# Patient Record
Sex: Female | Born: 1940 | Race: Black or African American | Hispanic: No | Smoking: Former smoker
Health system: Southern US, Community
[De-identification: ages and names within clinical notes are randomized; demographics above are authoritative.]

## PROBLEM LIST (undated history)

## (undated) DIAGNOSIS — F0280 Dementia in other diseases classified elsewhere without behavioral disturbance: Secondary | ICD-10-CM

## (undated) DIAGNOSIS — I10 Essential (primary) hypertension: Secondary | ICD-10-CM

## (undated) DIAGNOSIS — Z5189 Encounter for other specified aftercare: Secondary | ICD-10-CM

## (undated) DIAGNOSIS — F419 Anxiety disorder, unspecified: Secondary | ICD-10-CM

## (undated) DIAGNOSIS — C50919 Malignant neoplasm of unspecified site of unspecified female breast: Secondary | ICD-10-CM

## (undated) DIAGNOSIS — I639 Cerebral infarction, unspecified: Secondary | ICD-10-CM

## (undated) DIAGNOSIS — E039 Hypothyroidism, unspecified: Secondary | ICD-10-CM

## (undated) DIAGNOSIS — E669 Obesity, unspecified: Secondary | ICD-10-CM

## (undated) DIAGNOSIS — I499 Cardiac arrhythmia, unspecified: Secondary | ICD-10-CM

## (undated) DIAGNOSIS — R011 Cardiac murmur, unspecified: Secondary | ICD-10-CM

## (undated) DIAGNOSIS — R51 Headache: Secondary | ICD-10-CM

## (undated) DIAGNOSIS — G309 Alzheimer's disease, unspecified: Secondary | ICD-10-CM

## (undated) DIAGNOSIS — M1990 Unspecified osteoarthritis, unspecified site: Secondary | ICD-10-CM

## (undated) DIAGNOSIS — G301 Alzheimer's disease with late onset: Secondary | ICD-10-CM

## (undated) DIAGNOSIS — J189 Pneumonia, unspecified organism: Secondary | ICD-10-CM

## (undated) DIAGNOSIS — R0602 Shortness of breath: Secondary | ICD-10-CM

## (undated) DIAGNOSIS — K219 Gastro-esophageal reflux disease without esophagitis: Secondary | ICD-10-CM

## (undated) DIAGNOSIS — D649 Anemia, unspecified: Secondary | ICD-10-CM

## (undated) DIAGNOSIS — F0390 Unspecified dementia without behavioral disturbance: Secondary | ICD-10-CM

## (undated) DIAGNOSIS — M5136 Other intervertebral disc degeneration, lumbar region: Secondary | ICD-10-CM

## (undated) DIAGNOSIS — N189 Chronic kidney disease, unspecified: Secondary | ICD-10-CM

## (undated) DIAGNOSIS — F0281 Dementia in other diseases classified elsewhere with behavioral disturbance: Secondary | ICD-10-CM

## (undated) DIAGNOSIS — J069 Acute upper respiratory infection, unspecified: Secondary | ICD-10-CM

## (undated) DIAGNOSIS — M51369 Other intervertebral disc degeneration, lumbar region without mention of lumbar back pain or lower extremity pain: Secondary | ICD-10-CM

## (undated) DIAGNOSIS — IMO0001 Reserved for inherently not codable concepts without codable children: Secondary | ICD-10-CM

## (undated) DIAGNOSIS — I209 Angina pectoris, unspecified: Secondary | ICD-10-CM

## (undated) HISTORY — DX: Malignant neoplasm of unspecified site of unspecified female breast: C50.919

## (undated) HISTORY — DX: Gastro-esophageal reflux disease without esophagitis: K21.9

## (undated) HISTORY — PX: UTERINE FIBROID SURGERY: SHX826

---

## 2002-02-14 ENCOUNTER — Emergency Department (HOSPITAL_COMMUNITY): Admission: EM | Admit: 2002-02-14 | Discharge: 2002-02-14 | Payer: Self-pay | Admitting: Emergency Medicine

## 2002-02-14 ENCOUNTER — Encounter: Payer: Self-pay | Admitting: Emergency Medicine

## 2007-02-14 ENCOUNTER — Emergency Department (HOSPITAL_COMMUNITY): Admission: EM | Admit: 2007-02-14 | Discharge: 2007-02-14 | Payer: Self-pay | Admitting: Emergency Medicine

## 2007-03-13 ENCOUNTER — Emergency Department (HOSPITAL_COMMUNITY): Admission: EM | Admit: 2007-03-13 | Discharge: 2007-03-13 | Payer: Self-pay | Admitting: Emergency Medicine

## 2007-07-02 ENCOUNTER — Encounter (INDEPENDENT_AMBULATORY_CARE_PROVIDER_SITE_OTHER): Payer: Self-pay | Admitting: Obstetrics and Gynecology

## 2007-07-02 ENCOUNTER — Ambulatory Visit: Payer: Self-pay | Admitting: Family Medicine

## 2007-07-02 ENCOUNTER — Ambulatory Visit: Payer: Self-pay | Admitting: Vascular Surgery

## 2007-07-02 ENCOUNTER — Inpatient Hospital Stay (HOSPITAL_COMMUNITY): Admission: EM | Admit: 2007-07-02 | Discharge: 2007-07-11 | Payer: Self-pay | Admitting: Emergency Medicine

## 2007-07-03 ENCOUNTER — Encounter: Payer: Self-pay | Admitting: Family Medicine

## 2007-07-23 ENCOUNTER — Ambulatory Visit: Payer: Self-pay | Admitting: Psychiatry

## 2008-03-04 ENCOUNTER — Emergency Department (HOSPITAL_COMMUNITY): Admission: EM | Admit: 2008-03-04 | Discharge: 2008-03-04 | Payer: Self-pay | Admitting: Emergency Medicine

## 2008-04-09 ENCOUNTER — Emergency Department (HOSPITAL_COMMUNITY): Admission: EM | Admit: 2008-04-09 | Discharge: 2008-04-09 | Payer: Self-pay | Admitting: Emergency Medicine

## 2008-04-21 ENCOUNTER — Inpatient Hospital Stay (HOSPITAL_COMMUNITY): Admission: EM | Admit: 2008-04-21 | Discharge: 2008-04-28 | Payer: Self-pay | Admitting: Emergency Medicine

## 2008-04-29 ENCOUNTER — Encounter (HOSPITAL_COMMUNITY): Admission: RE | Admit: 2008-04-29 | Discharge: 2008-07-28 | Payer: Self-pay | Admitting: Internal Medicine

## 2008-10-30 ENCOUNTER — Emergency Department (HOSPITAL_COMMUNITY): Admission: EM | Admit: 2008-10-30 | Discharge: 2008-10-30 | Payer: Self-pay | Admitting: Emergency Medicine

## 2009-06-28 ENCOUNTER — Emergency Department (HOSPITAL_COMMUNITY): Admission: EM | Admit: 2009-06-28 | Discharge: 2009-06-28 | Payer: Self-pay | Admitting: Emergency Medicine

## 2009-07-02 ENCOUNTER — Inpatient Hospital Stay (HOSPITAL_COMMUNITY): Admission: EM | Admit: 2009-07-02 | Discharge: 2009-07-06 | Payer: Self-pay | Admitting: Emergency Medicine

## 2009-07-03 ENCOUNTER — Encounter (INDEPENDENT_AMBULATORY_CARE_PROVIDER_SITE_OTHER): Payer: Self-pay | Admitting: Internal Medicine

## 2009-07-03 ENCOUNTER — Ambulatory Visit: Payer: Self-pay | Admitting: Surgery

## 2010-07-20 ENCOUNTER — Encounter (HOSPITAL_COMMUNITY): Admission: RE | Admit: 2010-07-20 | Discharge: 2010-09-12 | Payer: Self-pay | Admitting: Cardiology

## 2010-09-16 ENCOUNTER — Emergency Department (HOSPITAL_COMMUNITY)
Admission: EM | Admit: 2010-09-16 | Discharge: 2010-09-16 | Payer: Self-pay | Source: Home / Self Care | Admitting: Emergency Medicine

## 2011-01-14 ENCOUNTER — Encounter: Payer: Self-pay | Admitting: Sports Medicine

## 2011-03-09 LAB — URINE MICROSCOPIC-ADD ON

## 2011-03-09 LAB — URINALYSIS, ROUTINE W REFLEX MICROSCOPIC
Bilirubin Urine: NEGATIVE
Glucose, UA: NEGATIVE mg/dL
Hgb urine dipstick: NEGATIVE
Ketones, ur: NEGATIVE mg/dL
Protein, ur: NEGATIVE mg/dL
pH: 7.5 (ref 5.0–8.0)

## 2011-03-09 LAB — URINE CULTURE
Colony Count: >=100000
Culture  Setup Time: 201109230910

## 2011-03-09 LAB — DIFFERENTIAL
Eosinophils Absolute: 0.5 10*3/uL (ref 0.0–0.7)
Eosinophils Relative: 6 % — ABNORMAL HIGH (ref 0–5)
Lymphocytes Relative: 21 % (ref 12–46)
Lymphs Abs: 1.8 10*3/uL (ref 0.7–4.0)
Monocytes Absolute: 0.5 10*3/uL (ref 0.1–1.0)

## 2011-03-09 LAB — CBC
HCT: 34.6 % — ABNORMAL LOW (ref 36.0–46.0)
MCH: 28.1 pg (ref 26.0–34.0)
MCV: 88.3 fL (ref 78.0–100.0)
RBC: 3.92 MIL/uL (ref 3.87–5.11)
WBC: 8.3 10*3/uL (ref 4.0–10.5)

## 2011-03-09 LAB — POCT I-STAT, CHEM 8
Calcium, Ion: 1.17 mmol/L (ref 1.12–1.32)
Creatinine, Ser: 1.5 mg/dL — ABNORMAL HIGH (ref 0.4–1.2)
Glucose, Bld: 135 mg/dL — ABNORMAL HIGH (ref 70–99)
Hemoglobin: 11.9 g/dL — ABNORMAL LOW (ref 12.0–15.0)
Potassium: 3.6 mEq/L (ref 3.5–5.1)
TCO2: 24 mmol/L (ref 0–100)

## 2011-04-02 LAB — URINALYSIS, ROUTINE W REFLEX MICROSCOPIC
Bilirubin Urine: NEGATIVE
Glucose, UA: NEGATIVE mg/dL
Hgb urine dipstick: NEGATIVE
Hgb urine dipstick: NEGATIVE
Ketones, ur: NEGATIVE mg/dL
Protein, ur: NEGATIVE mg/dL
Specific Gravity, Urine: 1.012 (ref 1.005–1.030)
Specific Gravity, Urine: 1.015 (ref 1.005–1.030)
Urobilinogen, UA: 0.2 mg/dL (ref 0.0–1.0)
Urobilinogen, UA: 0.2 mg/dL (ref 0.0–1.0)

## 2011-04-02 LAB — DIFFERENTIAL
Basophils Absolute: 0 10*3/uL (ref 0.0–0.1)
Eosinophils Absolute: 0.4 10*3/uL (ref 0.0–0.7)
Eosinophils Absolute: 0.6 10*3/uL (ref 0.0–0.7)
Eosinophils Relative: 5 % (ref 0–5)
Eosinophils Relative: 8 % — ABNORMAL HIGH (ref 0–5)
Lymphocytes Relative: 11 % — ABNORMAL LOW (ref 12–46)
Lymphs Abs: 1.2 10*3/uL (ref 0.7–4.0)

## 2011-04-02 LAB — COMPREHENSIVE METABOLIC PANEL
ALT: 13 U/L (ref 0–35)
AST: 18 U/L (ref 0–37)
CO2: 27 mEq/L (ref 19–32)
Calcium: 9.5 mg/dL (ref 8.4–10.5)
Chloride: 102 mEq/L (ref 96–112)
GFR calc Af Amer: 31 mL/min — ABNORMAL LOW (ref >60)
GFR calc non Af Amer: 25 mL/min — ABNORMAL LOW (ref >60)
Sodium: 140 mEq/L (ref 135–145)
Total Bilirubin: 0.3 mg/dL (ref 0.3–1.2)

## 2011-04-02 LAB — CULTURE, BLOOD (ROUTINE X 2): Culture: NO GROWTH

## 2011-04-02 LAB — TSH: TSH: 3.277 u[IU]/mL (ref 0.350–4.500)

## 2011-04-02 LAB — CBC
HCT: 25.2 % — ABNORMAL LOW (ref 36.0–46.0)
HCT: 25.8 % — ABNORMAL LOW (ref 36.0–46.0)
HCT: 27.7 % — ABNORMAL LOW (ref 36.0–46.0)
Hemoglobin: 8.6 g/dL — ABNORMAL LOW (ref 12.0–15.0)
Hemoglobin: 8.9 g/dL — ABNORMAL LOW (ref 12.0–15.0)
MCHC: 33.3 g/dL (ref 30.0–36.0)
MCHC: 33.4 g/dL (ref 30.0–36.0)
MCHC: 33.5 g/dL (ref 30.0–36.0)
MCV: 87.3 fL (ref 78.0–100.0)
MCV: 87.7 fL (ref 78.0–100.0)
MCV: 87.8 fL (ref 78.0–100.0)
Platelets: 250 10*3/uL (ref 150–400)
Platelets: 296 10*3/uL (ref 150–400)
RBC: 2.96 MIL/uL — ABNORMAL LOW (ref 3.87–5.11)
RBC: 3.07 MIL/uL — ABNORMAL LOW (ref 3.87–5.11)
RBC: 3.15 MIL/uL — ABNORMAL LOW (ref 3.87–5.11)
RDW: 14.2 % (ref 11.5–15.5)
RDW: 14.6 % (ref 11.5–15.5)
WBC: 7.6 10*3/uL (ref 4.0–10.5)
WBC: 8.8 10*3/uL (ref 4.0–10.5)

## 2011-04-02 LAB — BASIC METABOLIC PANEL
BUN: 31 mg/dL — ABNORMAL HIGH (ref 6–23)
CO2: 26 mEq/L (ref 19–32)
CO2: 26 mEq/L (ref 19–32)
CO2: 29 mEq/L (ref 19–32)
Calcium: 9.4 mg/dL (ref 8.4–10.5)
Calcium: 9.6 mg/dL (ref 8.4–10.5)
Chloride: 108 mEq/L (ref 96–112)
Chloride: 110 mEq/L (ref 96–112)
Creatinine, Ser: 1.27 mg/dL — ABNORMAL HIGH (ref 0.4–1.2)
Creatinine, Ser: 1.94 mg/dL — ABNORMAL HIGH (ref 0.4–1.2)
GFR calc Af Amer: 48 mL/min — ABNORMAL LOW (ref >60)
GFR calc Af Amer: 51 mL/min — ABNORMAL LOW (ref >60)
Glucose, Bld: 91 mg/dL (ref 70–99)
Potassium: 3.6 mEq/L (ref 3.5–5.1)
Potassium: 4.5 mEq/L (ref 3.5–5.1)
Sodium: 140 mEq/L (ref 135–145)
Sodium: 141 mEq/L (ref 135–145)

## 2011-04-02 LAB — URINE MICROSCOPIC-ADD ON

## 2011-04-02 LAB — POCT I-STAT, CHEM 8
HCT: 28 % — ABNORMAL LOW (ref 36.0–46.0)
Hemoglobin: 9.5 g/dL — ABNORMAL LOW (ref 12.0–15.0)
Sodium: 140 mEq/L (ref 135–145)
TCO2: 26 mmol/L (ref 0–100)

## 2011-04-02 LAB — URINE CULTURE: Culture: NO GROWTH

## 2011-04-02 LAB — PROTIME-INR: Prothrombin Time: 14.2 seconds (ref 11.6–15.2)

## 2011-05-09 NOTE — Discharge Summary (Signed)
NAMEMASIAH, LEWING                   ACCOUNT NO.:  [id number]   MEDICAL RECORD NO.:  [id number]          PATIENT TYPE:  INP   LOCATION:  5150                         FACILITY:  [facility]   PHYSICIAN:  Theodosia Paling, MD    DATE OF BIRTH:  06-Nov-1941   DATE OF ADMISSION:  07/02/2009  DATE OF DISCHARGE:  07/06/2009                               DISCHARGE SUMMARY   PRIMARY CARE PHYSICIAN:  Della Goo, M.D.   ADMITTING HISTORY:  Please refer to her excellent admission note  dictated under history of present illness.   DISCHARGE DIAGNOSES:  1. Acute renal insufficiency, currently resolving.  2. Possible gout.  3. Hypertension.   SECONDARY DIAGNOSES:  1. History of arthritis.  2. History of hypertension.  3. History of gastroesophageal reflux disease.  4. History of dementia.   PROCEDURE PERFORMED:  None.   CONSULTATION PERFORMED:  None.   IMAGING PERFORMED:  1. X-ray of the bilateral feet showing no acute bony pathology or      chronic changes.  2. Bilateral venous duplex showing negative for DVT.   HOSPITAL COURSE:  Following issues were addressed during the  hospitalization.  1. Hypertension.  The patient was admitted for hypertension.      Panculture was performed which was actually negative.  Assessment      was most likely it is a combination of dehydration and Lasix and      other antihypertensive medication.  The patient's blood pressure is      now stable at 129.  At the time of discharge it is 129/80 and the      only antihypertensive on board is metoprolol currently.  The      patient did not have any complication from hypertension on day #1.  2. Gout.  The patient developed bilateral swelling in right foot, in      particular was very warm and swollen and hot.  Therefore,      presumptive diagnosis of gout was made.  X-ray was performed on the      patient's request, which was negative for any bony pathology.  The      patient will be finishing the tapering of  prednisone.  She will      follow up with Dr. Della Goo for further clinical followup.  3. Acute renal insufficiency.  The patient on admission had a      creatinine of 1.9 which is down to 1.3 today.  She did not have any      electrolyte or volume disturbances, most likely it was a      combination of NSAID, Lasix and ACE inhibitor.  Lasix and ACE      inhibitor are on hold for now.  So, the patient will get a followup      BNP in 4 days' time.  She is instructed to call Dr. Lovell Sheehan.   DISPOSITION:  The patient will follow with Dr. Della Goo in 4  days' time with a followup of basic metabolic panel.   Total time spent in discharge of this patient around  1 hour.      Theodosia Paling, MD  Electronically Signed     NP/MEDQ  D:  07/06/2009  T:  07/07/2009  Job:  [id number]   cc:   Della Goo, M.D.

## 2011-05-09 NOTE — Discharge Summary (Signed)
Katherine Cooper, Katherine Cooper                   ACCOUNT NO.:  [id number]   MEDICAL RECORD NO.:  [id number]          PATIENT TYPE:  WOC   LOCATION:  WOC                          FACILITY:  [facility]   PHYSICIAN:  Ardeen Garland, MD       DATE OF BIRTH:  1941-11-03   DATE OF ADMISSION:  07/02/2007  DATE OF DISCHARGE:  07/10/2007                               DISCHARGE SUMMARY   Date of Admission: July 02, 2007  Date of Discharge: July 17th, 2008   PRIMARY CARE PHYSICIAN:  Dr. Pecola Leisure at the [facility].   CONSULTANTS:  Dr. Electa Sniff of Psychiatry.   REASON FOR ADMISSION:  Altered mental status and acute renal failure.   DISCHARGE DIAGNOSES:  1. Dementia.  2. Acute renal failure.  3. Anemia.  4. Hypercalcemia.   DISCHARGE MEDICATIONS:  1. Aspirin 325 mg daily.  2. Protonix 40 mg daily.  3. Colace 100 mg daily.  4. Lorazepam 0.5 mg at bedtime for 7 days and eventually stopped.  5. Multivitamin daily.   HOSPITAL COURSE:  The patient was admitted on July 02, 2007, for acute  altered mental status.  She is a 70 year old, African-American woman  with a past medical history of hypertension, tobacco abuse, and alcohol  abuse who was brought to the Emergency Room by her family with 3 days of  altered mental status and slurred speech.  She was also believed to have  a left-sided facial droop, and her family states that she had been very  forgetful and been saying things that made no sense.  In the ED, she  claimed to be seeing bugs crawling on the wall.  Her family says prior  to this episode that she was very sharp and really had no history of  dementia.  She has not been recently ill and had not recently started  any medications.  She had been taking NSAIDs for arthritis.  She did  have a heavy drinking history, a 12-pack of beer over 1-2 days; however,  she had quit 3-4 weeks prior to admission to the hospital for an unknown  reason.  She had no previous history of alcohol withdrawal  or DTs.  On  admission, her hemoglobin was 10.7.  White blood cells were 8.  Her PT  was 14.6, INR 1.1.  Her potassium was 3.2, and her creatinine was found  to be 3.67.  A UDS was positive for benzos.  She had apparently been  taking diazepam for an unknown reason.  During her hospital stay, she  received fluids and a Foley to help correct her acute renal failure.  She was also given potassium to correct her low potassium.  She  continued to remain delirious for several days, waxing and waning  generally becoming combative and confused in the late afternoon.  She  was requiring p.r.n. Haldol in the afternoon and did have to be  restrained twice in the early days of her hospitalization.  Though she  had a past history of hypertension, the family did not bring any pill  bottles  with hypertensive medicines in them, and so, we did not start  any while she was at the hospital.  Her Foley was discontinued on July 04, 2007.  We had finished taking the 24-hour urine creatinine, and her  creatinine had been resolving.  On the 9th, it was down to 1.67.  On the  10th, down to 1.23.  It was 1.09 on the 14th and 1.06 on the day of  discharge.  She was also found to have low albumin and a calcium of  10.6.  10.6 in itself is a mildly high calcium, however, if you correct  for her low albumin, her calcium is even higher in the mid 11s.  A PTH  was drawn which was normal.  An ionized calcium was elevated.  A  calcitriol which is 125-dihydroxyvitamin D3 level is pending at this  time.  Her delirium did seem to resolve by about the morning of July 05, 2007.  The regimen was changed to only receiving Haldol as needed in the  evening for sundowning, and Ativan was given only at night.  A psych  consult was ordered, and psychiatry came to see her on July 05, 2007.  It was Dr. Electa Sniff.  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  He did not recommend any new meds for her at this time other  than what we already had her on.  He also thought her acute delirium  could be related to acute alcohol or benzodiazepine withdrawal and  stated that if that were the case it would be resolving about that time  which it was.  She remained to be in fairly stable mental status with  some new onset baseline dementia.  Her speech became much clearer though  she did have a left facial droop in the ER that went away, and a CT was  done which did not show any sign of stroke.  She is oriented to person,  place, and time for the last 3 days.  However, she does occasionally  still tell stories that do not make sense or seem to be inappropriate  for the time.  This leads [place] to believe she may have some new onset  baseline dementia.  Her condition at the time of discharge is stable.  She did have a fever 1 day prior to discharge to 101.9.  A UA was drawn  but nothing suspicious was seen, and the patient was asymptomatic.  She  has been afebrile now overnight and is in good spirits feeling well.  No  antibiotic will be prescribed at this time.  The patient will take  Tylenol if she should get a mild temperature after discharge.  Pending  tests at the time of discharge, we will order a set of a.m. labs this  morning as she had not had them drawn yet just to make sure white blood  cells are not trending up from the fever yesterday, and a BMP to check a  final creatinine before she leaves.   DISPOSITION:  She was discharged home with her family.  She was unable  to be placed in an ALF as all that came to evaluate her denied her  secondary to her drinking history.  She will be receiving home health  PT/OT and home health social work, who will be assisting the family in  continuing to find an assisted living facility  for her.   DISCHARGE FOLLOWUP:  She does have an appointment with her primary care  physician, Dr. Pecola Leisure, at the  [facility] for  Wednesday, July 30, at 11 a.m.   FOLLOWUP ISSUES:  Due to her elevated calcium in the hospital, we were  worried if it is primary hypoparathyroid versus a malignancy, we would  suggest that a calcium, phosphorus, magnesium, and a PTH level be  redrawn at the time of her followup to further clarify the reason for  her hypocalcemia.  A CBC and BMP would also be suggested to check the  status of her hemoglobin and renal function.  Hemoglobin stayed stable  in the mid 8 throughout her hospital stay, and renal function had  improved to baseline at the time of discharge.      Ardeen Garland, MD  Electronically Signed     LM/MEDQ  D:  07/10/2007  T:  07/10/2007  Job:  [id number]   cc:   Jocelyn Lamer D. Pecola Leisure, M.D.  Anselm Jungling, MD

## 2011-05-09 NOTE — Discharge Summary (Signed)
NAMEJASHAYLA, Katherine Cooper                   ACCOUNT NO.:  [id number]   MEDICAL RECORD NO.:  [id number]          PATIENT TYPE:  INP   LOCATION:  5508                         FACILITY:  [facility]   PHYSICIAN:  Betti D. Pecola Leisure, M.D.   DATE OF BIRTH:  May 02, 1941   DATE OF ADMISSION:  04/21/2008  DATE OF DISCHARGE:  04/28/2008                               DISCHARGE SUMMARY   DISCHARGE DIAGNOSES:  1. Dementia.  2. Hypertension.  3. Congestive heart failure.  4. Low back pain.  5. Anemia.   ADMISSION DIAGNOSES:  1. Confusion.  2. Dementia.  3. Hypertension.  4. Anemia.   REASON FOR ADMISSION:  A 70 year old African American female with  history of hypertension, CHF, lower back pain and dementia for the past  year to year and a half.  Was brought to the [facility] for  increasing confusion.  The patient's family thought that she was at  increased risk for hurting herself at home.  Her dementia symptoms had  worsened and they became concerned and had no other place to take her.  The patient has had several episodes of falling at home without any  known injury to the cranium or any other area of her body.  She had had  no colds, no other acute illnesses.  The patient was admitted to [facility] for nursing home placement.   REVIEW OF SYSTEMS:  Noncontributory.   Her medical history consisted of those things mentioned above.   PAST SURGICAL HISTORY:  Noncontributory.   MEDICATIONS:  1. Ibuprofen 800 mg.  2. Zantac over-the-counter.  3. Diovan/HCT 160/12.5 mg once a day.  4. Toprol XL 100 mg once a day.  5. Lasix 20 mg once a day.  6. Clorazepate 7.5 mg three times daily.  7. Aricept 10 mg one daily.  8. Seroquel 25 mg at bedtime.  9. Again, she was on fexofenadine 60 mg daily.   The patient's hospital course was unremarkable.  The patient did show  some confusion at the onset of her hospitalization in the first 24-36  hours.  That condition did improve.  Her mental cognition did  improve  after 2 units of transfused packed red blood cells, IV fluids and proper  nourishment.  Her admission hemoglobin was 7.8% and declined to 6.4.  At  that time the patient was transfused 2 units of packed red blood cells.  She had no breathing difficulty, no chest pain or other issues at that  time.  The patient did have known renal insufficiency with an elevated  creatinine.  Upon admission her creatinine was 3.38, followed by a  lowering of that creatinine with IV rehydration to a value more normal  of 1.73, followed by a value of 1.84, followed by a value of 1.25.  The  patient did have a noted temperature, which her T-max during this  hospitalization was 101.5, which was treated with Tylenol.  Blood  cultures were obtained x2, which shows no evidence of any growth for the  past 48 hours.  The patient's urine was not significant  for any urinary  tract infection as well.  Family did decide on a place for long-term  care, which the patient will start off at [facility].  On her  discharge from the hospital the patient is stable.  Mental cognition  appears to be stable as well.  Her vitals are stable with a systolic  blood pressure of 130/83, a heart rate of 87, respirations 16,  temperature of 98.2.  FAO2 is 99% on room air.  The patient has a  hemoglobin of 8.2 and a hematocrit of 24.5.  Sodium today is 139,  potassium 3.5, chloride is 113, carbon dioxide is 21, BUN is 6,  creatinine is 1.06, glucose 82.  Calcium is 9.1.  The patient will be  discharged to [facility] on today.  She is to be followed up  with physical therapy while at [facility].  If there are any  primary care physicians who are contracted with that facility, she is to  be serviced by them as well.           ______________________________  Isidoro Donning. Pecola Leisure, M.D.     BDR/MEDQ  D:  04/28/2008  T:  04/28/2008  Job:  [id number]

## 2011-05-09 NOTE — H&P (Signed)
Katherine Cooper, Katherine Cooper                   ACCOUNT NO.:  [id number]   MEDICAL RECORD NO.:  [id number]          PATIENT TYPE:  OBV   LOCATION:  1843                         FACILITY:  [facility]   PHYSICIAN:  Della Goo, M.D. DATE OF BIRTH:  [birth year]   DATE OF ADMISSION:  07/02/2009  DATE OF DISCHARGE:                              HISTORY & PHYSICAL   CHIEF COMPLAINT:  Swelling, right lower leg.   HISTORY OF PRESENT ILLNESS:  This is a 70 year old female who was seen  in my office for complaints of increased swelling in her right lower leg  for the past 7 days.  The patient states she also had complaints of  urinary frequency and increased pain.  She went to the emergency  department on June 28, 2009, was seen and evaluated then and diagnosed  with a urinary tract infection.  She was placed on Macrobid therapy  twice daily for 7 days and then was given pain medication to help  control her pain.  The patient was seen in my office today and had  prominent edema 2+ in the right lower leg and also was found to be  hypotensive with blood pressures of 96/60.  The patient was referred to  the emergency department for further workup and evaluation.  The patient  denies having any trauma or recent falls.  She denies having any fevers,  chills.  She denies having any chest pain.   PAST MEDICAL HISTORY:  Significant for hypertension, arthritis,  gastroesophageal reflux disease, dementia.   MEDICATIONS AT THIS TIME:  1. Aricept 10 mg 1 p.o. daily.  2. Namenda 10 mg 1 p.o. b.i.d.  3. Diovan 160/12.5 one p.o. daily.  4. Fexofenadine 60 mg 1 p.o. daily.  5. Furosemide 20 mg 1 p.o. daily.  6. Potassium chloride 20 mEq 1 p.o. daily.  7. Metoprolol tartrate 100 mg 1 p.o. daily.  8. Oxybutynin 5 mg 1 p.o. b.i.d.   ALLERGIES:  SULFA.   SOCIAL HISTORY:  The patient lives alone.  She is a nonsmoker,  nondrinker and no history of illicit drug usage.   FAMILY HISTORY:  Noncontributory.   PHYSICAL  EXAMINATION FINDINGS:  GENERAL:  This is a 70 year old elderly  confused female in discomfort but no acute distress.  VITAL SIGNS:  Temperature 99.3, blood pressure 78/58, heart rate 75,  respirations 12, and O2 saturations 96%.  HEENT:  Normocephalic, atraumatic.  Pupils equally round, reactive to  light.  Extraocular movements are intact.  Funduscopic benign.  Oropharynx is clear.  NECK:  Supple, full range of motion.  No thyromegaly, adenopathy, or  jugular venous distention.  CARDIOVASCULAR:  Regular rate and rhythm.  No murmurs, gallops or rubs.  LUNGS:  Clear to auscultation bilaterally.  ABDOMEN:  Positive bowel sounds, soft, nontender, nondistended.  EXTREMITIES:  Without cyanosis, clubbing except there is 2+ edema on the  right lower extremity.  The edema is pitting.  NEUROLOGIC:  The patient  is mildly confused, but she is alert and oriented x3.  Cranial nerves  are intact.  Motor and sensory function also intact.  ASSESSMENT:  A 70 year old female being admitted with the following:  1. Hypotension.  2. Unilateral edema of the right lower extremity; rule out deep venous      thrombosis.  3. Partially treated urinary tract infection.  4. Anemia.   PLAN:  The patient will be admitted for 24-hour observation.  The  patient will be hydrated with IV fluids, and a workup to rule out a deep  venous thrombosis has been initiated.  The patient will be sent for a  venous duplex ultrasound of her right lower leg to evaluate for possible  deep venous thrombosis.  Her blood pressure medications will be held  secondary to her hypotension, and fluid resuscitation has been ordered.  The patient will be placed on DVT prophylaxis for now.  However, this  will be changed if the results return positive for deep venous  thrombosis.  GI prophylaxis has also been ordered.  The patient's  medications will be further reviewed and continued otherwise except for  her blood pressure medications for  now.      Della Goo, M.D.  Electronically Signed     HJ/MEDQ  D:  07/02/2009  T:  07/02/2009  Job:  [id number]

## 2011-05-09 NOTE — H&P (Signed)
NAMEAMELIYA, Katherine Cooper                   ACCOUNT NO.:  [id number]   MEDICAL RECORD NO.:  [id number]          PATIENT TYPE:  INP   LOCATION:  2034                         FACILITY:  [facility]   PHYSICIAN:  Santiago Bumpers. Hensel, M.D.DATE OF BIRTH:  18-Mar-1941   DATE OF ADMISSION:  07/02/2007  DATE OF DISCHARGE:                              HISTORY & PHYSICAL   DATE OF ADMISSION:  July 02, 2007   PRIMARY CARE PHYSICIAN:  Betti D. Pecola Leisure, M.D., and she will be admitted  to the [facility] Medicine Teaching Service as an unassigned patient.   CHIEF COMPLAINT:  Slurred speech and altered mental status.   HISTORY OF PRESENT ILLNESS:  The patient is 70 year old African American  female with past medical history of hypertension, tobacco abuse and  alcohol abuse who presented to the emergency department today with 3  days of altered mental status and slurred speech.  She was also noted to  have a left-sided facial droop today.  Over the past 3 days, she been  very forgetful, such as not remembering where she put her dentures and  has been saying things that according to her grandson and daughter that  did not make any sense.  In the emergency department tonight, she says  she was seeing bugs on the walls.  Her family says that she is normally  very sharp and has no history of dementia.  No recent illnesses her new  medications.  She does take Vicodin and ibuprofen for arthritis as well  as diazepam for reasons unknown to her daughter.  She also has a history  of heavy drinking, drinking a 12-pack of beer every 1-2 days, although  she states she quit 3 or 4 weeks ago.  She denies any other drug use.  She has no history of alcohol withdrawal or DTs.  She denies any  headaches, blurry vision, difficulty swallowing, or focal weakness.  She  normally ambulates with a cane, but her grandson and daughter do note  that she has been having more falls in the past couple of months.   PAST MEDICAL HISTORY:  1. GERD.  2. Hypertension.  3. Arthritis in the low back and knees.   HOME MEDICATIONS:  1. Clorazepate 7.5 mg t.i.d.  2. Ibuprofen 100 mg p.o. p.r.n.  3. Pepcid 20 mg p.o. b.i.d.  4. Vicodin 5/500 p.r.n.  5. Diazepam 5 mg p.r.n.  6. Medications for hypertension.  The daughters does not have those      with her currently.  She is uncertain what they are.   ALLERGIES:  SULFA.   FAMILY HISTORY:  Two brothers with lung cancer, one brother with stomach  cancer, one brother with prostate cancer.  Mom with diabetes.   SOCIAL HISTORY:  She lives with her grandson.  Her daughter, [name],  also lives in the area.  She is widowed for the past 41 years, as her  husband died in a MVA.  She has five children and many grandchildren.  Alcohol use as above.  She does have an extensive smoking history but  quit 7  months ago.  She denies any other drugs.  She is a retired  Copy.   REVIEW OF SYSTEMS:  Positive chest pain which she mentioned to her son  once, although she currently is denying this.  She denies any shortness  of breath, nausea, vomiting, diarrhea, lightheadedness, or fevers.  The  remainder as per HPI.   PHYSICAL EXAMINATION:  VITAL SIGNS:  Temperature 99.1, pulse 89, blood  pressure 119/82, respiratory rate 20, 97% on room air.  GENERAL:  She is alert and oriented x3.  She has difficulty remaining  focused, and her speech is somewhat slurred.  HEENT:  Pupils equally round and reactive to light and accommodation.  Extraocular motions intact.  Mucous membranes are moist.  No facial  droop noted.  NECK:  No carotid bruits.  CARDIOVASCULAR:  Regular rate and rhythm.  No murmurs, rubs or gallops.  LUNGS:  Clear to auscultation bilaterally.  ABDOMEN:  Positive bowel sounds, soft, nontender, nondistended.  No  hepatosplenomegaly.  EXTREMITIES:  No cyanosis, clubbing, or edema.  2+ dorsalis pedis  pulses.  NEUROLOGIC:  Alert and oriented x3.  Cranial nerves II-XII grossly  intact.  5/5  strength throughout.  Finger-to-nose intact.  Sensation  intact to light touch.  2+ DTRs.  Negative Romberg, negative asterixis.  Positive dysarthria.  Normal bedside swallow evaluation.   LABORATORY DATA:  White blood cell count 8.0, hemoglobin 10.2,  hematocrit 30.3, MCV 84.  PT 14.6, INR 1.1, PTT 36.  Sodium 140,  potassium 3.2, chloride 108, bicarb 19, BUN 55, creatinine 3.67, glucose  84, AST 31, ALT 19, total protein 88.0, albumin 3.6.  CK 272, CK-MB 2.9,  troponin 0.02.  Urinalysis negative for nitrite, leukocyte esterase,  protein, and blood.  Urine drug screen positive for benzodiazepine.   IMAGING:  CT head showed mild atrophy but no acute intracranial  abnormalities.  Chest x-ray showed borderline heart size, moderate  hiatal hernia, no acute findings.  EKG showed normal sinus rhythm  without any ST-segment changes.   ASSESSMENT/PLAN:  70 year old female with confusion and dysarthria.  1. Altered mental status.  Concern for cerebrovascular accident with      dysarthria and questionable history of facial droop.  She does have      a significant alcohol history as well as taking medications such as      Vicodin and diazepam that can cause confusion.  Family is unaware      of how much she has been taking.  Will check 2-D echocardiogram,      carotid Dopplers, and consider MRI to complete cerebrovascular      accident workup.  Will risk stratify her by checking fasting lipid      panel.  We will start aspirin.  We will also check TSH and      pneumonia to rule out other causes of altered mental status.  2. Acute renal failure.  The patient gives no history of renal      failure.  Question what her underlying baseline is.  She has been      taking a lot of ibuprofen, although her daughter is uncertain how      much.  Urinalysis is negative for protein and blood.  Will check a      FENa and follow strict ins and outs.  Foley catheter is in place.      If her renal function does  not improve quickly, then she will      likely need a  renal ultrasound as well as chronic renal disease      workup.  3. Anemia.  Stool Hemoccult is currently pending.  Will check iron      panel, folate, B12, and ferritin.  Possibly anemia of chronic      disease, particularly if renal failure is chronic.  4. Chest pain.  The patient denies any chest pain currently.  She had      normal EKG and one set of normal enzymes.  Will check a second set      of cardiac enzymes to rule out for myocardial infarction.  5. Fluids and electrolytes.  Normal bedside swallow.  Will start renal      diet and follow potassium.  6. Gastroesophageal reflux disease/hiatal hernia.  We will Protonix      while here in the hospital.  7. Frequent falls.  Will consult physical therapy and occupational      therapy.  8. Alcohol abuse.  Start thiamine and multivitamin.  Will check      alcohol level.  The patient states she has quit and does      rehabilitation at this point.  9. Prophylaxis.  Use Lovenox for deep venous thrombosis prophylaxis.  10.Hypertension.  Will obtain the patient's home blood pressure      medication regimen in the morning.      Benn Moulder, M.D.  Electronically Signed      Santiago Bumpers. Leveda Anna, M.D.  Electronically Signed    MR/MEDQ  D:  07/02/2007  T:  07/02/2007  Job:  [id number]   cc:   Jocelyn Lamer D. Pecola Leisure, M.D.

## 2011-09-18 LAB — URINE MICROSCOPIC-ADD ON

## 2011-09-18 LAB — URINALYSIS, ROUTINE W REFLEX MICROSCOPIC
Bilirubin Urine: NEGATIVE
Glucose, UA: NEGATIVE
Hgb urine dipstick: NEGATIVE
Protein, ur: NEGATIVE
Urobilinogen, UA: 0.2

## 2011-09-18 LAB — URINE CULTURE: Colony Count: 35000

## 2011-09-19 LAB — IRON AND TIBC
Iron: <10 — ABNORMAL LOW
UIBC: 270

## 2011-09-19 LAB — BASIC METABOLIC PANEL
BUN: 26 — ABNORMAL HIGH
CO2: 21
CO2: 26
Calcium: 10.2
Chloride: 105
Chloride: 111
Creatinine, Ser: 1.73 — ABNORMAL HIGH
GFR calc Af Amer: 17 — ABNORMAL LOW
GFR calc Af Amer: 36 — ABNORMAL LOW
GFR calc non Af Amer: 29 — ABNORMAL LOW
Glucose, Bld: 105 — ABNORMAL HIGH
Glucose, Bld: 118 — ABNORMAL HIGH
Potassium: 4
Sodium: 139
Sodium: 143

## 2011-09-19 LAB — DIFFERENTIAL
Basophils Absolute: 0.1
Basophils Relative: 1
Eosinophils Absolute: 0.1
Eosinophils Absolute: 0.6
Eosinophils Relative: 2
Eosinophils Relative: 7 — ABNORMAL HIGH
Lymphocytes Relative: 12
Monocytes Absolute: 0.4
Monocytes Relative: 5

## 2011-09-19 LAB — COMPREHENSIVE METABOLIC PANEL
ALT: <8
AST: 12
CO2: 21
Chloride: 101
Creatinine, Ser: 3.38 — ABNORMAL HIGH
GFR calc Af Amer: 16 — ABNORMAL LOW
GFR calc non Af Amer: 14 — ABNORMAL LOW
Total Bilirubin: 0.7

## 2011-09-19 LAB — CBC
Hemoglobin: 7.8 — CL
Hemoglobin: 8.8 — ABNORMAL LOW
MCHC: 33.1
MCV: 84.2
MCV: 84.5
RBC: 2.69 — ABNORMAL LOW
RBC: 3.16 — ABNORMAL LOW
RDW: 14.2
WBC: 8.1

## 2011-09-19 LAB — URINALYSIS, ROUTINE W REFLEX MICROSCOPIC
Bilirubin Urine: NEGATIVE
Bilirubin Urine: NEGATIVE
Glucose, UA: NEGATIVE
Hgb urine dipstick: NEGATIVE
Hgb urine dipstick: NEGATIVE
Ketones, ur: NEGATIVE
Protein, ur: NEGATIVE
Protein, ur: NEGATIVE
Specific Gravity, Urine: 1.01
Urobilinogen, UA: 0.2

## 2011-09-19 LAB — URINE MICROSCOPIC-ADD ON

## 2011-09-19 LAB — TSH: TSH: 1.009

## 2011-09-26 LAB — DIFFERENTIAL
Basophils Relative: 1
Eosinophils Absolute: 0.4
Lymphs Abs: 2
Neutro Abs: 4.8
Neutrophils Relative %: 62

## 2011-09-26 LAB — BASIC METABOLIC PANEL
BUN: 24 — ABNORMAL HIGH
Chloride: 103
GFR calc Af Amer: 32 — ABNORMAL LOW
GFR calc non Af Amer: 26 — ABNORMAL LOW
Potassium: 3.9

## 2011-09-26 LAB — CBC
HCT: 31.4 — ABNORMAL LOW
Platelets: 281
RBC: 3.58 — ABNORMAL LOW
WBC: 7.9

## 2011-09-26 LAB — OCCULT BLOOD X 1 CARD TO LAB, STOOL: Fecal Occult Bld: NEGATIVE

## 2011-09-26 LAB — URINALYSIS, ROUTINE W REFLEX MICROSCOPIC
Bilirubin Urine: NEGATIVE
Ketones, ur: NEGATIVE
Nitrite: NEGATIVE
Urobilinogen, UA: 0.2

## 2011-09-26 LAB — URINE CULTURE: Colony Count: 8000

## 2011-09-26 LAB — TROPONIN I: Troponin I: <0.01

## 2011-09-26 LAB — CK TOTAL AND CKMB (NOT AT ARMC)
Relative Index: 0.6
Total CK: 109

## 2011-10-09 LAB — CBC
HCT: 25.1 — ABNORMAL LOW
HCT: 25.7 — ABNORMAL LOW
Hemoglobin: 8.6 — ABNORMAL LOW
Hemoglobin: 8.6 — ABNORMAL LOW
MCHC: 33.5
MCV: 85.1
Platelets: 374
RBC: 3.02 — ABNORMAL LOW
RBC: 3.04 — ABNORMAL LOW
RDW: 14.5 — ABNORMAL HIGH

## 2011-10-09 LAB — COMPREHENSIVE METABOLIC PANEL
AST: 16
Albumin: 2.6 — ABNORMAL LOW
Alkaline Phosphatase: 68
BUN: 8
GFR calc Af Amer: >60
Potassium: 4
Sodium: 139
Total Protein: 6.5

## 2011-10-09 LAB — URINALYSIS, ROUTINE W REFLEX MICROSCOPIC
Glucose, UA: NEGATIVE
Ketones, ur: NEGATIVE
pH: 8

## 2011-10-09 LAB — URINE CULTURE

## 2011-10-09 LAB — URINE MICROSCOPIC-ADD ON

## 2011-10-09 LAB — VITAMIN D 1,25 DIHYDROXY: Vit D, 1,25-Dihydroxy: 26 pg/mL (ref 6–62)

## 2011-10-10 LAB — COMPREHENSIVE METABOLIC PANEL
ALT: 16
ALT: 16
ALT: 19
AST: 16
Albumin: 2.5 — ABNORMAL LOW
Alkaline Phosphatase: 53
BUN: 23
BUN: 7
CO2: 19
CO2: 21
CO2: 24
Calcium: 10
Calcium: 10.6 — ABNORMAL HIGH
Calcium: 10.8 — ABNORMAL HIGH
Calcium: 9.8
Creatinine, Ser: 1.67 — ABNORMAL HIGH
Creatinine, Ser: 3.67 — ABNORMAL HIGH
GFR calc Af Amer: 37 — ABNORMAL LOW
GFR calc Af Amer: 51 — ABNORMAL LOW
GFR calc non Af Amer: 12 — ABNORMAL LOW
GFR calc non Af Amer: 31 — ABNORMAL LOW
GFR calc non Af Amer: 39 — ABNORMAL LOW
Glucose, Bld: 100 — ABNORMAL HIGH
Glucose, Bld: 76
Glucose, Bld: 84
Glucose, Bld: 94
Potassium: 3.7
Sodium: 138
Total Bilirubin: 0.5
Total Protein: 6.1
Total Protein: 6.2

## 2011-10-10 LAB — BASIC METABOLIC PANEL
BUN: 10
BUN: 7
CO2: 18 — ABNORMAL LOW
CO2: 23
CO2: 24
CO2: 25
Calcium: 10.5
Calcium: 11 — ABNORMAL HIGH
Calcium: 9.2
Chloride: 108
Creatinine, Ser: 1.11
Creatinine, Ser: 1.23 — ABNORMAL HIGH
Creatinine, Ser: 2.8 — ABNORMAL HIGH
GFR calc Af Amer: 20 — ABNORMAL LOW
GFR calc Af Amer: 60 — ABNORMAL LOW
GFR calc Af Amer: >60
GFR calc non Af Amer: 50 — ABNORMAL LOW
Glucose, Bld: 80
Potassium: 3.7
Sodium: 140

## 2011-10-10 LAB — CBC
HCT: 23.8 — ABNORMAL LOW
HCT: 24.3 — ABNORMAL LOW
HCT: 24.9 — ABNORMAL LOW
Hemoglobin: 8 — ABNORMAL LOW
Hemoglobin: 8.1 — ABNORMAL LOW
Hemoglobin: 8.3 — ABNORMAL LOW
Hemoglobin: 8.6 — ABNORMAL LOW
MCHC: 33.3
MCHC: 33.3
MCHC: 33.5
MCHC: 33.5
MCHC: 33.5
MCHC: 33.6
MCV: 84.1
MCV: 85.1
MCV: 85.1
MCV: 85.3
Platelets: 273
Platelets: 288
Platelets: 297
Platelets: 404 — ABNORMAL HIGH
RBC: 2.8 — ABNORMAL LOW
RBC: 2.82 — ABNORMAL LOW
RBC: 2.84 — ABNORMAL LOW
RBC: 3.02 — ABNORMAL LOW
RDW: 14
RDW: 14.4 — ABNORMAL HIGH
WBC: 7.3

## 2011-10-10 LAB — DIFFERENTIAL
Basophils Absolute: 0.1
Basophils Relative: 0
Eosinophils Absolute: 0.3
Eosinophils Absolute: 0.3
Lymphs Abs: 1.1
Lymphs Abs: 1.6
Monocytes Relative: 8
Neutro Abs: 5.1
Neutrophils Relative %: 67
Neutrophils Relative %: 72

## 2011-10-10 LAB — URINALYSIS, ROUTINE W REFLEX MICROSCOPIC
Glucose, UA: NEGATIVE
Hgb urine dipstick: NEGATIVE
Specific Gravity, Urine: 1.017

## 2011-10-10 LAB — LIPID PANEL
Cholesterol: 153
LDL Cholesterol: 102 — ABNORMAL HIGH
Triglycerides: 118

## 2011-10-10 LAB — RAPID URINE DRUG SCREEN, HOSP PERFORMED
Barbiturates: NOT DETECTED
Opiates: NOT DETECTED

## 2011-10-10 LAB — RENAL FUNCTION PANEL
CO2: 18 — ABNORMAL LOW
Calcium: 9.1
Chloride: 115 — ABNORMAL HIGH
GFR calc Af Amer: 18 — ABNORMAL LOW
GFR calc non Af Amer: 15 — ABNORMAL LOW
Potassium: 3.2 — ABNORMAL LOW
Sodium: 146 — ABNORMAL HIGH

## 2011-10-10 LAB — POCT CARDIAC MARKERS
Myoglobin, poc: 320
Operator id: 282201
Troponin i, poc: <0.05

## 2011-10-10 LAB — ETHANOL: Alcohol, Ethyl (B): <5

## 2011-10-10 LAB — CALCIUM, IONIZED: Calcium, Ion: 1.63 — ABNORMAL HIGH

## 2011-10-10 LAB — CK TOTAL AND CKMB (NOT AT ARMC)
CK, MB: 2.2
Relative Index: 1
Total CK: 221 — ABNORMAL HIGH
Total CK: 272 — ABNORMAL HIGH

## 2011-10-10 LAB — TROPONIN I: Troponin I: 0.01

## 2011-10-10 LAB — VITAMIN B12: Vitamin B-12: 550 (ref 211–911)

## 2011-10-10 LAB — OCCULT BLOOD X 1 CARD TO LAB, STOOL: Fecal Occult Bld: NEGATIVE

## 2011-10-10 LAB — PROTIME-INR: Prothrombin Time: 14.6

## 2011-10-10 LAB — CREATININE, URINE, 24 HOUR: Collection Interval-UCRE24: 24

## 2011-10-10 LAB — MAGNESIUM: Magnesium: 2.1

## 2011-10-16 ENCOUNTER — Emergency Department (HOSPITAL_COMMUNITY): Payer: Medicare Other

## 2011-10-16 ENCOUNTER — Emergency Department (HOSPITAL_COMMUNITY)
Admission: EM | Admit: 2011-10-16 | Discharge: 2011-10-17 | Disposition: A | Discharge status: Home / Self Care | Payer: Medicare Other | Attending: Emergency Medicine | Admitting: Emergency Medicine

## 2011-10-16 DIAGNOSIS — R059 Cough, unspecified: Secondary | ICD-10-CM | POA: Insufficient documentation

## 2011-10-16 DIAGNOSIS — F0280 Dementia in other diseases classified elsewhere without behavioral disturbance: Secondary | ICD-10-CM | POA: Insufficient documentation

## 2011-10-16 DIAGNOSIS — R0602 Shortness of breath: Secondary | ICD-10-CM | POA: Insufficient documentation

## 2011-10-16 DIAGNOSIS — I10 Essential (primary) hypertension: Secondary | ICD-10-CM | POA: Insufficient documentation

## 2011-10-16 DIAGNOSIS — R609 Edema, unspecified: Secondary | ICD-10-CM | POA: Insufficient documentation

## 2011-10-16 DIAGNOSIS — G8929 Other chronic pain: Secondary | ICD-10-CM | POA: Insufficient documentation

## 2011-10-16 DIAGNOSIS — R071 Chest pain on breathing: Secondary | ICD-10-CM | POA: Insufficient documentation

## 2011-10-16 DIAGNOSIS — Z79899 Other long term (current) drug therapy: Secondary | ICD-10-CM | POA: Insufficient documentation

## 2011-10-16 DIAGNOSIS — K219 Gastro-esophageal reflux disease without esophagitis: Secondary | ICD-10-CM | POA: Insufficient documentation

## 2011-10-16 DIAGNOSIS — R0609 Other forms of dyspnea: Secondary | ICD-10-CM | POA: Insufficient documentation

## 2011-10-16 DIAGNOSIS — R05 Cough: Secondary | ICD-10-CM | POA: Insufficient documentation

## 2011-10-16 DIAGNOSIS — R0989 Other specified symptoms and signs involving the circulatory and respiratory systems: Secondary | ICD-10-CM | POA: Insufficient documentation

## 2011-10-16 DIAGNOSIS — M129 Arthropathy, unspecified: Secondary | ICD-10-CM | POA: Insufficient documentation

## 2011-10-16 DIAGNOSIS — G309 Alzheimer's disease, unspecified: Secondary | ICD-10-CM | POA: Insufficient documentation

## 2011-10-16 LAB — BASIC METABOLIC PANEL
BUN: 30 mg/dL — ABNORMAL HIGH (ref 6–23)
CO2: 30 mEq/L (ref 19–32)
Calcium: 9.9 mg/dL (ref 8.4–10.5)
Chloride: 99 mEq/L (ref 96–112)
Creatinine, Ser: 2.13 mg/dL — ABNORMAL HIGH (ref 0.50–1.10)
GFR calc Af Amer: 26 mL/min — ABNORMAL LOW (ref >90)
GFR calc non Af Amer: 22 mL/min — ABNORMAL LOW (ref >90)
Glucose, Bld: 88 mg/dL (ref 70–99)
Potassium: 4.4 mEq/L (ref 3.5–5.1)
Sodium: 141 mEq/L (ref 135–145)

## 2011-10-16 LAB — CBC
HCT: 34.2 % — ABNORMAL LOW (ref 36.0–46.0)
Hemoglobin: 10.8 g/dL — ABNORMAL LOW (ref 12.0–15.0)
MCV: 91.9 fL (ref 78.0–100.0)
Platelets: 264 10*3/uL (ref 150–400)
RBC: 3.72 MIL/uL — ABNORMAL LOW (ref 3.87–5.11)
WBC: 9.3 10*3/uL (ref 4.0–10.5)

## 2011-10-16 LAB — DIFFERENTIAL
Eosinophils Absolute: 0.9 10*3/uL — ABNORMAL HIGH (ref 0.0–0.7)
Lymphocytes Relative: 26 % (ref 12–46)
Lymphs Abs: 2.5 10*3/uL (ref 0.7–4.0)
Monocytes Relative: 6 % (ref 3–12)
Neutro Abs: 5.4 10*3/uL (ref 1.7–7.7)
Neutrophils Relative %: 58 % (ref 43–77)

## 2011-10-16 LAB — POCT I-STAT TROPONIN I: Troponin i, poc: 0 ng/mL (ref 0.00–0.08)

## 2011-10-17 LAB — POCT I-STAT TROPONIN I

## 2011-11-12 ENCOUNTER — Encounter: Payer: Self-pay | Admitting: *Deleted

## 2011-11-12 ENCOUNTER — Emergency Department (HOSPITAL_COMMUNITY): Payer: Medicare Other

## 2011-11-12 ENCOUNTER — Other Ambulatory Visit: Payer: Self-pay

## 2011-11-12 ENCOUNTER — Inpatient Hospital Stay (HOSPITAL_COMMUNITY)
Admission: EM | Admit: 2011-11-12 | Discharge: 2011-11-16 | DRG: 313 | Disposition: A | Discharge status: Home / Self Care | Payer: Medicare Other | Source: Ambulatory Visit | Attending: Cardiology | Admitting: Cardiology

## 2011-11-12 DIAGNOSIS — E739 Lactose intolerance, unspecified: Secondary | ICD-10-CM | POA: Diagnosis present

## 2011-11-12 DIAGNOSIS — D638 Anemia in other chronic diseases classified elsewhere: Secondary | ICD-10-CM | POA: Diagnosis present

## 2011-11-12 DIAGNOSIS — R609 Edema, unspecified: Secondary | ICD-10-CM | POA: Diagnosis present

## 2011-11-12 DIAGNOSIS — I129 Hypertensive chronic kidney disease with stage 1 through stage 4 chronic kidney disease, or unspecified chronic kidney disease: Secondary | ICD-10-CM | POA: Diagnosis present

## 2011-11-12 DIAGNOSIS — M109 Gout, unspecified: Secondary | ICD-10-CM | POA: Diagnosis present

## 2011-11-12 DIAGNOSIS — M1990 Unspecified osteoarthritis, unspecified site: Secondary | ICD-10-CM | POA: Diagnosis present

## 2011-11-12 DIAGNOSIS — F0390 Unspecified dementia without behavioral disturbance: Secondary | ICD-10-CM | POA: Diagnosis present

## 2011-11-12 DIAGNOSIS — Z8673 Personal history of transient ischemic attack (TIA), and cerebral infarction without residual deficits: Secondary | ICD-10-CM

## 2011-11-12 DIAGNOSIS — N189 Chronic kidney disease, unspecified: Secondary | ICD-10-CM | POA: Diagnosis present

## 2011-11-12 DIAGNOSIS — R0789 Other chest pain: Principal | ICD-10-CM | POA: Diagnosis present

## 2011-11-12 DIAGNOSIS — E669 Obesity, unspecified: Secondary | ICD-10-CM | POA: Diagnosis present

## 2011-11-12 DIAGNOSIS — R600 Localized edema: Secondary | ICD-10-CM | POA: Diagnosis present

## 2011-11-12 DIAGNOSIS — R079 Chest pain, unspecified: Secondary | ICD-10-CM | POA: Diagnosis present

## 2011-11-12 DIAGNOSIS — E8809 Other disorders of plasma-protein metabolism, not elsewhere classified: Secondary | ICD-10-CM | POA: Diagnosis present

## 2011-11-12 DIAGNOSIS — N390 Urinary tract infection, site not specified: Secondary | ICD-10-CM | POA: Diagnosis present

## 2011-11-12 HISTORY — DX: Encounter for other specified aftercare: Z51.89

## 2011-11-12 HISTORY — DX: Chronic kidney disease, unspecified: N18.9

## 2011-11-12 HISTORY — DX: Acute upper respiratory infection, unspecified: J06.9

## 2011-11-12 HISTORY — DX: Essential (primary) hypertension: I10

## 2011-11-12 HISTORY — DX: Reserved for inherently not codable concepts without codable children: IMO0001

## 2011-11-12 HISTORY — DX: Unspecified osteoarthritis, unspecified site: M19.90

## 2011-11-12 HISTORY — DX: Headache: R51

## 2011-11-12 HISTORY — DX: Cerebral infarction, unspecified: I63.9

## 2011-11-12 HISTORY — DX: Anxiety disorder, unspecified: F41.9

## 2011-11-12 HISTORY — DX: Angina pectoris, unspecified: I20.9

## 2011-11-12 HISTORY — DX: Shortness of breath: R06.02

## 2011-11-12 HISTORY — DX: Cardiac murmur, unspecified: R01.1

## 2011-11-12 HISTORY — DX: Hypothyroidism, unspecified: E03.9

## 2011-11-12 HISTORY — DX: Obesity, unspecified: E66.9

## 2011-11-12 HISTORY — DX: Anemia, unspecified: D64.9

## 2011-11-12 HISTORY — DX: Cardiac arrhythmia, unspecified: I49.9

## 2011-11-12 LAB — DIFFERENTIAL
Basophils Absolute: 0.1 10*3/uL (ref 0.0–0.1)
Lymphocytes Relative: 19 % (ref 12–46)
Monocytes Absolute: 0.3 10*3/uL (ref 0.1–1.0)
Monocytes Relative: 4 % (ref 3–12)
Neutro Abs: 5.6 10*3/uL (ref 1.7–7.7)
Neutrophils Relative %: 68 % (ref 43–77)

## 2011-11-12 LAB — URINE MICROSCOPIC-ADD ON

## 2011-11-12 LAB — BASIC METABOLIC PANEL
BUN: 51 mg/dL — ABNORMAL HIGH (ref 6–23)
CO2: 26 mEq/L (ref 19–32)
Chloride: 106 mEq/L (ref 96–112)
Creatinine, Ser: 2.52 mg/dL — ABNORMAL HIGH (ref 0.50–1.10)
Potassium: 4.6 mEq/L (ref 3.5–5.1)

## 2011-11-12 LAB — CBC
HCT: 34.4 % — ABNORMAL LOW (ref 36.0–46.0)
Hemoglobin: 10.6 g/dL — ABNORMAL LOW (ref 12.0–15.0)
WBC: 8.2 10*3/uL (ref 4.0–10.5)

## 2011-11-12 LAB — POCT I-STAT TROPONIN I: Troponin i, poc: 0 ng/mL (ref 0.00–0.08)

## 2011-11-12 LAB — URINALYSIS, ROUTINE W REFLEX MICROSCOPIC
Glucose, UA: NEGATIVE mg/dL
Ketones, ur: NEGATIVE mg/dL
Nitrite: NEGATIVE
Specific Gravity, Urine: 1.02 (ref 1.005–1.030)
pH: 5.5 (ref 5.0–8.0)

## 2011-11-12 LAB — CARDIAC PANEL(CRET KIN+CKTOT+MB+TROPI): Total CK: 119 U/L (ref 7–177)

## 2011-11-12 MED ORDER — DONEPEZIL HCL 10 MG PO TABS
10.0000 mg | ORAL_TABLET | Freq: Every day | ORAL | Status: DC
  Administered 2011-11-12 – 2011-11-15 (×4): 10 mg via ORAL
  Filled 2011-11-12 (×5): qty 1

## 2011-11-12 MED ORDER — NITROGLYCERIN 0.4 MG SL SUBL: 0.4000 mg | SUBLINGUAL_TABLET | SUBLINGUAL | Status: DC | PRN

## 2011-11-12 MED ORDER — LORATADINE 10 MG PO TABS
10.0000 mg | ORAL_TABLET | Freq: Every day | ORAL | Status: DC
  Administered 2011-11-13 – 2011-11-16 (×4): 10 mg via ORAL
  Filled 2011-11-12 (×4): qty 1

## 2011-11-12 MED ORDER — FUROSEMIDE 40 MG PO TABS
40.0000 mg | ORAL_TABLET | Freq: Two times a day (BID) | ORAL | Status: DC
  Administered 2011-11-12 – 2011-11-16 (×8): 40 mg via ORAL
  Filled 2011-11-12 (×9): qty 1

## 2011-11-12 MED ORDER — CLOPIDOGREL BISULFATE 75 MG PO TABS
75.0000 mg | ORAL_TABLET | Freq: Every day | ORAL | Status: DC
  Administered 2011-11-13 – 2011-11-16 (×4): 75 mg via ORAL
  Filled 2011-11-12 (×4): qty 1

## 2011-11-12 MED ORDER — SUCRALFATE 1 G PO TABS
1.0000 g | ORAL_TABLET | Freq: Two times a day (BID) | ORAL | Status: DC
Start: 2011-11-12 — End: 2011-11-16
  Administered 2011-11-12 – 2011-11-16 (×8): 1 g via ORAL
  Filled 2011-11-12 (×9): qty 1

## 2011-11-12 MED ORDER — SODIUM CHLORIDE 0.9 % IV SOLN
250.0000 mL | INTRAVENOUS | Status: DC
  Administered 2011-11-12: 250 mL via INTRAVENOUS

## 2011-11-12 MED ORDER — PANTOPRAZOLE SODIUM 40 MG PO TBEC
40.0000 mg | DELAYED_RELEASE_TABLET | Freq: Every day | ORAL | Status: DC
  Administered 2011-11-12 – 2011-11-16 (×5): 40 mg via ORAL
  Filled 2011-11-12 (×6): qty 1

## 2011-11-12 MED ORDER — ALLOPURINOL 300 MG PO TABS
300.0000 mg | ORAL_TABLET | Freq: Every day | ORAL | Status: DC
  Administered 2011-11-13 – 2011-11-16 (×4): 300 mg via ORAL
  Filled 2011-11-12 (×4): qty 1

## 2011-11-12 MED ORDER — POTASSIUM CHLORIDE CRYS ER 10 MEQ PO TBCR
10.0000 meq | EXTENDED_RELEASE_TABLET | Freq: Every day | ORAL | Status: DC
  Administered 2011-11-12 – 2011-11-16 (×5): 10 meq via ORAL
  Filled 2011-11-12 (×5): qty 1

## 2011-11-12 MED ORDER — ONDANSETRON HCL 4 MG/2ML IJ SOLN: 4.0000 mg | Freq: Four times a day (QID) | INTRAMUSCULAR | Status: DC | PRN

## 2011-11-12 MED ORDER — SIMVASTATIN 40 MG PO TABS
40.0000 mg | ORAL_TABLET | Freq: Every day | ORAL | Status: DC
Start: 2011-11-12 — End: 2011-11-16
  Administered 2011-11-12 – 2011-11-15 (×4): 40 mg via ORAL
  Filled 2011-11-12 (×5): qty 1

## 2011-11-12 MED ORDER — ALBUTEROL SULFATE HFA 108 (90 BASE) MCG/ACT IN AERS: 2.0000 | INHALATION_SPRAY | Freq: Four times a day (QID) | RESPIRATORY_TRACT | Status: DC | PRN

## 2011-11-12 MED ORDER — ACETAMINOPHEN 325 MG PO TABS
650.0000 mg | ORAL_TABLET | ORAL | Status: DC | PRN
  Administered 2011-11-15: 650 mg via ORAL
  Filled 2011-11-12 (×2): qty 2

## 2011-11-12 MED ORDER — OXYBUTYNIN CHLORIDE 5 MG PO TABS
5.0000 mg | ORAL_TABLET | Freq: Three times a day (TID) | ORAL | Status: DC
  Administered 2011-11-12 – 2011-11-16 (×11): 5 mg via ORAL
  Filled 2011-11-12 (×13): qty 1

## 2011-11-12 MED ORDER — ZOLPIDEM TARTRATE 5 MG PO TABS: 5.0000 mg | ORAL_TABLET | Freq: Every evening | ORAL | Status: DC | PRN

## 2011-11-12 MED ORDER — HEPARIN (PORCINE) IN NACL 100-0.45 UNIT/ML-% IJ SOLN
1000.0000 [IU]/h | INTRAMUSCULAR | Status: DC
  Administered 2011-11-12: 1000 [IU]/h via INTRAVENOUS
  Filled 2011-11-12 (×2): qty 250

## 2011-11-12 MED ORDER — HYDROCODONE-ACETAMINOPHEN 5-325 MG PO TABS
1.0000 | ORAL_TABLET | Freq: Two times a day (BID) | ORAL | Status: DC
  Administered 2011-11-12 – 2011-11-16 (×8): 1 via ORAL
  Filled 2011-11-12 (×8): qty 1

## 2011-11-12 MED ORDER — SODIUM CHLORIDE 0.9 % IJ SOLN: 3.0000 mL | INTRAMUSCULAR | Status: DC | PRN

## 2011-11-12 MED ORDER — DEXTROSE 5 % IV SOLN
1.0000 g | Freq: Once | INTRAVENOUS | Status: AC
  Administered 2011-11-13: 1 g via INTRAVENOUS
  Filled 2011-11-12 (×2): qty 10

## 2011-11-12 MED ORDER — MEMANTINE HCL 10 MG PO TABS
10.0000 mg | ORAL_TABLET | Freq: Two times a day (BID) | ORAL | Status: DC
  Administered 2011-11-12 – 2011-11-16 (×8): 10 mg via ORAL
  Filled 2011-11-12 (×9): qty 1

## 2011-11-12 MED ORDER — CARVEDILOL 3.125 MG PO TABS
3.1250 mg | ORAL_TABLET | Freq: Two times a day (BID) | ORAL | Status: DC
  Administered 2011-11-13 – 2011-11-16 (×5): 3.125 mg via ORAL
  Filled 2011-11-12 (×9): qty 1

## 2011-11-12 MED ORDER — SODIUM CHLORIDE 0.9 % IJ SOLN
3.0000 mL | Freq: Two times a day (BID) | INTRAMUSCULAR | Status: DC
  Administered 2011-11-13 – 2011-11-16 (×4): 3 mL via INTRAVENOUS

## 2011-11-12 MED ORDER — CLORAZEPATE DIPOTASSIUM 7.5 MG PO TABS
7.5000 mg | ORAL_TABLET | Freq: Three times a day (TID) | ORAL | Status: DC
Start: 2011-11-12 — End: 2011-11-16
  Administered 2011-11-12 – 2011-11-16 (×11): 7.5 mg via ORAL
  Filled 2011-11-12 (×11): qty 2

## 2011-11-12 MED ORDER — HEPARIN BOLUS VIA INFUSION
3000.0000 [IU] | INTRAVENOUS | Status: AC
  Administered 2011-11-12: 3000 [IU] via INTRAVENOUS
  Filled 2011-11-12: qty 3000

## 2011-11-12 MED ORDER — ASPIRIN EC 81 MG PO TBEC
81.0000 mg | DELAYED_RELEASE_TABLET | Freq: Every day | ORAL | Status: DC
  Administered 2011-11-13 – 2011-11-16 (×4): 81 mg via ORAL
  Filled 2011-11-12 (×4): qty 1

## 2011-11-12 NOTE — ED Notes (Signed)
Bilateral ankle edema R>L  1-2+

## 2011-11-12 NOTE — Progress Notes (Signed)
ANTICOAGULATION CONSULT NOTE - Initial Consult  Pharmacy Consult for Heparin  Indication: chest pain/ACS  Allergies  Allergen Reactions  . Sulfa Antibiotics Itching    Patient Measurements:   Heparin dosing Weight: ~78Kg from last admit.  Vital Signs: Temp: 99.2 F (37.3 C) (11/18 1632) Temp src: Oral (11/18 1632) BP: 100/69 mmHg (11/18 2030) Pulse Rate: 89  (11/18 2030)  Labs:  [facility] 11/12/11 1844  HGB 10.6*  HCT 34.4*  PLT 245  APTT --  LABPROT --  INR --  HEPARINUNFRC --  CREATININE 2.52*  CKTOTAL --  CKMB --  TROPONINI --   CrCl is unknown because there is no height on file for the current visit.  Medical History: Past Medical History  Diagnosis Date  . Arthritis   . Hypertension   . Gout   . Obesity     Medications:  Prescriptions prior to admission  Medication Sig Dispense Refill  . albuterol (PROVENTIL HFA;VENTOLIN HFA) 108 (90 BASE) MCG/ACT inhaler Inhale 2 puffs into the lungs every 6 (six) hours as needed. For shortness of breath       . allopurinol (ZYLOPRIM) 300 MG tablet Take 300 mg by mouth daily.        . carvedilol (COREG) 3.125 MG tablet Take 3.125 mg by mouth 2 (two) times daily with a meal.        . clorazepate (TRANXENE) 7.5 MG tablet Take 7.5 mg by mouth 3 (three) times daily.        Marland Kitchen donepezil (ARICEPT) 10 MG tablet Take 10 mg by mouth at bedtime.        . furosemide (LASIX) 40 MG tablet Take 40 mg by mouth 2 (two) times daily.        Marland Kitchen HYDROcodone-acetaminophen (VICODIN) 5-500 MG per tablet Take 1 tablet by mouth 2 (two) times daily as needed. For pain       . loratadine (CLARITIN) 10 MG tablet Take 10 mg by mouth daily.        . memantine (NAMENDA) 10 MG tablet Take 10 mg by mouth 2 (two) times daily.        Marland Kitchen omeprazole (PRILOSEC) 20 MG capsule Take 20 mg by mouth daily.        Marland Kitchen oxybutynin (DITROPAN) 5 MG tablet Take 5 mg by mouth 3 (three) times daily.        . potassium chloride SA (K-DUR,KLOR-CON) 20 MEQ tablet Take 10 mEq  by mouth daily.        . simvastatin (ZOCOR) 40 MG tablet Take 40 mg by mouth at bedtime.        . sucralfate (CARAFATE) 1 G tablet Take 1 g by mouth 2 (two) times daily.         Scheduled:    . allopurinol  300 mg Oral Daily  . aspirin EC  81 mg Oral Daily  . carvedilol  3.125 mg Oral BID WC  . cefTRIAXone (ROCEPHIN) IV  1 g Intravenous Once  . clopidogrel  75 mg Oral Q breakfast  . clorazepate  7.5 mg Oral TID  . donepezil  10 mg Oral QHS  . furosemide  40 mg Oral BID  . HYDROcodone-acetaminophen  1 tablet Oral BID  . loratadine  10 mg Oral Daily  . memantine  10 mg Oral BID  . oxybutynin  5 mg Oral TID  . pantoprazole  40 mg Oral Q1200  . potassium chloride SA  10 mEq Oral Daily  . simvastatin  40  mg Oral QHS  . sodium chloride  3 mL Intravenous Q12H  . sucralfate  1 g Oral BID    Assessment: Patient is admitted with chest pain with radiation down left arm. Noted CE pending, Ddimer elevated on admit. Not on anticoagulant PTA.  Goal of Therapy:  Heparin level 0.3-0.7 units/ml   Plan:  Will initiate Heparin per ACS protocol. 1. Heparin 3000 units IV bolus x 1 now, then start drip at 1000 units/hr. 2. Heparin level 8h post drip starts (~0500), CBC at that time, then heparin level and CBC daily. 3. Will f/up along with you.     Anne-Marie Genson K. Allena Katz, PharmD, BCPS.  Clinical Pharmacist Pager [phone]. 11/12/2011 10:01 PM

## 2011-11-12 NOTE — ED Provider Notes (Signed)
History     CSN: [id number] Arrival date & time: 11/12/2011  4:28 PM   First MD Initiated Contact with Patient 11/12/11 1809      Chief Complaint  Patient presents with  . Chest Pain    this began thursday.  Pain is in mid chest and increases when pt takes a deep breath.  Pain radiates in to neck and shoulder.  Pt has some sob with this.  Pt also reports some nausea with this.      (Consider location/radiation/quality/duration/timing/severity/associated sxs/prior treatment) Patient is a 70 y.o. female presenting with chest pain. The history is provided by the patient. No language interpreter was used.  Chest Pain The chest pain began 2 days ago. Chest pain occurs constantly. The chest pain is unchanged. The pain is associated with breathing and exertion. At its most intense, the pain is at 6/10. The pain is currently at 4/10. The severity of the pain is moderate. The quality of the pain is described as aching, dull and pressure-like. The pain radiates to the left neck. Chest pain is worsened by certain positions and deep breathing. Primary symptoms include fatigue and shortness of breath. Pertinent negatives for primary symptoms include no fever, no syncope, no cough, no wheezing, no palpitations, no abdominal pain, no nausea, no vomiting and no dizziness.  Associated symptoms include lower extremity edema.  Pertinent negatives for associated symptoms include no diaphoresis, no near-syncope, no numbness and no weakness. She tried nothing for the symptoms. Risk factors include being elderly, lack of exercise, obesity, post-menopausal and sedentary lifestyle.  Her past medical history is significant for hypertension.  Pertinent negatives for past medical history include no CAD, no MI, no PE and no PVD.   PERC 5 with c/o SOB and L chest pain that is worse with deep breath and movement x 2 days 6/10.  Taking vicodin for arthritis pain twice a day per Dr. Particia Jasper.  2+ Lower extremity edema with +  calf pain.  Lives alone and she is certain that "something is not right.  Past Medical History  Diagnosis Date  . Arthritis   . Hypertension   . Gout   . Obesity     Past Surgical History  Procedure Date  . Thyroid surgery     No family history on file.  History  Substance Use Topics  . Smoking status: Not on file  . Smokeless tobacco: Not on file  . Alcohol Use: No    OB History    Grav Para Term Preterm Abortions TAB SAB Ect Mult Living                  Review of Systems  Constitutional: Positive for fatigue. Negative for fever and diaphoresis.  Respiratory: Positive for shortness of breath. Negative for cough and wheezing.   Cardiovascular: Positive for chest pain. Negative for palpitations, syncope and near-syncope.  Gastrointestinal: Negative for nausea, vomiting and abdominal pain.  Neurological: Negative for dizziness, weakness and numbness.  All other systems reviewed and are negative.    Allergies  Sulfa antibiotics  Home Medications   Current Outpatient Rx  Name Route Sig Dispense Refill  . ALBUTEROL SULFATE HFA 108 (90 BASE) MCG/ACT IN AERS Inhalation Inhale 2 puffs into the lungs every 6 (six) hours as needed. For shortness of breath     . ALLOPURINOL 300 MG PO TABS Oral Take 300 mg by mouth daily.      Marland Kitchen CARVEDILOL 3.125 MG PO TABS Oral Take 3.125 mg  by mouth 2 (two) times daily with a meal.      . CLORAZEPATE DIPOTASSIUM 7.5 MG PO TABS Oral Take 7.5 mg by mouth 3 (three) times daily.      . DONEPEZIL HCL 10 MG PO TABS Oral Take 10 mg by mouth at bedtime.      . FUROSEMIDE 40 MG PO TABS Oral Take 40 mg by mouth 2 (two) times daily.      Marland Kitchen HYDROCODONE-ACETAMINOPHEN 5-500 MG PO TABS Oral Take 1 tablet by mouth 2 (two) times daily as needed. For pain     . LORATADINE 10 MG PO TABS Oral Take 10 mg by mouth daily.      Marland Kitchen MEMANTINE HCL 10 MG PO TABS Oral Take 10 mg by mouth 2 (two) times daily.      Marland Kitchen OMEPRAZOLE 20 MG PO CPDR Oral Take 20 mg by mouth  daily.      . OXYBUTYNIN CHLORIDE 5 MG PO TABS Oral Take 5 mg by mouth 3 (three) times daily.      Marland Kitchen POTASSIUM CHLORIDE CRYS CR 20 MEQ PO TBCR Oral Take 10 mEq by mouth daily.      Marland Kitchen SIMVASTATIN 40 MG PO TABS Oral Take 40 mg by mouth at bedtime.      . SUCRALFATE 1 G PO TABS Oral Take 1 g by mouth 2 (two) times daily.      Marland Kitchen VALSARTAN 320 MG PO TABS Oral Take 320 mg by mouth every morning.        BP 103/74  Pulse 100  Temp(Src) 99.2 F (37.3 C) (Oral)  Resp 18  SpO2 95%  Physical Exam  Nursing note and vitals reviewed. Constitutional: She is oriented to person, place, and time. She appears well-developed and well-nourished.  HENT:  Head: Normocephalic.  Eyes: Pupils are equal, round, and reactive to light.  Neck: Normal range of motion. Neck supple. No JVD present.  Cardiovascular: Normal rate, regular rhythm, S1 normal, S2 normal, normal heart sounds, intact distal pulses and normal pulses.  Exam reveals no gallop and no friction rub.   No murmur heard. Pulmonary/Chest: Effort normal. No respiratory distress. She has no wheezes.  Abdominal: Soft. Bowel sounds are normal.  Musculoskeletal: She exhibits edema. She exhibits no tenderness.  Neurological: She is alert and oriented to person, place, and time.  Skin: Skin is warm and dry. She is not diaphoretic.  Psychiatric: She has a normal mood and affect.    ED Course  Procedures (including critical care time)  Labs Reviewed  CBC - Abnormal; Notable for the following:    RBC 3.76 (*)    Hemoglobin 10.6 (*)    HCT 34.4 (*)    All other components within normal limits  DIFFERENTIAL - Abnormal; Notable for the following:    Eosinophils Relative 9 (*)    All other components within normal limits  URINALYSIS, ROUTINE W REFLEX MICROSCOPIC - Abnormal; Notable for the following:    Appearance CLOUDY (*)    Leukocytes, UA MODERATE (*)    All other components within normal limits  D-DIMER, QUANTITATIVE - Abnormal; Notable for the  following:    D-Dimer, Quant 0.75 (*)    All other components within normal limits  URINE MICROSCOPIC-ADD ON - Abnormal; Notable for the following:    Squamous Epithelial / LPF MANY (*)    Bacteria, UA MANY (*)    Casts HYALINE CASTS (*)    All other components within normal limits  POCT I-STAT  TROPONIN I  BASIC METABOLIC PANEL  I-STAT TROPONIN I   Dg Chest Port 1 View  11/12/2011  *RADIOLOGY REPORT*  Clinical Data: Nausea.  Chest pain.  Shortness of breath.  Cough. Congestion.  PORTABLE CHEST - 1 VIEW  Comparison: 10/16/2011  Findings: Considerable tortuosity of the thoracic aorta is noted. Retrocardiac density is compatible with the patient's known hiatal hernia.  Mild cardiomegaly noted.  There is linear subsegmental atelectasis or scarring at the left lung base.  Otherwise the lungs appear clear.  IMPRESSION:  1.  Hiatal hernia. 2.  Tortuous aorta. 3.  Linear subsegmental atelectasis or scarring at the left lung base.  Original Report Authenticated By: Dellia Cloud, M.D.     No diagnosis found.    MDM     9:55pm MIdsternal CP with radiation to L neck and shoulder with SOB and nausea.  Pain is worse with deep breath and movement.  Considering her age and PMH of MI per daughter.  Would like to admit for chest pain r/o and UTI.    Dr Algie Coffer (on call for [facility]) will admit.  Trop - x 2.  EKG with no changes.  + UTI with 10-20 WBC. Temp 99.2.  B met with BUN 51 Cr 2.52.  2+pitting edema to LE. Chest x-ray with no pneumonia or CHF.   Lives alone.          Jethro Bastos, NP 11/13/11 1210

## 2011-11-12 NOTE — ED Notes (Signed)
Pt states that some of her neck and back apin feels like arthritis and pt talks about her difficulties being alone at home and her wish to go to a nursing home

## 2011-11-12 NOTE — H&P (Signed)
Katherine Cooper is an 70 y.o. female.   Chief Complaint: Chest pain, cough and chronic leg edema.  HPI: 70 years old black female with 1 week history of cough and chest pain along with shortness of breath and arm pain. No sweating spell, fever or chills.  Past Medical History  Diagnosis Date  . Arthritis   . Hypertension   . Gout   . Obesity     Past Surgical History  Procedure Date  . Thyroid surgery     No family history on file. Social History:  does not have a smoking history on file. She does not have any smokeless tobacco history on file. She reports that she does not drink alcohol or use illicit drugs.  Allergies:  Allergies  Allergen Reactions  . Sulfa Antibiotics Itching    No current facility-administered medications on file as of 11/12/2011.   No current outpatient prescriptions on file as of 11/12/2011.    Results for orders placed during the hospital encounter of 11/12/11 (from the past 48 hour(s))  URINALYSIS, ROUTINE W REFLEX MICROSCOPIC     Status: Abnormal   Collection Time   11/12/11  6:37 PM      Component Value Range Comment   Color, Urine YELLOW  YELLOW     Appearance CLOUDY (*) CLEAR     Specific Gravity, Urine 1.020  1.005 - 1.030     pH 5.5  5.0 - 8.0     Glucose, UA NEGATIVE  NEGATIVE (mg/dL)    Hgb urine dipstick NEGATIVE  NEGATIVE     Bilirubin Urine NEGATIVE  NEGATIVE     Ketones, ur NEGATIVE  NEGATIVE (mg/dL)    Protein, ur NEGATIVE  NEGATIVE (mg/dL)    Urobilinogen, UA 0.2  0.0 - 1.0 (mg/dL)    Nitrite NEGATIVE  NEGATIVE     Leukocytes, UA MODERATE (*) NEGATIVE    URINE MICROSCOPIC-ADD ON     Status: Abnormal   Collection Time   11/12/11  6:37 PM      Component Value Range Comment   Squamous Epithelial / LPF MANY (*) RARE     WBC, UA 11-20  <3 (WBC/hpf)    RBC / HPF 0-2  <3 (RBC/hpf)    Bacteria, UA MANY (*) RARE     Casts HYALINE CASTS (*) NEGATIVE    CBC     Status: Abnormal   Collection Time   11/12/11  6:44 PM      Component  Value Range Comment   WBC 8.2  4.0 - 10.5 (K/uL)    RBC 3.76 (*) 3.87 - 5.11 (MIL/uL)    Hemoglobin 10.6 (*) 12.0 - 15.0 (g/dL)    HCT 16.1 (*) 09.6 - 46.0 (%)    MCV 91.5  78.0 - 100.0 (fL)    MCH 28.2  26.0 - 34.0 (pg)    MCHC 30.8  30.0 - 36.0 (g/dL)    RDW 04.5  40.9 - 81.1 (%)    Platelets 245  150 - 400 (K/uL)   DIFFERENTIAL     Status: Abnormal   Collection Time   11/12/11  6:44 PM      Component Value Range Comment   Neutrophils Relative 68  43 - 77 (%)    Neutro Abs 5.6  1.7 - 7.7 (K/uL)    Lymphocytes Relative 19  12 - 46 (%)    Lymphs Abs 1.6  0.7 - 4.0 (K/uL)    Monocytes Relative 4  3 - 12 (%)  Monocytes Absolute 0.3  0.1 - 1.0 (K/uL)    Eosinophils Relative 9 (*) 0 - 5 (%)    Eosinophils Absolute 0.7  0.0 - 0.7 (K/uL)    Basophils Relative 1  0 - 1 (%)    Basophils Absolute 0.1  0.0 - 0.1 (K/uL)   BASIC METABOLIC PANEL     Status: Abnormal   Collection Time   11/12/11  6:44 PM      Component Value Range Comment   Sodium 144  135 - 145 (mEq/L)    Potassium 4.6  3.5 - 5.1 (mEq/L)    Chloride 106  96 - 112 (mEq/L)    CO2 26  19 - 32 (mEq/L)    Glucose, Bld 109 (*) 70 - 99 (mg/dL)    BUN 51 (*) 6 - 23 (mg/dL)    Creatinine, Ser 1.61 (*) 0.50 - 1.10 (mg/dL)    Calcium 9.4  8.4 - 10.5 (mg/dL)    GFR calc non Af Amer 18 (*) >90 (mL/min)    GFR calc Af Amer 21 (*) >90 (mL/min)   D-DIMER, QUANTITATIVE     Status: Abnormal   Collection Time   11/12/11  6:44 PM      Component Value Range Comment   D-Dimer, Quant 0.75 (*) 0.00 - 0.48 (ug/mL-FEU)   POCT I-STAT TROPONIN I     Status: Normal   Collection Time   11/12/11  7:02 PM      Component Value Range Comment   Troponin i, poc 0.00  0.00 - 0.08 (ng/mL)    Comment 3             Dg Chest Port 1 View  11/12/2011  *RADIOLOGY REPORT*  Clinical Data: Nausea.  Chest pain.  Shortness of breath.  Cough. Congestion.  PORTABLE CHEST - 1 VIEW  Comparison: 10/16/2011  Findings: Considerable tortuosity of the thoracic aorta  is noted. Retrocardiac density is compatible with the patient's known hiatal hernia.  Mild cardiomegaly noted.  There is linear subsegmental atelectasis or scarring at the left lung base.  Otherwise the lungs appear clear.  IMPRESSION:  1.  Hiatal hernia. 2.  Tortuous aorta. 3.  Linear subsegmental atelectasis or scarring at the left lung base.  Original Report Authenticated By: Dellia Cloud, M.D.    Review of Systems  Constitutional: Negative for fever, chills and weight loss.  HENT: Negative for hearing loss, ear pain, nosebleeds, sore throat, neck pain and tinnitus.   Eyes: Negative for blurred vision, discharge and redness.  Respiratory: Positive for cough and shortness of breath. Negative for hemoptysis, sputum production and wheezing.   Cardiovascular: Positive for chest pain and leg swelling. Negative for palpitations, orthopnea and claudication.  Gastrointestinal: Positive for heartburn, nausea and constipation. Negative for vomiting, abdominal pain, diarrhea and melena.  Genitourinary: Negative for dysuria, frequency and hematuria.  Musculoskeletal: Positive for myalgias, back pain and joint pain.  Skin: Negative for itching and rash.  Neurological: Positive for weakness. Negative for dizziness, tingling, tremors, sensory change, speech change, seizures, loss of consciousness and headaches.  Endo/Heme/Allergies: Negative for environmental allergies and polydipsia. Does not bruise/bleed easily.  Psychiatric/Behavioral: Positive for depression and memory loss. Negative for suicidal ideas, hallucinations and substance abuse. The patient has insomnia. The patient is not nervous/anxious.     Blood pressure 100/69, pulse 89, temperature 99.2 F (37.3 C), temperature source Oral, resp. rate 16, SpO2 98.00%. Physical Exam  Constitutional: She is oriented to person, place, and time. She appears well-nourished.  HENT:  Head: Normocephalic and atraumatic.  Eyes: Conjunctivae and EOM are  normal. Pupils are equal, round, and reactive to light. No scleral icterus.       Bil. Lens haziness  Neck: Neck supple. No JVD present. No tracheal deviation present. No thyromegaly present.  Cardiovascular: Normal rate and regular rhythm.   Murmur heard. Respiratory: Effort normal and breath sounds normal. She has no wheezes. She exhibits tenderness.  GI: Soft. Bowel sounds are normal. There is no tenderness.  Musculoskeletal: She exhibits edema.  Lymphadenopathy:    She has no cervical adenopathy.  Neurological: She is alert and oriented to person, place, and time.  Skin: Skin is warm and dry.  Psychiatric: She has a normal mood and affect.     Assessment/Plan Chest pain R/O CAD Bilateral leg edema Obesity R/O Hypothyroidism   Khayman Kirsch S 11/12/2011, 9:05 PM

## 2011-11-13 ENCOUNTER — Inpatient Hospital Stay (HOSPITAL_COMMUNITY): Payer: Medicare Other

## 2011-11-13 ENCOUNTER — Other Ambulatory Visit: Payer: Self-pay

## 2011-11-13 LAB — COMPREHENSIVE METABOLIC PANEL
Albumin: 3.1 g/dL — ABNORMAL LOW (ref 3.5–5.2)
BUN: 45 mg/dL — ABNORMAL HIGH (ref 6–23)
Chloride: 107 mEq/L (ref 96–112)
Creatinine, Ser: 2.15 mg/dL — ABNORMAL HIGH (ref 0.50–1.10)
GFR calc Af Amer: 26 mL/min — ABNORMAL LOW (ref >90)
GFR calc non Af Amer: 22 mL/min — ABNORMAL LOW (ref >90)
Total Bilirubin: 0.1 mg/dL — ABNORMAL LOW (ref 0.3–1.2)

## 2011-11-13 LAB — CBC
HCT: 31.9 % — ABNORMAL LOW (ref 36.0–46.0)
Hemoglobin: 9.7 g/dL — ABNORMAL LOW (ref 12.0–15.0)
MCH: 27.9 pg (ref 26.0–34.0)
MCHC: 30.4 g/dL (ref 30.0–36.0)

## 2011-11-13 LAB — DIFFERENTIAL
Basophils Relative: 1 % (ref 0–1)
Lymphs Abs: 1.8 10*3/uL (ref 0.7–4.0)
Monocytes Absolute: 0.5 10*3/uL (ref 0.1–1.0)
Monocytes Relative: 7 % (ref 3–12)
Monocytes Relative: 5 % (ref 3–12)
Neutro Abs: 4 10*3/uL (ref 1.7–7.7)
Neutro Abs: 4.5 10*3/uL (ref 1.7–7.7)
Neutrophils Relative %: 61 % (ref 43–77)

## 2011-11-13 LAB — HEPARIN LEVEL (UNFRACTIONATED)
Heparin Unfractionated: 0.3 IU/mL (ref 0.30–0.70)
Heparin Unfractionated: 0.21 IU/mL — ABNORMAL LOW (ref 0.30–0.70)

## 2011-11-13 LAB — TSH: TSH: 2.379 u[IU]/mL (ref 0.350–4.500)

## 2011-11-13 LAB — CARDIAC PANEL(CRET KIN+CKTOT+MB+TROPI)
CK, MB: 2 ng/mL (ref 0.3–4.0)
Relative Index: 1.8 (ref 0.0–2.5)
Relative Index: INVALID (ref 0.0–2.5)
Total CK: 99 U/L (ref 7–177)
Troponin I: <0.3 ng/mL (ref <0.30)

## 2011-11-13 LAB — BASIC METABOLIC PANEL
CO2: 28 mEq/L (ref 19–32)
Chloride: 107 mEq/L (ref 96–112)
Potassium: 4.6 mEq/L (ref 3.5–5.1)
Sodium: 145 mEq/L (ref 135–145)

## 2011-11-13 LAB — MAGNESIUM: Magnesium: 2.1 mg/dL (ref 1.5–2.5)

## 2011-11-13 LAB — LIPID PANEL: HDL: 43 mg/dL (ref >39)

## 2011-11-13 MED ORDER — HEPARIN (PORCINE) IN NACL 100-0.45 UNIT/ML-% IJ SOLN
1200.0000 [IU]/h | INTRAMUSCULAR | Status: DC
  Administered 2011-11-13 – 2011-11-14 (×2): 1200 [IU]/h via INTRAVENOUS
  Filled 2011-11-13 (×3): qty 250

## 2011-11-13 MED ORDER — XENON XE 133 GAS
10.0000 | GAS_FOR_INHALATION | Freq: Once | RESPIRATORY_TRACT | Status: AC | PRN
  Administered 2011-11-13: 10 via RESPIRATORY_TRACT

## 2011-11-13 MED ORDER — HEPARIN BOLUS VIA INFUSION
1000.0000 [IU] | Freq: Once | INTRAVENOUS | Status: AC
  Administered 2011-11-13: 1000 [IU] via INTRAVENOUS
  Filled 2011-11-13: qty 1000

## 2011-11-13 MED ORDER — TECHNETIUM TO 99M ALBUMIN AGGREGATED
6.0000 | Freq: Once | INTRAVENOUS | Status: AC | PRN
  Administered 2011-11-13: 6 via INTRAVENOUS

## 2011-11-13 NOTE — Clinical Documentation Improvement (Signed)
Please update your documentation within the medical record to reflect your response to this query.                                                                                   11/13/11  Dear Dr. Algie Coffer  / Associates  In a better effort to capture your patient's severity of illness, reflect appropriate length of stay and utilization of resources, a review of the medical record has revealed the following indicators.    Based on your clinical judgment, please clarify and document in a progress note and/or discharge summary the clinical condition associated with the following supporting information:  Abnormal findings (laboratory, x-ray, pathologic, and other diagnostic results) are not coded and reported unless the physician indicates their clinical significance.   The medical record reflects the following clinical findings, please clarify the diagnostic and/or clinical significance:       Possible Clinical Conditions?                           Supporting Information:  - ARF                                                                                                                - Other Condition                                                                                                                                                                                                                                                 - Cannot  Clinically Determine   - Patient Values:  : BUN: 45, CR: 2.15, GFR: 22/26 - Treatment: NS IV 90ml/hr, monitoring renal function on L    Reviewed:  no additional documentation provided    Thank [name] RN Clinical Documentation Specialist  Pager:  [phone]

## 2011-11-13 NOTE — Clinical Documentation Improvement (Signed)
Please update your documentation within the medical record to reflect your response to this query.                                                                                   11/13/11  Dear Dr. Algie Coffer  / Associates  In a better effort to capture your patient's severity of illness, reflect appropriate length of stay and utilization of resources, a review of the medical record has revealed the following indicators.    Based on your clinical judgment, please clarify and document in a progress note and/or discharge summary the clinical condition associated with the following supporting information:  In responding to this query please exercise your independent judgment.  The fact that a query is asked, does not imply that any particular answer is desired or expected.  Abnormal findings (laboratory, x-ray, pathologic, and other diagnostic results) are not coded and reported unless the physician indicates their clinical significance.   The medical record reflects the following clinical findings, please clarify the diagnostic and/or clinical significance:       Possible Clinical Conditions?                             - UTI - Other Condition__________________                   Katherine Cooper Clinically Determine  Supporting Information:  - Patient results: Cloudy, Moderate Leuks, Many bacteria, Hyaline casts, Many epithelials - Treatment:  Rocephin IV, UA/UCx    Reviewed:  no additional documentation provided    Thank [name] RN Clinical Documentation Specialist  Pager:  [phone]

## 2011-11-13 NOTE — Progress Notes (Signed)
Pt c/o R shoulder pain going to chest,  7/10, heat applied and vicodin given, pt refused nitro;, BP 90/62, HR 85;  Dr.Kadakia paged and made aware; told to hold coreg for SBP <90 and HR<60; will continue to monitor

## 2011-11-13 NOTE — Progress Notes (Signed)
ANTICOAGULATION CONSULT NOTE - Follow Up Consult  Pharmacy Consult for Heparin Indication: chest pain/ACS  Allergies  Allergen Reactions  . Sulfa Antibiotics Itching    Patient Measurements: Height: 5\' 6"  (167.6 cm) Weight: 248 lb 3.8 oz (112.6 kg) IBW/kg (Calculated) : 59.3  Heparin dosing weight: 86 kg  Vital Signs: Temp: 97.9 F (36.6 C) (11/19 1300) Temp src: Oral (11/19 0500) BP: 97/67 mmHg (11/19 1300) Pulse Rate: 87  (11/19 1300)  Labs:  Basename 11/13/11 1410 11/13/11 0555 11/13/11 0553 11/13/11 11/12/11 2210 11/12/11 1844  HGB -- -- 9.7* -- -- 10.6*  HCT -- -- 31.9* -- -- 34.4*  PLT -- -- 236 -- -- 245  APTT -- -- -- -- 37 --  LABPROT -- -- -- -- 13.6 --  INR -- -- -- -- 1.02 --  HEPARINUNFRC 0.21* -- 0.30 -- -- --  CREATININE 2.01* -- 2.15* -- -- 2.52*  CKTOTAL -- 99 -- 130 119 --  CKMB -- 2.0 -- 2.3 2.2 --  TROPONINI -- <0.30 -- <0.30 <0.30 --   Estimated Creatinine Clearance: 33.1 ml/min (by C-G formula based on Cr of 2.01).   Assessment: 70 yo female with CP and for r/o ACS/PE on heparin and below goal (heparin level=0.21). Heparin running at 1000 units/hr  Goal of Therapy:  Heparin level 0.3-0.7 units/ml   Plan:  1. Heparin bolus 1000 units IV x1 then increase heparin to 1200 units/hr 2. F/u 8 hr level.  Benny Lennert 11/13/2011,3:24 PM

## 2011-11-13 NOTE — Progress Notes (Signed)
ANTICOAGULATION CONSULT NOTE - Follow Up Consult  Pharmacy Consult for Heparin Indication: chest pain/ACS  Allergies  Allergen Reactions  . Sulfa Antibiotics Itching    Patient Measurements: Height: 5\' 6"  (167.6 cm) Weight: 248 lb 3.8 oz (112.6 kg) IBW/kg (Calculated) : 59.3  Heparin dosing weight: 86 kg  Vital Signs: Temp: 97.8 F (36.6 C) (11/19 0500) Temp src: Oral (11/19 0500) BP: 98/65 mmHg (11/19 0500) Pulse Rate: 87  (11/19 0500)  Labs:  Basename 11/13/11 0555 11/13/11 0553 11/13/11 11/12/11 2210 11/12/11 1844  HGB -- 9.7* -- -- 10.6*  HCT -- 31.9* -- -- 34.4*  PLT -- 236 -- -- 245  APTT -- -- -- 37 --  LABPROT -- -- -- 13.6 --  INR -- -- -- 1.02 --  HEPARINUNFRC -- 0.30 -- -- --  CREATININE -- 2.15* -- -- 2.52*  CKTOTAL 99 -- 130 119 --  CKMB 2.0 -- 2.3 2.2 --  TROPONINI <0.30 -- <0.30 <0.30 --   Estimated Creatinine Clearance: 31 ml/min (by C-G formula based on Cr of 2.15).   Assessment: Pt on therapeutic heparin. No issues noted  Goal of Therapy:  Heparin level 0.3-0.7 units/ml   Plan:  1. Continue heparin at 1000 units/hr 2. F/u 8 hr level to confirm  Yoel Kaufhold, Hilario Quarry 11/13/2011,7:37 AM

## 2011-11-13 NOTE — Progress Notes (Signed)
Subjective:  Complains of right-sided chest pain without any associated symptoms the pain increases with deep breathing denies any fever or chills also complains of leg swelling patient had a mildly elevated d-dimer patient denies any shortness of breath Denies any fever or chills states her urinary symptoms have improved  Objective:  Vital Signs in the last 24 hours: Temp:  [97.6 F (36.4 C)-99.2 F (37.3 C)] 97.8 F (36.6 C) (11/19 0500) Pulse Rate:  [87-100] 87  (11/19 0500) Resp:  [16-19] 18  (11/19 0500) BP: (49-119)/(34-80) 102/68 mmHg (11/19 1044) SpO2:  [95 %-99 %] 99 % (11/19 0500) Weight:  [112.6 kg (248 lb 3.8 oz)] 248 lb 3.8 oz (112.6 kg) (11/18 2213)  Intake/Output from previous day:   Intake/Output from this shift: Total I/O In: 243 [P.O.:240; I.V.:3] Out: -   Physical Exam: General appearance: alert and cooperative Neck: no adenopathy, no carotid bruit, no JVD, supple, symmetrical, trachea midline and thyroid not enlarged, symmetric, no tenderness/mass/nodules Lungs: diminished breath sounds RLL Heart: regular rate and rhythm, S1, S2 normal, no murmur, click, rub or gallop Abdomen: soft, non-tender; bowel sounds normal; no masses,  no organomegaly Extremities: edema 2+  Lab Results:  Basename 11/13/11 0553 11/12/11 1844  WBC 7.2 8.2  HGB 9.7* 10.6*  PLT 236 245    Basename 11/13/11 0553 11/12/11 1844  NA 145 144  K 4.2 4.6  CL 107 106  CO2 27 26  GLUCOSE 95 109*  BUN 45* 51*  CREATININE 2.15* 2.52*    Basename 11/13/11 0555 11/13/11  TROPONINI <0.30 <0.30   Hepatic Function Panel  Basename 11/13/11 0553  PROT 6.5  ALBUMIN 3.1*  AST 13  ALT 9  ALKPHOS 96  BILITOT 0.1*  BILIDIR --  IBILI --    Basename 11/13/11 0553  CHOL 110   No results found for this basename: PROTIME in the last 72 hours  Imaging:   Cardiac Studies:  Assessment/Plan:  Atypical chest pain with mildly elevated d-dimer rule out PE Hypertension History of  CVA Chronic kidney disease Anemia of chronic disease Dementia Gouty arthritis Resolving UTI  Plan Check VQ scan Check labs in a.m. Anemia panel  LOS: 1 day    Franziska Podgurski N 11/13/2011, 1:05 PM

## 2011-11-14 DIAGNOSIS — R609 Edema, unspecified: Secondary | ICD-10-CM

## 2011-11-14 LAB — COMPREHENSIVE METABOLIC PANEL
ALT: 9 U/L (ref 0–35)
Alkaline Phosphatase: 92 U/L (ref 39–117)
BUN: 36 mg/dL — ABNORMAL HIGH (ref 6–23)
CO2: 28 mEq/L (ref 19–32)
Calcium: 9.5 mg/dL (ref 8.4–10.5)
GFR calc Af Amer: 30 mL/min — ABNORMAL LOW (ref >90)
GFR calc non Af Amer: 25 mL/min — ABNORMAL LOW (ref >90)
Glucose, Bld: 121 mg/dL — ABNORMAL HIGH (ref 70–99)
Sodium: 140 mEq/L (ref 135–145)

## 2011-11-14 LAB — DIFFERENTIAL
Basophils Absolute: 0.1 10*3/uL (ref 0.0–0.1)
Eosinophils Absolute: 0.6 10*3/uL (ref 0.0–0.7)
Eosinophils Relative: 8 % — ABNORMAL HIGH (ref 0–5)
Lymphocytes Relative: 20 % (ref 12–46)
Neutrophils Relative %: 67 % (ref 43–77)

## 2011-11-14 LAB — CBC
HCT: 31.1 % — ABNORMAL LOW (ref 36.0–46.0)
Hemoglobin: 9.6 g/dL — ABNORMAL LOW (ref 12.0–15.0)
MCH: 27.9 pg (ref 26.0–34.0)
MCHC: 30.9 g/dL (ref 30.0–36.0)
MCV: 90.4 fL (ref 78.0–100.0)
RDW: 14.5 % (ref 11.5–15.5)

## 2011-11-14 LAB — URINE CULTURE
Colony Count: 75000
Culture  Setup Time: 201211190846

## 2011-11-14 LAB — HEPARIN LEVEL (UNFRACTIONATED)
Heparin Unfractionated: 0.31 IU/mL (ref 0.30–0.70)
Heparin Unfractionated: 0.35 IU/mL (ref 0.30–0.70)

## 2011-11-14 MED ORDER — DEXTROSE 5 % IV SOLN
1.0000 g | INTRAVENOUS | Status: DC
  Administered 2011-11-14 – 2011-11-16 (×3): 1 g via INTRAVENOUS
  Filled 2011-11-14 (×3): qty 10

## 2011-11-14 NOTE — Progress Notes (Signed)
Subjective:  Patient denies any further episodes of right-sided chest pain Denies any shortness of breath Complains of mild leg pain and swelling patient just came back from a duplex ultrasound of the lower extremities the results of which are not available  Objective:  Vital Signs in the last 24 hours: Temp:  [97.7 F (36.5 C)-98.8 F (37.1 C)] 97.7 F (36.5 C) (11/20 0700) Pulse Rate:  [75-87] 75  (11/20 0750) Resp:  [18] 18  (11/20 0700) BP: (86-103)/(48-69) 89/50 mmHg (11/20 0750) SpO2:  [94 %-100 %] 100 % (11/20 0700)  Intake/Output from previous day: 11/19 0701 - 11/20 0700 In: 987 [P.O.:720; I.V.:267] Out: 900 [Urine:900] Intake/Output from this shift:    Physical Exam: General appearance: alert and cooperative Neck: no adenopathy, no carotid bruit, no JVD, supple, symmetrical, trachea midline and thyroid not enlarged, symmetric, no tenderness/mass/nodules Lungs: clear to auscultation bilaterally Heart: regular rate and rhythm, S1, S2 normal, no murmur, click, rub or gallop Abdomen: soft, non-tender; bowel sounds normal; no masses,  no organomegaly Extremities: edema 2+  Lab Results:  Basename 11/14/11 0525 11/13/11 0553  WBC 7.7 7.2  HGB 9.6* 9.7*  PLT 217 236    Basename 11/13/11 1410 11/13/11 0553  NA 145 145  K 4.6 4.2  CL 107 107  CO2 28 27  GLUCOSE 129* 95  BUN 41* 45*  CREATININE 2.01* 2.15*    Basename 11/13/11 0555 11/13/11  TROPONINI <0.30 <0.30   Hepatic Function Panel  Basename 11/13/11 0553  PROT 6.5  ALBUMIN 3.1*  AST 13  ALT 9  ALKPHOS 96  BILITOT 0.1*  BILIDIR --  IBILI --    Basename 11/13/11 0553  CHOL 110   No results found for this basename: PROTIME in the last 72 hours  Imaging:  Cardiac Studies:  Assessment/Plan:  Status post atypical chest pain MI ruled out Leg swelling rule out DVT Resolving UTI CAD stable History of CVA Chronic kidney disease Anemia of chronic disease Dementia Morbid  obesity Degenerative joint disease Hypoalbuminemia  Glucose intolerance Plan Continue present management Check a duplex ultrasound results Check urine culture results  LOS: 2 days    Sowmya Partridge N 11/14/2011, 10:21 AM

## 2011-11-14 NOTE — ED Provider Notes (Signed)
Medical screening examination/treatment/procedure(s) were conducted as a shared visit with non-physician practitioner(s) and myself.  I personally evaluated the patient during the encounter  Donnetta Hutching, MD 11/14/11 [phone]

## 2011-11-14 NOTE — Progress Notes (Signed)
*  PRELIMINARY RESULTS*  Bilateral Lower Venous Dopplers  has been performed.  Preliminary - No obvious evidence of deep vein thrombosis bilaterally. No evidence of Baker's Cyst bilaterally.  Katherine Cooper 11/14/2011, 9:48 AM

## 2011-11-14 NOTE — Progress Notes (Signed)
ANTICOAGULATION CONSULT NOTE - Follow Up Consult  Pharmacy Consult for Heparin Indication: chest pain/ACS/r/o PE  Allergies  Allergen Reactions  . Sulfa Antibiotics Itching    Patient Measurements: Height: 5\' 6"  (167.6 cm) Weight: 248 lb 3.8 oz (112.6 kg) IBW/kg (Calculated) : 59.3  Heparin dosing weight: 86 kg  Vital Signs: Temp: 98.8 F (37.1 C) (11/19 2300) Temp src: Oral (11/19 2300) BP: 86/48 mmHg (11/19 2300) Pulse Rate: 76  (11/19 1651)  Labs:  Basename 11/14/11 0219 11/13/11 1410 11/13/11 0555 11/13/11 0553 11/13/11 11/12/11 2210 11/12/11 1844  HGB -- -- -- 9.7* -- -- 10.6*  HCT -- -- -- 31.9* -- -- 34.4*  PLT -- -- -- 236 -- -- 245  APTT -- -- -- -- -- 37 --  LABPROT -- -- -- -- -- 13.6 --  INR -- -- -- -- -- 1.02 --  HEPARINUNFRC 0.31 0.21* -- 0.30 -- -- --  CREATININE -- 2.01* -- 2.15* -- -- 2.52*  CKTOTAL -- -- 99 -- 130 119 --  CKMB -- -- 2.0 -- 2.3 2.2 --  TROPONINI -- -- <0.30 -- <0.30 <0.30 --   Estimated Creatinine Clearance: 33.1 ml/min (by C-G formula based on Cr of 2.01).   Assessment: 70 yo female with CP and for r/o ACS/PE on heparin.  Heparin level=0.31). Heparin running at 1200 units/hr  Goal of Therapy:  Heparin level 0.3-0.7 units/ml   Plan:  Continue heparin at 1200 units/hr.   Zakariah Dejarnette Poteet 11/14/2011,4:46 AM

## 2011-11-15 LAB — CBC
MCHC: 30.7 g/dL (ref 30.0–36.0)
RDW: 14.6 % (ref 11.5–15.5)
WBC: 7.3 10*3/uL (ref 4.0–10.5)

## 2011-11-15 LAB — HEPARIN LEVEL (UNFRACTIONATED): Heparin Unfractionated: 0.31 IU/mL (ref 0.30–0.70)

## 2011-11-15 NOTE — Progress Notes (Signed)
HEPARIN STOPPED PER MD ORDER

## 2011-11-15 NOTE — Progress Notes (Signed)
ANTICOAGULATION CONSULT NOTE - Follow Up Consult  Pharmacy Consult for Heparin Indication: r/o DVT  Patient Measurements: Height: 5\' 6"  (167.6 cm) Weight: 251 lb 8.7 oz (114.1 kg) IBW/kg (Calculated) : 59.3  Heparin dosing weight: 86 kg  Vital Signs: Temp: 98.3 F (36.8 C) (11/21 0625) Temp src: Oral (11/21 0625) BP: 93/61 mmHg (11/21 0625) Pulse Rate: 90  (11/21 0625)  Labs:  Alvira Philips 11/15/11 1610 11/14/11 1025 11/14/11 0525 11/14/11 0219 11/13/11 1410 11/13/11 0555 11/13/11 0553 11/13/11 11/12/11 2210  HGB 9.8* -- 9.6* -- -- -- -- -- --  HCT 31.9* -- 31.1* -- -- -- 31.9* -- --  PLT 231 -- 217 -- -- -- 236 -- --  APTT -- -- -- -- -- -- -- -- 37  LABPROT -- -- -- -- -- -- -- -- 13.6  INR -- -- -- -- -- -- -- -- 1.02  HEPARINUNFRC 0.31 -- 0.35 0.31 -- -- -- -- --  CREATININE -- 1.91* -- -- 2.01* -- 2.15* -- --  CKTOTAL -- -- -- -- -- 99 -- 130 119  CKMB -- -- -- -- -- 2.0 -- 2.3 2.2  TROPONINI -- -- -- -- -- <0.30 -- <0.30 <0.30   Estimated Creatinine Clearance: 35.1 ml/min (by C-G formula based on Cr of 1.91).   Assessment: 70 yo female with leg swelling on heparin for r/o DVT.  Heparin level=0.31).  Preliminary venous dopplers show no DVT, Heparin running at 1200 units/hr.  Goal of Therapy:  Heparin level 0.3-0.7 units/ml   Plan:  Continue heparin at 1200 units/hr and follow progress and further results.   Benny Lennert 11/15/2011,8:08 AM

## 2011-11-15 NOTE — Progress Notes (Signed)
Subjective:  Patient denies any chest pain or shortness of breath Leg swelling slightly improved Duplex ultrasound of lower extremities negative for DVT  Objective:  Vital Signs in the last 24 hours: Temp:  [97.9 F (36.6 C)-98.3 F (36.8 C)] 98.3 F (36.8 C) (11/21 0625) Pulse Rate:  [88-90] 90  (11/21 0625) Resp:  [18] 18  (11/21 0625) BP: (90-99)/(59-69) 93/61 mmHg (11/21 0625) SpO2:  [96 %-97 %] 96 % (11/21 0625) Weight:  [114.1 kg (251 lb 8.7 oz)] 251 lb 8.7 oz (114.1 kg) (11/21 0625)  Intake/Output from previous day: 11/20 0701 - 11/21 0700 In: 360 [P.O.:360] Out: -  Intake/Output from this shift: Total I/O In: 360 [P.O.:360] Out: 400 [Urine:400]  Physical Exam: General appearance: alert and cooperative Neck: no carotid bruit and no JVD Lungs: clear to auscultation bilaterally Heart: regular rate and rhythm, S1, S2 normal, no murmur, click, rub or gallop Abdomen: soft, non-tender; bowel sounds normal; no masses,  no organomegaly Extremities: edema 2+  Lab Results:  [facility] 11/15/11 0613 11/14/11 0525  WBC 7.3 7.7  HGB 9.8* 9.6*  PLT 231 217    Basename 11/14/11 1025 11/13/11 1410  NA 140 145  K 3.9 4.6  CL 102 107  CO2 28 28  GLUCOSE 121* 129*  BUN 36* 41*  CREATININE 1.91* 2.01*    Basename 11/13/11 0555 11/13/11  TROPONINI <0.30 <0.30   Hepatic Function Panel  Basename 11/14/11 1025  PROT 7.0  ALBUMIN 3.1*  AST 15  ALT 9  ALKPHOS 92  BILITOT 0.1*  BILIDIR --  IBILI --    Basename 11/13/11 0553  CHOL 110   No results found for this basename: PROTIME in the last 72 hours  Imaging:  Cardiac Studies:  Assessment/Plan:  Status post atypical chest pain Resolving UTI Chronic kidney disease Anemia of chronic disease Coronary artery disease History of CVA Glucose intolerance Her chronic leg swelling/hypoalbuminemia Plan Continue present management High protein diet Her TED stockings DC IV heparin Elevate legs OT PT consult  LOS: 3 days    Katherine Cooper N 11/15/2011, 11:35 AM

## 2011-11-16 LAB — CBC
Platelets: 238 10*3/uL (ref 150–400)
RBC: 3.37 MIL/uL — ABNORMAL LOW (ref 3.87–5.11)
WBC: 6.9 10*3/uL (ref 4.0–10.5)

## 2011-11-16 MED ORDER — CIPROFLOXACIN HCL 250 MG PO TABS: 250.0000 mg | ORAL_TABLET | Freq: Two times a day (BID) | ORAL | Status: AC

## 2011-11-16 MED ORDER — ALLOPURINOL 100 MG PO TABS: 100.0000 mg | ORAL_TABLET | Freq: Every day | ORAL | Status: DC

## 2011-11-16 MED ORDER — CLORAZEPATE DIPOTASSIUM 7.5 MG PO TABS: 7.5000 mg | ORAL_TABLET | Freq: Every day | ORAL | Status: DC

## 2011-11-16 MED ORDER — ASPIRIN 81 MG PO TBEC: 81.0000 mg | DELAYED_RELEASE_TABLET | Freq: Every day | ORAL | Status: DC

## 2011-11-16 NOTE — Discharge Summary (Signed)
  Discharge summary on 11/16/2011 dictation number is [id number]

## 2011-11-16 NOTE — Discharge Summary (Signed)
[name], [name]                   ACCOUNT NO.:  [id number]  MEDICAL RECORD NO.:  [id number]  LOCATION:  3705                         FACILITY:  [facility]  PHYSICIAN:  Eduardo Osier. Sharyn Lull, M.D. DATE OF BIRTH:  1941/07/23  DATE OF ADMISSION:  11/12/2011 DATE OF DISCHARGE:  11/16/2011                              DISCHARGE SUMMARY   ADMITTING DIAGNOSES: 1. Chest pain, rule out myocardial infarction. 2. Chronic leg swelling.  FINAL DIAGNOSES: 1. Atypical chest pain with minimally elevated D-dimer, myocardial     infarction ruled out, negative V/Q scan. 2. Resolving urinary tract infection. 3. Hypertension. 4. Gouty arthritis. 5. Morbid obesity. 6. Chronic renal insufficiency. 7. Anemia of chronic disease. 8. History of gastritis.  DISCHARGE HOME MEDICATIONS: 1. Enteric-coated aspirin 81 mg 1 tab daily. 2. Ciprofloxacin 250 mg 1 tablet twice daily for 7 days. 3. Allopurinol dose has been changed to 100 mg 1 tablet daily. 4. Clorazepate 7.5 mg at bedtime. 5. The patient has been advised to continue albuterol 2 puffs every 6     hours as needed. 6. Carvedilol 3.125 mg 1 tablet twice daily. 7. Aricept 10 mg 1 tablet daily at bedtime. 8. Lasix 40 mg 1 tablet twice daily. 9. Vicodin 1 tablet twice daily as needed for pain. 10.Claritin 10 mg daily as needed. 11.Namenda 10 mg 1 tablet twice daily. 12.Omeprazole 20 mg 1 capsule daily. 13.Oxybutynin 5 mg 1 tablet 3 times daily as before. 14.Potassium chloride 20 mEq 1 half tablet by mouth daily. 15.Simvastatin 40 mg 1 tablet daily at bedtime. 16.Carafate 1 g twice daily.  The patient has been advised to stop valsartan in view worsening renal function, which has improved after stopping valsartan.  DIET:  Low-salt, low-cholesterol, heart-healthy diet.  FOLLOWUP:  Follow up with me in 1 week.  Increase activity slowly as tolerated.  CONDITION AT DISCHARGE:  Stable.  BRIEF HISTORY AND HOSPITAL COURSE:  Katherine Cooper is a 70 year old  black female with past medical history significant for hypertension, gouty arthritis, anemia of chronic disease, chronic renal insufficiency, was admitted by Dr. Algie Coffer on November 18 because of one-week history of cough with pleuritic right-sided chest pain associated with mild shortness of breath and arm pain.  There was no sweating, swell, fever, or chills.  The patient was noted to have minimally elevated D-dimers.  PAST MEDICAL HISTORY:  As above.  PAST SURGICAL HISTORY:  She had thyroid surgery in the past.  FAMILY HISTORY:  No family history on the file.  SOCIAL HISTORY:  The patient does not have a smoking history.  She does not have any smoke less tobacco history on file.  She reports that she does not drink alcohol or use any illicit drugs.  ALLERGIES:  She is allergic to SULFA, ANTIBIOTICS she has itching.  PHYSICAL EXAMINATION:  VITAL SIGNS:  Blood pressure 100/69, pulse was 89, temperature was 99.2, O2 sats 98%. GENERAL:  The patient is alert, awake, and oriented x3.  Well nourished. HEENT:  Conjunctiva was pink.  Extraocular movements were intact. Pupils were equal, round, reactive to light.  No scleral icterus. Bilateral lens haziness. NECK:  Supple.  No JVD.  No thyromegaly.  CARDIOVASCULAR EXAM:  Normal rate and regular rhythm.  Soft systolic murmur. LUNGS:  Normal breath sounds.  She has no wheezes.  She had mild tenderness on the right side of the chest. ABDOMEN:  Soft.  Bowel sounds were present.  Nontender.  EXTREMITIES: There is no clubbing or cyanosis.  There was 2+ edema. NEURO:  Grossly intact.  ADMISSION LABS:  Sodium 144, potassium 4.6, BUN was 51, creatinine 2.52, glucose was 109, repeat fasting was 95.  Hemoglobin was 10.6, hematocrit 34.4, white count of 8.2.  Three sets of cardiac enzymes were negative. Repeat electrolytes on November 22, sodium 140, potassium 3.9, BUN 36, creatinine has trended down from 2.5 to 1.91 and glucose was  121, hemoglobin was 9.6, hematocrit 30.6, white count of 6.96.  Urinalysis showed many bacteria, leukocytes, wbc.  Urine culture grew multiple bacterial morphotypes predominant.  BNP was 17.9.  TSH was 1.37 which was in normal range.  D-dimer was elevated 2.75.  The patient had a V/Q scan, which showed no evidence of pulmonary embolism.  The patient also underwent duplex ultrasound of the lower extremities which suggested no evidence of DVD.  Chest x-ray showed hiatal hernia and subsegmental atelectasis.  There was no infiltrate.  BRIEF HOSPITAL COURSE:  The patient was admitted to the telemetry unit. MI was ruled out by serial enzymes and EKG.  The patient did not have any episodes of anginal chest pain during the hospital stay, although the patient did not had right-sided pleuritic pain in view of mildly elevated D-dimers.  The patient subsequently underwent V/Q scan, which showed no evidence of PE and also underwent duplex ultrasound of lower extremities which did not suggest any evidence of DVT.  The patient was started on IV Rocephin which was switched to p.o. Cipro.  The patient remained afebrile during the hospital stay.  The patient will be discharged home on above medications and will be followed up in my office in 1 week.     Eduardo Osier. Sharyn Lull, M.D.     [facility]  D:  11/16/2011  T:  11/16/2011  Job:  [id number]

## 2012-04-01 ENCOUNTER — Emergency Department (HOSPITAL_COMMUNITY)
Admission: EM | Admit: 2012-04-01 | Discharge: 2012-04-01 | Disposition: A | Discharge status: Home / Self Care | Payer: Medicare HMO | Attending: Emergency Medicine | Admitting: Emergency Medicine

## 2012-04-01 ENCOUNTER — Encounter (HOSPITAL_COMMUNITY): Payer: Self-pay | Admitting: *Deleted

## 2012-04-01 DIAGNOSIS — N390 Urinary tract infection, site not specified: Secondary | ICD-10-CM

## 2012-04-01 DIAGNOSIS — M545 Low back pain, unspecified: Secondary | ICD-10-CM | POA: Insufficient documentation

## 2012-04-01 DIAGNOSIS — B354 Tinea corporis: Secondary | ICD-10-CM | POA: Insufficient documentation

## 2012-04-01 LAB — URINALYSIS, ROUTINE W REFLEX MICROSCOPIC
Nitrite: NEGATIVE
Specific Gravity, Urine: 1.02 (ref 1.005–1.030)
pH: 6 (ref 5.0–8.0)

## 2012-04-01 LAB — URINE MICROSCOPIC-ADD ON

## 2012-04-01 MED ORDER — CEPHALEXIN 500 MG PO CAPS: 500.0000 mg | ORAL_CAPSULE | Freq: Four times a day (QID) | ORAL | Status: AC

## 2012-04-01 MED ORDER — TOLNAFTATE 1 % EX CREA: TOPICAL_CREAM | Freq: Two times a day (BID) | CUTANEOUS | Status: DC

## 2012-04-01 MED ORDER — HYDROCODONE-ACETAMINOPHEN 5-325 MG PO TABS
2.0000 | ORAL_TABLET | Freq: Once | ORAL | Status: AC
  Administered 2012-04-01: 2 via ORAL
  Filled 2012-04-01: qty 2

## 2012-04-01 MED ORDER — HYDROCODONE-ACETAMINOPHEN 5-325 MG PO TABS: 2.0000 | ORAL_TABLET | ORAL | Status: DC | PRN

## 2012-04-01 NOTE — Discharge Instructions (Signed)
Take antibiotics as prescribed for the urinary tract infection.   Use the cream on the area under your abdomen where the rash is.  Keep the area clean and dry. You can increased your dose of vicodin to four times a day for your lower back pain and foot pain. Follow up with your primary care physician Dr. Lovell Sheehan.

## 2012-04-01 NOTE — ED Provider Notes (Signed)
Medical screening examination/treatment/procedure(s) were conducted as a shared visit with non-physician practitioner(s) and myself.  I personally evaluated the patient during the encounter  Evaluation is consistent with low back pain due to Arthritis. She has a sub-panniculus, and intergluteal rash, consistent with tinea corporis. She can be treated as an outpatient.    Flint Melter, MD 04/01/12 [phone]

## 2012-04-01 NOTE — ED Notes (Signed)
Pt has gout and arthritis.  Pt is here with lower back pain that is chronic.  Pt sees dr. Lovell Sheehan for this chronic problems. Pt reports pain and burning with urination

## 2012-04-01 NOTE — ED Provider Notes (Signed)
History     CSN: [id number]  Arrival date & time 04/01/12  0410   First MD Initiated Contact with Patient 04/01/12 [phone]      Chief Complaint  Patient presents with  . Back Pain    (Consider location/radiation/quality/duration/timing/severity/associated sxs/prior treatment) HPI Comments: Patient comes in today with a chief complaint of lower back pain and bilateral feet pain.  She reports that she has been diagnosed with arthritis of her lower back and also arthritis of both feet and has had pain for a "long time."  She reports that the pain of her lower back and feet became worse 3 days ago.  No acute trauma or injury.  No fever.  Denies numbness/tingling.  Denies loss of bowel/bladder function.  She currently takes hydrocodone twice a day for pain and takes Allopurinol for Gout.  She also reports that she has had intermittent dysuria for the past month.  Denies hematuria or increased urinary frequency.  The history is provided by the patient.    Past Medical History  Diagnosis Date  . Arthritis   . Hypertension   . Gout   . Obesity   . Angina   . Heart murmur   . Shortness of breath   . Chronic kidney disease   . Seizures   . Stroke   . Recurrent upper respiratory infection (URI)   . Hypothyroidism   . Blood transfusion   . Headache   . Anxiety   . Dysrhythmia   . Anemia   . Hiatal hernia     Past Surgical History  Procedure Date  . Uterine fibroid surgery     No family history on file.  History  Substance Use Topics  . Smoking status: Never Smoker   . Smokeless tobacco: Not on file  . Alcohol Use: No    OB History    Grav Para Term Preterm Abortions TAB SAB Ect Mult Living                  Review of Systems  Constitutional: Negative for fever and chills.  HENT: Negative for neck pain and neck stiffness.   Respiratory: Negative for shortness of breath.   Cardiovascular: Negative for chest pain.  Gastrointestinal: Negative for nausea and vomiting.    Genitourinary: Positive for dysuria. Negative for hematuria and flank pain.  Musculoskeletal: Negative for joint swelling.  Skin: Negative for color change.  Neurological: Negative for weakness and numbness.    Allergies  Sulfa antibiotics  Home Medications   Current Outpatient Rx  Name Route Sig Dispense Refill  . ALBUTEROL SULFATE HFA 108 (90 BASE) MCG/ACT IN AERS Inhalation Inhale 2 puffs into the lungs every 6 (six) hours as needed. For shortness of breath     . ASPIRIN 81 MG PO TBEC Oral Take 1 tablet (81 mg total) by mouth daily. 30 tablet 3  . CARVEDILOL 3.125 MG PO TABS Oral Take 3.125 mg by mouth 2 (two) times daily with a meal.      . CLORAZEPATE DIPOTASSIUM 7.5 MG PO TABS Oral Take 7.5 mg by mouth 3 (three) times daily.    . DONEPEZIL HCL 10 MG PO TABS Oral Take 10 mg by mouth at bedtime.      . FUROSEMIDE 40 MG PO TABS Oral Take 40 mg by mouth 2 (two) times daily.      Marland Kitchen HYDROCODONE-ACETAMINOPHEN 5-500 MG PO TABS Oral Take 1 tablet by mouth 2 (two) times daily as needed. For pain     .  LORATADINE 10 MG PO TABS Oral Take 10 mg by mouth daily.      Marland Kitchen MEMANTINE HCL 10 MG PO TABS Oral Take 10 mg by mouth 2 (two) times daily.      . OXYBUTYNIN CHLORIDE 5 MG PO TABS Oral Take 5 mg by mouth 3 (three) times daily.      Marland Kitchen POTASSIUM CHLORIDE CRYS ER 20 MEQ PO TBCR Oral Take 10 mEq by mouth daily.      Marland Kitchen SIMVASTATIN 40 MG PO TABS Oral Take 40 mg by mouth at bedtime.      . SUCRALFATE 1 G PO TABS Oral Take 1 g by mouth 2 (two) times daily.      . ALLOPURINOL 100 MG PO TABS Oral Take 1 tablet (100 mg total) by mouth daily. 30 tablet 3    BP 136/72  Pulse 84  Temp(Src) 98.2 F (36.8 C) (Oral)  Resp 20  SpO2 98%  Physical Exam  Nursing note and vitals reviewed. Constitutional: She appears well-developed and well-nourished. No distress.  HENT:  Head: Normocephalic and atraumatic.  Mouth/Throat: Oropharynx is clear and moist.  Eyes: EOM are normal. Pupils are equal, round, and  reactive to light.  Neck: Normal range of motion.  Cardiovascular: Normal rate, regular rhythm and normal heart sounds.        1+ bilateral pitting edema of LE  Pulmonary/Chest: Effort normal and breath sounds normal. No respiratory distress. She has no wheezes.  Abdominal: Soft. Bowel sounds are normal. She exhibits no distension and no mass. There is no tenderness. There is no rebound and no guarding.  Musculoskeletal:       Lumbar back: She exhibits decreased range of motion and bony tenderness. She exhibits no swelling, no edema and no deformity.       Stiffness and decreased movement of lower back.  Neurological: She is alert. She has normal strength and normal reflexes. No sensory deficit.  Skin: Skin is warm and dry. She is not diaphoretic. No erythema.       No erythema or warmth of feet bilaterally. Erythema and moisture underneath abdominal fold and underneath both breasts.    Psychiatric: She has a normal mood and affect.    ED Course  Procedures (including critical care time)   Labs Reviewed  URINALYSIS, ROUTINE W REFLEX MICROSCOPIC   No results found.   1. UTI (lower urinary tract infection)   2. Lower back pain   3. Tinea corporis       MDM  Patient presents with chronic lower back pain and chronic bilateral feet pain. No new injury or trauma.  No erythema or warmth on exam.   Patient currently takes hydrocodone twice a day.  Patient told that she can increase dose to 4 times a day and follow up with PCP.  UA consistent with UTI.  Patient given Rx for Keflex.  Patient discussed with Dr. Effie Shy who also evaluated the patient.        Pascal Lux Yarnell, PA-C 04/02/12 1719

## 2012-04-02 LAB — URINE CULTURE

## 2012-04-06 ENCOUNTER — Encounter (HOSPITAL_COMMUNITY): Payer: Self-pay | Admitting: *Deleted

## 2012-04-06 ENCOUNTER — Emergency Department (HOSPITAL_COMMUNITY)
Admission: EM | Admit: 2012-04-06 | Discharge: 2012-04-07 | Disposition: A | Discharge status: Home / Self Care | Payer: Medicare HMO | Attending: Emergency Medicine | Admitting: Emergency Medicine

## 2012-04-06 DIAGNOSIS — R5381 Other malaise: Secondary | ICD-10-CM | POA: Insufficient documentation

## 2012-04-06 DIAGNOSIS — R5383 Other fatigue: Secondary | ICD-10-CM | POA: Insufficient documentation

## 2012-04-06 DIAGNOSIS — M2550 Pain in unspecified joint: Secondary | ICD-10-CM | POA: Insufficient documentation

## 2012-04-06 DIAGNOSIS — M545 Low back pain, unspecified: Secondary | ICD-10-CM | POA: Insufficient documentation

## 2012-04-06 DIAGNOSIS — M1990 Unspecified osteoarthritis, unspecified site: Secondary | ICD-10-CM | POA: Insufficient documentation

## 2012-04-06 DIAGNOSIS — R609 Edema, unspecified: Secondary | ICD-10-CM | POA: Insufficient documentation

## 2012-04-06 DIAGNOSIS — R600 Localized edema: Secondary | ICD-10-CM

## 2012-04-06 DIAGNOSIS — Z79899 Other long term (current) drug therapy: Secondary | ICD-10-CM | POA: Insufficient documentation

## 2012-04-06 DIAGNOSIS — D649 Anemia, unspecified: Secondary | ICD-10-CM

## 2012-04-06 DIAGNOSIS — N289 Disorder of kidney and ureter, unspecified: Secondary | ICD-10-CM | POA: Insufficient documentation

## 2012-04-06 MED ORDER — OXYCODONE-ACETAMINOPHEN 5-325 MG PO TABS
1.0000 | ORAL_TABLET | Freq: Once | ORAL | Status: AC
  Administered 2012-04-07: 1 via ORAL
  Filled 2012-04-06: qty 1

## 2012-04-06 NOTE — ED Notes (Signed)
The pt has been ill for one week .  She is hurting all over she thinks her blood is low .  She was seen here this past Wednesday for the same.  She is constipated also lower back pain

## 2012-04-06 NOTE — ED Notes (Signed)
Pt has multiple complaints. Pt states that she has been having neck, shoulder and back pain that is new and the reason why she was coming to the E.D. Due to inability to get around. Pt daughter then states that she hs worried about her yeast infection in her groin area and her lower leg swelling that is within the last month. Pt states she has gout and artirits.

## 2012-04-06 NOTE — ED Provider Notes (Signed)
Medical screening examination/treatment/procedure(s) were performed by non-physician practitioner and as supervising physician I was immediately available for consultation/collaboration.  Flint Melter, MD 04/06/12 [phone]

## 2012-04-07 ENCOUNTER — Emergency Department (HOSPITAL_COMMUNITY): Payer: Medicare HMO

## 2012-04-07 LAB — CBC
HCT: 31.8 % — ABNORMAL LOW (ref 36.0–46.0)
Hemoglobin: 9.6 g/dL — ABNORMAL LOW (ref 12.0–15.0)
MCH: 27.5 pg (ref 26.0–34.0)
MCHC: 30.2 g/dL (ref 30.0–36.0)
MCV: 91.1 fL (ref 78.0–100.0)
Platelets: 261 K/uL (ref 150–400)
RBC: 3.49 MIL/uL — ABNORMAL LOW (ref 3.87–5.11)
RDW: 15.2 % (ref 11.5–15.5)
WBC: 7.5 K/uL (ref 4.0–10.5)

## 2012-04-07 LAB — BASIC METABOLIC PANEL WITH GFR
BUN: 25 mg/dL — ABNORMAL HIGH (ref 6–23)
CO2: 29 meq/L (ref 19–32)
Calcium: 9.5 mg/dL (ref 8.4–10.5)
Chloride: 106 meq/L (ref 96–112)
Creatinine, Ser: 1.97 mg/dL — ABNORMAL HIGH (ref 0.50–1.10)
GFR calc Af Amer: 28 mL/min — ABNORMAL LOW
GFR calc non Af Amer: 24 mL/min — ABNORMAL LOW
Glucose, Bld: 83 mg/dL (ref 70–99)
Potassium: 4.2 meq/L (ref 3.5–5.1)
Sodium: 143 meq/L (ref 135–145)

## 2012-04-07 MED ORDER — OXYCODONE-ACETAMINOPHEN 5-325 MG PO TABS: 2.0000 | ORAL_TABLET | ORAL | Status: AC | PRN

## 2012-04-07 MED ORDER — HYDROMORPHONE HCL PF 1 MG/ML IJ SOLN
0.5000 mg | Freq: Once | INTRAMUSCULAR | Status: AC
  Administered 2012-04-07: 0.5 mg via INTRAMUSCULAR
  Filled 2012-04-07: qty 1

## 2012-04-07 NOTE — Discharge Instructions (Signed)
Take medications as prescribed. Please see your Dr. for recheck in 2-3 days. Wearing compression stockings may help with your lower extremity edema, or swelling in your legs. Drink plenty of fluids. Return the emergency room for worsening condition or new concerning symptoms  Anemia, Nonspecific Your exam and blood tests show you are anemic. This means your blood (hemoglobin) level is low. Normal hemoglobin values are 12 to 15 g/dL for females and 14 to 17 g/dL for males. Make a note of your hemoglobin level today. The hematocrit percent is also used to measure anemia. A normal hematocrit is 38% to 46% in females and 42% to 49% in males. Make a note of your hematocrit level today. CAUSES  Anemia can be due to many different causes.  Excessive bleeding from periods (in women).   Intestinal bleeding.   Poor nutrition.   Kidney, thyroid, liver, and bone marrow diseases.  SYMPTOMS  Anemia can come on suddenly (acute). It can also come on slowly. Symptoms can include:  Minor weakness.   Dizziness.   Palpitations.   Shortness of breath.  Symptoms may be absent until half your hemoglobin is missing if it comes on slowly. Anemia due to acute blood loss from an injury or internal bleeding may require blood transfusion if the loss is severe. Hospital care is needed if you are anemic and there is significant continual blood loss. TREATMENT   Stool tests for blood (Hemoccult) and additional lab tests are often needed. This determines the best treatment.   Further checking on your condition and your response to treatment is very important. It often takes many weeks to correct anemia.  Depending on the cause, treatment can include:  Supplements of iron.   Vitamins B12 and folic acid.   Hormone medicines.If your anemia is due to bleeding, finding the cause of the blood loss is very important. This will help avoid further problems.  SEEK IMMEDIATE MEDICAL CARE IF:   You develop fainting,  extreme weakness, shortness of breath, or chest pain.   You develop heavy vaginal bleeding.   You develop bloody or black, tarry stools or vomit up blood.   You develop a high fever, rash, repeated vomiting, or dehydration.  Document Released: 01/18/2005 Document Revised: 11/30/2011 Document Reviewed: 10/26/2009 [facility] Patient Information 2012 [place], [place]. Arthralgia Arthralgia is joint pain. A joint is a place where two bones meet. Joint pain can happen for many reasons. The joint can be bruised, stiff, infected, or weak from aging. Pain usually goes away after resting and taking medicine for soreness.  HOME CARE  Rest the joint as told by your doctor.   Keep the sore joint raised (elevated) for the first 24 hours.   Put ice on the joint area.   Put ice in a plastic bag.   Place a towel between your skin and the bag.   Leave the ice on for 15 to 20 minutes, 3 to 4 times a day.   Wear your splint, casting, elastic bandage, or sling as told by your doctor.   Only take medicine as told by your doctor. Do not take aspirin.   Use crutches as told by your doctor. Do not put weight on the joint until told to by your doctor.  GET HELP RIGHT AWAY IF:   You have bruising, puffiness (swelling), or more pain.   Your fingers or toes turn blue or start to lose feeling (numb).   Your medicine does not lessen the pain.   Your pain  becomes severe.   You have a temperature by mouth above 102 F (38.9 C), not controlled by medicine.   You cannot move or use the joint.  MAKE SURE YOU:   Understand these instructions.   Will watch your condition.   Will get help right away if you are not doing well or get worse.  Document Released: 11/29/2009 Document Revised: 11/30/2011 Document Reviewed: 11/29/2009 [facility] Patient Information 2012 [place], [place].Arthritis, Nonspecific Arthritis is pain, redness, warmth, or puffiness (swelling) of a joint. The joint may be stiff or hurt when  you move it. One or more joints may be affected. There are many types of arthritis. Your doctor may not know what type you have right away. The most common cause of arthritis is wear and tear on the joint (osteoarthritis). HOME CARE   Only take medicine as told by your doctor.   Rest the joint as much as possible.   Raise (elevate) your joint if it is puffy.   Use crutches if the painful joint is in your leg.   Drink enough water and fluids to keep your pee (urine) clear or pale yellow.   Follow your doctor's instructions for diet.   Use cold packs for very bad joint pain for 10 to 15 minutes every hour. Ask your doctor if it is okay for you to use hot packs.   Exercise as told by your doctor.   Take a warm shower if you have stiffness in the morning.   Move your sore joints throughout the day.  GET HELP RIGHT AWAY IF:   You do not feel better in 24 hours or are getting worse.   You are having side effects from your medicine.   You are not getting better with treatment.   You have a fever.   You have very bad joint pain, puffiness, or redness.   Many joints become painful and puffy.   You have very bad back pain or leg weakness.   You cannot control when you poop (bowel movement) or pee (urinate).  MAKE SURE YOU:   Understand these instructions.   Will watch your condition.   Will get help right away if you are not doing well or get worse.  Document Released: 03/07/2010 Document Revised: 11/30/2011 Document Reviewed: 03/07/2010 [facility] Patient Information 2012 [place], [place]. Edema Edema is an abnormal build-up of fluids in tissues. Because this is partly dependent on gravity (water flows to the lowest place), it is more common in the leg sand thighs (lower extremities). It is also common in the looser tissues, like around the eyes. Painless swelling of the feet and ankles is common and increases as a person ages. It may affect both legs and may include the calves  or even thighs. When squeezed, the fluid may move out of the affected area and may leave a dent for a few moments. CAUSES   Prolonged standing or sitting in one place for extended periods of time. Movement helps pump tissue fluid into the veins, and absence of movement prevents this, resulting in edema.   Varicose veins. The valves in the veins do not work as well as they should. This causes fluid to leak into the tissues.   Fluid and salt overload.   Injury, burn, or surgery to the leg, ankle, or foot, may damage veins and allow fluid to leak out.   Sunburn damages vessels. Leaky vessels allow fluid to go out into the sunburned tissues.   Allergies (from insect bites  or stings, medications or chemicals) cause swelling by allowing vessels to become leaky.   Protein in the blood helps keep fluid in your vessels. Low protein, as in malnutrition, allows fluid to leak out.   Hormonal changes, including pregnancy and menstruation, cause fluid retention. This fluid may leak out of vessels and cause edema.   Medications that cause fluid retention. Examples are sex hormones, blood pressure medications, steroid treatment, or anti-depressants.   Some illnesses cause edema, especially heart failure, kidney disease, or liver disease.   Surgery that cuts veins or lymph nodes, such as surgery done for the heart or for breast cancer, may result in edema.  DIAGNOSIS  Your caregiver is usually easily able to determine what is causing your swelling (edema) by simply asking what is wrong (getting a history) and examining you (doing a physical). Sometimes x-rays, EKG (electrocardiogram or heart tracing), and blood work may be done to evaluate for underlying medical illness. TREATMENT  General treatment includes:  Leg elevation (or elevation of the affected body part).   Restriction of fluid intake.   Prevention of fluid overload.   Compression of the affected body part. Compression with elastic  bandages or support stockings squeezes the tissues, preventing fluid from entering and forcing it back into the blood vessels.   Diuretics (also called water pills or fluid pills) pull fluid out of your body in the form of increased urination. These are effective in reducing the swelling, but can have side effects and must be used only under your caregiver's supervision. Diuretics are appropriate only for some types of edema.  The specific treatment can be directed at any underlying causes discovered. Heart, liver, or kidney disease should be treated appropriately. HOME CARE INSTRUCTIONS   Elevate the legs (or affected body part) above the level of the heart, while lying down.   Avoid sitting or standing still for prolonged periods of time.   Avoid putting anything directly under the knees when lying down, and do not wear constricting clothing or garters on the upper legs.   Exercising the legs causes the fluid to work back into the veins and lymphatic channels. This may help the swelling go down.   The pressure applied by elastic bandages or support stockings can help reduce ankle swelling.   A low-salt diet may help reduce fluid retention and decrease the ankle swelling.   Take any medications exactly as prescribed.  SEEK MEDICAL CARE IF:  Your edema is not responding to recommended treatments. SEEK IMMEDIATE MEDICAL CARE IF:   You develop shortness of breath or chest pain.   You cannot breathe when you lay down; or if, while lying down, you have to get up and go to the window to get your breath.   You are having increasing swelling without relief from treatment.   You develop a fever over 102 F (38.9 C).   You develop pain or redness in the areas that are swollen.   Tell your caregiver right away if you have gained 3 lb/1.4 kg in 1 day or 5 lb/2.3 kg in a week.  MAKE SURE YOU:   Understand these instructions.   Will watch your condition.   Will get help right away if you  are not doing well or get worse.  Document Released: 12/11/2005 Document Revised: 11/30/2011 Document Reviewed: 07/29/2008 [facility] Patient Information 2012 [place], [place].

## 2012-04-07 NOTE — ED Provider Notes (Signed)
History     CSN: [id number]  Arrival date & time 04/06/12  2007   First MD Initiated Contact with Patient 04/06/12 2308      Chief Complaint  Patient presents with  . multiple complaints     (Consider location/radiation/quality/duration/timing/severity/associated sxs/prior treatment) HPI 71 year old female who presents to the emergency department with multiple complaints. Patient complaining of pain in all joints, low back pain, constipation, worsening pain in her left hip, and left arm. Daughter is concerned about swelling in the patient's lower extremities, and concern that she may have diabetes. Patient was seen for similar complaints on the ninth of this month, was started on Keflex for possible UTI. Patient and daughter report arthralgias and leg swelling has been ongoing for the past year. Patient is followed by Dr. Lovell Sheehan, and sees her every 3 months. At that ED visit on April 9, patient was instructed to increase her hydrocodone to 4 times a day. Patient reports she is doing this, but is getting no pain relief. Patient and family are also concerned about dry skin and itching on lower extremities, and rash under her breasts and below her pannus. Past Medical History  Diagnosis Date  . Arthritis   . Hypertension   . Gout   . Obesity   . Angina   . Heart murmur   . Shortness of breath   . Chronic kidney disease   . Seizures   . Stroke   . Recurrent upper respiratory infection (URI)   . Hypothyroidism   . Blood transfusion   . Headache   . Anxiety   . Dysrhythmia   . Anemia   . Hiatal hernia     Past Surgical History  Procedure Date  . Uterine fibroid surgery     No family history on file.  History  Substance Use Topics  . Smoking status: Never Smoker   . Smokeless tobacco: Not on file  . Alcohol Use: No    OB History    Grav Para Term Preterm Abortions TAB SAB Ect Mult Living                  Review of Systems  Constitutional: Positive for activity  change and fatigue.  Respiratory: Negative for chest tightness and shortness of breath.   Cardiovascular: Positive for leg swelling. Negative for palpitations.  Gastrointestinal: Positive for constipation and abdominal distention.  Musculoskeletal: Positive for myalgias, back pain, joint swelling, arthralgias and gait problem.  All other systems reviewed and are negative.   other than listed in history of present illness  Allergies  Sulfa antibiotics  Home Medications   Current Outpatient Rx  Name Route Sig Dispense Refill  . ALBUTEROL SULFATE HFA 108 (90 BASE) MCG/ACT IN AERS Inhalation Inhale 2 puffs into the lungs every 6 (six) hours as needed. For shortness of breath     . ALLOPURINOL 100 MG PO TABS Oral Take 1 tablet (100 mg total) by mouth daily. 30 tablet 3  . ASPIRIN 81 MG PO TBEC Oral Take 1 tablet (81 mg total) by mouth daily. 30 tablet 3  . CARVEDILOL 3.125 MG PO TABS Oral Take 3.125 mg by mouth 2 (two) times daily with a meal.      . CEPHALEXIN 500 MG PO CAPS Oral Take 1 capsule (500 mg total) by mouth 4 (four) times daily. 28 capsule 0  . CLORAZEPATE DIPOTASSIUM 7.5 MG PO TABS Oral Take 7.5 mg by mouth 3 (three) times daily.    . DONEPEZIL  HCL 10 MG PO TABS Oral Take 10 mg by mouth at bedtime.      . FUROSEMIDE 40 MG PO TABS Oral Take 40 mg by mouth 2 (two) times daily.      Marland Kitchen HYDROCODONE-ACETAMINOPHEN 5-325 MG PO TABS Oral Take 2 tablets by mouth every 4 (four) hours as needed for pain. 10 tablet 0  . HYDROCODONE-ACETAMINOPHEN 5-500 MG PO TABS Oral Take 1 tablet by mouth 2 (two) times daily as needed. For pain     . LORATADINE 10 MG PO TABS Oral Take 10 mg by mouth daily.      Marland Kitchen MEMANTINE HCL 10 MG PO TABS Oral Take 10 mg by mouth 2 (two) times daily.      . OXYBUTYNIN CHLORIDE 5 MG PO TABS Oral Take 5 mg by mouth 3 (three) times daily.      Marland Kitchen POTASSIUM CHLORIDE CRYS ER 20 MEQ PO TBCR Oral Take 10 mEq by mouth daily.      Marland Kitchen SIMVASTATIN 40 MG PO TABS Oral Take 40 mg by  mouth at bedtime.      . SUCRALFATE 1 G PO TABS Oral Take 1 g by mouth 2 (two) times daily.        BP 100/70  Pulse 77  Temp(Src) 97 F (36.1 C) (Oral)  Resp 18  SpO2 98%  Physical Exam  Nursing note and vitals reviewed. Constitutional: She is oriented to person, place, and time. She appears well-developed and well-nourished. No distress.  HENT:  Head: Normocephalic and atraumatic.  Nose: Nose normal.  Mouth/Throat: Oropharynx is clear and moist.  Eyes: Conjunctivae and EOM are normal. Pupils are equal, round, and reactive to light.  Neck: Normal range of motion. Neck supple. No JVD present. No tracheal deviation present. No thyromegaly present.  Cardiovascular: Normal rate, regular rhythm, normal heart sounds and intact distal pulses.  Exam reveals no gallop and no friction rub.   No murmur heard. Pulmonary/Chest: Effort normal and breath sounds normal. No stridor. No respiratory distress. She has no wheezes. She has no rales. She exhibits no tenderness.  Abdominal: Soft. Bowel sounds are normal. She exhibits no distension and no mass. There is no tenderness. There is no rebound and no guarding.  Musculoskeletal: Normal range of motion. She exhibits no tenderness. Edema: 2+ pitting edema to lower extremities to knees.  Lymphadenopathy:    She has no cervical adenopathy.  Neurological: She is oriented to person, place, and time. She exhibits normal muscle tone. Coordination normal.  Skin: Skin is dry. No rash noted. She is not diaphoretic. No erythema. No pallor.       Patient with dry skin with secondary excoriations at posterior lower leg without signs of cellulitis or induration  Psychiatric: She has a normal mood and affect. Her behavior is normal. Judgment and thought content normal.    ED Course  Procedures (including critical care time)  Labs Reviewed  CBC - Abnormal; Notable for the following:    RBC 3.49 (*)    Hemoglobin 9.6 (*)    HCT 31.8 (*)    All other  components within normal limits  BASIC METABOLIC PANEL - Abnormal; Notable for the following:    BUN 25 (*)    Creatinine, Ser 1.97 (*)    GFR calc non Af Amer 24 (*)    GFR calc Af Amer 28 (*)    All other components within normal limits   No results found.   No diagnosis found.  MDM  71 year old female with multiple complaints, most of which need to be followed up with her primary care Dr. Maryclare Labrador check baseline labs, x-rays, and dry giving oxycodone versus hydrocodone for her pain. Patient and daughter reassured that with long-standing ongoing issues no emergent condition is currently thought to exist. They were recommended to get compression stockings for patient's lower extremity edema, and followup with Dr. Lovell Sheehan if x-rays and lab work are reassuring        Olivia Mackie, MD 04/07/12 [phone]

## 2012-04-24 ENCOUNTER — Other Ambulatory Visit: Payer: Self-pay | Admitting: Nephrology

## 2012-04-24 DIAGNOSIS — N183 Chronic kidney disease, stage 3 unspecified: Secondary | ICD-10-CM

## 2012-04-29 ENCOUNTER — Inpatient Hospital Stay: Admission: RE | Admit: 2012-04-29 | Payer: Medicare Other | Source: Ambulatory Visit

## 2012-07-08 ENCOUNTER — Inpatient Hospital Stay (HOSPITAL_COMMUNITY)
Admission: EM | Admit: 2012-07-08 | Discharge: 2012-07-11 | DRG: 194 | Disposition: A | Discharge status: Home / Self Care | Payer: PRIVATE HEALTH INSURANCE | Attending: Internal Medicine | Admitting: Internal Medicine

## 2012-07-08 ENCOUNTER — Encounter (HOSPITAL_COMMUNITY): Payer: Self-pay | Admitting: Emergency Medicine

## 2012-07-08 ENCOUNTER — Emergency Department (HOSPITAL_COMMUNITY): Payer: PRIVATE HEALTH INSURANCE

## 2012-07-08 DIAGNOSIS — E876 Hypokalemia: Secondary | ICD-10-CM | POA: Diagnosis present

## 2012-07-08 DIAGNOSIS — M25529 Pain in unspecified elbow: Secondary | ICD-10-CM

## 2012-07-08 DIAGNOSIS — N184 Chronic kidney disease, stage 4 (severe): Secondary | ICD-10-CM | POA: Diagnosis present

## 2012-07-08 DIAGNOSIS — N189 Chronic kidney disease, unspecified: Secondary | ICD-10-CM | POA: Diagnosis present

## 2012-07-08 DIAGNOSIS — R600 Localized edema: Secondary | ICD-10-CM

## 2012-07-08 DIAGNOSIS — R079 Chest pain, unspecified: Secondary | ICD-10-CM

## 2012-07-08 DIAGNOSIS — J189 Pneumonia, unspecified organism: Principal | ICD-10-CM | POA: Diagnosis present

## 2012-07-08 DIAGNOSIS — N183 Chronic kidney disease, stage 3 unspecified: Secondary | ICD-10-CM | POA: Diagnosis present

## 2012-07-08 DIAGNOSIS — N179 Acute kidney failure, unspecified: Secondary | ICD-10-CM

## 2012-07-08 DIAGNOSIS — M109 Gout, unspecified: Secondary | ICD-10-CM | POA: Diagnosis present

## 2012-07-08 DIAGNOSIS — M1990 Unspecified osteoarthritis, unspecified site: Secondary | ICD-10-CM | POA: Diagnosis present

## 2012-07-08 DIAGNOSIS — E039 Hypothyroidism, unspecified: Secondary | ICD-10-CM | POA: Diagnosis present

## 2012-07-08 DIAGNOSIS — E875 Hyperkalemia: Secondary | ICD-10-CM | POA: Diagnosis present

## 2012-07-08 DIAGNOSIS — E669 Obesity, unspecified: Secondary | ICD-10-CM | POA: Diagnosis present

## 2012-07-08 DIAGNOSIS — I10 Essential (primary) hypertension: Secondary | ICD-10-CM | POA: Diagnosis present

## 2012-07-08 DIAGNOSIS — N289 Disorder of kidney and ureter, unspecified: Secondary | ICD-10-CM

## 2012-07-08 DIAGNOSIS — I129 Hypertensive chronic kidney disease with stage 1 through stage 4 chronic kidney disease, or unspecified chronic kidney disease: Secondary | ICD-10-CM | POA: Diagnosis present

## 2012-07-08 DIAGNOSIS — K219 Gastro-esophageal reflux disease without esophagitis: Secondary | ICD-10-CM | POA: Diagnosis present

## 2012-07-08 DIAGNOSIS — R531 Weakness: Secondary | ICD-10-CM

## 2012-07-08 HISTORY — DX: Pneumonia, unspecified organism: J18.9

## 2012-07-08 LAB — BASIC METABOLIC PANEL
BUN: 57 mg/dL — ABNORMAL HIGH (ref 6–23)
Creatinine, Ser: 2.53 mg/dL — ABNORMAL HIGH (ref 0.50–1.10)
GFR calc Af Amer: 21 mL/min — ABNORMAL LOW (ref >90)
GFR calc non Af Amer: 18 mL/min — ABNORMAL LOW (ref >90)

## 2012-07-08 LAB — HEPATIC FUNCTION PANEL
ALT: 23 U/L (ref 0–35)
Bilirubin, Direct: <0.1 mg/dL (ref 0.0–0.3)
Total Bilirubin: 0.1 mg/dL — ABNORMAL LOW (ref 0.3–1.2)

## 2012-07-08 LAB — CBC
HCT: 36.5 % (ref 36.0–46.0)
MCH: 27.6 pg (ref 26.0–34.0)
MCHC: 31.2 g/dL (ref 30.0–36.0)
MCV: 88 fL (ref 78.0–100.0)
MCV: 88.5 fL (ref 78.0–100.0)
Platelets: 235 10*3/uL (ref 150–400)
RDW: 15.1 % (ref 11.5–15.5)
RDW: 15.2 % (ref 11.5–15.5)

## 2012-07-08 LAB — URINALYSIS, ROUTINE W REFLEX MICROSCOPIC
Ketones, ur: NEGATIVE mg/dL
Protein, ur: NEGATIVE mg/dL
Urobilinogen, UA: 0.2 mg/dL (ref 0.0–1.0)

## 2012-07-08 LAB — POCT I-STAT TROPONIN I: Troponin i, poc: 0.01 ng/mL (ref 0.00–0.08)

## 2012-07-08 LAB — URINE MICROSCOPIC-ADD ON

## 2012-07-08 MED ORDER — DEXTROSE 5 % IV SOLN
1.0000 g | INTRAVENOUS | Status: DC
  Administered 2012-07-09 – 2012-07-10 (×2): 1 g via INTRAVENOUS
  Filled 2012-07-08 (×3): qty 10

## 2012-07-08 MED ORDER — SODIUM CHLORIDE 0.9 % IV SOLN
Freq: Once | INTRAVENOUS | Status: AC
  Administered 2012-07-08: 16:00:00 via INTRAVENOUS

## 2012-07-08 MED ORDER — SODIUM CHLORIDE 0.9 % IV BOLUS (SEPSIS)
500.0000 mL | Freq: Once | INTRAVENOUS | Status: AC
  Administered 2012-07-08: 500 mL via INTRAVENOUS

## 2012-07-08 MED ORDER — PANTOPRAZOLE SODIUM 40 MG PO TBEC
40.0000 mg | DELAYED_RELEASE_TABLET | Freq: Every day | ORAL | Status: DC
  Administered 2012-07-08: 40 mg via ORAL
  Filled 2012-07-08: qty 1

## 2012-07-08 MED ORDER — ALBUTEROL SULFATE (5 MG/ML) 0.5% IN NEBU
2.5000 mg | INHALATION_SOLUTION | Freq: Four times a day (QID) | RESPIRATORY_TRACT | Status: DC
  Administered 2012-07-08 – 2012-07-09 (×3): 2.5 mg via RESPIRATORY_TRACT
  Filled 2012-07-08 (×3): qty 0.5

## 2012-07-08 MED ORDER — DEXTROSE 5 % IV SOLN
500.0000 mg | Freq: Once | INTRAVENOUS | Status: AC
  Administered 2012-07-08: 500 mg via INTRAVENOUS
  Filled 2012-07-08: qty 500

## 2012-07-08 MED ORDER — ONDANSETRON HCL 4 MG/2ML IJ SOLN: 4.0000 mg | Freq: Four times a day (QID) | INTRAMUSCULAR | Status: DC | PRN

## 2012-07-08 MED ORDER — SODIUM POLYSTYRENE SULFONATE 15 GM/60ML PO SUSP
30.0000 g | Freq: Once | ORAL | Status: AC
  Administered 2012-07-08: 30 g via ORAL
  Filled 2012-07-08: qty 120

## 2012-07-08 MED ORDER — MEMANTINE HCL 10 MG PO TABS
10.0000 mg | ORAL_TABLET | Freq: Two times a day (BID) | ORAL | Status: DC
  Administered 2012-07-08 – 2012-07-11 (×6): 10 mg via ORAL
  Filled 2012-07-08 (×8): qty 1

## 2012-07-08 MED ORDER — SODIUM CHLORIDE 0.9 % IV SOLN: Freq: Once | INTRAVENOUS | Status: DC

## 2012-07-08 MED ORDER — ACETAMINOPHEN 325 MG PO TABS
650.0000 mg | ORAL_TABLET | Freq: Once | ORAL | Status: AC
  Administered 2012-07-08: 650 mg via ORAL
  Filled 2012-07-08: qty 2

## 2012-07-08 MED ORDER — DONEPEZIL HCL 10 MG PO TABS
10.0000 mg | ORAL_TABLET | Freq: Every day | ORAL | Status: DC
  Administered 2012-07-08 – 2012-07-10 (×3): 10 mg via ORAL
  Filled 2012-07-08 (×4): qty 1

## 2012-07-08 MED ORDER — ONDANSETRON HCL 4 MG/2ML IJ SOLN: 4.0000 mg | Freq: Three times a day (TID) | INTRAMUSCULAR | Status: AC | PRN

## 2012-07-08 MED ORDER — ALLOPURINOL 300 MG PO TABS
300.0000 mg | ORAL_TABLET | Freq: Every day | ORAL | Status: DC
  Administered 2012-07-08 – 2012-07-11 (×3): 300 mg via ORAL
  Filled 2012-07-08 (×4): qty 1

## 2012-07-08 MED ORDER — AZITHROMYCIN 500 MG PO TABS
500.0000 mg | ORAL_TABLET | Freq: Every day | ORAL | Status: DC
Start: 2012-07-08 — End: 2012-07-08
  Filled 2012-07-08: qty 1

## 2012-07-08 MED ORDER — DEXTROSE 5 % IV SOLN
1.0000 g | Freq: Once | INTRAVENOUS | Status: AC
  Administered 2012-07-08: 1 g via INTRAVENOUS
  Filled 2012-07-08: qty 10

## 2012-07-08 MED ORDER — ACETAMINOPHEN 325 MG PO TABS
650.0000 mg | ORAL_TABLET | Freq: Four times a day (QID) | ORAL | Status: DC | PRN
  Administered 2012-07-09: 650 mg via ORAL
  Filled 2012-07-08: qty 2

## 2012-07-08 MED ORDER — HYDROCODONE-ACETAMINOPHEN 5-325 MG PO TABS
1.0000 | ORAL_TABLET | ORAL | Status: DC | PRN
  Administered 2012-07-08 – 2012-07-11 (×8): 1 via ORAL
  Filled 2012-07-08 (×9): qty 1

## 2012-07-08 MED ORDER — MORPHINE SULFATE 2 MG/ML IJ SOLN
2.0000 mg | INTRAMUSCULAR | Status: DC | PRN
  Administered 2012-07-08 – 2012-07-09 (×2): 2 mg via INTRAVENOUS
  Filled 2012-07-08 (×2): qty 1

## 2012-07-08 MED ORDER — ONDANSETRON HCL 4 MG PO TABS: 4.0000 mg | ORAL_TABLET | Freq: Four times a day (QID) | ORAL | Status: DC | PRN

## 2012-07-08 MED ORDER — SODIUM CHLORIDE 0.9 % IV SOLN
INTRAVENOUS | Status: DC
  Administered 2012-07-08: 19:00:00 via INTRAVENOUS
  Administered 2012-07-09: 250 mL via INTRAVENOUS
  Administered 2012-07-09: 05:00:00 via INTRAVENOUS

## 2012-07-08 MED ORDER — SODIUM CHLORIDE 0.9 % IV SOLN: INTRAVENOUS | Status: AC

## 2012-07-08 MED ORDER — HEPARIN SODIUM (PORCINE) 5000 UNIT/ML IJ SOLN
5000.0000 [IU] | Freq: Three times a day (TID) | INTRAMUSCULAR | Status: DC
  Administered 2012-07-08 – 2012-07-11 (×8): 5000 [IU] via SUBCUTANEOUS
  Filled 2012-07-08 (×11): qty 1

## 2012-07-08 MED ORDER — AZITHROMYCIN 500 MG PO TABS
500.0000 mg | ORAL_TABLET | Freq: Every day | ORAL | Status: DC
  Administered 2012-07-09 – 2012-07-11 (×3): 500 mg via ORAL
  Filled 2012-07-08 (×3): qty 1

## 2012-07-08 MED ORDER — CARVEDILOL 3.125 MG PO TABS
3.1250 mg | ORAL_TABLET | Freq: Two times a day (BID) | ORAL | Status: DC
  Administered 2012-07-08 – 2012-07-11 (×6): 3.125 mg via ORAL
  Filled 2012-07-08 (×8): qty 1

## 2012-07-08 MED ORDER — ACETAMINOPHEN 650 MG RE SUPP: 650.0000 mg | Freq: Four times a day (QID) | RECTAL | Status: DC | PRN

## 2012-07-08 MED ORDER — SUCRALFATE 1 G PO TABS
1.0000 g | ORAL_TABLET | Freq: Two times a day (BID) | ORAL | Status: DC
  Administered 2012-07-08 – 2012-07-11 (×6): 1 g via ORAL
  Filled 2012-07-08 (×7): qty 1

## 2012-07-08 MED ORDER — POLYETHYLENE GLYCOL 3350 17 G PO PACK
17.0000 g | PACK | Freq: Every day | ORAL | Status: DC | PRN
  Filled 2012-07-08: qty 1

## 2012-07-08 MED ORDER — ALBUTEROL SULFATE (5 MG/ML) 0.5% IN NEBU: 2.5000 mg | INHALATION_SOLUTION | RESPIRATORY_TRACT | Status: DC | PRN

## 2012-07-08 MED ORDER — CLORAZEPATE DIPOTASSIUM 7.5 MG PO TABS
7.5000 mg | ORAL_TABLET | Freq: Three times a day (TID) | ORAL | Status: DC
  Administered 2012-07-08 – 2012-07-11 (×8): 7.5 mg via ORAL
  Filled 2012-07-08 (×8): qty 2

## 2012-07-08 MED ORDER — OXYBUTYNIN CHLORIDE 5 MG PO TABS
5.0000 mg | ORAL_TABLET | Freq: Three times a day (TID) | ORAL | Status: DC
  Administered 2012-07-08 – 2012-07-11 (×8): 5 mg via ORAL
  Filled 2012-07-08 (×10): qty 1

## 2012-07-08 NOTE — H&P (Signed)
[facility] History and Physical  DEAUNNA OLARTE RUE:[id number] DOB: [birth year] DOA: 07/08/2012   PCP: Ron Parker, MD   Chief Complaint: Generalized malaise and shortness of breath  HPI:  This is a 71 year old female with past medical history of hypertension and CKD that comes in for generalized pain and shortness of breath 1 day prior to admission. She relates this started gradually progressively getting worse to the point where she can even walk without being short of breath. She relates she has not had any cough. No sick contacts. She has no travel history. She has no diarrhea chest pain nausea or vomiting. She relates no fevers at home.  Review of Systems:  Constitutional:  No weight loss, night sweats, Fevers, chills.  HEENT:  No headaches, Difficulty swallowing,Tooth/dental problems,Sore throat,  No sneezing, itching, ear ache, nasal congestion, post nasal drip,  Cardio-vascular:  No chest pain, Orthopnea, PND, swelling in lower extremities, anasarca, dizziness, palpitations  GI:  No heartburn, indigestion, abdominal pain, nausea, vomiting, diarrhea, change in bowel habits, loss of appetite  Resp:  . No excess mucus, no productive cough, No non-productive cough, No coughing up of blood.No change in color of mucus.No wheezing.No chest wall deformity  Skin:  no rash or lesions.  GU:  no dysuria, change in color of urine, no urgency or frequency. No flank pain.  Musculoskeletal:  No joint pain or swelling. No decreased range of motion. No back pain.  Psych:  No change in mood or affect. No depression or anxiety. No memory loss.    Past Medical History  Diagnosis Date  . Arthritis   . Hypertension   . Gout   . Obesity   . Angina   . Heart murmur   . Chronic kidney disease   . Stroke   . Recurrent upper respiratory infection (URI)   . Hypothyroidism   . Blood transfusion   . Headache   . Anxiety   . Dysrhythmia   . Anemia   . CAP (community acquired  pneumonia)    Past Surgical History  Procedure Date  . Uterine fibroid surgery    Social History:  reports that she has been smoking Cigarettes.  She has been smoking about .25 packs per day. She does not have any smokeless tobacco history on file. She reports that she drinks about 1.2 ounces of alcohol per week. She reports that she does not use illicit drugs.  Allergies  Allergen Reactions  . Sulfa Antibiotics Itching    Family History  Problem Relation Age of Onset  . Asthma Mother   . Heart attack Father     Prior to Admission medications   Medication Sig Start Date End Date Taking? Authorizing Provider  albuterol (PROVENTIL HFA;VENTOLIN HFA) 108 (90 BASE) MCG/ACT inhaler Inhale 2 puffs into the lungs every 6 (six) hours as needed. For shortness of breath    Yes Historical Provider, MD  allopurinol (ZYLOPRIM) 300 MG tablet Take 300 mg by mouth daily.   Yes Historical Provider, MD  carvedilol (COREG) 3.125 MG tablet Take 3.125 mg by mouth 2 (two) times daily with a meal.     Yes Historical Provider, MD  clorazepate (TRANXENE) 7.5 MG tablet Take 7.5 mg by mouth 3 (three) times daily.   Yes Historical Provider, MD  donepezil (ARICEPT) 10 MG tablet Take 10 mg by mouth at bedtime.     Yes Historical Provider, MD  furosemide (LASIX) 40 MG tablet Take 40 mg by mouth 2 (two) times daily.  Yes Historical Provider, MD  HYDROcodone-acetaminophen (VICODIN) 5-500 MG per tablet Take 1 tablet by mouth 2 (two) times daily as needed. For pain   Yes Historical Provider, MD  loratadine (CLARITIN) 10 MG tablet Take 10 mg by mouth daily.     Yes Historical Provider, MD  memantine (NAMENDA) 10 MG tablet Take 10 mg by mouth 2 (two) times daily.     Yes Historical Provider, MD  Multiple Vitamin (MULTIVITAMIN WITH MINERALS) TABS Take 1 tablet by mouth daily.   Yes Historical Provider, MD  omeprazole (PRILOSEC) 20 MG capsule Take 20 mg by mouth daily with breakfast.   Yes Historical Provider, MD    oxybutynin (DITROPAN) 5 MG tablet Take 5 mg by mouth 3 (three) times daily.     Yes Historical Provider, MD  potassium chloride SA (K-DUR,KLOR-CON) 20 MEQ tablet Take 10 mEq by mouth daily.     Yes Historical Provider, MD  simvastatin (ZOCOR) 40 MG tablet Take 40 mg by mouth at bedtime.     Yes Historical Provider, MD  sucralfate (CARAFATE) 1 G tablet Take 1 g by mouth 2 (two) times daily.     Yes Historical Provider, MD  valsartan (DIOVAN) 320 MG tablet Take 320 mg by mouth daily.   Yes Historical Provider, MD   Physical Exam: Filed Vitals:   07/08/12 1545 07/08/12 1600 07/08/12 1615 07/08/12 1630  BP: 94/69 106/70 112/60 104/70  Pulse: 92 94 94   Temp:      TempSrc:      Resp: 24 20 19 22   SpO2: 98% 99% 99%    BP 104/70  Pulse 94  Temp 99.8 F (37.7 C) (Rectal)  Resp 22  SpO2 99%  General Appearance:    Alert, cooperative, no distress, appears stated age  Head:    Normocephalic, without obvious abnormality, atraumatic, wearing glasses   Eyes:    PERRL, conjunctiva/corneas clear, EOM's intact, fundi    benign, both eyes  Ears:    Normal TM's and external ear canals, both ears  Nose:   Nares normal, septum midline, mucosa normal, no drainage    or sinus tenderness  Throat:   dry mucous membranes lips and tongue is   Neck:   Supple, symmetrical, trachea midline, no adenopathy;    thyroid:  no enlargement/tenderness/nodules; no carotid   bruit or JVD  Back:     Symmetric, no curvature, ROM normal, no CVA tenderness  Lungs:     Clear to auscultation bilaterally, respirations unlabored  Chest Wall:    No tenderness or deformity   Heart:    Regular rate and rhythm, S1 and S2 normal, no murmur, rub   or gallop     Abdomen:     Soft, non-tender, bowel sounds active all four quadrants,    no masses, no organomegaly        Extremities:   Extremities normal, atraumatic, no cyanosis or edema  Pulses:   2+ and symmetric all extremities  Skin:   Skin color, texture, turgor normal, no  rashes or lesions  Lymph nodes:   Cervical, supraclavicular, and axillary nodes normal  Neurologic:   CNII-XII intact, normal strength, sensation and reflexes    throughout    Labs on Admission:  Basic Metabolic Panel:  Lab 07/08/12 1191  NA 139  K 5.2*  CL 102  CO2 24  GLUCOSE 100*  BUN 57*  CREATININE 2.53*  CALCIUM 9.6  MG --  PHOS --   Liver Function Tests:  Lab 07/08/12 1322  AST 14  ALT 23  ALKPHOS 108  BILITOT 0.1*  PROT 7.3  ALBUMIN 3.5    Lab 07/08/12 1322  LIPASE 18  AMYLASE --   No results found for this basename: AMMONIA:5 in the last 168 hours CBC:  Lab 07/08/12 1322  WBC 10.8*  NEUTROABS --  HGB 11.4*  HCT 36.5  MCV 88.0  PLT 247   Cardiac Enzymes: No results found for this basename: CKTOTAL:5,CKMB:5,CKMBINDEX:5,TROPONINI:5 in the last 168 hours BNP: No components found with this basename: POCBNP:5 CBG:  Lab 07/08/12 1654  GLUCAP 83    Radiological Exams on Admission: Dg Chest 2 View  07/08/2012  *RADIOLOGY REPORT*  Clinical Data: Generalized weakness, chest pain, shortness of breath and body aches.  CHEST - 2 VIEW  Comparison: 11/12/2011.  Findings: Trachea is midline.  Heart is mildly enlarged.  Mild pleural parenchymal scarring and volume loss at the base of the left hemithorax. Possible air space opacification in the right suprahilar region.  Difficult to exclude a tiny right pleural effusion.  IMPRESSION:  1.  Possible air space opacification in the right suprahilar region versus rotation and prominent vascularity.  Follow-up PA and lateral views of the chest in 4-6 weeks recommended. 2.  Possible tiny right pleural effusion.  Original Report Authenticated By: Reyes Ivan, M.D.    EKG: Independently reviewed. Pending at the time of this dictation  Assessment/Plan: Active Problems:  PNA (pneumonia): We'll go ahead and admit her to med surg unit started on Rocephin and azithromycin get blood cultures and sputum culture also get  a swallowing evaluation as is on the right side and she relates this has happened before on the right side. We'll start on IV fluids. And will continue monitor vitals closely. Also start her on oxygen. She is not wheezing at this time we'll use albuterol as needed. She doesn't have a history of COPD but she used to smoke.  HTN (hypertension) -Blood pressure has improved since admission. And resume her Coreg for tomorrow morning. We'll hold her ACE and start her on IV fluids.  Hyperkalemia -This likely secondary to acute kidney injury and potassium replacement and also being on an ACE inhibitor. We'll go ahead and stop his get a stat EKG and start on IV fluids aggressively and check a basic metabolic panel in the morning.  Acute on chronic kidney failure -This most likely secondary to Lasix and ACE inhibitor. We'll go ahead and stop these. One is her on IV fluids gently and will check a basic metabolic panel in the morning.  Time spend: Greater than 35 minutes Code Status: Full code Family Communication: Daughter [phone] Disposition Plan: To be determined  Marinda Elk, MD  [facility] Pager [phone]  If 7PM-7AM, please contact night-coverage [email or web address] Password [email or web address] 07/08/2012, 5:50 PM

## 2012-07-08 NOTE — ED Provider Notes (Signed)
Medical screening examination/treatment/procedure(s) were conducted as a shared visit with non-physician practitioner(s) and myself.  I personally evaluated the patient during the encounter   Dione Booze, MD 07/08/12 [phone]

## 2012-07-08 NOTE — ED Notes (Signed)
Pt c/o generalized body aches with pain in back x 3 days; pt warm to touch and BP noted to be 88/59; pt sts cold with head congestion but denies cough; pt sts generalized weakness

## 2012-07-08 NOTE — ED Notes (Signed)
Daughter Clydie Braun would like to be called with any changes to pt.  [phone]

## 2012-07-08 NOTE — Progress Notes (Signed)
Patient oriented to room and made comfortable. Patient alert and oriented x4. No distress Patient skin intact and is a full code. POC explain to patient and she verbalized understanding. Bed alarm set

## 2012-07-08 NOTE — ED Provider Notes (Signed)
71 year old female comes in with 3 days of generalized body aches. She denies cough but states there is some chest discomfort when she takes a breath. She's had chills but is not certain if she's had fever or not. She is also had some itching in her legs. On exam, blood pressures noted to be borderline low and she is mildly tachycardic. Lungs are clear and heart has regular rate and rhythm. Legs have excoriations from scratching but I cannot actually see an underlying rash. There is no edema. Laboratory testing shows significant rise in BUN and creatinine compared with baseline, although, she has been at similar levels in the past. WBC is slightly elevated and chest x-ray shows questionable area of infiltrate. This may be a viral syndrome, but I feel she needs to be treated with antibiotics given the possible infiltrate. She also will need careful fluid management to properly hydrate her with out hitting her fluid overloaded. She will need hospitalization to accomplish this.   Date: 07/08/2012  Rate: 98  Rhythm: normal sinus rhythm  QRS Axis: left  Intervals: normal  ST/T Wave abnormalities: normal  Conduction Disutrbances:none  Narrative Interpretation: Borderline left axis deviation. When compared with ECG of 11/13/2011, no significant changes are seen.  Old EKG Reviewed: unchanged    Dione Booze, MD 07/08/12 [phone]

## 2012-07-08 NOTE — ED Provider Notes (Signed)
History     CSN: [id number]  Arrival date & time 07/08/12  1227   First MD Initiated Contact with Patient 07/08/12 1319      Chief Complaint  Patient presents with  . Arm Pain  . Generalized Body Aches  . Hypotension    (Consider location/radiation/quality/duration/timing/severity/associated sxs/prior treatment) HPI Comments: Pt is a 71 yo female who presented to ED with complaint of generalized malaise, body aches, back pain. States she having chills, nasal congestion, sinus pressure. Denies sore throat or cough. Denies chest pain, abdominal pain, nausea, vomiting. No headache. States some burning with urination. Denies taking any medications for this complaint.   Past Medical History  Diagnosis Date  . Arthritis   . Hypertension   . Gout   . Obesity   . Angina   . Heart murmur   . Shortness of breath   . Chronic kidney disease   . Seizures   . Stroke   . Recurrent upper respiratory infection (URI)   . Hypothyroidism   . Blood transfusion   . Headache   . Anxiety   . Dysrhythmia   . Anemia   . Hiatal hernia     Past Surgical History  Procedure Date  . Uterine fibroid surgery     History reviewed. No pertinent family history.  History  Substance Use Topics  . Smoking status: Never Smoker   . Smokeless tobacco: Not on file  . Alcohol Use: No    OB History    Grav Para Term Preterm Abortions TAB SAB Ect Mult Living                  Review of Systems  Constitutional: Positive for chills. Negative for fever, diaphoresis and appetite change.  HENT: Positive for congestion and sinus pressure. Negative for sore throat, neck pain, neck stiffness and dental problem.   Respiratory: Negative.   Cardiovascular: Negative.   Gastrointestinal: Negative.   Genitourinary: Negative for dysuria and flank pain.  Musculoskeletal: Positive for myalgias, back pain and arthralgias.  Skin: Negative.   Neurological: Positive for weakness. Negative for dizziness, numbness  and headaches.  Hematological: Negative for adenopathy.    Allergies  Sulfa antibiotics  Home Medications   Current Outpatient Rx  Name Route Sig Dispense Refill  . ALBUTEROL SULFATE HFA 108 (90 BASE) MCG/ACT IN AERS Inhalation Inhale 2 puffs into the lungs every 6 (six) hours as needed. For shortness of breath     . ALLOPURINOL 100 MG PO TABS Oral Take 1 tablet (100 mg total) by mouth daily. 30 tablet 3  . COLCHICINE 0.6 MG PO TABS Oral Take 0.6 mg by mouth 3 (three) times daily.    Marland Kitchen HYDROCODONE-ACETAMINOPHEN 5-500 MG PO TABS Oral Take 1 tablet by mouth 2 (two) times daily as needed. For pain    . ADULT MULTIVITAMIN W/MINERALS CH Oral Take 1 tablet by mouth daily.    Marland Kitchen CARVEDILOL 3.125 MG PO TABS Oral Take 3.125 mg by mouth 2 (two) times daily with a meal.      . CLORAZEPATE DIPOTASSIUM 7.5 MG PO TABS Oral Take 7.5 mg by mouth 3 (three) times daily.    . DONEPEZIL HCL 10 MG PO TABS Oral Take 10 mg by mouth at bedtime.      . FUROSEMIDE 40 MG PO TABS Oral Take 40 mg by mouth 2 (two) times daily.      Marland Kitchen LORATADINE 10 MG PO TABS Oral Take 10 mg by mouth daily.      Marland Kitchen  MEMANTINE HCL 10 MG PO TABS Oral Take 10 mg by mouth 2 (two) times daily.      . OXYBUTYNIN CHLORIDE 5 MG PO TABS Oral Take 5 mg by mouth 3 (three) times daily.      Marland Kitchen POTASSIUM CHLORIDE CRYS ER 20 MEQ PO TBCR Oral Take 10 mEq by mouth daily.      Marland Kitchen SIMVASTATIN 40 MG PO TABS Oral Take 40 mg by mouth at bedtime.      . SUCRALFATE 1 G PO TABS Oral Take 1 g by mouth 2 (two) times daily.        BP 101/68  Pulse 99  Temp 99.8 F (37.7 C) (Rectal)  Resp 16  SpO2 99%  Physical Exam  Nursing note and vitals reviewed. Constitutional: She is oriented to person, place, and time. She appears well-developed and well-nourished. No distress.  HENT:  Head: Normocephalic and atraumatic.  Right Ear: External ear normal.  Left Ear: External ear normal.  Nose: Nose normal.  Mouth/Throat: Oropharynx is clear and moist.       Tender  with palpation over frontal and maxillary sinuses  Eyes: Conjunctivae are normal.  Neck: Normal range of motion. Neck supple.  Cardiovascular: Normal rate, regular rhythm and normal heart sounds.   Pulmonary/Chest: Effort normal and breath sounds normal. No respiratory distress. She has no wheezes. She has no rales.  Abdominal: Soft. Bowel sounds are normal. She exhibits no distension. There is no tenderness.  Musculoskeletal: Normal range of motion. She exhibits no edema.  Lymphadenopathy:    She has no cervical adenopathy.  Neurological: She is alert and oriented to person, place, and time.  Skin: Skin is warm and dry.  Psychiatric: She has a normal mood and affect.    ED Course  Procedures (including critical care time)  Pt with generalized body aches, sinus pressure. BP marginally low 88/59, temp 99.4, will get fluids started, labs pending.   Results for orders placed during the hospital encounter of 07/08/12  CBC      Component Value Range   WBC 10.8 (*) 4.0 - 10.5 K/uL   RBC 4.15  3.87 - 5.11 MIL/uL   Hemoglobin 11.4 (*) 12.0 - 15.0 g/dL   HCT 16.1  09.6 - 04.5 %   MCV 88.0  78.0 - 100.0 fL   MCH 27.5  26.0 - 34.0 pg   MCHC 31.2  30.0 - 36.0 g/dL   RDW 40.9  81.1 - 91.4 %   Platelets 247  150 - 400 K/uL  BASIC METABOLIC PANEL      Component Value Range   Sodium 139  135 - 145 mEq/L   Potassium 5.2 (*) 3.5 - 5.1 mEq/L   Chloride 102  96 - 112 mEq/L   CO2 24  19 - 32 mEq/L   Glucose, Bld 100 (*) 70 - 99 mg/dL   BUN 57 (*) 6 - 23 mg/dL   Creatinine, Ser 7.82 (*) 0.50 - 1.10 mg/dL   Calcium 9.6  8.4 - 95.6 mg/dL   GFR calc non Af Amer 18 (*) >90 mL/min   GFR calc Af Amer 21 (*) >90 mL/min  HEPATIC FUNCTION PANEL      Component Value Range   Total Protein 7.3  6.0 - 8.3 g/dL   Albumin 3.5  3.5 - 5.2 g/dL   AST 14  0 - 37 U/L   ALT 23  0 - 35 U/L   Alkaline Phosphatase 108  39 - 117 U/L  Total Bilirubin 0.1 (*) 0.3 - 1.2 mg/dL   Bilirubin, Direct <1.6  0.0 - 0.3  mg/dL   Indirect Bilirubin NOT CALCULATED  0.3 - 0.9 mg/dL  LIPASE, BLOOD      Component Value Range   Lipase 18  11 - 59 U/L  URINALYSIS, ROUTINE W REFLEX MICROSCOPIC      Component Value Range   Color, Urine YELLOW  YELLOW   APPearance CLEAR  CLEAR   Specific Gravity, Urine 1.014  1.005 - 1.030   pH 7.5  5.0 - 8.0   Glucose, UA NEGATIVE  NEGATIVE mg/dL   Hgb urine dipstick SMALL (*) NEGATIVE   Bilirubin Urine NEGATIVE  NEGATIVE   Ketones, ur NEGATIVE  NEGATIVE mg/dL   Protein, ur NEGATIVE  NEGATIVE mg/dL   Urobilinogen, UA 0.2  0.0 - 1.0 mg/dL   Nitrite NEGATIVE  NEGATIVE   Leukocytes, UA TRACE (*) NEGATIVE  POCT I-STAT TROPONIN I      Component Value Range   Troponin i, poc 0.01  0.00 - 0.08 ng/mL   Comment 3           URINE MICROSCOPIC-ADD ON      Component Value Range   Squamous Epithelial / LPF RARE  RARE   WBC, UA 0-2  <3 WBC/hpf   RBC / HPF 0-2  <3 RBC/hpf   Dg Chest 2 View  07/08/2012  *RADIOLOGY REPORT*  Clinical Data: Generalized weakness, chest pain, shortness of breath and body aches.  CHEST - 2 VIEW  Comparison: 11/12/2011.  Findings: Trachea is midline.  Heart is mildly enlarged.  Mild pleural parenchymal scarring and volume loss at the base of the left hemithorax. Possible air space opacification in the right suprahilar region.  Difficult to exclude a tiny right pleural effusion.  IMPRESSION:  1.  Possible air space opacification in the right suprahilar region versus rotation and prominent vascularity.  Follow-up PA and lateral views of the chest in 4-6 weeks recommended. 2.  Possible tiny right pleural effusion.  Original Report Authenticated By: Reyes Ivan, M.D.    3:21 PM Pt with possible pneumonia on CXR, rocephin and zithromax ordered. BP and HR improving with fluids. Tylenol ordered for pain and low grade fever. Will get pt admitted for renal insufficiently, generalized weakness, possible CAP.  3:49 PM Spoke with [facility], will admit.    1. CAP  (community acquired pneumonia)   2. Weakness   3. Renal insufficiency   4. Hyperkalemia       MDM          Lottie Mussel, PA 07/08/12 1549

## 2012-07-09 DIAGNOSIS — R5383 Other fatigue: Secondary | ICD-10-CM

## 2012-07-09 DIAGNOSIS — M25529 Pain in unspecified elbow: Secondary | ICD-10-CM

## 2012-07-09 LAB — COMPREHENSIVE METABOLIC PANEL
BUN: 38 mg/dL — ABNORMAL HIGH (ref 6–23)
CO2: 24 mEq/L (ref 19–32)
Calcium: 9.1 mg/dL (ref 8.4–10.5)
Creatinine, Ser: 1.89 mg/dL — ABNORMAL HIGH (ref 0.50–1.10)
GFR calc Af Amer: 30 mL/min — ABNORMAL LOW (ref >90)
GFR calc non Af Amer: 26 mL/min — ABNORMAL LOW (ref >90)
Glucose, Bld: 110 mg/dL — ABNORMAL HIGH (ref 70–99)

## 2012-07-09 LAB — CBC
Hemoglobin: 9.7 g/dL — ABNORMAL LOW (ref 12.0–15.0)
MCHC: 30.4 g/dL (ref 30.0–36.0)
RBC: 3.63 MIL/uL — ABNORMAL LOW (ref 3.87–5.11)
WBC: 8.6 10*3/uL (ref 4.0–10.5)

## 2012-07-09 LAB — LEGIONELLA ANTIGEN, URINE: Legionella Antigen, Urine: NEGATIVE

## 2012-07-09 MED ORDER — PREDNISONE 20 MG PO TABS
40.0000 mg | ORAL_TABLET | Freq: Every day | ORAL | Status: DC
  Administered 2012-07-10 – 2012-07-11 (×2): 40 mg via ORAL
  Filled 2012-07-09 (×3): qty 2

## 2012-07-09 MED ORDER — METHYLPREDNISOLONE SODIUM SUCC 125 MG IJ SOLR
60.0000 mg | Freq: Two times a day (BID) | INTRAMUSCULAR | Status: AC
  Administered 2012-07-09 (×2): 60 mg via INTRAVENOUS
  Filled 2012-07-09 (×2): qty 2

## 2012-07-09 MED ORDER — MORPHINE SULFATE 2 MG/ML IJ SOLN
2.0000 mg | INTRAMUSCULAR | Status: DC | PRN
  Administered 2012-07-10 (×2): 2 mg via INTRAVENOUS
  Filled 2012-07-09 (×2): qty 1

## 2012-07-09 MED ORDER — ALBUTEROL SULFATE (5 MG/ML) 0.5% IN NEBU: 2.5000 mg | INHALATION_SOLUTION | RESPIRATORY_TRACT | Status: DC | PRN

## 2012-07-09 MED ORDER — PANTOPRAZOLE SODIUM 40 MG PO TBEC
40.0000 mg | DELAYED_RELEASE_TABLET | Freq: Every day | ORAL | Status: DC
  Administered 2012-07-09 – 2012-07-11 (×3): 40 mg via ORAL
  Filled 2012-07-09 (×3): qty 1

## 2012-07-09 NOTE — Evaluation (Signed)
Physical Therapy Evaluation Patient Details Name: Katherine Cooper MRN: [id number] DOB: Nov 30, 1941 Today's Date: 07/09/2012 Time: 4098-1191 PT Time Calculation (min): 33 min  PT Assessment / Plan / Recommendation Clinical Impression  Pt admitted with SOB and PNA. Pt reports she wants more socialization and is upset that she isn't close to her kids like she was to her mom. Pt even mentioned she would like to be in a SNF just so someone is around all the time. Pt demonstrates the ability to ambulate and transfer without assist with increased time and encouraged to increase mobility so she would feel like she could be involved in more activities. Pt will benefit from acute therapy to maximize mobility before return home.     PT Assessment  Patient needs continued PT services    Follow Up Recommendations  No PT follow up    Barriers to Discharge None      Equipment Recommendations  None recommended by PT    Recommendations for Other Services     Frequency Min 2X/week    Precautions / Restrictions Precautions Precautions: Fall   Pertinent Vitals/Pain Left shoulder pain 4/10      Mobility  Bed Mobility Bed Mobility: Supine to Sit;Sit to Supine Supine to Sit: 6: Modified independent (Device/Increase time);HOB flat Sit to Supine: 6: Modified independent (Device/Increase time);HOB flat Details for Bed Mobility Assistance: increased time to complete transfers Transfers Transfers: Sit to Stand;Stand to Sit Sit to Stand: 5: Supervision;From bed;From chair/3-in-1 Stand to Sit: 5: Supervision;To bed;To chair/3-in-1 Details for Transfer Assistance: increased time for standing from both surfaces worse from chair than bed of higher surface with momentum and armrests to stand from chair. Pt states increased difficulty with standing due to shoulder pain Ambulation/Gait Ambulation/Gait Assistance: 5: Supervision Ambulation Distance (Feet): 180 Feet Assistive device: Rolling  walker Ambulation/Gait Assistance Details: cueing to step into RW and extend trunk Gait Pattern: Step-through pattern;Trunk flexed;Decreased stride length Gait velocity: decreased    Exercises     PT Diagnosis: Difficulty walking  PT Problem List: Decreased activity tolerance;Decreased knowledge of use of DME PT Treatment Interventions: Gait training;DME instruction;Functional mobility training;Therapeutic activities;Patient/family education   PT Goals Acute Rehab PT Goals PT Goal Formulation: With patient/family Time For Goal Achievement: 07/16/12 Potential to Achieve Goals: Fair Pt will go Sit to Stand: with modified independence PT Goal: Sit to Stand - Progress: Goal set today Pt will go Stand to Sit: with modified independence PT Goal: Stand to Sit - Progress: Goal set today Pt will Ambulate: >150 feet;with modified independence;with least restrictive assistive device PT Goal: Ambulate - Progress: Goal set today  Visit Information  Last PT Received On: 07/09/12 Assistance Needed: +1    Subjective Data  Subjective: I just want to be around people. Patient Stated Goal: Maybe go to a nursing home so I'm not alone all the time.   Prior Functioning  Home Living Lives With: Alone Available Help at Discharge: Available PRN/intermittently Type of Home: Apartment Home Access: Level entry Home Layout: One level Bathroom Shower/Tub: Engineer, manufacturing systems: Standard Home Adaptive Equipment: Walker - rolling;Straight cane;Bedside commode/3-in-1 Prior Function Level of Independence: Needs assistance Needs Assistance: Light Housekeeping Light Housekeeping: Moderate Driving: No Comments: aide 1-3, 7days a wk who only does heavy housework and grocery shopping Communication Communication: No difficulties    Cognition  Overall Cognitive Status: Appears within functional limits for tasks assessed/performed Arousal/Alertness: Awake/alert Orientation Level: Appears intact  for tasks assessed Behavior During Session: [facility] for tasks performed  Extremity/Trunk Assessment Right Lower Extremity Assessment RLE ROM/Strength/Tone: [facility] for tasks assessed Left Lower Extremity Assessment LLE ROM/Strength/Tone: [facility] for tasks assessed   Balance Static Sitting Balance Static Sitting - Balance Support: Feet supported;No upper extremity supported Static Sitting - Level of Assistance: 7: Independent Static Sitting - Comment/# of Minutes: 3 Static Standing Balance Static Standing - Balance Support: Bilateral upper extremity supported Static Standing - Level of Assistance: 6: Modified independent (Device/Increase time) Static Standing - Comment/# of Minutes: 2  End of Session PT - End of Session Activity Tolerance: Patient tolerated treatment well Patient left: in chair;with call bell/phone within reach;with family/visitor present Nurse Communication: Mobility status  GP     Delorse Lek 07/09/2012, 3:27 PM  Delaney Meigs, PT [phone]

## 2012-07-09 NOTE — Progress Notes (Signed)
[facility] PROGRESS NOTE  Katherine HACKLEMAN ZOX:[id number] DOB: August 12, 1941 DOA: 07/08/2012   Assessment/Plan: Patient Active Hospital Problem List: PNA (pneumonia) (07/08/2012) -mild temperature overnight, she is on Rocephin and azithromycin, her leukocytosis has resolved. She's tolerating her diet well and her shortness of breath is improved. We'll continue current treatment.  -Get a PT OT consult .  Elbow pain secondary to acute gout flare  -Her elbow swollen and erythematous, is exquisitely tender to touch, and she is not able to move her elbow and shoulder. We'll go ahead and start her on empiric treatment with prednisone. And will reevaluate in the morning. -Also probably contributing to his pain his her osteoarthritis.  Hyperkalemia (07/08/2012) -most likely secondary to potassium supplement and acute kidney injury. Her hyperkalemia resolved with IV fluids.  HTN (hypertension) (07/08/2012) -continues to be well controlled continue current management.   Acute on chronic kidney failure (07/08/2012) -is most likely secondary to her diuretic and decreased by mouth intake. We'll continue to hold her diuretics. -Resolved, creatinine close to baseline.   Code Status: Full code Family Communication: Daughter [phone] Disposition Plan: To be determined     LOS: 1 day   Procedures:  None  Antibiotics: Rocephin and azithromycin started on 07/08/2012  Subjective: She relates her shortness of breath is much better. She relates also her generalized malaise is much improved than yesterday. She's been complaining of left elbow pain. She relates is exquisitely tender to palpation. Even minor movements cause her pain to get worse.  Objective: Filed Vitals:   07/08/12 2138 07/09/12 0454 07/09/12 0602 07/09/12 0858  BP: 103/65 111/73    Pulse:  107    Temp:  101.7 F (38.7 C) 101.2 F (38.4 C)   TempSrc:  Oral    Resp:  22    Height:      Weight:      SpO2:  94%  95%     Intake/Output Summary (Last 24 hours) at 07/09/12 1032 Last data filed at 07/09/12 0630  Gross per 24 hour  Intake   1455 ml  Output   1300 ml  Net    155 ml   Weight change:   Exam:  General: Alert, awake, oriented x3, in no acute distress.  HEENT: No bruits, no goiter.  Heart: Regular rate and rhythm, without murmurs, rubs, gallops.  Lungs: Good air movement, bilateral air movement.  Abdomen: Soft, nontender, nondistended, positive bowel sounds.  Extremity: Her left elbow is exquisitely tender to palpation, is warm to touch and erythematous. She's not able to mobilize destroyed without any pain.   Data Reviewed: Basic Metabolic Panel:  Lab 07/09/12 5621 07/08/12 1942 07/08/12 1322  NA 141 -- 139  K 4.7 -- 5.2*  CL 107 -- 102  CO2 24 -- 24  GLUCOSE 110* -- 100*  BUN 38* -- 57*  CREATININE 1.89* 2.27* 2.53*  CALCIUM 9.1 -- 9.6  MG -- -- --  PHOS -- -- --   Liver Function Tests:  Lab 07/09/12 0631 07/08/12 1322  AST 11 14  ALT 16 23  ALKPHOS 79 108  BILITOT 0.1* 0.1*  PROT 5.9* 7.3  ALBUMIN 2.8* 3.5    Lab 07/08/12 1322  LIPASE 18  AMYLASE --   No results found for this basename: AMMONIA:5 in the last 168 hours CBC:  Lab 07/09/12 0631 07/08/12 1942 07/08/12 1322  WBC 8.6 9.0 10.8*  NEUTROABS -- -- --  HGB 9.7* 11.0* 11.4*  HCT 31.9* 35.3* 36.5  MCV 87.9  88.5 88.0  PLT 222 235 247   Cardiac Enzymes: No results found for this basename: CKTOTAL:5,CKMB:5,CKMBINDEX:5,TROPONINI:5 in the last 168 hours BNP: No components found with this basename: POCBNP:5 CBG:  Lab 07/08/12 1654  GLUCAP 83    No results found for this or any previous visit (from the past 240 hour(s)).   Studies: Dg Chest 2 View  07/08/2012  *RADIOLOGY REPORT*  Clinical Data: Generalized weakness, chest pain, shortness of breath and body aches.  CHEST - 2 VIEW  Comparison: 11/12/2011.  Findings: Trachea is midline.  Heart is mildly enlarged.  Mild pleural parenchymal scarring and  volume loss at the base of the left hemithorax. Possible air space opacification in the right suprahilar region.  Difficult to exclude a tiny right pleural effusion.  IMPRESSION:  1.  Possible air space opacification in the right suprahilar region versus rotation and prominent vascularity.  Follow-up PA and lateral views of the chest in 4-6 weeks recommended. 2.  Possible tiny right pleural effusion.  Original Report Authenticated By: Reyes Ivan, M.D.    Scheduled Meds:   . sodium chloride   Intravenous Once  . sodium chloride   Intravenous STAT  . acetaminophen  650 mg Oral Once  . allopurinol  300 mg Oral Daily  . azithromycin (ZITHROMAX) 500 MG IVPB  500 mg Intravenous Once  . azithromycin  500 mg Oral Daily  . carvedilol  3.125 mg Oral BID WC  . cefTRIAXone (ROCEPHIN)  IV  1 g Intravenous Once  . cefTRIAXone (ROCEPHIN)  IV  1 g Intravenous Q24H  . clorazepate  7.5 mg Oral TID  . donepezil  10 mg Oral QHS  . heparin  5,000 Units Subcutaneous Q8H  . memantine  10 mg Oral BID  . methylPREDNISolone (SOLU-MEDROL) injection  60 mg Intravenous Q12H  . oxybutynin  5 mg Oral TID  . pantoprazole  40 mg Oral Q1200  . predniSONE  40 mg Oral Q breakfast  . sodium chloride  500 mL Intravenous Once  . sodium polystyrene  30 g Oral Once  . sucralfate  1 g Oral BID  . DISCONTD: sodium chloride   Intravenous Once  . DISCONTD: albuterol  2.5 mg Nebulization Q6H  . DISCONTD: azithromycin  500 mg Oral Daily  . DISCONTD: pantoprazole  40 mg Oral Q1200   Continuous Infusions:   . sodium chloride 75 mL/hr at 07/09/12 0515    Lambert Keto, MD  [facility] Pager [phone]  If 7PM-7AM, please contact night-coverage [email or web address] Password [email or web address] 07/09/2012, 10:32 AM

## 2012-07-09 NOTE — Progress Notes (Signed)
Notified Craige Cotta, NP by text page that patient's temp is 101.2 after giving tynenol. No new orders given. Will continue to monitor patient. Nelda Marseille, RN

## 2012-07-09 NOTE — Evaluation (Signed)
Clinical/Bedside Swallow Evaluation Patient Details  Name: Katherine Cooper MRN: [id number] Date of Birth: 14-Jul-1941  Today's Date: 07/09/2012 Time: 1025-1040 SLP Time Calculation (min): 15 min  Past Medical History:  Past Medical History  Diagnosis Date  . Arthritis   . Hypertension   . Gout   . Obesity   . Angina   . Heart murmur   . Chronic kidney disease   . Stroke   . Recurrent upper respiratory infection (URI)   . Hypothyroidism   . Blood transfusion   . Headache   . Anxiety   . Dysrhythmia   . Anemia   . CAP (community acquired pneumonia)    Past Surgical History:  Past Surgical History  Procedure Date  . Uterine fibroid surgery    HPI:  This is a 71 year old female with past medical history of hypertension and CKD that comes in for generalized pain and shortness of breath 1 day prior to admission. She relates this started gradually progressively getting worse to the point where she can even walk without being short of breath. Diagnosed with acute right sided PNA. Swallow evaluation ordered due to recurrent right sided PNA.    Assessment / Plan / Recommendation Clinical Impression  Patient presents with what appears to be normal oropharyngeal swallowing function. No overt indication of aspiration observed. Timing of swallow initiation and hyo-laryngeal elevation and excursion appear WFL. Suspect esophageal dysfunction as primary nature of dysfunction given c/o globus, slow esophageal transit (mid sternal chest pain when eating), and occassional regurgitation, which may be contributing to recurrent PNAs. SLP will f/u briefly at bedside as patient is largely independent with use of strategies however defer further esophageal w/u to MD with consideration of barium swallow to assess esophageal motility.     Aspiration Risk  Mild    Diet Recommendation Regular;Thin liquid   Liquid Administration via: Cup;Straw Medication Administration: Whole meds with liquid Supervision:  Patient able to self feed Compensations: Slow rate;Small sips/bites;Follow solids with liquid Postural Changes and/or Swallow Maneuvers: Seated upright 90 degrees;Upright 30-60 min after meal    Other  Recommendations Recommended Consults: Consider esophageal assessment Oral Care Recommendations: Oral care BID   Follow Up Recommendations  None    Frequency and Duration min 2x/week  1 week   Pertinent Vitals/Pain n/a    SLP Swallow Goals Patient will consume recommended diet without observed clinical signs of aspiration with: Independent assistance Swallow Study Goal #1 - Progress: Not Met Patient will utilize recommended strategies during swallow to increase swallowing safety with: Independent assistance Swallow Study Goal #2 - Progress: Not met   Swallow Study    General HPI: This is a 71 year old female with past medical history of hypertension and CKD that comes in for generalized pain and shortness of breath 1 day prior to admission. She relates this started gradually progressively getting worse to the point where she can even walk without being short of breath. Diagnosed with acute right sided PNA. Swallow evaluation ordered due to recurrent right sided PNA.  Type of Study: Bedside swallow evaluation Diet Prior to this Study: NPO Temperature Spikes Noted: No Respiratory Status: Room air History of Recent Intubation: No Behavior/Cognition: Alert;Cooperative;Pleasant mood Oral Cavity - Dentition: Dentures, top (missing bottom dentition) Self-Feeding Abilities: Able to feed self Patient Positioning: Upright in bed Baseline Vocal Quality: Clear Volitional Cough: Strong Volitional Swallow: Able to elicit    Oral/Motor/Sensory Function Overall Oral Motor/Sensory Function: Appears within functional limits for tasks assessed   Ice Chips Ice chips:  Not tested   Thin Liquid Thin Liquid: Within functional limits Presentation: Cup;Straw;Self Fed    Nectar Thick Nectar Thick  Liquid: Not tested   Honey Thick Honey Thick Liquid: Not tested   Puree Puree: Within functional limits Presentation: Spoon   Solid Solid: Within functional limits Presentation: Self Fed   Katherine Amy MA, CCC-SLP [phone]  [name] Katherine Cooper 07/09/2012,11:14 AM

## 2012-07-09 NOTE — Care Management Note (Signed)
    Page 1 of 1   07/11/2012     3:20:01 PM   CARE MANAGEMENT NOTE 07/11/2012  Patient:  Katherine Cooper, Katherine Cooper   Account Number:  [id number]  Date Initiated:  07/09/2012  Documentation initiated by:  Letha Cape  Subjective/Objective Assessment:   dx pna  admit- lives alone, has home health aide with [facility].     Action/Plan:   pt eval- no pt follow.   Anticipated DC Date:  07/11/2012   Anticipated DC Plan:  HOME W HOME HEALTH SERVICES      DC Planning Services  CM consult      Choice offered to / List presented to:             Status of service:  Completed, signed off Medicare Important Message given?   (If response is "NO", the following Medicare IM given date fields will be blank) Date Medicare IM given:   Date Additional Medicare IM given:    Discharge Disposition:  HOME/SELF CARE  Per UR Regulation:  Reviewed for med. necessity/level of care/duration of stay  If discussed at Long Length of Stay Meetings, dates discussed:    Comments:  07/11/12 15:17 Letha Cape RN, BSN [phone] patient dc to home, patient has an aide with [facility], called [facility] to inform them patient is being dc today, they will have patient's aide at her home tomorrow. [facility] states patient's aide is at her home until 3 pm.  07/09/12 16:48 Letha Cape RN, BSN [phone] patient lives alone, patient has an aide with [facility] who helps her to bath.  Per physical therapy patient has no pt follow recs.  Patient has medication coverage and transportation.  NCM will continue to follow for dc needs.

## 2012-07-09 NOTE — Progress Notes (Signed)
Notified Craige Cotta, NP that patient has temp of 101.7. Stated to give tynenol as ordered for fever. No new orders. Will continue to monitor patient. Jerrye Bushy

## 2012-07-10 DIAGNOSIS — M109 Gout, unspecified: Secondary | ICD-10-CM

## 2012-07-10 MED ORDER — FUROSEMIDE 40 MG PO TABS
40.0000 mg | ORAL_TABLET | Freq: Two times a day (BID) | ORAL | Status: DC
  Administered 2012-07-10 – 2012-07-11 (×3): 40 mg via ORAL
  Filled 2012-07-10 (×4): qty 1

## 2012-07-10 NOTE — Progress Notes (Addendum)
PATIENT DETAILS Name: Katherine Cooper Age: 71 y.o. Sex: female Date of Birth: 1941-11-27 Admit Date: 07/08/2012 [id number] C, MD  Subjective: Feels much better today, less left elbow pain  Objective: Vital signs in last 24 hours: Filed Vitals:   07/09/12 0858 07/09/12 2017 07/10/12 0410 07/10/12 0733  BP:  104/68 133/88 100/66  Pulse:  86 83 81  Temp:  98.8 F (37.1 C) 98.4 F (36.9 C)   TempSrc:  Oral Oral   Resp:  18 20   Height:  5\' 6"  (1.676 m)    Weight:  104.6 kg (230 lb 9.6 oz)    SpO2: 95% 97% 98%     Assessment/Plan: Active Problems:  PNA (pneumonia) -Now afebrile -Continue with Rocephin and Zithromax -seen by Speech-no overt signs of aspiration -O2 sat in the High 90's on Room air  Acute Gouty Flare -involving left elbow -better today with Prednisone   Hyperkalemia -Resolved -2/2 to Acute on CKD and Diovan therapy as outpatient   HTN (hypertension) -continue with Coreg -resume Lasix -will not resume Diovan at this time-given hyperkalemia   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  Gen Exam: Awake and alert with clear speech.   Neck: Supple, No JVD.   Chest: B/L Clear.   CVS: S1 S2 Regular, no murmurs.  Abdomen: soft, BS +, non tender, non distended.  Extremities: no edema, lower extremities warm to touch. Neurologic: Non Focal.   Skin: No Rash.   Wounds: N/A.    Intake/Output from previous day:  Intake/Output Summary (Last 24 hours) at 07/10/12 1252 Last data filed at 07/10/12 0500  Gross per 24 hour  Intake    490 ml  Output     200 ml  Net    290 ml    CONSULTS:  None  LAB RESULTS: CBC  Lab 07/09/12 0631 07/08/12 1942 07/08/12 1322  WBC 8.6 9.0 10.8*  HGB 9.7* 11.0* 11.4*  HCT 31.9* 35.3* 36.5  PLT 222 235 247  MCV 87.9 88.5 88.0  MCH 26.7 27.6 27.5  MCHC 30.4 31.2 31.2  RDW 15.1 15.2 15.1  LYMPHSABS -- -- --  MONOABS -- -- --  EOSABS -- -- --  BASOSABS -- -- --  BANDABS -- -- --    Chemistries   Lab 07/09/12 0631 07/08/12 1942 07/08/12 1322  NA 141 -- 139  K 4.7 -- 5.2*  CL 107 -- 102  CO2 24 -- 24  GLUCOSE 110* -- 100*  BUN 38* -- 57*  CREATININE 1.89* 2.27* 2.53*  CALCIUM 9.1 -- 9.6  MG -- -- --    CBG:  Lab 07/08/12 1654  GLUCAP 83    GFR Estimated Creatinine Clearance: 33.4 ml/min (by C-G formula based on Cr of 1.89).  Coagulation profile No results found for this basename: INR:5,PROTIME:5 in the last 168 hours  Cardiac Enzymes No results found for this basename: CK:3,CKMB:3,TROPONINI:3,MYOGLOBIN:3 in the last 168 hours  No components found with this basename: POCBNP:3 No results found for this basename: DDIMER:2 in the last 72 hours No results found for this basename: HGBA1C:2 in the last 72 hours No results found  for this basename: CHOL:2,HDL:2,LDLCALC:2,TRIG:2,CHOLHDL:2,LDLDIRECT:2 in the last 72 hours No results found for this basename: TSH,T4TOTAL,FREET3,T3FREE,THYROIDAB in the last 72 hours No results found for this basename: VITAMINB12:2,FOLATE:2,FERRITIN:2,TIBC:2,IRON:2,RETICCTPCT:2 in the last 72 hours  Basename 07/08/12 1322  LIPASE 18  AMYLASE --    Urine Studies No results found for this basename: UACOL:2,UAPR:2,USPG:2,UPH:2,UTP:2,UGL:2,UKET:2,UBIL:2,UHGB:2,UNIT:2,UROB:2,ULEU:2,UEPI:2,UWBC:2,URBC:2,UBAC:2,CAST:2,CRYS:2,UCOM:2,BILUA:2 in the last 72 hours  MICROBIOLOGY: Recent Results (from the past 240 hour(s))  CULTURE, BLOOD (ROUTINE X 2)     Status: Normal (Preliminary result)   Collection Time   07/08/12  7:20 PM      Component Value Range  Status Comment   Specimen Description BLOOD ARM LEFT   Final    Special Requests BOTTLES DRAWN AEROBIC AND ANAEROBIC 10CC   Final    Culture  Setup Time 07/09/2012 02:48   Final    Culture     Final    Value:        BLOOD CULTURE RECEIVED NO GROWTH TO DATE CULTURE WILL BE HELD FOR 5 DAYS BEFORE ISSUING A FINAL NEGATIVE REPORT   Report Status PENDING   Incomplete   CULTURE, BLOOD (ROUTINE X 2)     Status: Normal (Preliminary result)   Collection Time   07/08/12  7:35 PM      Component Value Range Status Comment   Specimen Description BLOOD ARM LEFT   Final    Special Requests BOTTLES DRAWN AEROBIC AND ANAEROBIC 10CC   Final    Culture  Setup Time 07/09/2012 02:48   Final    Culture     Final    Value:        BLOOD CULTURE RECEIVED NO GROWTH TO DATE CULTURE WILL BE HELD FOR 5 DAYS BEFORE ISSUING A FINAL NEGATIVE REPORT   Report Status PENDING   Incomplete     RADIOLOGY STUDIES/RESULTS: Dg Chest 2 View  07/08/2012  *RADIOLOGY REPORT*  Clinical Data: Generalized weakness, chest pain, shortness of breath and body aches.  CHEST - 2 VIEW  Comparison: 11/12/2011.  Findings: Trachea is midline.  Heart is mildly enlarged.  Mild pleural parenchymal scarring and volume loss at the base of the left hemithorax. Possible air space opacification in the right suprahilar region.  Difficult to exclude a tiny right pleural effusion.  IMPRESSION:  1.  Possible air space opacification in the right suprahilar region versus rotation and prominent vascularity.  Follow-up PA and lateral views of the chest in 4-6 weeks recommended. 2.  Possible tiny right pleural effusion.  Original Report Authenticated By: Reyes Ivan, M.D.    MEDICATIONS: Scheduled Meds:   . sodium chloride   Intravenous STAT  . allopurinol  300 mg Oral Daily  . azithromycin  500 mg Oral Daily  . carvedilol  3.125 mg Oral BID WC  . cefTRIAXone (ROCEPHIN)  IV  1 g Intravenous Q24H  . clorazepate  7.5 mg Oral TID  . donepezil  10 mg Oral  QHS  . heparin  5,000 Units Subcutaneous Q8H  . memantine  10 mg Oral BID  . methylPREDNISolone (SOLU-MEDROL) injection  60 mg Intravenous Q12H  . oxybutynin  5 mg Oral TID  . pantoprazole  40 mg Oral Q1200  . predniSONE  40 mg Oral Q breakfast  . sucralfate  1 g Oral BID   Continuous Infusions:   . sodium chloride 20 mL/hr at 07/10/12 0500   PRN Meds:.acetaminophen, acetaminophen, albuterol, HYDROcodone-acetaminophen, morphine injection, ondansetron (ZOFRAN) IV, ondansetron, polyethylene glycol  Antibiotics: Anti-infectives     Start  Dose/Rate Route Frequency Ordered Stop   07/09/12 1500   cefTRIAXone (ROCEPHIN) 1 g in dextrose 5 % 50 mL IVPB        1 g 100 mL/hr over 30 Minutes Intravenous Every 24 hours 07/08/12 1754 07/16/12 1459   07/09/12 1000   azithromycin (ZITHROMAX) tablet 500 mg        500 mg Oral Daily 07/08/12 2006 07/16/12 0959   07/08/12 1800   azithromycin (ZITHROMAX) tablet 500 mg  Status:  Discontinued        500 mg Oral Daily 07/08/12 1754 07/08/12 2006   07/08/12 1515   cefTRIAXone (ROCEPHIN) 1 g in dextrose 5 % 50 mL IVPB        1 g 100 mL/hr over 30 Minutes Intravenous  Once 07/08/12 1507 07/08/12 1623   07/08/12 1515   azithromycin (ZITHROMAX) 500 mg in dextrose 5 % 250 mL IVPB        500 mg 250 mL/hr over 60 Minutes Intravenous  Once 07/08/12 1507 07/08/12 1811           Jeoffrey Massed, MD  [facility] Pager [phone]  If 7PM-7AM, please contact night-coverage [email or web address] Password [email or web address] 07/10/2012, 12:52 PM   LOS: 2 days

## 2012-07-10 NOTE — Progress Notes (Signed)
Speech Language Pathology Dysphagia Treatment Patient Details Name: Katherine Cooper MRN: [id number] DOB: November 11, 1941 Today's Date: 07/10/2012 Time: 4098-1191 SLP Time Calculation (min): 15 min  Assessment / Plan / Recommendation Clinical Impression  Patient continues to be without overt s/s of aspiration or indication of an oropharyngeal dysphagia based on bedside skilled SLP observation. Patient with independent use of general reflux precautions, requiring only re-education (for daughter as well) regarding benefits of sitting at 90 degrees to aid in esophageal clearacen given c/o globus and slow esophageal transit suggestive of esophageal delays. No further skilled SLP f/u indicated at this time however MD may wish to consider esophageal w/u vs medication management of GERD like symptoms as regurgitation may be contributing to recurrent PNAs. Signing off. Please reconsult as needed.     Diet Recommendation  Continue with Current Diet: Regular;Thin liquid    SLP Plan All goals met   Pertinent Vitals/Pain n/a   Swallowing Goals  SLP Swallowing Goals Patient will consume recommended diet without observed clinical signs of aspiration with: Independent assistance Swallow Study Goal #1 - Progress: Met Patient will utilize recommended strategies during swallow to increase swallowing safety with: Independent assistance Swallow Study Goal #2 - Progress: Met  General Temperature Spikes Noted: No Respiratory Status: Room air Behavior/Cognition: Alert;Cooperative;Pleasant mood Oral Cavity - Dentition: Dentures, top Patient Positioning: Upright in bed     Dysphagia Treatment Treatment focused on: Skilled observation of diet tolerance;Patient/family/caregiver education;Utilization of compensatory strategies Family/Caregiver Educated: daughter Treatment Methods/Modalities: Skilled observation Patient observed directly with PO's: Yes Type of PO's observed: Regular;Thin liquids Feeding: Able to feed  self Liquids provided via: Cup   [employer] MA, CCC-SLP [phone]  Ferdinand Lango [name] 07/10/2012, 8:55 AM

## 2012-07-10 NOTE — Progress Notes (Signed)
Physical Therapy Treatment Patient Details Name: Katherine Cooper MRN: [id number] DOB: 11/15/1941 Today's Date: 07/10/2012 Time: 4696-2952 PT Time Calculation (min): 16 min  PT Assessment / Plan / Recommendation Comments on Treatment Session  Pt progressing well, still moving slowly on account of pain in chest but continues to progress with distance and indpendence.  Expect her to meet PT goals shortly.    Follow Up Recommendations  No PT follow up    Barriers to Discharge        Equipment Recommendations  None recommended by PT    Recommendations for Other Services    Frequency Min 2X/week   Plan Discharge plan remains appropriate;Frequency remains appropriate    Precautions / Restrictions Precautions Precautions: Fall Restrictions Weight Bearing Restrictions: No   Pertinent Vitals/Pain Discomfort in chest remains, meds requested    Mobility  Bed Mobility Bed Mobility: Not assessed Transfers Transfers: Sit to Stand;Stand to Sit Sit to Stand: From chair/3-in-1;6: Modified independent (Device/Increase time) Stand to Sit: 6: Modified independent (Device/Increase time);To chair/3-in-1 Details for Transfer Assistance: pt performing toilet transfers from 3-in-1 without physical assist.  Pt remembering proper hand placement with RW. Ambulation/Gait Ambulation/Gait Assistance: 6: Modified independent (Device/Increase time) Ambulation Distance (Feet): 250 Feet Assistive device: Rolling walker Ambulation/Gait Assistance Details: cues for upright posture. Pt has difficulty with changing directions in a timely manner.  She is able to ambulate fwd/ bkwd/ and sideways with RW safely but with decreased speed. Gait Pattern: Step-through pattern;Trunk flexed Gait velocity: decreased Stairs: No Wheelchair Mobility Wheelchair Mobility: No    Exercises     PT Diagnosis:    PT Problem List:   PT Treatment Interventions:     PT Goals Acute Rehab PT Goals PT Goal Formulation: With  patient/family Time For Goal Achievement: 07/16/12 Potential to Achieve Goals: Good Pt will go Sit to Stand: with modified independence PT Goal: Sit to Stand - Progress: Met Pt will go Stand to Sit: with modified independence PT Goal: Stand to Sit - Progress: Met Pt will Ambulate: >150 feet;with modified independence;with least restrictive assistive device;with gait velocity >(comment) ft/second (>1.9 ft/sec) PT Goal: Ambulate - Progress: Updated due to goal met  Visit Information  Last PT Received On: 07/10/12 Assistance Needed: +1    Subjective Data  Subjective: I am still sore in my chest Patient Stated Goal: feel better in my chest before I leave   Cognition  Overall Cognitive Status: Appears within functional limits for tasks assessed/performed Arousal/Alertness: Awake/alert Orientation Level: Appears intact for tasks assessed Behavior During Session: [facility] for tasks performed    Balance  Balance Balance Assessed: Yes Dynamic Standing Balance Dynamic Standing - Balance Support: Right upper extremity supported;During functional activity Dynamic Standing - Level of Assistance: 6: Modified independent (Device/Increase time)  End of Session PT - End of Session Equipment Utilized During Treatment: Gait belt Activity Tolerance: Patient tolerated treatment well Patient left: in chair;with call bell/phone within reach Nurse Communication: Mobility status   GP   Lyanne Co, PT  Acute Rehab Services  [phone]   Lyanne Co 07/10/2012, 3:26 PM

## 2012-07-11 MED ORDER — PREDNISONE 10 MG PO TABS: ORAL_TABLET | ORAL | Status: DC

## 2012-07-11 MED ORDER — MOXIFLOXACIN HCL 400 MG PO TABS: 400.0000 mg | ORAL_TABLET | Freq: Every day | ORAL | Status: DC

## 2012-07-11 MED ORDER — PNEUMOCOCCAL VAC POLYVALENT 25 MCG/0.5ML IJ INJ
0.5000 mL | INJECTION | Freq: Once | INTRAMUSCULAR | Status: AC
  Administered 2012-07-11: 0.5 mL via INTRAMUSCULAR
  Filled 2012-07-11 (×2): qty 0.5

## 2012-07-11 NOTE — Progress Notes (Signed)
Physical Therapy Treatment Patient Details Name: Katherine Cooper MRN: [id number] DOB: 28-Apr-1941 Today's Date: 07/11/2012 Time: 8295-6213 PT Time Calculation (min): 17 min  PT Assessment / Plan / Recommendation Comments on Treatment Session  Pt with PNA who is moving well and has nearly met goals other than limited gait speed. Anticipate by next session goals will be met.    Follow Up Recommendations       Barriers to Discharge        Equipment Recommendations       Recommendations for Other Services    Frequency     Plan Discharge plan remains appropriate    Precautions / Restrictions Precautions Precautions: Fall Restrictions Weight Bearing Restrictions: No   Pertinent Vitals/Pain "Soreness in neck and tailbone"    Mobility  Bed Mobility Bed Mobility: Not assessed Transfers Transfers: Sit to Stand;Stand to Sit Sit to Stand: 6: Modified independent (Device/Increase time);From chair/3-in-1;From toilet Stand to Sit: 6: Modified independent (Device/Increase time);To toilet;To chair/3-in-1 Ambulation/Gait Ambulation/Gait Assistance: 6: Modified independent (Device/Increase time) Ambulation Distance (Feet): 300 Feet Assistive device: Rolling walker Ambulation/Gait Assistance Details: cues x 1 to step into RW Gait velocity: 78ft/20 sec=1.59ft/sec Stairs: Yes Stairs Assistance: 6: Modified independent (Device/Increase time) Stair Management Technique: Two rails Number of Stairs: 2     Exercises     PT Diagnosis:    PT Problem List:   PT Treatment Interventions:     PT Goals Acute Rehab PT Goals PT Goal: Ambulate - Progress: Progressing toward goal  Visit Information  Last PT Received On: 07/11/12 Assistance Needed: +1    Subjective Data  Subjective: I guess i'm ok to go home   Cognition  Overall Cognitive Status: Appears within functional limits for tasks assessed/performed Arousal/Alertness: Awake/alert Orientation Level: Appears intact for tasks  assessed Behavior During Session: [facility] for tasks performed    Balance     End of Session PT - End of Session Activity Tolerance: Patient tolerated treatment well Patient left: in chair;with call bell/phone within reach   GP     Delorse Lek 07/11/2012, 10:36 AM Delaney Meigs, PT [phone]

## 2012-07-11 NOTE — Discharge Summary (Signed)
PATIENT DETAILS Name: Katherine Cooper Age: 71 y.o. Sex: female Date of Birth: 1941/09/23 MRN: [id number]. Admit Date: 07/08/2012 Admitting Physician: Rhetta Mura, MD WUJ:[id number],WGNFAOZH C, MD  Recommendations for Outpatient Follow-up:  1. Follow up with PCP in 1 week. 2. Needs follow up CXR in 6-8 weeks to document resolution of the infiltrate 3. Stopped Losartan for hyperkalemia  PRIMARY DISCHARGE DIAGNOSIS:  Active Problems:  PNA (pneumonia)  Hyperkalemia  HTN (hypertension)  Acute on chronic kidney failure  Elbow pain Severe Osteo Arthritis     PAST MEDICAL HISTORY: Past Medical History  Diagnosis Date  . Arthritis   . Hypertension   . Gout   . Obesity   . Angina   . Heart murmur   . Chronic kidney disease   . Stroke   . Recurrent upper respiratory infection (URI)   . Hypothyroidism   . Blood transfusion   . Headache   . Anxiety   . Dysrhythmia   . Anemia   . CAP (community acquired pneumonia)     DISCHARGE MEDICATIONS: Medication List  As of 07/11/2012 12:27 PM   STOP taking these medications         valsartan 320 MG tablet         TAKE these medications         albuterol 108 (90 BASE) MCG/ACT inhaler   Commonly known as: PROVENTIL HFA;VENTOLIN HFA   Inhale 2 puffs into the lungs every 6 (six) hours as needed. For shortness of breath      allopurinol 300 MG tablet   Commonly known as: ZYLOPRIM   Take 300 mg by mouth daily.      carvedilol 3.125 MG tablet   Commonly known as: COREG   Take 3.125 mg by mouth 2 (two) times daily with a meal.      clorazepate 7.5 MG tablet   Commonly known as: TRANXENE   Take 7.5 mg by mouth 3 (three) times daily.      donepezil 10 MG tablet   Commonly known as: ARICEPT   Take 10 mg by mouth at bedtime.      furosemide 40 MG tablet   Commonly known as: LASIX   Take 40 mg by mouth 2 (two) times daily.      HYDROcodone-acetaminophen 5-500 MG per tablet   Commonly known as: VICODIN   Take 1 tablet by mouth  2 (two) times daily as needed. For pain      loratadine 10 MG tablet   Commonly known as: CLARITIN   Take 10 mg by mouth daily.      memantine 10 MG tablet   Commonly known as: NAMENDA   Take 10 mg by mouth 2 (two) times daily.      moxifloxacin 400 MG tablet   Commonly known as: AVELOX   Take 1 tablet (400 mg total) by mouth daily.      multivitamin with minerals Tabs   Take 1 tablet by mouth daily.      omeprazole 20 MG capsule   Commonly known as: PRILOSEC   Take 20 mg by mouth daily with breakfast.      oxybutynin 5 MG tablet   Commonly known as: DITROPAN   Take 5 mg by mouth 3 (three) times daily.      potassium chloride SA 20 MEQ tablet   Commonly known as: K-DUR,KLOR-CON   Take 10 mEq by mouth daily.      predniSONE 10 MG tablet   Commonly known as:  DELTASONE   Take 4 tablets daily for 2 days, then  Take 3 tablets daily for 2 days, then  Take 2 tablets daily for 2 days, then  Take 1 tablet daily for 1 day and then stop      simvastatin 40 MG tablet   Commonly known as: ZOCOR   Take 40 mg by mouth at bedtime.      sucralfate 1 G tablet   Commonly known as: CARAFATE   Take 1 g by mouth 2 (two) times daily.             BRIEF HPI:  See H&P, Labs, Consult and Test reports for all details in brief, patient was admitted for generalized pain and mild Shortness of breath. She was found to have a PNA on CXR and acute gouty arthritis of her Left Elbow area.   CONSULTATIONS:   None  PERTINENT RADIOLOGIC STUDIES: Dg Chest 2 View  07/08/2012  *RADIOLOGY REPORT*  Clinical Data: Generalized weakness, chest pain, shortness of breath and body aches.  CHEST - 2 VIEW  Comparison: 11/12/2011.  Findings: Trachea is midline.  Heart is mildly enlarged.  Mild pleural parenchymal scarring and volume loss at the base of the left hemithorax. Possible air space opacification in the right suprahilar region.  Difficult to exclude a tiny right pleural effusion.  IMPRESSION:  1.   Possible air space opacification in the right suprahilar region versus rotation and prominent vascularity.  Follow-up PA and lateral views of the chest in 4-6 weeks recommended. 2.  Possible tiny right pleural effusion.  Original Report Authenticated By: Reyes Ivan, M.D.     PERTINENT LAB RESULTS: CBC:  Basename 07/09/12 0631 07/08/12 1942  WBC 8.6 9.0  HGB 9.7* 11.0*  HCT 31.9* 35.3*  PLT 222 235   CMET CMP     Component Value Date/Time   NA 141 07/09/2012 0631   K 4.7 07/09/2012 0631   CL 107 07/09/2012 0631   CO2 24 07/09/2012 0631   GLUCOSE 110* 07/09/2012 0631   BUN 38* 07/09/2012 0631   CREATININE 1.89* 07/09/2012 0631   CALCIUM 9.1 07/09/2012 0631   CALCIUM 10.5 07/06/2007 1020   PROT 5.9* 07/09/2012 0631   ALBUMIN 2.8* 07/09/2012 0631   AST 11 07/09/2012 0631   ALT 16 07/09/2012 0631   ALKPHOS 79 07/09/2012 0631   BILITOT 0.1* 07/09/2012 0631   GFRNONAA 26* 07/09/2012 0631   GFRAA 30* 07/09/2012 0631    GFR Estimated Creatinine Clearance: 33.4 ml/min (by C-G formula based on Cr of 1.89).  Basename 07/08/12 1322  LIPASE 18  AMYLASE --   No results found for this basename: CKTOTAL:3,CKMB:3,CKMBINDEX:3,TROPONINI:3 in the last 72 hours No components found with this basename: POCBNP:3 No results found for this basename: DDIMER:2 in the last 72 hours No results found for this basename: HGBA1C:2 in the last 72 hours No results found for this basename: CHOL:2,HDL:2,LDLCALC:2,TRIG:2,CHOLHDL:2,LDLDIRECT:2 in the last 72 hours No results found for this basename: TSH,T4TOTAL,FREET3,T3FREE,THYROIDAB in the last 72 hours No results found for this basename: VITAMINB12:2,FOLATE:2,FERRITIN:2,TIBC:2,IRON:2,RETICCTPCT:2 in the last 72 hours Coags: No results found for this basename: PT:2,INR:2 in the last 72 hours Microbiology: Recent Results (from the past 240 hour(s))  CULTURE, BLOOD (ROUTINE X 2)     Status: Normal (Preliminary result)   Collection Time   07/08/12  7:20 PM       Component Value Range Status Comment   Specimen Description BLOOD ARM LEFT   Final    Special Requests  BOTTLES DRAWN AEROBIC AND ANAEROBIC 10CC   Final    Culture  Setup Time 07/09/2012 02:48   Final    Culture     Final    Value:        BLOOD CULTURE RECEIVED NO GROWTH TO DATE CULTURE WILL BE HELD FOR 5 DAYS BEFORE ISSUING A FINAL NEGATIVE REPORT   Report Status PENDING   Incomplete   CULTURE, BLOOD (ROUTINE X 2)     Status: Normal (Preliminary result)   Collection Time   07/08/12  7:35 PM      Component Value Range Status Comment   Specimen Description BLOOD ARM LEFT   Final    Special Requests BOTTLES DRAWN AEROBIC AND ANAEROBIC 10CC   Final    Culture  Setup Time 07/09/2012 02:48   Final    Culture     Final    Value:        BLOOD CULTURE RECEIVED NO GROWTH TO DATE CULTURE WILL BE HELD FOR 5 DAYS BEFORE ISSUING A FINAL NEGATIVE REPORT   Report Status PENDING   Incomplete      BRIEF HOSPITAL COURSE:   Active Problems:  PNA (pneumonia) -patient was admitted, on admission-she did complain of mild shortness of breath, she was then placed on Rocephin and Zithromax. Se was not febrile, nor did she have any leukocytosis. With these measures she felt much better, this am, she requested she be discharged home. She will now be transitioned to Avelox on discharge for 4 more days, to complete a 7 day course   Hyperkalemia -this was 2/2 t acute on chronic kidney disease and losartan. -this has resolved, losartan has been discontinued   HTN (hypertension) -stable, continue with her previous meds, with the exception of Losartan   Acute on chronic kidney failure -back to baseline, acute component likely pre-renal, resolved after IVF   Elbow pain-left -likley gouty flare-much better with steroid burst -taper prednisone on discharge  Chronic pain -predominantly back pain -on chronic vicodin at home -currently on discharge having mild neck and sacral pain   TODAY-DAY OF  DISCHARGE:  Subjective:   Katherine Cooper today has no headache,no chest abdominal pain,no new weakness tingling or numbness, feels much better wants to go home today.   Objective:   Blood pressure 115/78, pulse 73, temperature 98.7 F (37.1 C), temperature source Oral, resp. rate 20, height 5\' 6"  (1.676 m), weight 104.6 kg (230 lb 9.6 oz), SpO2 94.00%.  Intake/Output Summary (Last 24 hours) at 07/11/12 1227 Last data filed at 07/11/12 0900  Gross per 24 hour  Intake 474.67 ml  Output   1600 ml  Net -1125.33 ml    Exam Awake Alert, Oriented *3, No new F.N deficits, Normal affect [place].AT,PERRAL Supple Neck,No JVD, No cervical lymphadenopathy appriciated.  Symmetrical Chest wall movement, Good air movement bilaterally, CTAB RRR,No Gallops,Rubs or new Murmurs, No Parasternal Heave +ve B.Sounds, Abd Soft, Non tender, No organomegaly appriciated, No rebound -guarding or rigidity. No Cyanosis, Clubbing or edema, No new Rash or bruise  DISCHARGE CONDITION: Stable  DISPOSITION: HOME  DISCHARGE INSTRUCTIONS:    Activity:  As tolerated with Full fall precautions use walker/cane & assistance as needed  Diet recommendation: Heart Healthy diet  Follow-up Information    Follow up with Ron Parker, MD. Schedule an appointment as soon as possible for a visit in 1 week.   Contact information:   [address] [place] [address] [phone]  Total Time spent on discharge equals 45 minutes.  SignedJeoffrey Massed 07/11/2012 12:27 PM

## 2012-07-11 NOTE — Progress Notes (Signed)
Patient discharge teaching given, including activity, diet, follow-up appoints, and medications. Patient verbalized understanding of all discharge instructions. IV access was d/c'd. Vitals are stable. Skin is intact except as charted in most recent assessments. Pt to be escorted out by NT, to be driven home by family. Pt given Pna vaccination before discharge.

## 2012-07-15 LAB — CULTURE, BLOOD (ROUTINE X 2)

## 2012-07-21 ENCOUNTER — Inpatient Hospital Stay (HOSPITAL_COMMUNITY)
Admission: EM | Admit: 2012-07-21 | Discharge: 2012-07-26 | DRG: 315 | Disposition: A | Discharge status: Home / Self Care | Payer: PRIVATE HEALTH INSURANCE | Attending: Internal Medicine | Admitting: Internal Medicine

## 2012-07-21 ENCOUNTER — Observation Stay (HOSPITAL_COMMUNITY): Payer: PRIVATE HEALTH INSURANCE

## 2012-07-21 ENCOUNTER — Emergency Department (HOSPITAL_COMMUNITY): Payer: PRIVATE HEALTH INSURANCE

## 2012-07-21 ENCOUNTER — Encounter (HOSPITAL_COMMUNITY): Payer: Self-pay

## 2012-07-21 DIAGNOSIS — E039 Hypothyroidism, unspecified: Secondary | ICD-10-CM | POA: Diagnosis present

## 2012-07-21 DIAGNOSIS — I959 Hypotension, unspecified: Principal | ICD-10-CM | POA: Diagnosis present

## 2012-07-21 DIAGNOSIS — E875 Hyperkalemia: Secondary | ICD-10-CM

## 2012-07-21 DIAGNOSIS — N179 Acute kidney failure, unspecified: Secondary | ICD-10-CM

## 2012-07-21 DIAGNOSIS — J189 Pneumonia, unspecified organism: Secondary | ICD-10-CM

## 2012-07-21 DIAGNOSIS — F419 Anxiety disorder, unspecified: Secondary | ICD-10-CM | POA: Diagnosis present

## 2012-07-21 DIAGNOSIS — M129 Arthropathy, unspecified: Secondary | ICD-10-CM | POA: Diagnosis present

## 2012-07-21 DIAGNOSIS — N184 Chronic kidney disease, stage 4 (severe): Secondary | ICD-10-CM | POA: Diagnosis present

## 2012-07-21 DIAGNOSIS — G8929 Other chronic pain: Secondary | ICD-10-CM

## 2012-07-21 DIAGNOSIS — D631 Anemia in chronic kidney disease: Secondary | ICD-10-CM | POA: Diagnosis present

## 2012-07-21 DIAGNOSIS — E860 Dehydration: Secondary | ICD-10-CM | POA: Diagnosis present

## 2012-07-21 DIAGNOSIS — F0390 Unspecified dementia without behavioral disturbance: Secondary | ICD-10-CM | POA: Diagnosis present

## 2012-07-21 DIAGNOSIS — M25529 Pain in unspecified elbow: Secondary | ICD-10-CM

## 2012-07-21 DIAGNOSIS — M25559 Pain in unspecified hip: Secondary | ICD-10-CM | POA: Diagnosis present

## 2012-07-21 DIAGNOSIS — Z8673 Personal history of transient ischemic attack (TIA), and cerebral infarction without residual deficits: Secondary | ICD-10-CM

## 2012-07-21 DIAGNOSIS — Z79899 Other long term (current) drug therapy: Secondary | ICD-10-CM

## 2012-07-21 DIAGNOSIS — M109 Gout, unspecified: Secondary | ICD-10-CM | POA: Diagnosis present

## 2012-07-21 DIAGNOSIS — D649 Anemia, unspecified: Secondary | ICD-10-CM

## 2012-07-21 DIAGNOSIS — R079 Chest pain, unspecified: Secondary | ICD-10-CM

## 2012-07-21 DIAGNOSIS — E669 Obesity, unspecified: Secondary | ICD-10-CM | POA: Diagnosis present

## 2012-07-21 DIAGNOSIS — R600 Localized edema: Secondary | ICD-10-CM

## 2012-07-21 DIAGNOSIS — R5381 Other malaise: Secondary | ICD-10-CM | POA: Diagnosis present

## 2012-07-21 DIAGNOSIS — Z87891 Personal history of nicotine dependence: Secondary | ICD-10-CM

## 2012-07-21 DIAGNOSIS — I129 Hypertensive chronic kidney disease with stage 1 through stage 4 chronic kidney disease, or unspecified chronic kidney disease: Secondary | ICD-10-CM | POA: Diagnosis present

## 2012-07-21 DIAGNOSIS — F411 Generalized anxiety disorder: Secondary | ICD-10-CM

## 2012-07-21 DIAGNOSIS — I10 Essential (primary) hypertension: Secondary | ICD-10-CM

## 2012-07-21 LAB — BASIC METABOLIC PANEL
BUN: 53 mg/dL — ABNORMAL HIGH (ref 6–23)
CO2: 24 mEq/L (ref 19–32)
Chloride: 99 mEq/L (ref 96–112)
Creatinine, Ser: 2.44 mg/dL — ABNORMAL HIGH (ref 0.50–1.10)
GFR calc Af Amer: 22 mL/min — ABNORMAL LOW (ref >90)
Glucose, Bld: 73 mg/dL (ref 70–99)

## 2012-07-21 LAB — URINE MICROSCOPIC-ADD ON

## 2012-07-21 LAB — CBC
HCT: 38.3 % (ref 36.0–46.0)
Hemoglobin: 11.8 g/dL — ABNORMAL LOW (ref 12.0–15.0)
MCV: 88.5 fL (ref 78.0–100.0)
RDW: 15.6 % — ABNORMAL HIGH (ref 11.5–15.5)
WBC: 7.7 10*3/uL (ref 4.0–10.5)

## 2012-07-21 LAB — URINALYSIS, ROUTINE W REFLEX MICROSCOPIC
Bilirubin Urine: NEGATIVE
Glucose, UA: NEGATIVE mg/dL
Hgb urine dipstick: NEGATIVE
Ketones, ur: NEGATIVE mg/dL
pH: 5.5 (ref 5.0–8.0)

## 2012-07-21 MED ORDER — DONEPEZIL HCL 10 MG PO TABS
10.0000 mg | ORAL_TABLET | Freq: Every day | ORAL | Status: DC
  Administered 2012-07-22 – 2012-07-25 (×5): 10 mg via ORAL
  Filled 2012-07-21 (×6): qty 1

## 2012-07-21 MED ORDER — HYDROCODONE-ACETAMINOPHEN 5-325 MG PO TABS
1.0000 | ORAL_TABLET | Freq: Four times a day (QID) | ORAL | Status: DC | PRN
  Administered 2012-07-22 – 2012-07-26 (×7): 1 via ORAL
  Filled 2012-07-21 (×8): qty 1

## 2012-07-21 MED ORDER — OXYBUTYNIN CHLORIDE 5 MG PO TABS
5.0000 mg | ORAL_TABLET | Freq: Three times a day (TID) | ORAL | Status: DC
  Administered 2012-07-22 – 2012-07-26 (×14): 5 mg via ORAL
  Filled 2012-07-21 (×16): qty 1

## 2012-07-21 MED ORDER — ADULT MULTIVITAMIN W/MINERALS CH
1.0000 | ORAL_TABLET | Freq: Every day | ORAL | Status: DC
  Administered 2012-07-22 – 2012-07-26 (×5): 1 via ORAL
  Filled 2012-07-21 (×5): qty 1

## 2012-07-21 MED ORDER — ALLOPURINOL 300 MG PO TABS
300.0000 mg | ORAL_TABLET | Freq: Every day | ORAL | Status: DC
  Administered 2012-07-22 – 2012-07-26 (×5): 300 mg via ORAL
  Filled 2012-07-21 (×5): qty 1

## 2012-07-21 MED ORDER — CLORAZEPATE DIPOTASSIUM 7.5 MG PO TABS
7.5000 mg | ORAL_TABLET | Freq: Three times a day (TID) | ORAL | Status: DC
  Administered 2012-07-22 – 2012-07-26 (×13): 7.5 mg via ORAL
  Filled 2012-07-21: qty 1
  Filled 2012-07-21 (×3): qty 2
  Filled 2012-07-21: qty 1
  Filled 2012-07-21 (×9): qty 2

## 2012-07-21 MED ORDER — MEMANTINE HCL 10 MG PO TABS
10.0000 mg | ORAL_TABLET | Freq: Two times a day (BID) | ORAL | Status: DC
  Administered 2012-07-22 – 2012-07-26 (×9): 10 mg via ORAL
  Filled 2012-07-21 (×11): qty 1

## 2012-07-21 MED ORDER — SODIUM CHLORIDE 0.9 % IV BOLUS (SEPSIS)
500.0000 mL | Freq: Once | INTRAVENOUS | Status: AC
  Administered 2012-07-21: 500 mL via INTRAVENOUS

## 2012-07-21 MED ORDER — SIMVASTATIN 40 MG PO TABS
40.0000 mg | ORAL_TABLET | Freq: Every day | ORAL | Status: DC
  Administered 2012-07-22 – 2012-07-25 (×5): 40 mg via ORAL
  Filled 2012-07-21 (×7): qty 1

## 2012-07-21 MED ORDER — SODIUM CHLORIDE 0.9 % IV BOLUS (SEPSIS)
1000.0000 mL | Freq: Once | INTRAVENOUS | Status: AC
  Administered 2012-07-21: 1000 mL via INTRAVENOUS

## 2012-07-21 MED ORDER — OXYCODONE-ACETAMINOPHEN 5-325 MG PO TABS
1.0000 | ORAL_TABLET | Freq: Once | ORAL | Status: AC
  Administered 2012-07-21: 1 via ORAL
  Filled 2012-07-21: qty 1

## 2012-07-21 MED ORDER — SODIUM CHLORIDE 0.9 % IV SOLN
INTRAVENOUS | Status: AC
  Administered 2012-07-22: 14:00:00 via INTRAVENOUS

## 2012-07-21 MED ORDER — LORATADINE 10 MG PO TABS
10.0000 mg | ORAL_TABLET | Freq: Every day | ORAL | Status: DC
  Administered 2012-07-22 – 2012-07-26 (×5): 10 mg via ORAL
  Filled 2012-07-21 (×5): qty 1

## 2012-07-21 MED ORDER — PANTOPRAZOLE SODIUM 40 MG PO TBEC
40.0000 mg | DELAYED_RELEASE_TABLET | Freq: Every day | ORAL | Status: DC
  Administered 2012-07-22 – 2012-07-26 (×5): 40 mg via ORAL
  Filled 2012-07-21 (×5): qty 1

## 2012-07-21 MED ORDER — SODIUM CHLORIDE 0.9 % IV SOLN
INTRAVENOUS | Status: AC
  Administered 2012-07-22: 1000 mL via INTRAVENOUS

## 2012-07-21 MED ORDER — SUCRALFATE 1 G PO TABS
1.0000 g | ORAL_TABLET | Freq: Two times a day (BID) | ORAL | Status: DC
  Administered 2012-07-22 – 2012-07-26 (×9): 1 g via ORAL
  Filled 2012-07-21 (×12): qty 1

## 2012-07-21 NOTE — H&P (Signed)
PCP:   Ron Parker, MD   Chief Complaint:  Hip pain  HPI: 71 yo female h/o gout, recent d/c from hosp tx for pna , htn, ckd comes to ED for chronic left hip pain.  She just had a gout flare and has been taking colchicine also.  Incidentally was found to have multiple low bp in the 80s.  Pt is asymptomatic has ambulated in the ED without difficulty.  Denies cp, sob, fevers.  Says she has recovered well from her recent pna.  No cough.  No le edema.  She says she has been urinating a whole lot over the last severaal days which is not normal for her despite being on diuretics for a long time.  Feels good except for the hip pain which is chronic and due to arthritis likely, no recent falls or trauma.  No syncopal episodes.  No dysuria.    Review of Systems:  O/w neg  Past Medical History: Past Medical History  Diagnosis Date  . Arthritis   . Hypertension   . Gout   . Obesity   . Angina   . Heart murmur   . Chronic kidney disease   . Stroke   . Recurrent upper respiratory infection (URI)   . Hypothyroidism   . Blood transfusion   . Headache   . Anxiety   . Dysrhythmia   . Anemia   . CAP (community acquired pneumonia)    Past Surgical History  Procedure Date  . Uterine fibroid surgery     Medications: Prior to Admission medications   Medication Sig Start Date End Date Taking? Authorizing Provider  albuterol (PROVENTIL HFA;VENTOLIN HFA) 108 (90 BASE) MCG/ACT inhaler Inhale 2 puffs into the lungs every 6 (six) hours as needed. For shortness of breath    Yes Historical Provider, MD  allopurinol (ZYLOPRIM) 300 MG tablet Take 300 mg by mouth daily.   Yes Historical Provider, MD  carvedilol (COREG) 3.125 MG tablet Take 3.125 mg by mouth 2 (two) times daily with a meal.     Yes Historical Provider, MD  clorazepate (TRANXENE) 7.5 MG tablet Take 7.5 mg by mouth 3 (three) times daily.   Yes Historical Provider, MD  donepezil (ARICEPT) 10 MG tablet Take 10 mg by mouth at bedtime.      Yes Historical Provider, MD  furosemide (LASIX) 40 MG tablet Take 40 mg by mouth 2 (two) times daily.     Yes Historical Provider, MD  HYDROcodone-acetaminophen (VICODIN) 5-500 MG per tablet Take 1 tablet by mouth 2 (two) times daily as needed. For pain   Yes Historical Provider, MD  loratadine (CLARITIN) 10 MG tablet Take 10 mg by mouth daily.     Yes Historical Provider, MD  memantine (NAMENDA) 10 MG tablet Take 10 mg by mouth 2 (two) times daily.     Yes Historical Provider, MD  Multiple Vitamin (MULTIVITAMIN WITH MINERALS) TABS Take 1 tablet by mouth daily.   Yes Historical Provider, MD  omeprazole (PRILOSEC) 20 MG capsule Take 20 mg by mouth daily with breakfast.   Yes Historical Provider, MD  oxybutynin (DITROPAN) 5 MG tablet Take 5 mg by mouth 3 (three) times daily.     Yes Historical Provider, MD  potassium chloride SA (K-DUR,KLOR-CON) 20 MEQ tablet Take 10 mEq by mouth daily.     Yes Historical Provider, MD  simvastatin (ZOCOR) 40 MG tablet Take 40 mg by mouth at bedtime.     Yes Historical Provider, MD  sucralfate (CARAFATE)  1 G tablet Take 1 g by mouth 2 (two) times daily.     Yes Historical Provider, MD    Allergies:   Allergies  Allergen Reactions  . Sulfa Antibiotics Itching    Social History:  reports that she quit smoking about 4 months ago. Her smoking use included Cigarettes. She smoked .25 packs per day. She does not have any smokeless tobacco history on file. She reports that she does not drink alcohol or use illicit drugs.  Family History: Family History  Problem Relation Age of Onset  . Asthma Mother   . Heart attack Father     Physical Exam: Filed Vitals:   07/21/12 1736 07/21/12 1737 07/21/12 1739 07/21/12 1857  BP: 83/57 91/51 82/57  86/61  Pulse: 68 80 83 69  Temp:      TempSrc:      Resp:    14  SpO2:    100%   General appearance: alert, cooperative and no distress Lungs: clear to auscultation bilaterally Heart: regular rate and rhythm, S1, S2  normal, no murmur, click, rub or gallop Abdomen: soft, non-tender; bowel sounds normal; no masses,  no organomegaly Extremities: extremities normal, atraumatic, no cyanosis or edema  Hips b from, 5/5 strength ble Pulses: 2+ and symmetric Skin: Skin color, texture, turgor normal. No rashes or lesions Neurologic: Grossly normal    Labs on Admission:   [facility] 07/21/12 1457  NA 138  K 4.3  CL 99  CO2 24  GLUCOSE 73  BUN 53*  CREATININE 2.44*  CALCIUM 9.4  MG --  PHOS --    Basename 07/21/12 1457  WBC 7.7  NEUTROABS --  HGB 11.8*  HCT 38.3  MCV 88.5  PLT 289   Radiological Exams on Admission: Dg Chest 2 View  07/21/2012  *RADIOLOGY REPORT*  Clinical Data: Hypotension.  CHEST - 2 VIEW  Comparison: 07/08/2012.  Findings: The heart is enlarged.  There is tortuosity and ectasia of the thoracic aorta.  The lungs are clear.  No pleural effusion. The bony thorax is intact.  IMPRESSION: Stable cardiac enlargement.  No acute pulmonary findings.  Original Report Authenticated By: P. Loralie Champagne, M.D.   Dg Chest 2 View  07/08/2012  *RADIOLOGY REPORT*  Clinical Data: Generalized weakness, chest pain, shortness of breath and body aches.  CHEST - 2 VIEW  Comparison: 11/12/2011.  Findings: Trachea is midline.  Heart is mildly enlarged.  Mild pleural parenchymal scarring and volume loss at the base of the left hemithorax. Possible air space opacification in the right suprahilar region.  Difficult to exclude a tiny right pleural effusion.  IMPRESSION:  1.  Possible air space opacification in the right suprahilar region versus rotation and prominent vascularity.  Follow-up PA and lateral views of the chest in 4-6 weeks recommended. 2.  Possible tiny right pleural effusion.  Original Report Authenticated By: Reyes Ivan, M.D.   Dg Hip Complete Left  07/21/2012  *RADIOLOGY REPORT*  Clinical Data: 71 year old female with recurrent left hip pain.  LEFT HIP - COMPLETE 2+ VIEW  Comparison:  04/21/2008  Findings: There is no evidence of fracture, subluxation or dislocation. The joint spaces are within normal limits. Degenerative changes of the lower lumbar spine are identified. No suspicious focal bony lesions are present.  IMPRESSION: No evidence of acute abnormality.  Degenerative changes of the lower lumbar spine.  Original Report Authenticated By: Rosendo Gros, M.D.    Assessment/Plan Present on Admission:  71 yo female with asymptomatic hypotension likely due  to mult bp meds/diuretics .Hypotension .Acute on chronic kidney failure .Hip pain .Obesity (BMI 30-39.9) .Anxiety  Will hold her bp meds.  Ivf.  obs in stepdown tonight.  No source of infection.  Monitor for any fever.  Wbc  Nml.  cxr neg.  Cr more elevated than normal, likely due to also recent use of colchicine.  F/u with ortho outpt for chronic hip issues.  Wilferd Ritson A [phone] 07/21/2012, 8:27 PM

## 2012-07-21 NOTE — ED Provider Notes (Signed)
Pt is moved to the CDU to await completion of testing after initial evaluation by EDP Powers. Her presenting complaint today was atraumatic hip pain, but she was found to be hypotensive throughout much of her ED stay. Additional workup was ordered to include labs, CXR, EKG. IV fluid was ordered. No orthostasis. Labs reviewed and demonstrate acute on chronic renal failure, no leukocytosis or electrolyte disturbance, no UTI. CXR reviewed and shows to infiltrate. [facility] hospitalist was paged to admit for hypotension and acute on chronic renal failure. I spoke initially with Dr Sunnie Nielsen, who refused admission until a lactic acid was drawn. This was performed and found to be normal; [facility] was repaged and I spoke with Dr Onalee Hua who accepted the patient and temporary admit orders/bed request were placed.  Shaaron Adler, [place] 07/21/12 2134

## 2012-07-21 NOTE — ED Provider Notes (Signed)
5:00 PM  Date: 07/21/2012  Rate: 63  Rhythm: normal sinus rhythm  QRS Axis: normal  Intervals: normal  ST/T Wave abnormalities: normal  Conduction Disutrbances:none  Narrative Interpretation: Normal EKG  Old EKG Reviewed: unchanged    Carleene Cooper III, MD 07/21/12 1701

## 2012-07-21 NOTE — ED Notes (Signed)
Patient transported to X-ray 

## 2012-07-21 NOTE — ED Notes (Addendum)
Per pt, she has chronic back/tailbone pain.  Pt feels that it is getting worse.  Pt has pain in the tailbone with increased pain on the left side.  Pt initially stated tailbone pain and now states she has lower back, hip, and tailbone pain on the left.

## 2012-07-21 NOTE — ED Provider Notes (Signed)
History     CSN: [id number]  Arrival date & time 07/21/12  1478   First MD Initiated Contact with Patient 07/21/12 [phone]      Chief Complaint  Patient presents with  . Tailbone Pain    (Consider location/radiation/quality/duration/timing/severity/associated sxs/prior treatment) HPI Comments: Katherine Cooper c/o atraumatic, recurrent left hip pain.  She states after being discharged from the hospital last week she returned home and was extremely active cleaning and organizing her home.  She reports a nagging aching in her left hip that is exacerbated by bearing weight and transferring weight while walking.  She has had similar pain in the past and feels that it may be secondary to arthritis but reports that the medication she is taking at home is not working.  She denies falls, fever, NVD, cough, SOB, dysuria, back pain, and reports that it feels like the pneumonia that necessitated hospital admission seems to have resolved.  The history is provided by the patient. No language interpreter was used.    Past Medical History  Diagnosis Date  . Arthritis   . Hypertension   . Gout   . Obesity   . Angina   . Heart murmur   . Chronic kidney disease   . Stroke   . Recurrent upper respiratory infection (URI)   . Hypothyroidism   . Blood transfusion   . Headache   . Anxiety   . Dysrhythmia   . Anemia   . CAP (community acquired pneumonia)     Past Surgical History  Procedure Date  . Uterine fibroid surgery     Family History  Problem Relation Age of Onset  . Asthma Mother   . Heart attack Father     History  Substance Use Topics  . Smoking status: Former Smoker -- 0.2 packs/day    Types: Cigarettes    Quit date: 03/13/2012  . Smokeless tobacco: Not on file  . Alcohol Use: No    OB History    Grav Para Term Preterm Abortions TAB SAB Ect Mult Living                  Review of Systems  Constitutional: Positive for activity change. Negative for fever, chills, diaphoresis,  appetite change, fatigue and unexpected weight change.  HENT: Negative.   Eyes: Negative.   Respiratory: Negative.   Cardiovascular: Negative.   Gastrointestinal: Negative.   Genitourinary: Negative.   Musculoskeletal: Positive for arthralgias and gait problem. Negative for joint swelling.  Skin: Negative.   Neurological: Negative.   Hematological: Negative.   Psychiatric/Behavioral: Positive for dysphoric mood.    Allergies  Sulfa antibiotics  Home Medications   Current Outpatient Rx  Name Route Sig Dispense Refill  . ALBUTEROL SULFATE HFA 108 (90 BASE) MCG/ACT IN AERS Inhalation Inhale 2 puffs into the lungs every 6 (six) hours as needed. For shortness of breath     . ALLOPURINOL 300 MG PO TABS Oral Take 300 mg by mouth daily.    Marland Kitchen CARVEDILOL 3.125 MG PO TABS Oral Take 3.125 mg by mouth 2 (two) times daily with a meal.      . CLORAZEPATE DIPOTASSIUM 7.5 MG PO TABS Oral Take 7.5 mg by mouth 3 (three) times daily.    . DONEPEZIL HCL 10 MG PO TABS Oral Take 10 mg by mouth at bedtime.      . FUROSEMIDE 40 MG PO TABS Oral Take 40 mg by mouth 2 (two) times daily.      Marland Kitchen HYDROCODONE-ACETAMINOPHEN  5-500 MG PO TABS Oral Take 1 tablet by mouth 2 (two) times daily as needed. For pain    . LORATADINE 10 MG PO TABS Oral Take 10 mg by mouth daily.      Marland Kitchen MEMANTINE HCL 10 MG PO TABS Oral Take 10 mg by mouth 2 (two) times daily.      . ADULT MULTIVITAMIN W/MINERALS CH Oral Take 1 tablet by mouth daily.    Marland Kitchen OMEPRAZOLE 20 MG PO CPDR Oral Take 20 mg by mouth daily with breakfast.    . OXYBUTYNIN CHLORIDE 5 MG PO TABS Oral Take 5 mg by mouth 3 (three) times daily.      Marland Kitchen POTASSIUM CHLORIDE CRYS ER 20 MEQ PO TBCR Oral Take 10 mEq by mouth daily.      Marland Kitchen SIMVASTATIN 40 MG PO TABS Oral Take 40 mg by mouth at bedtime.      . SUCRALFATE 1 G PO TABS Oral Take 1 g by mouth 2 (two) times daily.        BP 81/53  Pulse 78  Temp 98.5 F (36.9 C) (Oral)  Resp 16  SpO2 100%  Physical Exam    Constitutional: She is oriented to person, place, and time. She appears well-developed and well-nourished. No distress.  HENT:  Head: Normocephalic and atraumatic.  Right Ear: External ear normal.  Left Ear: External ear normal.  Nose: Nose normal.  Mouth/Throat: Oropharynx is clear and moist. No oropharyngeal exudate.  Eyes: Conjunctivae and EOM are normal. Pupils are equal, round, and reactive to light. Right eye exhibits no discharge. Left eye exhibits no discharge. No scleral icterus.  Neck: Normal range of motion. Neck supple. No JVD present. No tracheal deviation present. No thyromegaly present.  Cardiovascular: Normal rate, regular rhythm, normal heart sounds and intact distal pulses.  Exam reveals no gallop and no friction rub.   No murmur heard. Pulmonary/Chest: Effort normal and breath sounds normal. No stridor. No respiratory distress. She has no wheezes. She has no rales. She exhibits no tenderness.  Abdominal: Soft. Bowel sounds are normal. She exhibits no distension and no mass. There is no tenderness. There is no rebound and no guarding.       Obese, protuberant  Musculoskeletal: Normal range of motion. She exhibits edema. She exhibits no tenderness.       Pt has discomfort on flexion and external rotation of left hip, no obvious deformity.  She also has mild left SI joint tenderness.  Lymphadenopathy:    She has no cervical adenopathy.  Neurological: She is alert and oriented to person, place, and time. She has normal reflexes. She displays normal reflexes. No cranial nerve deficit. She exhibits normal muscle tone. Coordination normal.  Skin: She is not diaphoretic.  Psychiatric: Her behavior is normal. Judgment and thought content normal. Her mood appears not anxious. Her affect is not blunt and not labile. Her speech is slurred. Her speech is not rapid and/or pressured, not delayed and not tangential. Cognition and memory are normal. She exhibits a depressed mood. She is  communicative.    ED Course  Procedures (including critical care time)  Labs Reviewed - No data to display No results found.   No diagnosis found.    MDM  Pt stable, NAD.  C/o hip pain that is exacerbated by repetitive mvmnt and exertion.  This is not and acute issue.  Plan pain mgmnt, imaging, if no other acute findings, plan d/c home with f/u with ortho.  Pt stable, no  evidence of hip fx or severe asymmetric degenerative disease noted.  Plan prn pain medication and close outpt f/u related to hip pain.    Did note that hypotension was documented in while pt was out in triage and she has continued with low normal and low BP throughout her time here in the ER.  There has been no associated tachy or bradycardia and no mental status changes.  Pt demonstrated no clinical evidence of pneumonia or systemic toxicity while under my care.  Reviewed vital sign from recent ER eval which at the time were also low but associated with tachycardia and possible infiltrate on CXR.  Pt now reports that she has had some recent dysuria (discomfort while urinating + increased urinary frequency), will at this time broaden her evaluation to include basic screening labs and u/a.  Pt's evaluation was to be completed by PA as labs are pending at time of signover.  Anticipated admission for further evaluation and management because hypotension persisted when signover given.       Tobin Chad, MD 07/22/12 [phone]

## 2012-07-21 NOTE — ED Notes (Signed)
Pt tolerated ambulation well.

## 2012-07-21 NOTE — Progress Notes (Signed)
ED MSW NOTE:  MSW received request to meet with pt/family re: SNF/HHC/ALF.  MSW met with pt to address referral. Pt presents A/O x4 and easily engaged in conversation. Pt reports she resides alone and while she has CNA assistance QD via PCS contracted with local HHC pt feels she may need to live in a home. MSW further engaged pt in conversation on the difference between SNF/ALF/RH/GH. Pt was able to articulate what an ALF is and feels she ready to make her decision re: placement. Per MD, pt will be discharged home to self care with f/u via her PCP. MSW provided pt and her family a list of [facility] ALF/RH/GH and encouraged the family to call and tour the facilities explaining the process for admission. Pt and family voiced appreciation. Pt reports to feels safe returning home at discharge. Family will provide transportation. Family stated they feel pt's PCS services are not adequate and want pt to have an increase in services. MSW requested the family to call pt's DSS Case Worker on Monday 07/22/12 explaining this writer is not able to authorize or request additional services. Again, pt and family expressed appreciation for the information and resources.  MSW updated RN on above. No further MSW interventions identified. MSW signing off. Please refer if needs arise prior to discharge.  Dionne Milo MSW [facility] Emergency Dept. Weekend/Social Worker [phone]

## 2012-07-21 NOTE — ED Notes (Addendum)
Katherine Cooper, Son-------------------[phone]

## 2012-07-21 NOTE — ED Notes (Addendum)
Report called to Donnamae Jude, Charity fundraiser.  Pt will be transported to CDU 6.

## 2012-07-22 ENCOUNTER — Encounter (HOSPITAL_COMMUNITY): Payer: Self-pay | Admitting: *Deleted

## 2012-07-22 DIAGNOSIS — G8929 Other chronic pain: Secondary | ICD-10-CM

## 2012-07-22 DIAGNOSIS — E860 Dehydration: Secondary | ICD-10-CM | POA: Diagnosis present

## 2012-07-22 DIAGNOSIS — D649 Anemia, unspecified: Secondary | ICD-10-CM

## 2012-07-22 LAB — CBC
HCT: 31.2 % — ABNORMAL LOW (ref 36.0–46.0)
MCH: 28 pg (ref 26.0–34.0)
MCV: 88.1 fL (ref 78.0–100.0)
RBC: 3.54 MIL/uL — ABNORMAL LOW (ref 3.87–5.11)
WBC: 5.9 10*3/uL (ref 4.0–10.5)

## 2012-07-22 LAB — BASIC METABOLIC PANEL
CO2: 25 mEq/L (ref 19–32)
Calcium: 9 mg/dL (ref 8.4–10.5)
Chloride: 108 mEq/L (ref 96–112)
Creatinine, Ser: 1.79 mg/dL — ABNORMAL HIGH (ref 0.50–1.10)
Glucose, Bld: 82 mg/dL (ref 70–99)
Sodium: 142 mEq/L (ref 135–145)

## 2012-07-22 MED ORDER — BISACODYL 10 MG RE SUPP: 10.0000 mg | Freq: Every day | RECTAL | Status: DC | PRN

## 2012-07-22 MED ORDER — SODIUM CHLORIDE 0.9 % IV SOLN
INTRAVENOUS | Status: DC
  Administered 2012-07-22: 22:00:00 via INTRAVENOUS
  Administered 2012-07-23: 20 mL/h via INTRAVENOUS
  Administered 2012-07-23: 125 mL/h via INTRAVENOUS

## 2012-07-22 MED ORDER — POLYETHYLENE GLYCOL 3350 17 G PO PACK
17.0000 g | PACK | Freq: Every day | ORAL | Status: DC
  Administered 2012-07-22 – 2012-07-25 (×4): 17 g via ORAL
  Filled 2012-07-22 (×6): qty 1

## 2012-07-22 MED ORDER — DOCUSATE SODIUM 100 MG PO CAPS
100.0000 mg | ORAL_CAPSULE | Freq: Two times a day (BID) | ORAL | Status: DC
  Administered 2012-07-22 – 2012-07-26 (×9): 100 mg via ORAL
  Filled 2012-07-22 (×9): qty 1

## 2012-07-22 NOTE — Progress Notes (Addendum)
BP better today, appears asymptomatic. Pt to TX to 6N 23. VSS; called report.

## 2012-07-22 NOTE — Progress Notes (Signed)
Clinical Social Work Department BRIEF PSYCHOSOCIAL ASSESSMENT 07/22/2012  Patient:  Katherine Cooper, Katherine Cooper     Account Number:  [id number]     Admit date:  07/21/2012  Clinical Social Worker:  Dennison Bulla  Date/Time:  07/22/2012 03:30 PM  Referred by:  RN  Date Referred:  07/22/2012 Referred for  ALF Placement   Other Referral:   Interview type:  Patient Other interview type:   Dtr present    PSYCHOSOCIAL DATA Living Status:  ALONE Admitted from facility:   Level of care:   Primary support name:  Twanda Primary support relationship to patient:  CHILD, ADULT Degree of support available:   Strong    CURRENT CONCERNS Current Concerns  Post-Acute Placement   Other Concerns:    SOCIAL WORK ASSESSMENT / PLAN CSW received referral from RN reporting that children had questions regarding patient's dc. CSW reviewed chart which stated that ED CSW spoke with patient regarding ALF. CSW met with patient and dtr at bedside. Patient agreeable to dtr being involved in assessment.    CSW introduced myself and explained role. Patient reported that she had received list from ED CSW but had not decided if she wanted to go to ALF. Patient reports that she is independent at home and has a CNA that assist her. Patient reports that she does get lonely and that is why she was considering ALF. Patient reports that she has already toured some ALFs, received her TB shot, and PCP has completed paperwork. At this time, patient is just waiting to make her final decision of if she wants to move out of her apartment to go to ALF. CSW encouraged patient to contact CSW with any further questions. Dtr agreed to assist patient with plans and to help her tour ALFs if desired.    CSW is signing off but available if needed.   Assessment/plan status:   Other assessment/ plan:   Information/referral to community resources:   Patient has ALF list    PATIENT'S/FAMILY'S RESPONSE TO PLAN OF CARE: Patient was alert and  oriented. Patient is knowledgeable about ALF process but reports she will need to decide if she actually wants to move to ALF. Patient appreciative of CSW consult.

## 2012-07-22 NOTE — Progress Notes (Signed)
NP Craige Cotta was notified about the patient's blood pressure of 82/60. NP Craige Cotta gave a verbal ordered to continue the NS at 125 ml/hr and to monitor the patient's blood pressure.

## 2012-07-22 NOTE — Progress Notes (Signed)
[facility] Progress Note [place] TEAM 1 - Stepdown/ICU TEAM   Katherine Cooper:[id number] DOB: 1941-01-06 DOA: 07/21/2012 PCP: Ron Parker, MD  Brief narrative: 71 year old female patient with chronic pain syndrome related to gout and arthritis. She was recently discharged after an acute stay for pneumonia. She presented to the emergency department with acute on chronic left hip pain. She endorses recent gout flare and had been taking colchicine. In emergency department patient was found to have multiple blood pressure readings in the 80s. Patient was asymptomatic and was ambulating in emergency department for complaints of dizziness weakness or gait instability. She did endorse that she has been urinating quite a bit over the past several days which is not normal for her despite being on diuretics for quite some time. Her main complaint is regarding the hip pain. X-rays performed in the ER showed no acute process in the hip. She does not endorse any recent falls. Because of the hypotension she was admitted to the step down unit for further monitoring and evaluation.  Assessment/Plan:  Hypotension *Likely multifactorial secondary to volume depletion from ongoing use of diuretics as well as ongoing ingestion of her usual antihypertensive medications *Continue IV fluid hydration *Pressure remains soft but is consistently improving - patient did receive a steroid taper during that last admission but was discharged greater than 2 weeks prior so low index of suspicion for adrenal insufficiency - taper was over 7 days  Dehydration *As above-continue IV fluids *Reviewed discharge summary from previous hospitalization and no change was made in her diuretics at that time  CKD (chronic kidney disease) stage 4, GFR 15-29 ml/min *Creatinine stable but slightly higher than normal *Continue to follow electrolytes  Hip pain/Chronic pain *Continue usual medications *X-ray showed no acute  process and there is no leukocytosis so do not suspect any type of acute infectious process *May need MRI of the hip if symptoms continue *Once blood pressure is stable will need PT OT evaluation *Because of use of diuretics and multiple medications pain and other symptoms she is at high-risk for constipation therefore I will begin 3 times a day Colace and daily MiraLax and provide when necessary Dulcolax suppository for obstipation  Anemia *Hemoglobin has dropped 1 g after hydration  *Baseline hemoglobin is around 9.6 - followup in a.m.   Obesity (BMI 30-39.9)  Anxiety  DVT prophylaxis: SCDs hose  Code Status:  full Family Communication:  communicated current plans with patient who verbalized understanding  Disposition Plan:  transfer to medical floor   Consultants:  none   Procedures:  none   Antibiotics:  none   HPI/Subjective: Continues to endorse hip pain which she reports is chronic and appears to be in and exacerbated stage. Currently has no chest pain, shortness of breath or dizziness upon sitting up in the bed.    Objective: Blood pressure 106/70, pulse 87, temperature 98.9 F (37.2 C), temperature source Oral, resp. rate 18, height 5\' 5"  (1.651 m), weight 98.8 kg (217 lb 13 oz), SpO2 100.00%.  Intake/Output Summary (Last 24 hours) at 07/22/12 1309 Last data filed at 07/22/12 0300  Gross per 24 hour  Intake   1500 ml  Output    500 ml  Net   1000 ml     Exam: General: No acute respiratory distress - alert and conversant Lungs: Clear to auscultation bilaterally without wheezes or crackles Cardiovascular: Regular rate and rhythm without murmur gallop or rub normal S1 and S2 Abdomen: Nontender, nondistended, soft, bowel sounds  positive, no rebound, no ascites, no appreciable mass Extremities: No significant cyanosis, clubbing, or edema bilateral lower extremities  Data Reviewed: Basic Metabolic Panel:  Lab 07/22/12 1610 07/21/12 1457  NA 142 138  K 3.9  4.3  CL 108 99  CO2 25 24  GLUCOSE 82 73  BUN 35* 53*  CREATININE 1.79* 2.44*  CALCIUM 9.0 9.4  MG -- --  PHOS -- --   CBC:  Lab 07/22/12 0530 07/21/12 1457  WBC 5.9 7.7  NEUTROABS -- --  HGB 9.9* 11.8*  HCT 31.2* 38.3  MCV 88.1 88.5  PLT 215 289    Recent Results (from the past 240 hour(s))  MRSA PCR SCREENING     Status: Normal   Collection Time   07/21/12 11:48 PM      Component Value Range Status Comment   MRSA by PCR NEGATIVE  NEGATIVE Final      Studies:  Recent x-ray studies have been reviewed in detail by the Attending Physician  Scheduled Meds:  Reviewed in detail by the Attending Physician   Junious Silk, ANP [facility] Office  [phone] Pager [phone]  On-Call/Text Page:      [email or web address]      password [email or web address]  If 7PM-7AM, please contact night-coverage [email or web address] Password [email or web address] 07/22/2012, 1:09 PM   LOS: 1 day   I have personally examined this patient and reviewed the entire database. I have reviewed the above note, made any necessary editorial changes, and agree with its content.  Lonia Blood, MD [facility]

## 2012-07-23 ENCOUNTER — Inpatient Hospital Stay (HOSPITAL_COMMUNITY): Payer: PRIVATE HEALTH INSURANCE

## 2012-07-23 DIAGNOSIS — M109 Gout, unspecified: Secondary | ICD-10-CM

## 2012-07-23 DIAGNOSIS — E860 Dehydration: Secondary | ICD-10-CM

## 2012-07-23 LAB — BASIC METABOLIC PANEL
BUN: 19 mg/dL (ref 6–23)
Chloride: 112 mEq/L (ref 96–112)
Creatinine, Ser: 1.49 mg/dL — ABNORMAL HIGH (ref 0.50–1.10)
Glucose, Bld: 93 mg/dL (ref 70–99)
Potassium: 4.2 mEq/L (ref 3.5–5.1)

## 2012-07-23 LAB — CBC
HCT: 31.9 % — ABNORMAL LOW (ref 36.0–46.0)
Hemoglobin: 10 g/dL — ABNORMAL LOW (ref 12.0–15.0)
MCH: 27.9 pg (ref 26.0–34.0)
MCHC: 31.3 g/dL (ref 30.0–36.0)
MCV: 88.9 fL (ref 78.0–100.0)
RDW: 15.4 % (ref 11.5–15.5)

## 2012-07-23 LAB — URINE CULTURE: Colony Count: 5000

## 2012-07-23 NOTE — Progress Notes (Signed)
Subjective: Patient complaining of left hip pain.  She reports being very debilitated by her left hip.  No other specific complaints.  Objective: Vital signs in last 24 hours: Filed Vitals:   07/23/12 0601 07/23/12 0602 07/23/12 0603 07/23/12 1015  BP: 115/68 129/80 129/83 121/88  Pulse: 72 89 105 80  Temp: 98.3 F (36.8 C)   98 F (36.7 C)  TempSrc: Oral     Resp: 16   18  Height:      Weight:      SpO2: 100%   99%   Weight change:   Intake/Output Summary (Last 24 hours) at 07/23/12 1317 Last data filed at 07/23/12 0609  Gross per 24 hour  Intake 6268.67 ml  Output      1 ml  Net 6267.67 ml    Physical Exam: General: Awake, Oriented, No acute distress. HEENT: EOMI. Neck: Supple CV: S1 and S2 Lungs: Clear to ascultation bilaterally Abdomen: Soft, Nontender, Nondistended, +bowel sounds. Ext: Good pulses. Trace edema.  Good hip flexion and extension, good hip internal and external rotation.  Lab Results: Basic Metabolic Panel:  Lab 07/23/12 1610 07/22/12 0530 07/21/12 1457  NA 143 142 138  K 4.2 3.9 4.3  CL 112 108 99  CO2 24 25 24   GLUCOSE 93 82 73  BUN 19 35* 53*  CREATININE 1.49* 1.79* 2.44*  CALCIUM 9.3 9.0 9.4  MG -- -- --  PHOS -- -- --   Liver Function Tests: No results found for this basename: AST:5,ALT:5,ALKPHOS:5,BILITOT:5,PROT:5,ALBUMIN:5 in the last 168 hours No results found for this basename: LIPASE:5,AMYLASE:5 in the last 168 hours No results found for this basename: AMMONIA:5 in the last 168 hours CBC:  Lab 07/23/12 0615 07/22/12 0530 07/21/12 1457  WBC 4.9 5.9 7.7  NEUTROABS -- -- --  HGB 10.0* 9.9* 11.8*  HCT 31.9* 31.2* 38.3  MCV 88.9 88.1 88.5  PLT 197 215 289   Cardiac Enzymes: No results found for this basename: CKTOTAL:5,CKMB:5,CKMBINDEX:5,TROPONINI:5 in the last 168 hours BNP (last 3 results)  Basename 11/12/11 2141 10/16/11 2347  PROBNP 17.9 158.4*   CBG: No results found for this basename: GLUCAP:5 in the last 168  hours No results found for this basename: HGBA1C:5 in the last 72 hours Other Labs: No components found with this basename: POCBNP:3 No results found for this basename: DDIMER:2 in the last 168 hours No results found for this basename: CHOL:2,HDL:2,LDLCALC:2,TRIG:2,CHOLHDL:2,LDLDIRECT:2 in the last 168 hours No results found for this basename: TSH,T4TOTAL,FREET3,T3FREE,FREET4,THYROIDAB in the last 168 hours No results found for this basename: VITAMINB12:2,FOLATE:2,FERRITIN:2,TIBC:2,IRON:2,RETICCTPCT:2 in the last 168 hours  Micro Results: Recent Results (from the past 240 hour(s))  URINE CULTURE     Status: Normal   Collection Time   07/21/12  3:25 PM      Component Value Range Status Comment   Specimen Description URINE, CLEAN CATCH   Final    Special Requests NONE   Final    Culture  Setup Time 07/21/2012 21:20   Final    Colony Count 5,000 COLONIES/ML   Final    Culture INSIGNIFICANT GROWTH   Final    Report Status 07/23/2012 FINAL   Final   CULTURE, BLOOD (ROUTINE X 2)     Status: Normal (Preliminary result)   Collection Time   07/21/12  7:00 PM      Component Value Range Status Comment   Specimen Description BLOOD LEFT ARM   Final    Special Requests BOTTLES DRAWN AEROBIC AND ANAEROBIC 10CC EA  Final    Culture  Setup Time 07/22/2012 01:40   Final    Culture     Final    Value:        BLOOD CULTURE RECEIVED NO GROWTH TO DATE CULTURE WILL BE HELD FOR 5 DAYS BEFORE ISSUING A FINAL NEGATIVE REPORT   Report Status PENDING   Incomplete   CULTURE, BLOOD (ROUTINE X 2)     Status: Normal (Preliminary result)   Collection Time   07/21/12  7:12 PM      Component Value Range Status Comment   Specimen Description BLOOD LEFT HAND   Final    Special Requests BOTTLES DRAWN AEROBIC ONLY 5CC   Final    Culture  Setup Time 07/22/2012 01:40   Final    Culture     Final    Value:        BLOOD CULTURE RECEIVED NO GROWTH TO DATE CULTURE WILL BE HELD FOR 5 DAYS BEFORE ISSUING A FINAL NEGATIVE  REPORT   Report Status PENDING   Incomplete   MRSA PCR SCREENING     Status: Normal   Collection Time   07/21/12 11:48 PM      Component Value Range Status Comment   MRSA by PCR NEGATIVE  NEGATIVE Final     Studies/Results: Dg Chest 2 View  07/21/2012  *RADIOLOGY REPORT*  Clinical Data: Hypotension.  CHEST - 2 VIEW  Comparison: 07/08/2012.  Findings: The heart is enlarged.  There is tortuosity and ectasia of the thoracic aorta.  The lungs are clear.  No pleural effusion. The bony thorax is intact.  IMPRESSION: Stable cardiac enlargement.  No acute pulmonary findings.  Original Report Authenticated By: P. Loralie Champagne, M.D.    Medications: I have reviewed the patient's current medications. Scheduled Meds:   . sodium chloride   Intravenous STAT  . allopurinol  300 mg Oral Daily  . clorazepate  7.5 mg Oral TID  . docusate sodium  100 mg Oral BID  . donepezil  10 mg Oral QHS  . loratadine  10 mg Oral Daily  . memantine  10 mg Oral BID  . multivitamin with minerals  1 tablet Oral Daily  . oxybutynin  5 mg Oral TID  . pantoprazole  40 mg Oral Q1200  . polyethylene glycol  17 g Oral QHS  . simvastatin  40 mg Oral QHS  . sucralfate  1 g Oral BID   Continuous Infusions:   . sodium chloride 125 mL/hr at 07/23/12 0609   PRN Meds:.bisacodyl, HYDROcodone-acetaminophen  Assessment/Plan: Hypotension  Likely multifactorial secondary to volume depletion, and use of diuretics, and taking her antihypertensive medications.  Improved.  Decrease fluids to [facility].  Continue to oral carvedilol and furosemide.  Consider restarting these medications at lower dose if blood pressure continues to remain stable.  Dehydration  Resolved with IV fluids.  Acute renal failure on CKD (chronic kidney disease) stage 4, GFR 15-29 ml/min  Renal function is improved.  At baseline.  Continue to monitor.   Left hip pain/Chronic pain  Continue home medications.  X-ray of the left hip shows no acute abnormality.   Given persistent symptoms and debility, will get an MRI of the left hip for further evaluation.  Ordered PT/OT evaluation for further evaluation.  Gout Continue allopurinol.  Dementia? Continue donepezil and memantine.  Hyperlipidemia Continue statin.  Anemia  Hemoglobin stable, likely due to chronic kidney disease.  Obesity (BMI 30-39.9)  Diet and exercise as outpatient.  Anxiety  Stable.  DVT prophylaxis: SCDs hose  Code Status: full  Family Communication: Plan discussed with the patient.  Disposition Plan: Depending on MRI of left hip and PT evaluation considered discharged in 1-2 days.    LOS: 2 days  Constance Hackenberg A, MD 07/23/2012, 1:17 PM
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
## 2012-07-24 NOTE — Progress Notes (Signed)
[facility] PROGRESS NOTE  Katherine Cooper VHQ:[id number] DOB: October 03, 1941 DOA: 07/21/2012 PCP: Ron Parker, MD  Assessment/Plan: Principal Problem:  *Hypotension Active Problems:  Obesity (BMI 30-39.9)  CKD (chronic kidney disease) stage 4, GFR 15-29 ml/min  Hip pain  Anxiety  Dehydration  Chronic pain  Anemia  Hypotension  Likely multifactorial secondary to volume depletion, and use of diuretics, and taking her antihypertensive medications. Improved. Decrease fluids to [facility]. Continue to oral carvedilol and furosemide. Consider restarting these medications at lower dose if blood pressure continues to remain stable.   Dehydration  Resolved with IV fluids.   Acute renal failure on CKD (chronic kidney disease) stage 4, GFR 15-29 ml/min  Renal function is improved. At baseline. Continue to monitor.   Left hip pain/Chronic pain  Continue home medications. X-ray of the left hip shows no acute abnormality. Given persistent symptoms and debility,   MRI of the left hip shows edema superficial to the iliotibial band on  the left orthopedics consulted for possible steroid injection/ aspiration of the joint . Ordered PT/OT evaluation for further evaluation.   Gout  Continue allopurinol.  Dementia?  Continue donepezil and memantine.  Hyperlipidemia  Continue statin.  Anemia  Hemoglobin stable, likely due to chronic kidney disease.  Obesity (BMI 30-39.9)  Diet and exercise as outpatient.  Anxiety  Stable.      Code Status: full Family Communication: family updated about patient's clinical progress Disposition Plan:  As above       HPI/Subjective: Patient complaining of left hip pain. She reports being very debilitated by her left hip. No other specific complaints   Objective: Filed Vitals:   07/23/12 1015 07/23/12 1348 07/23/12 2122 07/24/12 0501  BP: 121/88 112/65 98/64 102/63  Pulse: 80 74 82 69  Temp: 98 F (36.7 C) 98.7 F (37.1 C) 98.6 F (37 C) 98.1 F  (36.7 C)  TempSrc:   Oral Oral  Resp: 18 16 20 18   Height:      Weight:      SpO2: 99% 100% 98% 98%    Intake/Output Summary (Last 24 hours) at 07/24/12 1216 Last data filed at 07/23/12 1858  Gross per 24 hour  Intake    480 ml  Output      0 ml  Net    480 ml    Exam:  HENT:  Head: Atraumatic.  Nose: Nose normal.  Mouth/Throat: Oropharynx is clear and moist.  Eyes: Conjunctivae are normal. Pupils are equal, round, and reactive to light. No scleral icterus.  Neck: Neck supple. No tracheal deviation present.  Cardiovascular: Normal rate, regular rhythm, normal heart sounds and intact distal pulses.  Pulmonary/Chest: Effort normal and breath sounds normal. No respiratory distress.  Abdominal: Soft. Normal appearance and bowel sounds are normal. She exhibits no distension. There is no tenderness.  Musculoskeletal: She exhibits no edema and no tenderness.  Neurological: She is alert. No cranial nerve deficit.    Data Reviewed: Basic Metabolic Panel:  Lab 07/23/12 4132 07/22/12 0530 07/21/12 1457  NA 143 142 138  K 4.2 3.9 4.3  CL 112 108 99  CO2 24 25 24   GLUCOSE 93 82 73  BUN 19 35* 53*  CREATININE 1.49* 1.79* 2.44*  CALCIUM 9.3 9.0 9.4  MG -- -- --  PHOS -- -- --    Liver Function Tests: No results found for this basename: AST:5,ALT:5,ALKPHOS:5,BILITOT:5,PROT:5,ALBUMIN:5 in the last 168 hours No results found for this basename: LIPASE:5,AMYLASE:5 in the last 168 hours No results found  for this basename: AMMONIA:5 in the last 168 hours  CBC:  Lab 07/23/12 0615 07/22/12 0530 07/21/12 1457  WBC 4.9 5.9 7.7  NEUTROABS -- -- --  HGB 10.0* 9.9* 11.8*  HCT 31.9* 31.2* 38.3  MCV 88.9 88.1 88.5  PLT 197 215 289    Cardiac Enzymes: No results found for this basename: CKTOTAL:5,CKMB:5,CKMBINDEX:5,TROPONINI:5 in the last 168 hours BNP (last 3 results)  Basename 11/12/11 2141 10/16/11 2347  PROBNP 17.9 158.4*     CBG: No results found for this basename:  GLUCAP:5 in the last 168 hours  Recent Results (from the past 240 hour(s))  URINE CULTURE     Status: Normal   Collection Time   07/21/12  3:25 PM      Component Value Range Status Comment   Specimen Description URINE, CLEAN CATCH   Final    Special Requests NONE   Final    Culture  Setup Time 07/21/2012 21:20   Final    Colony Count 5,000 COLONIES/ML   Final    Culture INSIGNIFICANT GROWTH   Final    Report Status 07/23/2012 FINAL   Final   CULTURE, BLOOD (ROUTINE X 2)     Status: Normal (Preliminary result)   Collection Time   07/21/12  7:00 PM      Component Value Range Status Comment   Specimen Description BLOOD LEFT ARM   Final    Special Requests BOTTLES DRAWN AEROBIC AND ANAEROBIC 10CC EA   Final    Culture  Setup Time 07/22/2012 01:40   Final    Culture     Final    Value:        BLOOD CULTURE RECEIVED NO GROWTH TO DATE CULTURE WILL BE HELD FOR 5 DAYS BEFORE ISSUING A FINAL NEGATIVE REPORT   Report Status PENDING   Incomplete   CULTURE, BLOOD (ROUTINE X 2)     Status: Normal (Preliminary result)   Collection Time   07/21/12  7:12 PM      Component Value Range Status Comment   Specimen Description BLOOD LEFT HAND   Final    Special Requests BOTTLES DRAWN AEROBIC ONLY 5CC   Final    Culture  Setup Time 07/22/2012 01:40   Final    Culture     Final    Value:        BLOOD CULTURE RECEIVED NO GROWTH TO DATE CULTURE WILL BE HELD FOR 5 DAYS BEFORE ISSUING A FINAL NEGATIVE REPORT   Report Status PENDING   Incomplete   MRSA PCR SCREENING     Status: Normal   Collection Time   07/21/12 11:48 PM      Component Value Range Status Comment   MRSA by PCR NEGATIVE  NEGATIVE Final      Studies: Dg Chest 2 View  07/21/2012  *RADIOLOGY REPORT*  Clinical Data: Hypotension.  CHEST - 2 VIEW  Comparison: 07/08/2012.  Findings: The heart is enlarged.  There is tortuosity and ectasia of the thoracic aorta.  The lungs are clear.  No pleural effusion. The bony thorax is intact.  IMPRESSION: Stable  cardiac enlargement.  No acute pulmonary findings.  Original Report Authenticated By: P. Loralie Champagne, M.D.   Dg Chest 2 View  07/08/2012  *RADIOLOGY REPORT*  Clinical Data: Generalized weakness, chest pain, shortness of breath and body aches.  CHEST - 2 VIEW  Comparison: 11/12/2011.  Findings: Trachea is midline.  Heart is mildly enlarged.  Mild pleural parenchymal scarring and volume loss at  the base of the left hemithorax. Possible air space opacification in the right suprahilar region.  Difficult to exclude a tiny right pleural effusion.  IMPRESSION:  1.  Possible air space opacification in the right suprahilar region versus rotation and prominent vascularity.  Follow-up PA and lateral views of the chest in 4-6 weeks recommended. 2.  Possible tiny right pleural effusion.  Original Report Authenticated By: Reyes Ivan, M.D.   Dg Hip Complete Left  07/21/2012  *RADIOLOGY REPORT*  Clinical Data: 71 year old female with recurrent left hip pain.  LEFT HIP - COMPLETE 2+ VIEW  Comparison: 04/21/2008  Findings: There is no evidence of fracture, subluxation or dislocation. The joint spaces are within normal limits. Degenerative changes of the lower lumbar spine are identified. No suspicious focal bony lesions are present.  IMPRESSION: No evidence of acute abnormality.  Degenerative changes of the lower lumbar spine.  Original Report Authenticated By: Rosendo Gros, M.D.   Mr Hip Left Wo Contrast  07/24/2012  *RADIOLOGY REPORT*  Clinical Data: Severe left flank and hip pain.  History of gout.  MRI OF THE LEFT HIP WITHOUT CONTRAST  Technique:  Multiplanar, multisequence MR imaging was performed. No intravenous contrast was administered.  Comparison: Radiographs 07/21/2012.  Pelvic CT 09/16/2010.  Findings: There are small symmetric bilateral hip joint effusions. Mild degenerative changes of both hips are present.  There is no evidence of acute fracture, dislocation or femoral head avascular necrosis.  Mild  degenerative changes of the sacroiliac joints are stable.  There is no evidence of erosive change.  Lower lumbar spine degenerative changes appear grossly stable. There is stable diffuse thinning of the lower anterior abdominal wall musculature.  There is evidence of some pelvic floor relaxation status post hysterectomy.  The appearance is stable.  A small amount of free pelvic fluid is noted.  A small lipoma within the left gluteus minimus muscle is unchanged. The gluteus, iliopsoas and hamstring tendons are intact.  There is mildly asymmetric edema superficial to the left iliotibial band without focal bursal fluid collection.  IMPRESSION:  1.  No acute osseous findings or asymmetric arthropathic changes. No specific signs of active gout. 2.  Mildly asymmetric edema superficial to the iliotibial band on the left could contribute to left hip pain.  No underlying tendon abnormalities identified.  Original Report Authenticated By: Gerrianne Scale, M.D.    Scheduled Meds:   . allopurinol  300 mg Oral Daily  . clorazepate  7.5 mg Oral TID  . docusate sodium  100 mg Oral BID  . donepezil  10 mg Oral QHS  . loratadine  10 mg Oral Daily  . memantine  10 mg Oral BID  . multivitamin with minerals  1 tablet Oral Daily  . oxybutynin  5 mg Oral TID  . pantoprazole  40 mg Oral Q1200  . polyethylene glycol  17 g Oral QHS  . simvastatin  40 mg Oral QHS  . sucralfate  1 g Oral BID   Continuous Infusions:   . sodium chloride 20 mL/hr (07/23/12 1725)    Principal Problem:  *Hypotension Active Problems:  Obesity (BMI 30-39.9)  CKD (chronic kidney disease) stage 4, GFR 15-29 ml/min  Hip pain  Anxiety  Dehydration  Chronic pain  Anemia    Time spent: 40 minutes   [facility]  [facility] Pager [phone]. If 8PM-8AM, please contact night-coverage at [email or web address], password [facility] 07/24/2012, 12:16 PM  LOS: 3 days

## 2012-07-25 MED ORDER — HYDROMORPHONE HCL PF 1 MG/ML IJ SOLN
INTRAMUSCULAR | Status: AC
  Administered 2012-07-25: 0.5 mg via INTRAVENOUS
  Filled 2012-07-25: qty 1

## 2012-07-25 MED ORDER — HYDROMORPHONE HCL PF 1 MG/ML IJ SOLN
1.0000 mg | INTRAMUSCULAR | Status: DC | PRN
  Administered 2012-07-25: 0.5 mg via INTRAVENOUS

## 2012-07-25 NOTE — Care Management Note (Signed)
  Page 1 of 1   07/26/2012     2:46:13 PM   CARE MANAGEMENT NOTE 07/26/2012  Patient:  Katherine Cooper, Katherine Cooper   Account Number:  [id number]  Date Initiated:  07/22/2012  Documentation initiated by:  Ronny Flurry  Subjective/Objective Assessment:   DX: Hypotension Likely multifactorial secondary to volume depletion from ongoing use of diuretics as well as ongoing ingestion of her usual antihypertensive medications     Action/Plan:   Anticipated DC Date:  07/26/2012   Anticipated DC Plan:  HOME/SELF CARE         Choice offered to / List presented to:             Status of service:   Medicare Important Message given?   (If response is "NO", the following Medicare IM given date fields will be blank) Date Medicare IM given:   Date Additional Medicare IM given:    Discharge Disposition:    Per UR Regulation:  Reviewed for med. necessity/level of care/duration of stay  If discussed at Long Length of Stay Meetings, dates discussed:    Comments:  07-26-12 DR Susie Cassette [phone] signed Outpatient Therapy Referral Form . Same faxed to patient 's choose of [address] along with [facility] PT note and DC Summary.   [employer] location , left voice mail asking for confirmation fax was received.  Ronny Flurry RN BSN [phone]  07-25-12 PT recommending outpatient PT . OT 's note from today : patient has met all OT goals. No OT recommendations from today. Paging OT to clarify.  Discussed outpatient PT with patient . Patient is agreeable. Presented list of all outpatient rehab centers. Patient would like [facility].  MD if you are in agreement with outpatient PT please sign referral form in shadow chart.  Thanks  Ronny Flurry RN BSN [phone]

## 2012-07-25 NOTE — Progress Notes (Signed)
Occupational Therapy Treatment Patient Details Name: Katherine Cooper MRN: [id number] DOB: Jan 22, 1941 Today's Date: 07/25/2012 Time: 4098-1191 OT Time Calculation (min): 42 min  OT Assessment / Plan / Recommendation Comments on Treatment Session Pt has met OT goals.  She has necessary assistive devices and is knowledgeable in use.  She continues to have L hip pain requiring use of RW.    Follow Up Recommendations       Barriers to Discharge       Equipment Recommendations  None recommended by OT    Recommendations for Other Services    Frequency Min 2X/week   Plan Discharge plan remains appropriate    Precautions / Restrictions Precautions Precautions: Fall Restrictions Weight Bearing Restrictions: No   Pertinent Vitals/Pain L hip pain, MD aware, did not rate    ADL  Upper Body Bathing: Performed;Set up Where Assessed - Upper Body Bathing: Unsupported sitting Lower Body Bathing: Performed;Supervision/safety Where Assessed - Lower Body Bathing: Supported sit to stand Upper Body Dressing: Performed;Set up Where Assessed - Upper Body Dressing: Unsupported sitting Lower Body Dressing: Performed;Supervision/safety Where Assessed - Lower Body Dressing: Supported sit to stand Toilet Transfer: Performed;Modified independent Toilet Transfer Method: Surveyor, minerals: Materials engineer and Hygiene: Performed;Modified independent Where Assessed - Toileting Clothing Manipulation and Hygiene: Sit to stand from 3-in-1 or toilet Equipment Used: Sock aid;Reacher;Long-handled sponge Transfers/Ambulation Related to ADLs: supervision to ambulate, mod I for transfers ADL Comments: Pt is knowledgeable in use of AE for LB ADL.    OT Diagnosis:    OT Problem List:   OT Treatment Interventions:     OT Goals Acute Rehab OT Goals Time For Goal Achievement: 07/24/12 ADL Goals Pt Will Perform Lower Body Bathing: with supervision;with caregiver  independent in assisting;with adaptive equipment;Unsupported;Sit to stand from chair ADL Goal: Lower Body Bathing - Progress: Met Pt Will Perform Lower Body Dressing: with supervision;with caregiver independent in assisting;Unsupported;with adaptive equipment;with cueing (comment type and amount) ADL Goal: Lower Body Dressing - Progress: Met Pt Will Transfer to Toilet: with modified independence;Ambulation;with DME ADL Goal: Toilet Transfer - Progress: Met Pt Will Perform Toileting - Clothing Manipulation: with modified independence;Standing ADL Goal: Toileting - Clothing Manipulation - Progress: Met Pt Will Perform Toileting - Hygiene: with modified independence;Sit to stand from 3-in-1/toilet;Standing at 3-in-1/toilet ADL Goal: Toileting - Hygiene - Progress: Met  Visit Information  Last OT Received On: 07/25/12 Assistance Needed: +1    Subjective Data      Prior Functioning       Cognition  Overall Cognitive Status: Appears within functional limits for tasks assessed/performed Arousal/Alertness: Awake/alert Orientation Level: Appears intact for tasks assessed Behavior During Session: [facility] for tasks performed    Mobility Bed Mobility Bed Mobility: Supine to Sit Supine to Sit: 6: Modified independent (Device/Increase time);HOB flat Transfers Sit to Stand: 6: Modified independent (Device/Increase time) Stand to Sit: 6: Modified independent (Device/Increase time)   Exercises    Balance    End of Session OT - End of Session Activity Tolerance: Patient tolerated treatment well Patient left: in chair;with call bell/phone within reach;Other (comment) (MD in room)  GO     Evern Bio 07/25/2012, 12:00 PM [phone]

## 2012-07-25 NOTE — Progress Notes (Addendum)
Physical Therapy Treatment Patient Details Name: Katherine Cooper MRN: [id number] DOB: [birth year] Today's Date: 07/25/2012 Time: 1191-4782 PT Time Calculation (min): 27 min  PT Assessment / Plan / Recommendation Comments on Treatment Session  Katherine Cooper is moving very well. Has met all of her goals with the addition of stairs today. Re-addressed clearing a pathway in her home to utilize RW safely with no obstacles. Pt verbalizes understanding and reports she has already asked her home assistance to do this for her. No further acute PT needs. Still recommend OPPT for f/u of hip pain however pt very interested in getting a cortisone shot. I did tell her that physical therapy was not able to perform this for her.     Follow Up Recommendations  Outpatient PT    Barriers to Discharge        Equipment Recommendations  None recommended by PT    Recommendations for Other Services    Frequency     Plan Discharge plan remains appropriate;All goals met and education completed, patient dischaged from PT services    Precautions / Restrictions         Mobility  Bed Mobility Bed Mobility: Not assessed Details for Bed Mobility Assistance: pt already up in the chair Transfers Transfers: Sit to Stand;Stand to Sit Sit to Stand: 6: Modified independent (Device/Increase time) Stand to Sit: 6: Modified independent (Device/Increase time) Ambulation/Gait Ambulation/Gait Assistance: 6: Modified independent (Device/Increase time) Ambulation Distance (Feet): 300 Feet Assistive device: Rolling walker Ambulation/Gait Assistance Details: pt ambulating with slow but safe gait utilizing RW; able to avoid obstacles, perform directional turns, head turns, and sudden stops all safely with no evidence of imbalance; pt appropriately scans the environment during gait Gait Pattern: Step-through pattern;Trunk flexed Stairs Assistance: 6: Modified independent (Device/Increase time) Stair Management Technique: One rail  Right;Step to pattern Number of Stairs: 2       PT Goals Acute Rehab PT Goals PT Goal: Ambulate - Progress: Met  Visit Information  Last PT Received On: 07/25/12 Assistance Needed: +1    Subjective Data  Subjective: I think she gave me a shot, and I think it helped a little bit (in reference to IV pain medication).    Cognition  Overall Cognitive Status: Appears within functional limits for tasks assessed/performed Arousal/Alertness: Awake/alert Orientation Level: Appears intact for tasks assessed Behavior During Session: [facility] for tasks performed Cognition - Other Comments: a bit of difficulty getting pt to focus on what I am trying to tell her but othewise pt functional    Balance     End of Session PT - End of Session Equipment Utilized During Treatment: Gait belt Activity Tolerance: Patient tolerated treatment well Patient left: in chair;with call bell/phone within reach   GP     [facility] HELEN 07/25/2012, 3:36 PM

## 2012-07-25 NOTE — Progress Notes (Signed)
[facility] PROGRESS NOTE  Katherine Cooper ZOX:[id number] DOB: 1941/02/17 DOA: 07/21/2012 PCP: Ron Parker, MD  Assessment/Plan: Principal Problem:  *Hypotension Active Problems:  Obesity (BMI 30-39.9)  CKD (chronic kidney disease) stage 4, GFR 15-29 ml/min  Hip pain  Anxiety  Dehydration  Chronic pain  Anemia  Hypotension  Likely multifactorial secondary to volume depletion, and use of diuretics, and taking her antihypertensive medications. Improved. Decrease fluids to [facility]. Continue to oral carvedilol and furosemide. Consider restarting these medications at lower dose if blood pressure continues to remain stable.   Dehydration  Resolved with IV fluids.   Acute renal failure on CKD (chronic kidney disease) stage 4, GFR 15-29 ml/min  Renal function is improving. At baseline. Continue to monitor.    Left hip pain/Chronic pain  Continue home medications. X-ray of the left hip shows no acute abnormality. Given persistent symptoms and debility, MRI of the left hip shows edema superficial to the iliotibial band on  the left orthopedics consulted for possible steroid injection/ aspiration of the joint . Dr. do not consulted, Ordered PT/OT evaluation for further evaluation.    Gout  Continue allopurinol.  Dementia?  Continue donepezil and memantine.  Hyperlipidemia  Continue statin.  Anemia  Hemoglobin stable, likely due to chronic kidney disease.  Obesity (BMI 30-39.9)  Diet and exercise as outpatient.  Anxiety  Stable.     HPI/Subjective: Complaining of difficulty ambulation due to left hip pain  Objective: Filed Vitals:   07/24/12 0501 07/24/12 1350 07/24/12 2205 07/25/12 0647  BP: 102/63 97/62 104/75 126/79  Pulse: 69 79 80 79  Temp: 98.1 F (36.7 C) 98.3 F (36.8 C) 98.7 F (37.1 C) 98 F (36.7 C)  TempSrc: Oral Oral  Oral  Resp: 18 19 18 18   Height:      Weight:      SpO2: 98% 99% 97% 100%    Intake/Output Summary (Last 24 hours) at 07/25/12  1313 Last data filed at 07/24/12 1800  Gross per 24 hour  Intake    600 ml  Output      0 ml  Net    600 ml    Exam:  HENT:  Head: Atraumatic.  Nose: Nose normal.  Mouth/Throat: Oropharynx is clear and moist.  Eyes: Conjunctivae are normal. Pupils are equal, round, and reactive to light. No scleral icterus.  Neck: Neck supple. No tracheal deviation present.  Cardiovascular: Normal rate, regular rhythm, normal heart sounds and intact distal pulses.  Pulmonary/Chest: Effort normal and breath sounds normal. No respiratory distress.  Abdominal: Soft. Normal appearance and bowel sounds are normal. She exhibits no distension. There is no tenderness.  Musculoskeletal: She exhibits no edema and no tenderness.  Neurological: She is alert. No cranial nerve deficit.    Data Reviewed: Basic Metabolic Panel:  Lab 07/23/12 8119 07/22/12 0530 07/21/12 1457  NA 143 142 138  K 4.2 3.9 4.3  CL 112 108 99  CO2 24 25 24   GLUCOSE 93 82 73  BUN 19 35* 53*  CREATININE 1.49* 1.79* 2.44*  CALCIUM 9.3 9.0 9.4  MG -- -- --  PHOS -- -- --    Liver Function Tests: No results found for this basename: AST:5,ALT:5,ALKPHOS:5,BILITOT:5,PROT:5,ALBUMIN:5 in the last 168 hours No results found for this basename: LIPASE:5,AMYLASE:5 in the last 168 hours No results found for this basename: AMMONIA:5 in the last 168 hours  CBC:  Lab 07/23/12 0615 07/22/12 0530 07/21/12 1457  WBC 4.9 5.9 7.7  NEUTROABS -- -- --  HGB 10.0* 9.9* 11.8*  HCT 31.9* 31.2* 38.3  MCV 88.9 88.1 88.5  PLT 197 215 289    Cardiac Enzymes: No results found for this basename: CKTOTAL:5,CKMB:5,CKMBINDEX:5,TROPONINI:5 in the last 168 hours BNP (last 3 results)  Basename 11/12/11 2141 10/16/11 2347  PROBNP 17.9 158.4*     CBG: No results found for this basename: GLUCAP:5 in the last 168 hours  Recent Results (from the past 240 hour(s))  URINE CULTURE     Status: Normal   Collection Time   07/21/12  3:25 PM      Component  Value Range Status Comment   Specimen Description URINE, CLEAN CATCH   Final    Special Requests NONE   Final    Culture  Setup Time 07/21/2012 21:20   Final    Colony Count 5,000 COLONIES/ML   Final    Culture INSIGNIFICANT GROWTH   Final    Report Status 07/23/2012 FINAL   Final   CULTURE, BLOOD (ROUTINE X 2)     Status: Normal (Preliminary result)   Collection Time   07/21/12  7:00 PM      Component Value Range Status Comment   Specimen Description BLOOD LEFT ARM   Final    Special Requests BOTTLES DRAWN AEROBIC AND ANAEROBIC 10CC EA   Final    Culture  Setup Time 07/22/2012 01:40   Final    Culture     Final    Value:        BLOOD CULTURE RECEIVED NO GROWTH TO DATE CULTURE WILL BE HELD FOR 5 DAYS BEFORE ISSUING A FINAL NEGATIVE REPORT   Report Status PENDING   Incomplete   CULTURE, BLOOD (ROUTINE X 2)     Status: Normal (Preliminary result)   Collection Time   07/21/12  7:12 PM      Component Value Range Status Comment   Specimen Description BLOOD LEFT HAND   Final    Special Requests BOTTLES DRAWN AEROBIC ONLY 5CC   Final    Culture  Setup Time 07/22/2012 01:40   Final    Culture     Final    Value:        BLOOD CULTURE RECEIVED NO GROWTH TO DATE CULTURE WILL BE HELD FOR 5 DAYS BEFORE ISSUING A FINAL NEGATIVE REPORT   Report Status PENDING   Incomplete   MRSA PCR SCREENING     Status: Normal   Collection Time   07/21/12 11:48 PM      Component Value Range Status Comment   MRSA by PCR NEGATIVE  NEGATIVE Final      Studies: Dg Chest 2 View  07/21/2012  *RADIOLOGY REPORT*  Clinical Data: Hypotension.  CHEST - 2 VIEW  Comparison: 07/08/2012.  Findings: The heart is enlarged.  There is tortuosity and ectasia of the thoracic aorta.  The lungs are clear.  No pleural effusion. The bony thorax is intact.  IMPRESSION: Stable cardiac enlargement.  No acute pulmonary findings.  Original Report Authenticated By: P. Loralie Champagne, M.D.   Dg Chest 2 View  07/08/2012  *RADIOLOGY REPORT*   Clinical Data: Generalized weakness, chest pain, shortness of breath and body aches.  CHEST - 2 VIEW  Comparison: 11/12/2011.  Findings: Trachea is midline.  Heart is mildly enlarged.  Mild pleural parenchymal scarring and volume loss at the base of the left hemithorax. Possible air space opacification in the right suprahilar region.  Difficult to exclude a tiny right pleural effusion.  IMPRESSION:  1.  Possible  air space opacification in the right suprahilar region versus rotation and prominent vascularity.  Follow-up PA and lateral views of the chest in 4-6 weeks recommended. 2.  Possible tiny right pleural effusion.  Original Report Authenticated By: Reyes Ivan, M.D.   Dg Hip Complete Left  07/21/2012  *RADIOLOGY REPORT*  Clinical Data: 71 year old female with recurrent left hip pain.  LEFT HIP - COMPLETE 2+ VIEW  Comparison: 04/21/2008  Findings: There is no evidence of fracture, subluxation or dislocation. The joint spaces are within normal limits. Degenerative changes of the lower lumbar spine are identified. No suspicious focal bony lesions are present.  IMPRESSION: No evidence of acute abnormality.  Degenerative changes of the lower lumbar spine.  Original Report Authenticated By: Rosendo Gros, M.D.   Mr Hip Left Wo Contrast  07/24/2012  *RADIOLOGY REPORT*  Clinical Data: Severe left flank and hip pain.  History of gout.  MRI OF THE LEFT HIP WITHOUT CONTRAST  Technique:  Multiplanar, multisequence MR imaging was performed. No intravenous contrast was administered.  Comparison: Radiographs 07/21/2012.  Pelvic CT 09/16/2010.  Findings: There are small symmetric bilateral hip joint effusions. Mild degenerative changes of both hips are present.  There is no evidence of acute fracture, dislocation or femoral head avascular necrosis.  Mild degenerative changes of the sacroiliac joints are stable.  There is no evidence of erosive change.  Lower lumbar spine degenerative changes appear grossly stable.  There is stable diffuse thinning of the lower anterior abdominal wall musculature.  There is evidence of some pelvic floor relaxation status post hysterectomy.  The appearance is stable.  A small amount of free pelvic fluid is noted.  A small lipoma within the left gluteus minimus muscle is unchanged. The gluteus, iliopsoas and hamstring tendons are intact.  There is mildly asymmetric edema superficial to the left iliotibial band without focal bursal fluid collection.  IMPRESSION:  1.  No acute osseous findings or asymmetric arthropathic changes. No specific signs of active gout. 2.  Mildly asymmetric edema superficial to the iliotibial band on the left could contribute to left hip pain.  No underlying tendon abnormalities identified.  Original Report Authenticated By: Gerrianne Scale, M.D.    Scheduled Meds:   . allopurinol  300 mg Oral Daily  . clorazepate  7.5 mg Oral TID  . docusate sodium  100 mg Oral BID  . donepezil  10 mg Oral QHS  . HYDROmorphone      . loratadine  10 mg Oral Daily  . memantine  10 mg Oral BID  . multivitamin with minerals  1 tablet Oral Daily  . oxybutynin  5 mg Oral TID  . pantoprazole  40 mg Oral Q1200  . polyethylene glycol  17 g Oral QHS  . simvastatin  40 mg Oral QHS  . sucralfate  1 g Oral BID   Continuous Infusions:   . sodium chloride 20 mL/hr (07/23/12 1725)    Principal Problem:  *Hypotension Active Problems:  Obesity (BMI 30-39.9)  CKD (chronic kidney disease) stage 4, GFR 15-29 ml/min  Hip pain  Anxiety  Dehydration  Chronic pain  Anemia    Time spent: 40 minutes   [facility]  [facility] Pager [phone]. If 8PM-8AM, please contact night-coverage at [email or web address], password [facility] 07/25/2012, 1:13 PM  LOS: 4 days

## 2012-07-25 NOTE — Consult Note (Signed)
Reason for Consult: Left hip  pain Referring Physician: abrol  Katherine Cooper is an 71 y.o. female.  HPI: Patient is a 71 year old woman complaining of left posterior hip pain. She states she has pain worse with sitting pain with activities. Past medical history was reviewed she does have an MRI scan of her back in 2008 which showed some degenerative changes of her lumbar spine.  Past Medical History  Diagnosis Date  . Arthritis   . Hypertension   . Gout   . Obesity   . Angina   . Heart murmur   . Chronic kidney disease   . Stroke   . Recurrent upper respiratory infection (URI)   . Hypothyroidism   . Blood transfusion   . Headache   . Anxiety   . Dysrhythmia   . Anemia   . CAP (community acquired pneumonia)     Past Surgical History  Procedure Date  . Uterine fibroid surgery     Family History  Problem Relation Age of Onset  . Asthma Mother   . Heart attack Father     Social History:  reports that she quit smoking about 4 months ago. Her smoking use included Cigarettes. She smoked .25 packs per day. She does not have any smokeless tobacco history on file. She reports that she does not drink alcohol or use illicit drugs.  Allergies:  Allergies  Allergen Reactions  . Sulfa Antibiotics Itching    Medications: I have reviewed the patient's current medications.  No results found for this or any previous visit (from the past 48 hour(s)).  No results found.  Review of Systems  All other systems reviewed and are negative.   Blood pressure 108/79, pulse 84, temperature 98.6 F (37 C), temperature source Oral, resp. rate 16, height 5\' 5"  (1.651 m), weight 98.8 kg (217 lb 13 oz), SpO2 98.00%. Physical Exam on examination patient has no pain with internal or external rotation of the left hip. She has a positive straight leg raise on the left. She has good motor strength in all motor groups of both lower extremities and no focal motor weakness. Review of the MRI scan of her  left hip is essentially normal. There are no radiographs of her lumbar spine at this time but she does have an old MRI scan from 2008 which showed significant degenerative disc disease. She complains of chronic lower back pain. Assessment/Plan: Assessment: Left posterior hip radicular pain with history of degenerative disc disease and history of back pain.  Plan: Would recommend radiologic studies for lumbar spine would recommend plain films followed by an MRI scan but patient may benefit from steroid dose pack versus epidural steroid injections. This could be treated inpatient or outpatient. I will followup as needed.  DUDA,MARCUS V 07/25/2012, 7:45 PM

## 2012-07-26 ENCOUNTER — Inpatient Hospital Stay (HOSPITAL_COMMUNITY): Payer: PRIVATE HEALTH INSURANCE

## 2012-07-26 ENCOUNTER — Other Ambulatory Visit (HOSPITAL_COMMUNITY): Payer: PRIVATE HEALTH INSURANCE

## 2012-07-26 LAB — BASIC METABOLIC PANEL
BUN: 12 mg/dL (ref 6–23)
Calcium: 9.4 mg/dL (ref 8.4–10.5)
Creatinine, Ser: 1.38 mg/dL — ABNORMAL HIGH (ref 0.50–1.10)
GFR calc non Af Amer: 37 mL/min — ABNORMAL LOW (ref >90)
Glucose, Bld: 131 mg/dL — ABNORMAL HIGH (ref 70–99)
Potassium: 4.5 mEq/L (ref 3.5–5.1)

## 2012-07-26 MED ORDER — HYDROCODONE-ACETAMINOPHEN 5-500 MG PO TABS: 1.0000 | ORAL_TABLET | Freq: Two times a day (BID) | ORAL | Status: DC | PRN

## 2012-07-26 MED ORDER — METHYLPREDNISOLONE SODIUM SUCC 40 MG IJ SOLR
40.0000 mg | Freq: Two times a day (BID) | INTRAMUSCULAR | Status: DC
  Administered 2012-07-26: 40 mg via INTRAVENOUS
  Filled 2012-07-26 (×2): qty 1

## 2012-07-26 MED ORDER — PREDNISONE 1 MG PO TABS: ORAL_TABLET | ORAL | Status: DC

## 2012-07-26 NOTE — Discharge Summary (Signed)
Physician Discharge Summary  KEELY DRENNAN MRN: [id number] DOB/AGE: 06/19/1941 71 y.o.  PCP: Ron Parker, MD   Admit date: 07/21/2012 Discharge date: 07/26/2012  Discharge Diagnoses:     *Hypotension Obesity (BMI 30-39.9)  CKD (chronic kidney disease) stage 4, GFR 15-29 ml/min  Hip pain  Anxiety  Dehydration  Chronic pain  Anemia   Medication List  As of 07/26/2012 11:05 AM   TAKE these medications         albuterol 108 (90 BASE) MCG/ACT inhaler   Commonly known as: PROVENTIL HFA;VENTOLIN HFA   Inhale 2 puffs into the lungs every 6 (six) hours as needed. For shortness of breath      allopurinol 300 MG tablet   Commonly known as: ZYLOPRIM   Take 300 mg by mouth daily.      carvedilol 3.125 MG tablet   Commonly known as: COREG   Take 3.125 mg by mouth 2 (two) times daily with a meal.      clorazepate 7.5 MG tablet   Commonly known as: TRANXENE   Take 7.5 mg by mouth 3 (three) times daily.      donepezil 10 MG tablet   Commonly known as: ARICEPT   Take 10 mg by mouth at bedtime.      furosemide 40 MG tablet   Commonly known as: LASIX   Take 40 mg by mouth 2 (two) times daily.      HYDROcodone-acetaminophen 5-500 MG per tablet   Commonly known as: VICODIN   Take 1 tablet by mouth 2 (two) times daily as needed. For pain      loratadine 10 MG tablet   Commonly known as: CLARITIN   Take 10 mg by mouth daily.      memantine 10 MG tablet   Commonly known as: NAMENDA   Take 10 mg by mouth 2 (two) times daily.      multivitamin with minerals Tabs   Take 1 tablet by mouth daily.      omeprazole 20 MG capsule   Commonly known as: PRILOSEC   Take 20 mg by mouth daily with breakfast.      oxybutynin 5 MG tablet   Commonly known as: DITROPAN   Take 5 mg by mouth 3 (three) times daily.      potassium chloride SA 20 MEQ tablet   Commonly known as: K-DUR,KLOR-CON   Take 10 mEq by mouth daily.      predniSONE 1 MG tablet   Commonly known as: DELTASONE     5 mg tablets  6 tablets for 3 days  5 tablets for 3 days    4 tablets for 3 days  3 tablets for 3 days  2 tablets for 3 days  1 tablet for 3 days then DC      simvastatin 40 MG tablet   Commonly known as: ZOCOR   Take 40 mg by mouth at bedtime.      sucralfate 1 G tablet   Commonly known as: CARAFATE   Take 1 g by mouth 2 (two) times daily.            Discharge Condition: Stable Disposition: 01-Home or Self Care   Consults:  #1 orthopedics Dr. Lajoyce Corners  Significant Diagnostic Studies: Dg Chest 2 View  07/21/2012  *RADIOLOGY REPORT*  Clinical Data: Hypotension.  CHEST - 2 VIEW  Comparison: 07/08/2012.  Findings: The heart is enlarged.  There is tortuosity and ectasia of the thoracic aorta.  The lungs  are clear.  No pleural effusion. The bony thorax is intact.  IMPRESSION: Stable cardiac enlargement.  No acute pulmonary findings.  Original Report Authenticated By: P. Loralie Champagne, M.D.   Dg Chest 2 View  07/08/2012  *RADIOLOGY REPORT*  Clinical Data: Generalized weakness, chest pain, shortness of breath and body aches.  CHEST - 2 VIEW  Comparison: 11/12/2011.  Findings: Trachea is midline.  Heart is mildly enlarged.  Mild pleural parenchymal scarring and volume loss at the base of the left hemithorax. Possible air space opacification in the right suprahilar region.  Difficult to exclude a tiny right pleural effusion.  IMPRESSION:  1.  Possible air space opacification in the right suprahilar region versus rotation and prominent vascularity.  Follow-up PA and lateral views of the chest in 4-6 weeks recommended. 2.  Possible tiny right pleural effusion.  Original Report Authenticated By: Reyes Ivan, M.D.   Dg Hip Complete Left  07/21/2012  *RADIOLOGY REPORT*  Clinical Data: 71 year old female with recurrent left hip pain.  LEFT HIP - COMPLETE 2+ VIEW  Comparison: 04/21/2008  Findings: There is no evidence of fracture, subluxation or dislocation. The joint spaces are  within normal limits. Degenerative changes of the lower lumbar spine are identified. No suspicious focal bony lesions are present.  IMPRESSION: No evidence of acute abnormality.  Degenerative changes of the lower lumbar spine.  Original Report Authenticated By: Rosendo Gros, M.D.   Mr Hip Left Wo Contrast  07/24/2012  *RADIOLOGY REPORT*  Clinical Data: Severe left flank and hip pain.  History of gout.  MRI OF THE LEFT HIP WITHOUT CONTRAST  Technique:  Multiplanar, multisequence MR imaging was performed. No intravenous contrast was administered.  Comparison: Radiographs 07/21/2012.  Pelvic CT 09/16/2010.  Findings: There are small symmetric bilateral hip joint effusions. Mild degenerative changes of both hips are present.  There is no evidence of acute fracture, dislocation or femoral head avascular necrosis.  Mild degenerative changes of the sacroiliac joints are stable.  There is no evidence of erosive change.  Lower lumbar spine degenerative changes appear grossly stable. There is stable diffuse thinning of the lower anterior abdominal wall musculature.  There is evidence of some pelvic floor relaxation status post hysterectomy.  The appearance is stable.  A small amount of free pelvic fluid is noted.  A small lipoma within the left gluteus minimus muscle is unchanged. The gluteus, iliopsoas and hamstring tendons are intact.  There is mildly asymmetric edema superficial to the left iliotibial band without focal bursal fluid collection.  IMPRESSION:  1.  No acute osseous findings or asymmetric arthropathic changes. No specific signs of active gout. 2.  Mildly asymmetric edema superficial to the iliotibial band on the left could contribute to left hip pain.  No underlying tendon abnormalities identified.  Original Report Authenticated By: Gerrianne Scale, M.D.      Microbiology: Recent Results (from the past 240 hour(s))  URINE CULTURE     Status: Normal   Collection Time   07/21/12  3:25 PM       Component Value Range Status Comment   Specimen Description URINE, CLEAN CATCH   Final    Special Requests NONE   Final    Culture  Setup Time 07/21/2012 21:20   Final    Colony Count 5,000 COLONIES/ML   Final    Culture INSIGNIFICANT GROWTH   Final    Report Status 07/23/2012 FINAL   Final   CULTURE, BLOOD (ROUTINE X 2)  Status: Normal (Preliminary result)   Collection Time   07/21/12  7:00 PM      Component Value Range Status Comment   Specimen Description BLOOD LEFT ARM   Final    Special Requests BOTTLES DRAWN AEROBIC AND ANAEROBIC 10CC EA   Final    Culture  Setup Time 07/22/2012 01:40   Final    Culture     Final    Value:        BLOOD CULTURE RECEIVED NO GROWTH TO DATE CULTURE WILL BE HELD FOR 5 DAYS BEFORE ISSUING A FINAL NEGATIVE REPORT   Report Status PENDING   Incomplete   CULTURE, BLOOD (ROUTINE X 2)     Status: Normal (Preliminary result)   Collection Time   07/21/12  7:12 PM      Component Value Range Status Comment   Specimen Description BLOOD LEFT HAND   Final    Special Requests BOTTLES DRAWN AEROBIC ONLY 5CC   Final    Culture  Setup Time 07/22/2012 01:40   Final    Culture     Final    Value:        BLOOD CULTURE RECEIVED NO GROWTH TO DATE CULTURE WILL BE HELD FOR 5 DAYS BEFORE ISSUING A FINAL NEGATIVE REPORT   Report Status PENDING   Incomplete   MRSA PCR SCREENING     Status: Normal   Collection Time   07/21/12 11:48 PM      Component Value Range Status Comment   MRSA by PCR NEGATIVE  NEGATIVE Final      Labs: Results for orders placed during the hospital encounter of 07/21/12 (from the past 48 hour(s))  BASIC METABOLIC PANEL     Status: Abnormal   Collection Time   07/26/12  6:05 AM      Component Value Range Comment   Sodium 144  135 - 145 mEq/L    Potassium 4.5  3.5 - 5.1 mEq/L    Chloride 110  96 - 112 mEq/L    CO2 26  19 - 32 mEq/L    Glucose, Bld 131 (*) 70 - 99 mg/dL    BUN 12  6 - 23 mg/dL    Creatinine, Ser 0.86 (*) 0.50 - 1.10 mg/dL     Calcium 9.4  8.4 - 10.5 mg/dL    GFR calc non Af Amer 37 (*) >90 mL/min    GFR calc Af Amer 43 (*) >90 mL/min      HPI : 71 year-old woman complaining of left posterior hip pain. She states she has pain worse with sitting pain with activities. Past medical history was reviewed she does have an MRI scan of her back in 2008 which showed some degenerative changes of her lumbar spine   HOSPITAL COURSE:  #1 low back pain, MRI showed asymmetric edema superficial to the iliotibial band on the left could contribute to left hip pain. Dr. Lajoyce Corners was consulted, will recommend trial of either steroids, or further workup including, MRI of the lumbar spine. MRI of the lumbar spine was attempted however the patient declined it. She was started on IV Solu-Medrol, will defer to Dr. Lajoyce Corners, if he wants to do epidural steroid injections mother patient will be started on prednisone taper for her hip pain. Physical occupational therapy was consulted, outpatient PT is recommended  #2 acute renal insufficiency patient presented with a creatinine of 1.79, this was likely prerenal and improved with IV hydration. Creatinine prior to discharge is 1.38  #3  gout stable, on allopurinol  #4 dementia continue donepezil and memantine   Discharge Exam:  Blood pressure 110/55, pulse 80, temperature 98.4 F (36.9 C), temperature source Oral, resp. rate 14, height 5\' 5"  (1.651 m), weight 98.8 kg (217 lb 13 oz), SpO2 99.00%.  Head: Atraumatic.  Nose: Nose normal.  Mouth/Throat: Oropharynx is clear and moist.  Eyes: Conjunctivae are normal. Pupils are equal, round, and reactive to light. No scleral icterus.  Neck: Neck supple. No tracheal deviation present.  Cardiovascular: Normal rate, regular rhythm, normal heart sounds and intact distal pulses.  Pulmonary/Chest: Effort normal and breath sounds normal. No respiratory distress.  Abdominal: Soft. Normal appearance and bowel sounds are normal. She exhibits no distension. There  is no tenderness.  Musculoskeletal: She exhibits no edema and no tenderness.  Neurological: She is alert. No cranial nerve deficit.       Discharge Orders    Future Orders Please Complete By Expires   Diet - low sodium heart healthy      Increase activity slowly      Call MD for:  redness, tenderness, or signs of infection (pain, swelling, redness, odor or green/yellow discharge around incision site)      Call MD for:  severe uncontrolled pain      Call MD for:  persistant nausea and vomiting           Signed: Richarda Overlie 07/26/2012, 11:05 AM

## 2012-07-26 NOTE — Progress Notes (Signed)
PMD called stated that patient for discharge but PMD wants Dr. Lajoyce Cooper to see patient prior to discharge . Placed a call to Dr. Lajoyce Cooper office and relayed it to office nurse. Patient inform about the plan.

## 2012-07-26 NOTE — Progress Notes (Signed)
Discharge home.

## 2012-07-28 LAB — CULTURE, BLOOD (ROUTINE X 2)
Culture: NO GROWTH
Culture: NO GROWTH

## 2012-08-27 ENCOUNTER — Other Ambulatory Visit: Payer: Self-pay | Admitting: Orthopedic Surgery

## 2012-08-27 DIAGNOSIS — M545 Low back pain: Secondary | ICD-10-CM

## 2012-08-27 DIAGNOSIS — R531 Weakness: Secondary | ICD-10-CM

## 2012-09-05 ENCOUNTER — Inpatient Hospital Stay: Admission: RE | Admit: 2012-09-05 | Payer: PRIVATE HEALTH INSURANCE | Source: Ambulatory Visit

## 2012-09-17 ENCOUNTER — Emergency Department (HOSPITAL_COMMUNITY)
Admission: EM | Admit: 2012-09-17 | Discharge: 2012-09-17 | Disposition: A | Discharge status: Home / Self Care | Payer: PRIVATE HEALTH INSURANCE | Attending: Emergency Medicine | Admitting: Emergency Medicine

## 2012-09-17 ENCOUNTER — Encounter (HOSPITAL_COMMUNITY): Payer: Self-pay | Admitting: Emergency Medicine

## 2012-09-17 DIAGNOSIS — Z8673 Personal history of transient ischemic attack (TIA), and cerebral infarction without residual deficits: Secondary | ICD-10-CM | POA: Insufficient documentation

## 2012-09-17 DIAGNOSIS — F411 Generalized anxiety disorder: Secondary | ICD-10-CM | POA: Insufficient documentation

## 2012-09-17 DIAGNOSIS — Z8249 Family history of ischemic heart disease and other diseases of the circulatory system: Secondary | ICD-10-CM | POA: Insufficient documentation

## 2012-09-17 DIAGNOSIS — Z825 Family history of asthma and other chronic lower respiratory diseases: Secondary | ICD-10-CM | POA: Insufficient documentation

## 2012-09-17 DIAGNOSIS — E669 Obesity, unspecified: Secondary | ICD-10-CM | POA: Insufficient documentation

## 2012-09-17 DIAGNOSIS — I129 Hypertensive chronic kidney disease with stage 1 through stage 4 chronic kidney disease, or unspecified chronic kidney disease: Secondary | ICD-10-CM | POA: Insufficient documentation

## 2012-09-17 DIAGNOSIS — Z882 Allergy status to sulfonamides status: Secondary | ICD-10-CM | POA: Insufficient documentation

## 2012-09-17 DIAGNOSIS — N189 Chronic kidney disease, unspecified: Secondary | ICD-10-CM | POA: Insufficient documentation

## 2012-09-17 DIAGNOSIS — Z87891 Personal history of nicotine dependence: Secondary | ICD-10-CM | POA: Insufficient documentation

## 2012-09-17 DIAGNOSIS — N390 Urinary tract infection, site not specified: Secondary | ICD-10-CM | POA: Insufficient documentation

## 2012-09-17 DIAGNOSIS — M109 Gout, unspecified: Secondary | ICD-10-CM | POA: Insufficient documentation

## 2012-09-17 LAB — CBC WITH DIFFERENTIAL/PLATELET
Eosinophils Relative: 3 % (ref 0–5)
HCT: 35.5 % — ABNORMAL LOW (ref 36.0–46.0)
Lymphocytes Relative: 16 % (ref 12–46)
Lymphs Abs: 1.3 10*3/uL (ref 0.7–4.0)
MCV: 89.4 fL (ref 78.0–100.0)
Neutro Abs: 6 10*3/uL (ref 1.7–7.7)
Platelets: 259 10*3/uL (ref 150–400)
RBC: 3.97 MIL/uL (ref 3.87–5.11)
WBC: 8.2 10*3/uL (ref 4.0–10.5)

## 2012-09-17 LAB — URINE MICROSCOPIC-ADD ON

## 2012-09-17 LAB — URINALYSIS, ROUTINE W REFLEX MICROSCOPIC
Glucose, UA: NEGATIVE mg/dL
Hgb urine dipstick: NEGATIVE
Protein, ur: NEGATIVE mg/dL
Specific Gravity, Urine: 1.017 (ref 1.005–1.030)
pH: 6.5 (ref 5.0–8.0)

## 2012-09-17 LAB — BASIC METABOLIC PANEL
CO2: 23 mEq/L (ref 19–32)
Calcium: 9.1 mg/dL (ref 8.4–10.5)
Chloride: 104 mEq/L (ref 96–112)
Glucose, Bld: 128 mg/dL — ABNORMAL HIGH (ref 70–99)
Sodium: 138 mEq/L (ref 135–145)

## 2012-09-17 MED ORDER — CIPROFLOXACIN HCL 250 MG PO TABS: 250.0000 mg | ORAL_TABLET | Freq: Two times a day (BID) | ORAL | Status: DC

## 2012-09-17 MED ORDER — KETOROLAC TROMETHAMINE 60 MG/2ML IM SOLN
60.0000 mg | Freq: Once | INTRAMUSCULAR | Status: AC
  Administered 2012-09-17: 60 mg via INTRAMUSCULAR
  Filled 2012-09-17: qty 2

## 2012-09-17 MED ORDER — HYDROCODONE-ACETAMINOPHEN 5-325 MG PO TABS
1.0000 | ORAL_TABLET | Freq: Once | ORAL | Status: AC
  Administered 2012-09-17: 1 via ORAL
  Filled 2012-09-17: qty 1

## 2012-09-17 NOTE — ED Notes (Signed)
Pt brought in by EMS for c/o generalized pain unable to get up to get her meds Pain in toes mostly

## 2012-09-17 NOTE — ED Provider Notes (Signed)
History     CSN: [id number]  Arrival date & time 09/17/12  0919   First MD Initiated Contact with Patient 09/17/12 [phone]      Chief Complaint  Patient presents with  . Toe Pain  . Gout    (Consider location/radiation/quality/duration/timing/severity/associated sxs/prior treatment) HPI Pt presents complaining of joint pain in feet and bl wrists and hands. No fevers chills. Known history of gout for which she is being treated by PMD with colchicine and allopurinol. Pt also c/o urinary hesitancy and think she may have a UTI.  Past Medical History  Diagnosis Date  . Arthritis   . Hypertension   . Gout   . Obesity   . Angina   . Heart murmur   . Chronic kidney disease   . Stroke   . Recurrent upper respiratory infection (URI)   . Hypothyroidism   . Blood transfusion   . Headache   . Anxiety   . Dysrhythmia   . Anemia   . CAP (community acquired pneumonia)     Past Surgical History  Procedure Date  . Uterine fibroid surgery     Family History  Problem Relation Age of Onset  . Asthma Mother   . Heart attack Father     History  Substance Use Topics  . Smoking status: Former Smoker -- 0.2 packs/day    Types: Cigarettes    Quit date: 03/13/2012  . Smokeless tobacco: Not on file  . Alcohol Use: No    OB History    Grav Para Term Preterm Abortions TAB SAB Ect Mult Living                  Review of Systems  Constitutional: Negative for fever and chills.  Respiratory: Negative for cough and shortness of breath.   Cardiovascular: Negative for chest pain.  Gastrointestinal: Negative for nausea, vomiting and abdominal pain.  Genitourinary: Positive for difficulty urinating. Negative for dysuria, frequency, hematuria and flank pain.  Musculoskeletal: Positive for arthralgias. Negative for myalgias.  Skin: Negative for rash.  Neurological: Negative for dizziness, light-headedness, numbness and headaches.    Allergies  Sulfa antibiotics  Home Medications    Current Outpatient Rx  Name Route Sig Dispense Refill  . ALBUTEROL SULFATE HFA 108 (90 BASE) MCG/ACT IN AERS Inhalation Inhale 2 puffs into the lungs every 6 (six) hours as needed. For shortness of breath     . ALLOPURINOL 300 MG PO TABS Oral Take 300 mg by mouth daily.    Marland Kitchen CARVEDILOL 3.125 MG PO TABS Oral Take 3.125 mg by mouth 2 (two) times daily with a meal.      . CLORAZEPATE DIPOTASSIUM 7.5 MG PO TABS Oral Take 7.5 mg by mouth 3 (three) times daily.    . COLCHICINE 0.6 MG PO TABS Oral Take 0.6 mg by mouth daily.    . DONEPEZIL HCL 10 MG PO TABS Oral Take 10 mg by mouth at bedtime.      . FUROSEMIDE 40 MG PO TABS Oral Take 40 mg by mouth 2 (two) times daily.      Marland Kitchen HYDROCODONE-ACETAMINOPHEN 5-500 MG PO TABS Oral Take 1 tablet by mouth 2 (two) times daily as needed. For pain 30 tablet 0  . LORATADINE 10 MG PO TABS Oral Take 10 mg by mouth daily as needed. For allergies.    Marland Kitchen MEMANTINE HCL 10 MG PO TABS Oral Take 10 mg by mouth 2 (two) times daily.      . ADULT MULTIVITAMIN  W/MINERALS CH Oral Take 1 tablet by mouth daily.    Marland Kitchen OMEPRAZOLE 20 MG PO CPDR Oral Take 20 mg by mouth daily with breakfast.    . OXYBUTYNIN CHLORIDE 5 MG PO TABS Oral Take 5 mg by mouth 3 (three) times daily.      Marland Kitchen POTASSIUM CHLORIDE CRYS ER 20 MEQ PO TBCR Oral Take 20 mEq by mouth daily.     Marland Kitchen SIMVASTATIN 40 MG PO TABS Oral Take 40 mg by mouth at bedtime.      . SUCRALFATE 1 G PO TABS Oral Take 1 g by mouth 2 (two) times daily.      Marland Kitchen VALSARTAN 320 MG PO TABS Oral Take 320 mg by mouth daily.    Marland Kitchen CIPROFLOXACIN HCL 250 MG PO TABS Oral Take 1 tablet (250 mg total) by mouth every 12 (twelve) hours. 14 tablet 0    BP 101/63  Pulse 88  Temp 98.7 F (37.1 C) (Oral)  Resp 16  Ht 5\' 5"  (1.651 m)  Wt 220 lb (99.791 kg)  BMI 36.61 kg/m2  SpO2 98%  Physical Exam  Nursing note and vitals reviewed. Constitutional: She is oriented to person, place, and time. She appears well-developed and well-nourished. No  distress.  HENT:  Head: Normocephalic and atraumatic.  Mouth/Throat: Oropharynx is clear and moist.  Eyes: EOM are normal. Pupils are equal, round, and reactive to light.  Neck: Normal range of motion. Neck supple.  Cardiovascular: Normal rate and regular rhythm.   Pulmonary/Chest: Effort normal and breath sounds normal. No respiratory distress. She has no wheezes. She has no rales.  Abdominal: Soft. Bowel sounds are normal. There is no tenderness. There is no rebound and no guarding.  Musculoskeletal: She exhibits edema and tenderness.       Pt with R wrist swelling and warmth. Diffuse bl hand and L wrist tenderness. Diffuse bl feet tenderness with mild swelling. neurovasc intact in all ext.   Neurological: She is alert and oriented to person, place, and time.       Difficult to assess due to pain but no focal deficits  Skin: Skin is warm and dry. No rash noted. There is erythema.  Psychiatric: She has a normal mood and affect. Her behavior is normal.    ED Course  Procedures (including critical care time)  Labs Reviewed  CBC WITH DIFFERENTIAL - Abnormal; Notable for the following:    Hemoglobin 11.2 (*)     HCT 35.5 (*)     RDW 15.7 (*)     All other components within normal limits  BASIC METABOLIC PANEL - Abnormal; Notable for the following:    Glucose, Bld 128 (*)     BUN 25 (*)     Creatinine, Ser 1.39 (*)     GFR calc non Af Amer 37 (*)     GFR calc Af Amer 43 (*)     All other components within normal limits  URINALYSIS, ROUTINE W REFLEX MICROSCOPIC - Abnormal; Notable for the following:    APPearance CLOUDY (*)     Leukocytes, UA LARGE (*)     All other components within normal limits  URINE MICROSCOPIC-ADD ON - Abnormal; Notable for the following:    Squamous Epithelial / LPF FEW (*)     Bacteria, UA MANY (*)     All other components within normal limits   No results found.   1. UTI (urinary tract infection)   2. Gout  MDM  Gout flare. Will treat  symptomatically. Do not suspect septic joints. Will check urine. Likely d/c home to f/u with PMD        Loren Racer, MD 09/17/12 1332

## 2012-09-17 NOTE — ED Notes (Signed)
Pt stated that she clean her whole house last wk and after that she" went down hill daily pain increase

## 2012-10-05 ENCOUNTER — Encounter (HOSPITAL_COMMUNITY): Payer: Self-pay | Admitting: *Deleted

## 2012-10-05 ENCOUNTER — Emergency Department (HOSPITAL_COMMUNITY)
Admission: EM | Admit: 2012-10-05 | Discharge: 2012-10-06 | Disposition: A | Discharge status: Home / Self Care | Payer: PRIVATE HEALTH INSURANCE | Attending: Emergency Medicine | Admitting: Emergency Medicine

## 2012-10-05 DIAGNOSIS — N189 Chronic kidney disease, unspecified: Secondary | ICD-10-CM | POA: Insufficient documentation

## 2012-10-05 DIAGNOSIS — M129 Arthropathy, unspecified: Secondary | ICD-10-CM | POA: Insufficient documentation

## 2012-10-05 DIAGNOSIS — E669 Obesity, unspecified: Secondary | ICD-10-CM | POA: Insufficient documentation

## 2012-10-05 DIAGNOSIS — I129 Hypertensive chronic kidney disease with stage 1 through stage 4 chronic kidney disease, or unspecified chronic kidney disease: Secondary | ICD-10-CM | POA: Insufficient documentation

## 2012-10-05 DIAGNOSIS — E790 Hyperuricemia without signs of inflammatory arthritis and tophaceous disease: Secondary | ICD-10-CM

## 2012-10-05 DIAGNOSIS — R011 Cardiac murmur, unspecified: Secondary | ICD-10-CM | POA: Insufficient documentation

## 2012-10-05 DIAGNOSIS — E039 Hypothyroidism, unspecified: Secondary | ICD-10-CM | POA: Insufficient documentation

## 2012-10-05 DIAGNOSIS — Z87891 Personal history of nicotine dependence: Secondary | ICD-10-CM | POA: Insufficient documentation

## 2012-10-05 DIAGNOSIS — R7989 Other specified abnormal findings of blood chemistry: Secondary | ICD-10-CM | POA: Insufficient documentation

## 2012-10-05 DIAGNOSIS — M1990 Unspecified osteoarthritis, unspecified site: Secondary | ICD-10-CM

## 2012-10-05 DIAGNOSIS — M79609 Pain in unspecified limb: Secondary | ICD-10-CM | POA: Insufficient documentation

## 2012-10-05 LAB — CBC WITH DIFFERENTIAL/PLATELET
Basophils Relative: 1 % (ref 0–1)
Eosinophils Absolute: 0.5 10*3/uL (ref 0.0–0.7)
Eosinophils Relative: 7 % — ABNORMAL HIGH (ref 0–5)
Lymphs Abs: 1.7 10*3/uL (ref 0.7–4.0)
MCH: 28.3 pg (ref 26.0–34.0)
MCHC: 31.6 g/dL (ref 30.0–36.0)
MCV: 89.3 fL (ref 78.0–100.0)
Neutrophils Relative %: 65 % (ref 43–77)
Platelets: 314 10*3/uL (ref 150–400)
RDW: 14.8 % (ref 11.5–15.5)

## 2012-10-05 LAB — COMPREHENSIVE METABOLIC PANEL
ALT: 32 U/L (ref 0–35)
Albumin: 3.3 g/dL — ABNORMAL LOW (ref 3.5–5.2)
Alkaline Phosphatase: 109 U/L (ref 39–117)
Calcium: 10.2 mg/dL (ref 8.4–10.5)
GFR calc Af Amer: 42 mL/min — ABNORMAL LOW (ref >90)
Glucose, Bld: 87 mg/dL (ref 70–99)
Potassium: 3.8 mEq/L (ref 3.5–5.1)
Sodium: 139 mEq/L (ref 135–145)
Total Protein: 6.7 g/dL (ref 6.0–8.3)

## 2012-10-05 LAB — URIC ACID: Uric Acid, Serum: 14.2 mg/dL — ABNORMAL HIGH (ref 2.4–7.0)

## 2012-10-05 MED ORDER — MORPHINE SULFATE 4 MG/ML IJ SOLN
4.0000 mg | Freq: Once | INTRAMUSCULAR | Status: AC
  Administered 2012-10-05: 4 mg via INTRAVENOUS
  Filled 2012-10-05: qty 1

## 2012-10-05 MED ORDER — ONDANSETRON HCL 4 MG/2ML IJ SOLN
4.0000 mg | Freq: Once | INTRAMUSCULAR | Status: AC
  Administered 2012-10-05: 4 mg via INTRAVENOUS
  Filled 2012-10-05: qty 2

## 2012-10-05 NOTE — ED Notes (Signed)
Pt has complaints of joint pain and bodyaches,  That started 3 days ago   She has history of gout and arthitis

## 2012-10-05 NOTE — ED Provider Notes (Signed)
CSN: [id number]  Arrival date & time 10/05/12  1944   First MD Initiated Contact with Patient 10/05/12 2048      Chief Complaint  Patient presents with  . Joint Pain    (Consider location/radiation/quality/duration/timing/severity/associated sxs/prior treatment) HPI  Patient reports she has a history of ? rheumatoid arthritis and gout. She reports since Tuesday, 4 days ago she is having pain mainly in her hands and in her feet. She states she is unable to use her hands at all. She also states she's unable to walk she has to drag her feet while on her walker. She states she's had this before and thinks she may have rheumatoid arthritis. She denies any fever. Patient states she lives alone and doesn't have anybody to help take care of herself. She states she is taking her pain medication.  PCP Dr. Della Goo  Past Medical History  Diagnosis Date  . Arthritis   . Hypertension   . Gout   . Obesity   . Angina   . Heart murmur   . Chronic kidney disease   . Stroke   . Recurrent upper respiratory infection (URI)   . Hypothyroidism   . Blood transfusion   . Headache   . Anxiety   . Dysrhythmia   . Anemia   . CAP (community acquired pneumonia)     Past Surgical History  Procedure Date  . Uterine fibroid surgery     Family History  Problem Relation Age of Onset  . Asthma Mother   . Heart attack Father     History  Substance Use Topics  . Smoking status: Former Smoker -- 0.2 packs/day    Types: Cigarettes    Quit date: 03/13/2012  . Smokeless tobacco: Not on file  . Alcohol Use: No  Lives at home Lives alone Uses a cane/walker  OB History    Grav Para Term Preterm Abortions TAB SAB Ect Mult Living                  Review of Systems  All other systems reviewed and are negative.    Allergies  Sulfa antibiotics  Home Medications   Current Outpatient Rx  Name Route Sig Dispense Refill  . ALBUTEROL SULFATE HFA 108 (90 BASE) MCG/ACT IN AERS  Inhalation Inhale 2 puffs into the lungs every 6 (six) hours as needed. For shortness of breath     . ALLOPURINOL 300 MG PO TABS Oral Take 300 mg by mouth 3 (three) times daily.     Marland Kitchen CARVEDILOL 3.125 MG PO TABS Oral Take 3.125 mg by mouth 2 (two) times daily with a meal.     . CIPROFLOXACIN HCL 250 MG PO TABS Oral Take 250 mg by mouth every 12 (twelve) hours.    Marland Kitchen CLORAZEPATE DIPOTASSIUM 7.5 MG PO TABS Oral Take 7.5 mg by mouth 3 (three) times daily.    . COLCHICINE 0.6 MG PO TABS Oral Take 0.6 mg by mouth daily.    . DONEPEZIL HCL 10 MG PO TABS Oral Take 10 mg by mouth at bedtime.     . FUROSEMIDE 40 MG PO TABS Oral Take 40 mg by mouth 2 (two) times daily.     Marland Kitchen HYDROCODONE-ACETAMINOPHEN 5-325 MG PO TABS Oral Take 1 tablet by mouth every 6 (six) hours as needed. Pain    . LORATADINE 10 MG PO TABS Oral Take 10 mg by mouth daily as needed. For allergies.    Marland Kitchen MEMANTINE HCL  10 MG PO TABS Oral Take 10 mg by mouth 2 (two) times daily.     . ADULT MULTIVITAMIN W/MINERALS CH Oral Take 1 tablet by mouth daily.    Marland Kitchen OMEPRAZOLE 20 MG PO CPDR Oral Take 20 mg by mouth daily with breakfast.    . OXYBUTYNIN CHLORIDE 5 MG PO TABS Oral Take 5 mg by mouth 3 (three) times daily.      Marland Kitchen POTASSIUM CHLORIDE CRYS ER 20 MEQ PO TBCR Oral Take 20 mEq by mouth daily.     Marland Kitchen SIMVASTATIN 40 MG PO TABS Oral Take 40 mg by mouth at bedtime.      . SUCRALFATE 1 G PO TABS Oral Take 1 g by mouth 2 (two) times daily.     Marland Kitchen VALSARTAN 320 MG PO TABS Oral Take 320 mg by mouth daily.      BP 104/76  Pulse 110  Temp 98.3 F (36.8 C) (Oral)  Resp 20  SpO2 98%  Vital signs normal except tachycardia   Physical Exam  Nursing note and vitals reviewed. Constitutional: She is oriented to person, place, and time. She appears well-developed and well-nourished.  Non-toxic appearance. She does not appear ill. No distress.  HENT:  Head: Normocephalic and atraumatic.  Right Ear: External ear normal.  Left Ear: External ear normal.    Nose: Nose normal. No mucosal edema or rhinorrhea.  Mouth/Throat: Oropharynx is clear and moist and mucous membranes are normal. No dental abscesses or uvula swelling.  Eyes: Conjunctivae normal and EOM are normal. Pupils are equal, round, and reactive to light.  Neck: Normal range of motion and full passive range of motion without pain. Neck supple.  Cardiovascular: Normal rate, regular rhythm and normal heart sounds.  Exam reveals no gallop and no friction rub.   No murmur heard. Pulmonary/Chest: Effort normal and breath sounds normal. No respiratory distress. She has no wheezes. She has no rhonchi. She has no rales. She exhibits no tenderness and no crepitus.  Abdominal: Soft. Normal appearance and bowel sounds are normal. She exhibits no distension. There is no tenderness. There is no rebound and no guarding.  Musculoskeletal: Normal range of motion. She exhibits no edema and no tenderness.       Patient does not have any swelling in her elbows or her knees. She does have some mild swelling of her ankles with tenderness and in her feet. She also is noted to have diffuse swelling of the middle fingers of both hands and she has a deformity of the PIP joint of both hands but does not appear to be painful to palpation. She also states she has some tenderness in both wrists with some mild diffuse swelling  Neurological: She is alert and oriented to person, place, and time. She has normal strength. No cranial nerve deficit.  Skin: Skin is warm, dry and intact. No rash noted. No erythema. No pallor.  Psychiatric: She has a normal mood and affect. Her speech is normal and behavior is normal. Her mood appears not anxious.    ED Course  Procedures (including critical care time)   Medications  morphine 4 MG/ML injection 4 mg (4 mg Intravenous Given 10/05/12 2209)  ondansetron (ZOFRAN) injection 4 mg (4 mg Intravenous Given 10/05/12 2209)    Review of [place] controlled substance site shows  patient is getting hydrocodone on a regular basis from Dr. Lovell Sheehan and Dr. Sharyn Lull until the last prescription on 07/11/2012  Patient noted to have some episodes of hypotension  while sleeping with her blood pressure dropping to 86 systolic. However when she is awake and her blood pressure improves.  I have discussed with patient several times whether she thought she could go home. She seems to feel that since she doesn't have anyone to come pick her up she should be admitted to the hospital until the morning when she can get someone to pick her up. I have advised her we cannot admit her to the hospital just because she doesn't have a ride home. She can take an ambulance home but she may have to pay for it. I also offered to admit her for possible nursing home placement as she feels she is unable to take care of herself at home which she is not interested in.  Results for orders placed during the hospital encounter of 10/05/12  URIC ACID      Component Value Range   Uric Acid, Serum 14.2 (*) 2.4 - 7.0 mg/dL  SEDIMENTATION RATE      Component Value Range   Sed Rate 57 (*) 0 - 22 mm/hr  CBC WITH DIFFERENTIAL      Component Value Range   WBC 8.1  4.0 - 10.5 K/uL   RBC 3.75 (*) 3.87 - 5.11 MIL/uL   Hemoglobin 10.6 (*) 12.0 - 15.0 g/dL   HCT 09.8 (*) 11.9 - 14.7 %   MCV 89.3  78.0 - 100.0 fL   MCH 28.3  26.0 - 34.0 pg   MCHC 31.6  30.0 - 36.0 g/dL   RDW 82.9  56.2 - 13.0 %   Platelets 314  150 - 400 K/uL   Neutrophils Relative 65  43 - 77 %   Neutro Abs 5.3  1.7 - 7.7 K/uL   Lymphocytes Relative 21  12 - 46 %   Lymphs Abs 1.7  0.7 - 4.0 K/uL   Monocytes Relative 7  3 - 12 %   Monocytes Absolute 0.6  0.1 - 1.0 K/uL   Eosinophils Relative 7 (*) 0 - 5 %   Eosinophils Absolute 0.5  0.0 - 0.7 K/uL   Basophils Relative 1  0 - 1 %   Basophils Absolute 0.0  0.0 - 0.1 K/uL  COMPREHENSIVE METABOLIC PANEL      Component Value Range   Sodium 139  135 - 145 mEq/L   Potassium 3.8  3.5 - 5.1 mEq/L    Chloride 105  96 - 112 mEq/L   CO2 23  19 - 32 mEq/L   Glucose, Bld 87  70 - 99 mg/dL   BUN 26 (*) 6 - 23 mg/dL   Creatinine, Ser 8.65 (*) 0.50 - 1.10 mg/dL   Calcium 78.4  8.4 - 69.6 mg/dL   Total Protein 6.7  6.0 - 8.3 g/dL   Albumin 3.3 (*) 3.5 - 5.2 g/dL   AST 27  0 - 37 U/L   ALT 32  0 - 35 U/L   Alkaline Phosphatase 109  39 - 117 U/L   Total Bilirubin 0.2 (*) 0.3 - 1.2 mg/dL   GFR calc non Af Amer 36 (*) >90 mL/min   GFR calc Af Amer 42 (*) >90 mL/min   Laboratory interpretation all normal except very elevated uric acid, renal insufficiency, mild anemia      1. Arthritis   2. Hyperuricemia    New Prescriptions   HYDROCODONE-ACETAMINOPHEN (NORCO/VICODIN) 5-325 MG PER TABLET    Take 1 or 2 po Q 6hrs for pain    Plan discharge  Devoria Albe, MD, FACEP    MDM          Ward Givens, MD 10/06/12 [phone]

## 2012-10-06 MED ORDER — HYDROCODONE-ACETAMINOPHEN 5-325 MG PO TABS: ORAL_TABLET | ORAL | Status: DC

## 2012-10-06 NOTE — ED Notes (Signed)
Pt dtr returned call.  To come and take pt home.

## 2012-10-06 NOTE — ED Notes (Signed)
Attempted to reach family for pt ride home. Left messages.

## 2012-10-07 ENCOUNTER — Encounter (HOSPITAL_COMMUNITY): Payer: Self-pay

## 2012-10-07 ENCOUNTER — Observation Stay (HOSPITAL_COMMUNITY): Payer: PRIVATE HEALTH INSURANCE

## 2012-10-07 ENCOUNTER — Other Ambulatory Visit: Payer: Self-pay

## 2012-10-07 ENCOUNTER — Emergency Department (HOSPITAL_COMMUNITY): Payer: PRIVATE HEALTH INSURANCE

## 2012-10-07 ENCOUNTER — Observation Stay (HOSPITAL_COMMUNITY)
Admission: EM | Admit: 2012-10-07 | Discharge: 2012-10-08 | Disposition: A | Discharge status: Home / Self Care | Payer: PRIVATE HEALTH INSURANCE | Attending: Internal Medicine | Admitting: Internal Medicine

## 2012-10-07 DIAGNOSIS — R079 Chest pain, unspecified: Secondary | ICD-10-CM

## 2012-10-07 DIAGNOSIS — E669 Obesity, unspecified: Secondary | ICD-10-CM

## 2012-10-07 DIAGNOSIS — F419 Anxiety disorder, unspecified: Secondary | ICD-10-CM

## 2012-10-07 DIAGNOSIS — I959 Hypotension, unspecified: Secondary | ICD-10-CM | POA: Insufficient documentation

## 2012-10-07 DIAGNOSIS — Z87891 Personal history of nicotine dependence: Secondary | ICD-10-CM | POA: Insufficient documentation

## 2012-10-07 DIAGNOSIS — E875 Hyperkalemia: Secondary | ICD-10-CM

## 2012-10-07 DIAGNOSIS — Z8673 Personal history of transient ischemic attack (TIA), and cerebral infarction without residual deficits: Secondary | ICD-10-CM | POA: Insufficient documentation

## 2012-10-07 DIAGNOSIS — G8929 Other chronic pain: Secondary | ICD-10-CM

## 2012-10-07 DIAGNOSIS — R600 Localized edema: Secondary | ICD-10-CM | POA: Diagnosis present

## 2012-10-07 DIAGNOSIS — E039 Hypothyroidism, unspecified: Secondary | ICD-10-CM | POA: Insufficient documentation

## 2012-10-07 DIAGNOSIS — R609 Edema, unspecified: Principal | ICD-10-CM | POA: Insufficient documentation

## 2012-10-07 DIAGNOSIS — N184 Chronic kidney disease, stage 4 (severe): Secondary | ICD-10-CM

## 2012-10-07 DIAGNOSIS — E860 Dehydration: Secondary | ICD-10-CM

## 2012-10-07 DIAGNOSIS — D649 Anemia, unspecified: Secondary | ICD-10-CM

## 2012-10-07 DIAGNOSIS — J189 Pneumonia, unspecified organism: Secondary | ICD-10-CM

## 2012-10-07 DIAGNOSIS — G309 Alzheimer's disease, unspecified: Secondary | ICD-10-CM | POA: Insufficient documentation

## 2012-10-07 DIAGNOSIS — M25529 Pain in unspecified elbow: Secondary | ICD-10-CM

## 2012-10-07 DIAGNOSIS — I129 Hypertensive chronic kidney disease with stage 1 through stage 4 chronic kidney disease, or unspecified chronic kidney disease: Secondary | ICD-10-CM | POA: Insufficient documentation

## 2012-10-07 DIAGNOSIS — R269 Unspecified abnormalities of gait and mobility: Secondary | ICD-10-CM | POA: Insufficient documentation

## 2012-10-07 DIAGNOSIS — M129 Arthropathy, unspecified: Secondary | ICD-10-CM | POA: Insufficient documentation

## 2012-10-07 DIAGNOSIS — M109 Gout, unspecified: Secondary | ICD-10-CM | POA: Insufficient documentation

## 2012-10-07 DIAGNOSIS — Z79899 Other long term (current) drug therapy: Secondary | ICD-10-CM | POA: Insufficient documentation

## 2012-10-07 DIAGNOSIS — M25559 Pain in unspecified hip: Secondary | ICD-10-CM

## 2012-10-07 DIAGNOSIS — F0280 Dementia in other diseases classified elsewhere without behavioral disturbance: Secondary | ICD-10-CM | POA: Insufficient documentation

## 2012-10-07 DIAGNOSIS — I10 Essential (primary) hypertension: Secondary | ICD-10-CM

## 2012-10-07 HISTORY — DX: Dementia in other diseases classified elsewhere, unspecified severity, without behavioral disturbance, psychotic disturbance, mood disturbance, and anxiety: F02.80

## 2012-10-07 HISTORY — DX: Unspecified dementia, unspecified severity, without behavioral disturbance, psychotic disturbance, mood disturbance, and anxiety: F03.90

## 2012-10-07 HISTORY — DX: Alzheimer's disease, unspecified: G30.9

## 2012-10-07 LAB — CBC WITH DIFFERENTIAL/PLATELET
Eosinophils Absolute: 0.2 10*3/uL (ref 0.0–0.7)
Eosinophils Relative: 3 % (ref 0–5)
HCT: 33.8 % — ABNORMAL LOW (ref 36.0–46.0)
Lymphocytes Relative: 12 % (ref 12–46)
Lymphs Abs: 1.1 10*3/uL (ref 0.7–4.0)
MCH: 27.9 pg (ref 26.0–34.0)
MCV: 89.7 fL (ref 78.0–100.0)
Monocytes Absolute: 0.4 10*3/uL (ref 0.1–1.0)
Platelets: 352 10*3/uL (ref 150–400)
RBC: 3.77 MIL/uL — ABNORMAL LOW (ref 3.87–5.11)
WBC: 9.3 10*3/uL (ref 4.0–10.5)

## 2012-10-07 LAB — URINALYSIS, ROUTINE W REFLEX MICROSCOPIC
Bilirubin Urine: NEGATIVE
Glucose, UA: NEGATIVE mg/dL
Hgb urine dipstick: NEGATIVE
Protein, ur: NEGATIVE mg/dL
Specific Gravity, Urine: 1.019 (ref 1.005–1.030)
Urobilinogen, UA: 0.2 mg/dL (ref 0.0–1.0)

## 2012-10-07 LAB — COMPREHENSIVE METABOLIC PANEL
ALT: 24 U/L (ref 0–35)
BUN: 30 mg/dL — ABNORMAL HIGH (ref 6–23)
CO2: 24 mEq/L (ref 19–32)
Calcium: 10.7 mg/dL — ABNORMAL HIGH (ref 8.4–10.5)
Creatinine, Ser: 1.54 mg/dL — ABNORMAL HIGH (ref 0.50–1.10)
GFR calc Af Amer: 38 mL/min — ABNORMAL LOW (ref >90)
GFR calc non Af Amer: 33 mL/min — ABNORMAL LOW (ref >90)
Glucose, Bld: 116 mg/dL — ABNORMAL HIGH (ref 70–99)
Sodium: 139 mEq/L (ref 135–145)
Total Protein: 6.8 g/dL (ref 6.0–8.3)

## 2012-10-07 LAB — MAGNESIUM: Magnesium: 2.4 mg/dL (ref 1.5–2.5)

## 2012-10-07 LAB — BLOOD GAS, VENOUS
Acid-base deficit: 2.2 mmol/L — ABNORMAL HIGH (ref 0.0–2.0)
TCO2: 21.9 mmol/L (ref 0–100)
pCO2, Ven: 45 mmHg (ref 45.0–50.0)
pH, Ven: 7.331 — ABNORMAL HIGH (ref 7.250–7.300)
pO2, Ven: 57.7 mmHg — ABNORMAL HIGH (ref 30.0–45.0)

## 2012-10-07 LAB — URIC ACID: Uric Acid, Serum: 16.2 mg/dL — ABNORMAL HIGH (ref 2.4–7.0)

## 2012-10-07 LAB — TROPONIN I: Troponin I: <0.3 ng/mL (ref <0.30)

## 2012-10-07 MED ORDER — ACETAMINOPHEN 500 MG PO TABS: 500.0000 mg | ORAL_TABLET | Freq: Four times a day (QID) | ORAL | Status: DC | PRN

## 2012-10-07 MED ORDER — MEMANTINE HCL 10 MG PO TABS
10.0000 mg | ORAL_TABLET | Freq: Two times a day (BID) | ORAL | Status: DC
  Administered 2012-10-07 – 2012-10-08 (×2): 10 mg via ORAL
  Filled 2012-10-07 (×3): qty 1

## 2012-10-07 MED ORDER — SODIUM CHLORIDE 0.9 % IV SOLN: 250.0000 mL | INTRAVENOUS | Status: DC | PRN

## 2012-10-07 MED ORDER — SODIUM CHLORIDE 0.9 % IV SOLN: Freq: Once | INTRAVENOUS | Status: DC

## 2012-10-07 MED ORDER — FUROSEMIDE 40 MG PO TABS
40.0000 mg | ORAL_TABLET | Freq: Two times a day (BID) | ORAL | Status: DC
  Administered 2012-10-07 – 2012-10-08 (×2): 40 mg via ORAL
  Filled 2012-10-07 (×4): qty 1

## 2012-10-07 MED ORDER — CLORAZEPATE DIPOTASSIUM 7.5 MG PO TABS
7.5000 mg | ORAL_TABLET | Freq: Three times a day (TID) | ORAL | Status: DC
  Administered 2012-10-07 – 2012-10-08 (×3): 7.5 mg via ORAL
  Filled 2012-10-07 (×3): qty 1

## 2012-10-07 MED ORDER — COLCHICINE 0.6 MG PO TABS
0.6000 mg | ORAL_TABLET | Freq: Every day | ORAL | Status: DC
  Administered 2012-10-08: 0.6 mg via ORAL
  Filled 2012-10-07: qty 1

## 2012-10-07 MED ORDER — ADULT MULTIVITAMIN W/MINERALS CH
1.0000 | ORAL_TABLET | Freq: Every day | ORAL | Status: DC
  Administered 2012-10-08: 1 via ORAL
  Filled 2012-10-07: qty 1

## 2012-10-07 MED ORDER — HYDROCODONE-ACETAMINOPHEN 5-325 MG PO TABS
1.0000 | ORAL_TABLET | Freq: Four times a day (QID) | ORAL | Status: DC | PRN
  Administered 2012-10-08 (×2): 1 via ORAL
  Filled 2012-10-07 (×2): qty 1

## 2012-10-07 MED ORDER — KETOROLAC TROMETHAMINE 15 MG/ML IJ SOLN
15.0000 mg | Freq: Once | INTRAMUSCULAR | Status: AC
  Administered 2012-10-07: 15 mg via INTRAVENOUS
  Filled 2012-10-07: qty 1

## 2012-10-07 MED ORDER — LEVOFLOXACIN 250 MG PO TABS: 250.0000 mg | ORAL_TABLET | Freq: Every day | ORAL | Status: DC

## 2012-10-07 MED ORDER — LORATADINE 10 MG PO TABS
10.0000 mg | ORAL_TABLET | Freq: Every day | ORAL | Status: DC | PRN
  Filled 2012-10-07: qty 1

## 2012-10-07 MED ORDER — PREDNISONE 20 MG PO TABS
40.0000 mg | ORAL_TABLET | Freq: Every day | ORAL | Status: DC
  Administered 2012-10-08: 40 mg via ORAL
  Filled 2012-10-07 (×2): qty 2

## 2012-10-07 MED ORDER — SODIUM CHLORIDE 0.9 % IJ SOLN
3.0000 mL | Freq: Two times a day (BID) | INTRAMUSCULAR | Status: DC
  Administered 2012-10-08 (×2): 3 mL via INTRAVENOUS

## 2012-10-07 MED ORDER — HEPARIN SODIUM (PORCINE) 5000 UNIT/ML IJ SOLN
5000.0000 [IU] | Freq: Three times a day (TID) | INTRAMUSCULAR | Status: DC
  Administered 2012-10-07 – 2012-10-08 (×3): 5000 [IU] via SUBCUTANEOUS
  Filled 2012-10-07 (×5): qty 1

## 2012-10-07 MED ORDER — ALLOPURINOL 300 MG PO TABS
300.0000 mg | ORAL_TABLET | Freq: Three times a day (TID) | ORAL | Status: DC
Start: 2012-10-07 — End: 2012-10-08
  Administered 2012-10-07 – 2012-10-08 (×3): 300 mg via ORAL
  Filled 2012-10-07 (×4): qty 1

## 2012-10-07 MED ORDER — ALBUTEROL SULFATE HFA 108 (90 BASE) MCG/ACT IN AERS: 2.0000 | INHALATION_SPRAY | Freq: Four times a day (QID) | RESPIRATORY_TRACT | Status: DC | PRN

## 2012-10-07 MED ORDER — SODIUM CHLORIDE 0.9 % IV BOLUS (SEPSIS)
1000.0000 mL | Freq: Once | INTRAVENOUS | Status: AC
  Administered 2012-10-07: 1000 mL via INTRAVENOUS

## 2012-10-07 MED ORDER — OXYBUTYNIN CHLORIDE 5 MG PO TABS
5.0000 mg | ORAL_TABLET | Freq: Three times a day (TID) | ORAL | Status: DC
Start: 2012-10-07 — End: 2012-10-08
  Administered 2012-10-07 – 2012-10-08 (×3): 5 mg via ORAL
  Filled 2012-10-07 (×4): qty 1

## 2012-10-07 MED ORDER — PREDNISONE 20 MG PO TABS
60.0000 mg | ORAL_TABLET | Freq: Once | ORAL | Status: AC
  Administered 2012-10-07: 60 mg via ORAL
  Filled 2012-10-07: qty 6

## 2012-10-07 MED ORDER — SIMVASTATIN 40 MG PO TABS
40.0000 mg | ORAL_TABLET | Freq: Every day | ORAL | Status: DC
  Administered 2012-10-07: 40 mg via ORAL
  Filled 2012-10-07 (×2): qty 1

## 2012-10-07 MED ORDER — DONEPEZIL HCL 10 MG PO TABS
10.0000 mg | ORAL_TABLET | Freq: Every day | ORAL | Status: DC
  Administered 2012-10-07: 10 mg via ORAL
  Filled 2012-10-07 (×2): qty 1

## 2012-10-07 MED ORDER — SODIUM CHLORIDE 0.9 % IJ SOLN: 3.0000 mL | INTRAMUSCULAR | Status: DC | PRN

## 2012-10-07 MED ORDER — SUCRALFATE 1 G PO TABS
1.0000 g | ORAL_TABLET | Freq: Every day | ORAL | Status: DC
  Administered 2012-10-08: 1 g via ORAL
  Filled 2012-10-07: qty 1

## 2012-10-07 MED ORDER — PANTOPRAZOLE SODIUM 40 MG PO TBEC
40.0000 mg | DELAYED_RELEASE_TABLET | Freq: Every day | ORAL | Status: DC
  Administered 2012-10-08: 40 mg via ORAL
  Filled 2012-10-07: qty 1

## 2012-10-07 NOTE — Progress Notes (Signed)
[facility] ED CM consulted by EDP, Rhunette Croft, about how to assist pt to get to snf from community.  Discussed that EDP can initiate home health orders for [facility] & [facility] to aid Dr Mort Sawyers in getting to pt to snf from community. CM offered to assist EDP with this process Reviewed EPIC notes, labs and imaging

## 2012-10-07 NOTE — H&P (Signed)
[facility] History and Physical  Katherine Cooper GNF:[id number] DOB: February 12, 1941 DOA: 10/07/2012  Referring physician: Rhunette Croft, ED PCP: Ron Parker, MD  Specialists: None  Chief Complaint: Gout  HPI: Katherine Cooper is a 71 y.o. female who presents to the ED for the 3rd time this week with c/o gout attack.  Of note she had a previous gout attack about 2 weeks ago and then previous to that in July of this year.  Her pain is located in B hands, wrists, and feet, and is typical of her gout attacks.  She has also been having swelling in her legs worse in the past week because she admits she hasnt been taking her lasix for the past week (lasix makes her go to the bathroom she says and it hurts so much to get up to walk to the bathroom).  She states she has been taking her other meds as prescribed.  In the ED the patient refused / was unable to ambulate, was found to have a uric acid of 16.2, hospitalist service has been asked to admit the patient for pain control.  Review of Systems: Patient denies fever, but states she is always chilly, 12 systems reviewed and otherwise negative.  Past Medical History  Diagnosis Date  . Arthritis   . Hypertension   . Gout   . Obesity   . Angina   . Heart murmur   . Chronic kidney disease   . Stroke   . Recurrent upper respiratory infection (URI)   . Hypothyroidism   . Blood transfusion   . Headache   . Anxiety   . Dysrhythmia   . Anemia   . CAP (community acquired pneumonia)   . Alzheimer disease   . Dementia    Past Surgical History  Procedure Date  . Uterine fibroid surgery    Social History:  reports that she quit smoking about 6 months ago. Her smoking use included Cigarettes. She smoked .25 packs per day. She does not have any smokeless tobacco history on file. She reports that she does not drink alcohol or use illicit drugs. Patient lives at home, not really performing many ADLs at this point in time, the possible need for an ALF is  playing a role in this admission.  Allergies  Allergen Reactions  . Sulfa Antibiotics Itching    Family History  Problem Relation Age of Onset  . Asthma Mother   . Heart attack Father      Prior to Admission medications   Medication Sig Start Date End Date Taking? Authorizing Provider  acetaminophen (TYLENOL) 500 MG tablet Take 500 mg by mouth every 6 (six) hours as needed. pain   Yes Historical Provider, MD  albuterol (PROVENTIL HFA;VENTOLIN HFA) 108 (90 BASE) MCG/ACT inhaler Inhale 2 puffs into the lungs every 6 (six) hours as needed. For shortness of breath    Yes Historical Provider, MD  allopurinol (ZYLOPRIM) 300 MG tablet Take 300 mg by mouth 3 (three) times daily.    Yes Historical Provider, MD  carvedilol (COREG) 3.125 MG tablet Take 3.125 mg by mouth 2 (two) times daily with a meal.    Yes Historical Provider, MD  ciprofloxacin (CIPRO) 250 MG tablet Take 250 mg by mouth every 12 (twelve) hours. 09/17/12  Yes Loren Racer, MD  clorazepate (TRANXENE) 7.5 MG tablet Take 7.5 mg by mouth 3 (three) times daily.   Yes Historical Provider, MD  colchicine 0.6 MG tablet Take 0.6 mg by mouth daily.  Yes Historical Provider, MD  donepezil (ARICEPT) 10 MG tablet Take 10 mg by mouth at bedtime.    Yes Historical Provider, MD  furosemide (LASIX) 40 MG tablet Take 40 mg by mouth 2 (two) times daily.    Yes Historical Provider, MD  HYDROcodone-acetaminophen (NORCO/VICODIN) 5-325 MG per tablet Take 1 tablet by mouth every 6 (six) hours as needed. Pain   Yes Historical Provider, MD  loratadine (CLARITIN) 10 MG tablet Take 10 mg by mouth daily as needed. For allergies.   Yes Historical Provider, MD  memantine (NAMENDA) 10 MG tablet Take 10 mg by mouth 2 (two) times daily.    Yes Historical Provider, MD  Multiple Vitamin (MULTIVITAMIN WITH MINERALS) TABS Take 1 tablet by mouth daily.   Yes Historical Provider, MD  omeprazole (PRILOSEC) 20 MG capsule Take 20 mg by mouth daily with breakfast.   Yes  Historical Provider, MD  oxybutynin (DITROPAN) 5 MG tablet Take 5 mg by mouth 3 (three) times daily.     Yes Historical Provider, MD  potassium chloride SA (K-DUR,KLOR-CON) 20 MEQ tablet Take 20 mEq by mouth daily.    Yes Historical Provider, MD  simvastatin (ZOCOR) 40 MG tablet Take 40 mg by mouth at bedtime.     Yes Historical Provider, MD  sucralfate (CARAFATE) 1 G tablet Take 1 g by mouth daily.    Yes Historical Provider, MD  valsartan (DIOVAN) 320 MG tablet Take 320 mg by mouth daily.   Yes Historical Provider, MD  levofloxacin (LEVAQUIN) 250 MG tablet Take 1 tablet (250 mg total) by mouth daily. 10/07/12   Derwood Kaplan, MD   Physical Exam: Filed Vitals:   10/07/12 1454 10/07/12 1505  BP: 100/80 92/69  Pulse:  111  Temp:  98.8 F (37.1 C)  TempSrc:  Oral  Resp:  20  SpO2:  96%     General:  NAD, resting comfortably in hospital bed  Eyes: PEERLA EOMI  ENT: mucous membranes moist  Neck: supple w/o JVD  Cardiovascular: tachycardic, regular rhythm w/o MRG  Respiratory: CTA B  Abdomen: soft, nt, obese, bs+  Skin: without rash or lesion  Musculoskeletal: joint effusions present in B wrists and B ankles, few tophi apparent, no erythema on any joint  Psychiatric: normal tone and affect  Neurologic: grossly non-focal, AAOx3  Labs on Admission:  Basic Metabolic Panel:  Lab 10/07/12 1610 10/05/12 2204  NA 139 139  K 4.4 3.8  CL 103 105  CO2 24 23  GLUCOSE 116* 87  BUN 30* 26*  CREATININE 1.54* 1.43*  CALCIUM 10.7* 10.2  MG 2.4 --  PHOS 3.8 --   Liver Function Tests:  Lab 10/07/12 1620 10/05/12 2204  AST 20 27  ALT 24 32  ALKPHOS 108 109  BILITOT 0.2* 0.2*  PROT 6.8 6.7  ALBUMIN 3.1* 3.3*   No results found for this basename: LIPASE:5,AMYLASE:5 in the last 168 hours No results found for this basename: AMMONIA:5 in the last 168 hours CBC:  Lab 10/07/12 1620 10/05/12 2204  WBC 9.3 8.1  NEUTROABS 7.5 5.3  HGB 10.5* 10.6*  HCT 33.8* 33.5*  MCV 89.7  89.3  PLT 352 314   Cardiac Enzymes:  Lab 10/07/12 1620  CKTOTAL --  CKMB --  CKMBINDEX --  TROPONINI <0.30    BNP (last 3 results)  Basename 11/12/11 2141 10/16/11 2347  PROBNP 17.9 158.4*   CBG: No results found for this basename: GLUCAP:5 in the last 168 hours  Radiological Exams on Admission:  Dg Chest Port 1 View  10/07/2012  *RADIOLOGY REPORT*  Clinical Data: Gout  PORTABLE CHEST - 1 VIEW  Comparison: 07/21/2012.  Findings: The heart is enlarged but appears stable.  There is tortuosity and ectasia of the thoracic aorta.  The lungs are clear. No pleural effusion.  The bony thorax is intact.  IMPRESSION: Stable cardiac enlargement and tortuous aorta but no acute pulmonary findings.   Original Report Authenticated By: P. Loralie Champagne, M.D.     EKG: Independently reviewed.  Assessment/Plan Active Problems:  Bilateral leg edema  Gout  HTN (hypertension)  CKD (chronic kidney disease) stage 4, GFR 15-29 ml/min  Hypotension   1. Gout - will admit, put on prednisone 40mg  po daily for now, likely will need taper, will leave on home gout meds although with a uric acid of 16.2 question if she dosent need more in the way of long term uric acid agents vs compliance with meds in the long run (obviously dont want to mobilize more uric acid right now so will not start new med acutely). 2. HTN - Patient actually hypotensive in ED - will hold HTN meds except for lasix. 3. BLE edema - will restart home lasix as inpatient, saline lock fluids and put patient on diet. 4. Hypotension in ED - see #2 5. Question baseline ability to care for self - will order PT/OT consult. 6. CKD stage 4 - actually appears to be only stage 3 at baseline given today's and recent labs with GFRs fairly consistently calculated to be above 30.  Recheck BMP in AM. 7. Chronic Anemia - chronic and stable  Code Status: Full Code Family Communication: Spoke with 2 daughters in room Disposition Plan: Admit to  obs  Time spent: 70 min  Berel Najjar M. [facility] Pager [phone]  If 7PM-7AM, please contact night-coverage [email or web address] Password [facility] 10/07/2012, 7:44 PM

## 2012-10-07 NOTE — ED Notes (Signed)
[id number]<AV> Expected date:<BR> Expected time:<BR> Means of arrival:<BR> Comments:<BR> grout

## 2012-10-07 NOTE — ED Provider Notes (Addendum)
History     CSN: [id number]  Arrival date & time 10/07/12  1454   First MD Initiated Contact with Patient 10/07/12 1524      Chief Complaint  Patient presents with  . Gout    (Consider location/radiation/quality/duration/timing/severity/associated sxs/prior treatment) HPI Comments: Pt with hx of UTI, Gout comes in with cc of gout. Pt has been having gout related pain x 1 week now, and has had 2 ER visits for this complain already. Pt reports that her pain has worsened since this Saturday, and is located in bilateral hands, wrist and feet. Pt is unable to walk now, she lives by herself. Family went to check on her the past few days, and report that she was noted stooped down on a chair both times, and had to be helped to the bed. They called PCP office, but were requested to come to the ER. They are requesting that patient get placed in a rehab.  The history is provided by the patient, a relative and medical records.    Past Medical History  Diagnosis Date  . Arthritis   . Hypertension   . Gout   . Obesity   . Angina   . Heart murmur   . Chronic kidney disease   . Stroke   . Recurrent upper respiratory infection (URI)   . Hypothyroidism   . Blood transfusion   . Headache   . Anxiety   . Dysrhythmia   . Anemia   . CAP (community acquired pneumonia)   . Alzheimer disease   . Dementia     Past Surgical History  Procedure Date  . Uterine fibroid surgery     Family History  Problem Relation Age of Onset  . Asthma Mother   . Heart attack Father     History  Substance Use Topics  . Smoking status: Former Smoker -- 0.2 packs/day    Types: Cigarettes    Quit date: 03/13/2012  . Smokeless tobacco: Not on file  . Alcohol Use: No    OB History    Grav Para Term Preterm Abortions TAB SAB Ect Mult Living                  Review of Systems  Constitutional: Positive for activity change, appetite change and fatigue. Negative for fever.  HENT: Negative for  facial swelling and neck pain.   Respiratory: Negative for cough, shortness of breath and wheezing.   Cardiovascular: Negative for chest pain.  Gastrointestinal: Negative for nausea, vomiting, abdominal pain, diarrhea, constipation, blood in stool and abdominal distention.  Genitourinary: Negative for hematuria and difficulty urinating.  Musculoskeletal: Positive for myalgias, arthralgias and gait problem.  Skin: Negative for color change.  Neurological: Negative for speech difficulty.  Hematological: Does not bruise/bleed easily.  Psychiatric/Behavioral: Negative for confusion.    Allergies  Sulfa antibiotics  Home Medications   Current Outpatient Rx  Name Route Sig Dispense Refill  . ACETAMINOPHEN 500 MG PO TABS Oral Take 500 mg by mouth every 6 (six) hours as needed. pain    . ALBUTEROL SULFATE HFA 108 (90 BASE) MCG/ACT IN AERS Inhalation Inhale 2 puffs into the lungs every 6 (six) hours as needed. For shortness of breath     . ALLOPURINOL 300 MG PO TABS Oral Take 300 mg by mouth 3 (three) times daily.     Marland Kitchen CARVEDILOL 3.125 MG PO TABS Oral Take 3.125 mg by mouth 2 (two) times daily with a meal.     .  CIPROFLOXACIN HCL 250 MG PO TABS Oral Take 250 mg by mouth every 12 (twelve) hours.    Marland Kitchen CLORAZEPATE DIPOTASSIUM 7.5 MG PO TABS Oral Take 7.5 mg by mouth 3 (three) times daily.    . COLCHICINE 0.6 MG PO TABS Oral Take 0.6 mg by mouth daily.    . DONEPEZIL HCL 10 MG PO TABS Oral Take 10 mg by mouth at bedtime.     . FUROSEMIDE 40 MG PO TABS Oral Take 40 mg by mouth 2 (two) times daily.     Marland Kitchen HYDROCODONE-ACETAMINOPHEN 5-325 MG PO TABS Oral Take 1 tablet by mouth every 6 (six) hours as needed. Pain    . LORATADINE 10 MG PO TABS Oral Take 10 mg by mouth daily as needed. For allergies.    Marland Kitchen MEMANTINE HCL 10 MG PO TABS Oral Take 10 mg by mouth 2 (two) times daily.     . ADULT MULTIVITAMIN W/MINERALS CH Oral Take 1 tablet by mouth daily.    Marland Kitchen OMEPRAZOLE 20 MG PO CPDR Oral Take 20 mg by mouth  daily with breakfast.    . OXYBUTYNIN CHLORIDE 5 MG PO TABS Oral Take 5 mg by mouth 3 (three) times daily.      Marland Kitchen POTASSIUM CHLORIDE CRYS ER 20 MEQ PO TBCR Oral Take 20 mEq by mouth daily.     Marland Kitchen SIMVASTATIN 40 MG PO TABS Oral Take 40 mg by mouth at bedtime.      . SUCRALFATE 1 G PO TABS Oral Take 1 g by mouth daily.     Marland Kitchen VALSARTAN 320 MG PO TABS Oral Take 320 mg by mouth daily.      BP 92/69  Pulse 111  Temp 98.8 F (37.1 C) (Oral)  Resp 20  SpO2 96%  Physical Exam  Nursing note and vitals reviewed. Constitutional: She is oriented to person, place, and time. She appears well-developed and well-nourished.  HENT:  Head: Normocephalic and atraumatic.  Eyes: Conjunctivae normal and EOM are normal. Pupils are equal, round, and reactive to light.  Neck: Normal range of motion. Neck supple.  Cardiovascular: Regular rhythm and normal heart sounds.   No murmur heard.      tachycardia  Pulmonary/Chest: Effort normal and breath sounds normal. No respiratory distress.  Abdominal: Soft. Bowel sounds are normal. She exhibits no distension. There is no tenderness. There is no rebound and no guarding.  Musculoskeletal:       Bilateral hand and wrist swelling and tenderness, bilateral feet tenderness and swelling. Mild callor, no rubor.  Neurological: She is alert and oriented to person, place, and time.  Skin: Skin is warm and dry.    ED Course  Procedures (including critical care time)   Labs Reviewed  MAGNESIUM  PHOSPHORUS  URINALYSIS, ROUTINE W REFLEX MICROSCOPIC  CBC WITH DIFFERENTIAL  COMPREHENSIVE METABOLIC PANEL  TROPONIN I  URINE CULTURE  ACETAMINOPHEN LEVEL  LACTIC ACID, PLASMA  BLOOD GAS, VENOUS   No results found.   No diagnosis found.    MDM  Pt comes in with cc of Gout. This is her 3rd visit to the ED with the same complain in the week. She is steadily declining per family, unable to walk, and unable to take care of herself at this point. Exam reveals  diffuse tenderness. Pt is also tachycardic, with borderline BP. Will hydrate, and get basic labs and cardiac workup started.  Will consult case management to see if they can provide patient with options on nursing home stay.  We might consider obs admission.  Derwood Kaplan, MD 10/07/12 1609    Derwood Kaplan, MD 10/07/12 1613   Date: 10/07/2012  Rate: 104  Rhythm: sinus tachycardia  QRS Axis: left  Intervals: normal  ST/T Wave abnormalities: normal  Conduction Disutrbances: none  Narrative Interpretation: unremarkable      Derwood Kaplan, MD 10/07/12 1927

## 2012-10-07 NOTE — ED Notes (Signed)
Pt refused to walk. Dr. Rhunette Croft notified.

## 2012-10-07 NOTE — ED Notes (Signed)
Per EMS pt was seen here sat pt complain of bilat feet and hand pain due to gout. Pt lives at home alone and said if she is given the option will go to a skilled facility. Pt has been sitting on the couch since sat because she is unable to move due to pain

## 2012-10-07 NOTE — Progress Notes (Signed)
ED CM contacted EDP to see if Cm interventions needed EDP pending a return call from [facility], reports pt refusing to ambulate. CM informed EDP if further interventions needed CM needs to be contacted for processing home health referral

## 2012-10-07 NOTE — Progress Notes (Signed)
[facility] ED Cm informed by ED RN that Daughters returned to pt's room. Reviewed with daughters x 2 the process for community snf placement. Daughters voiced understanding and appreciation of services/resources offered They agreed pt would be ok at home with them until able to get to snf.  Burna Mortimer states she has a completed FL2 from 3 months ago and they had been attempting get pt in a snf without success. Encouraged them to review the list of snfs to assist the home health staff with choice of placement

## 2012-10-07 NOTE — Progress Notes (Signed)
ED Cm spoke with pt. Daughter "stepped out but will be back".  Informed her of consult from EDP.  Reviewed medicare guidelines, EDP evaluation and EDP wanting to consult [facility], Home health services availability to assist Dr Lovell Sheehan to help find snf placement from community.  Pt reports refusing snf placement during previous admission but states she agreeing to admission to snf assistance 10/07/12.  Reviewed how [facility] & [facility] assists in the community.  Provided lists of [place] snfs.  Pt states daughters Burna Mortimer and Marcelino Duster assists her at home.  Pt confirms if d/c home she will have primary caregiver until admission to snf. ED CM updated EDP, [place] of discussion with pt and her agreeing and choice to have [facility] as the home health agency to assist with [facility] & [facility]. Pending orders from EDP

## 2012-10-08 ENCOUNTER — Encounter (HOSPITAL_COMMUNITY): Payer: Self-pay

## 2012-10-08 DIAGNOSIS — M109 Gout, unspecified: Secondary | ICD-10-CM

## 2012-10-08 DIAGNOSIS — R609 Edema, unspecified: Secondary | ICD-10-CM

## 2012-10-08 DIAGNOSIS — G8929 Other chronic pain: Secondary | ICD-10-CM

## 2012-10-08 LAB — CBC
Hemoglobin: 9.3 g/dL — ABNORMAL LOW (ref 12.0–15.0)
MCH: 28.1 pg (ref 26.0–34.0)
MCHC: 31.1 g/dL (ref 30.0–36.0)
RDW: 14.8 % (ref 11.5–15.5)

## 2012-10-08 LAB — BASIC METABOLIC PANEL
BUN: 32 mg/dL — ABNORMAL HIGH (ref 6–23)
Calcium: 9.9 mg/dL (ref 8.4–10.5)
Creatinine, Ser: 1.53 mg/dL — ABNORMAL HIGH (ref 0.50–1.10)
GFR calc non Af Amer: 33 mL/min — ABNORMAL LOW (ref >90)
Glucose, Bld: 167 mg/dL — ABNORMAL HIGH (ref 70–99)
Potassium: 4.9 mEq/L (ref 3.5–5.1)

## 2012-10-08 MED ORDER — HYDROCODONE-ACETAMINOPHEN 5-325 MG PO TABS: 1.0000 | ORAL_TABLET | Freq: Four times a day (QID) | ORAL | Status: DC | PRN

## 2012-10-08 MED ORDER — INFLUENZA VIRUS VACC SPLIT PF IM SUSP: 0.5000 mL | INTRAMUSCULAR | Status: DC

## 2012-10-08 NOTE — Evaluation (Signed)
Physical Therapy Evaluation Patient Details Name: Katherine Cooper MRN: [id number] DOB: 1941-06-23 Today's Date: 10/08/2012 Time: 2956-2130 PT Time Calculation (min): 30 min  PT Assessment / Plan / Recommendation Clinical Impression  71 yo female admitted with inability to walk and decrease in functional independence  due to feet and hand pain. She also has generalized deconditioning She responded well to PT today and I expect she will improve in functinal independence with continued PT at d/c    PT Assessment  Patient needs continued PT services    Follow Up Recommendations  Post acute inpatient;Home health PT    Does the patient have the potential to tolerate intense rehabilitation   No, Recommend SNF  Barriers to Discharge   pt needs 24/7 supv/assist    Equipment Recommendations  None recommended by PT    Recommendations for Other Services     Frequency Min 3X/week    Precautions / Restrictions Precautions Precautions: Fall Precaution Comments: painful in hands and feet with mobility. history of gout Restrictions Weight Bearing Restrictions: No   Pertinent Vitals/Pain C/o pain in hands      Mobility  Bed Mobility Bed Mobility: Supine to Sit Supine to Sit: HOB elevated;With rails;5: Supervision Details for Bed Mobility Assistance: increased time for supine to EOB Transfers Transfers: Sit to Stand;Stand to Sit Sit to Stand: From chair/3-in-1;6: Modified independent (Device/Increase time) Stand to Sit: 6: Modified independent (Device/Increase time);To chair/3-in-1 Details for Transfer Assistance: increased time, pt pulls up on walker because whe can't push up due to wrist pain Ambulation/Gait Ambulation/Gait Assistance: 4: Min guard Ambulation Distance (Feet): 50 Feet Assistive device: Rolling walker Ambulation/Gait Assistance Details: pt stays forward bent over RW with weight on forearms, as she cannot grip with hands.  Pt needs encouragement to stand erect and  activate core muscle for stability Gait Pattern: Trunk flexed;Decreased step length - right;Step-through pattern Gait velocity: decreased General Gait Details: Pt stays forward bent on forearms as she cannot use hands. She was able to participate with walking and activate core muscles with direction.  Stairs: No Wheelchair Mobility Wheelchair Mobility: No    Shoulder Instructions     Exercises General Exercises - Lower Extremity Ankle Circles/Pumps: AROM;Both;10 reps;Seated Quad Sets: AROM;Both;10 reps;Seated Gluteal Sets: AROM;Both;10 reps;Standing Long Arc Quad: AROM;Both;10 reps;Seated Hip Flexion/Marching: AROM;Both;5 reps;Seated Other Exercises Other Exercises: pt instructed in bilateral UE hip to hip exercise in sitting and standing to activate core   PT Diagnosis: Difficulty walking;Abnormality of gait;Generalized weakness;Acute pain  PT Problem List: Decreased strength;Decreased range of motion;Pain;Decreased activity tolerance;Decreased mobility PT Treatment Interventions: DME instruction;Gait training;Functional mobility training;Therapeutic activities;Therapeutic exercise;Stair training   PT Goals Acute Rehab PT Goals PT Goal Formulation: With patient Time For Goal Achievement: 10/22/12 Potential to Achieve Goals: Good Pt will go Supine/Side to Sit: Independently PT Goal: Supine/Side to Sit - Progress: Goal set today Pt will go Sit to Supine/Side: Independently PT Goal: Sit to Supine/Side - Progress: Goal set today Pt will go Sit to Stand: Independently PT Goal: Sit to Stand - Progress: Goal set today Pt will go Stand to Sit: Independently Pt will Ambulate: >150 feet;with modified independence;with least restrictive assistive device PT Goal: Ambulate - Progress: Goal set today Pt will Go Up / Down Stairs: 1-2 stairs;with min assist PT Goal: Up/Down Stairs - Progress: Goal set today Pt will Perform Home Exercise Program: Independently PT Goal: Perform Home Exercise  Program - Progress: Goal set today  Visit Information  Last PT Received On: 10/08/12 Assistance Needed: +1  Subjective Data  Subjective: I can't go  back there  by myself Patient Stated Goal: Pt wants to go somewhere with more people   Prior Functioning  Home Living Lives With: Alone;Other (Comment) (has an aide 2 hours a day, 7 days week) Available Help at Discharge: Skilled Nursing Facility (pt doesnt confirm 24 assist available.) Type of Home: House Home Access: Stairs to enter [employer] of Steps: 2 Entrance Stairs-Rails: Right;Left;Can reach both Home Layout: One level Bathroom Shower/Tub: Tub/shower unit Home Adaptive Equipment: Bedside commode/3-in-1;Walker - rolling;Straight cane;Shower chair with back;Reacher;Sock aid Prior Function Level of Independence: Needs assistance Needs Assistance: Light Housekeeping (pt performs own B/D and cooking but difficult) Light Housekeeping: Total (an aide helps with tasks) Driving: No Communication Communication: No difficulties Dominant Hand: Left    Cognition  Overall Cognitive Status: Appears within functional limits for tasks assessed/performed Arousal/Alertness: Awake/alert Orientation Level: Appears intact for tasks assessed Behavior During Session: [facility] for tasks performed Cognition - Other Comments: pt frequently needs redirection to stay on task    Extremity/Trunk Assessment Right Upper Extremity Assessment RUE ROM/Strength/Tone: Deficits RUE ROM/Strength/Tone Deficits: painful wrists, hands with ROM. shoulder at least 3+/5 grossly Left Upper Extremity Assessment LUE ROM/Strength/Tone: Deficits LUE ROM/Strength/Tone Deficits: as above Right Lower Extremity Assessment RLE ROM/Strength/Tone: WFL for tasks assessed (pt with genu valgus and generalized muscle atrophy) Left Lower Extremity Assessment LLE ROM/Strength/Tone: WFL for tasks assessed (pt with genu valgus and generalized muscle atrophy) Trunk  Assessment Trunk Assessment: Other exceptions Trunk Exceptions: obese with generalized muscle atrophy   Balance Balance Balance Assessed: Yes Static Standing Balance Static Standing - Balance Support: Bilateral upper extremity supported;No upper extremity supported Static Standing - Level of Assistance: 6: Modified independent (Device/Increase time) Static Standing - Comment/# of Minutes: 5 worked on activation of core during standing for stability without use of UEs Dynamic Standing Balance Dynamic Standing - Level of Assistance: 4: Min assist  End of Session PT - End of Session Activity Tolerance: Patient tolerated treatment well Patient left: in chair;with nursing in room;with call bell/phone within reach Nurse Communication: Mobility status  GP Functional Assessment Tool Used: clincial judgement Functional Limitation: Mobility: Walking and moving around Mobility: Walking and Moving Around Current Status (B1478): At least 20 percent but less than 40 percent impaired, limited or restricted Mobility: Walking and Moving Around Goal Status [phone]): At least 1 percent but less than 20 percent impaired, limited or restricted   Donnetta Hail 10/08/2012, 10:46 AM

## 2012-10-08 NOTE — Clinical Social Work Psychosocial (Addendum)
    Clinical Social Work Department BRIEF PSYCHOSOCIAL ASSESSMENT 10/08/2012  Patient:  Katherine Cooper, Katherine Cooper     Account Number:  [id number]     Admit date:  10/07/2012  Clinical Social Worker:  Jacelyn Grip  Date/Time:  10/08/2012 12:30 PM  Referred by:  Physician  Date Referred:  10/08/2012 Referred for  SNF Placement   Other Referral:   Interview type:  Patient Other interview type:   and patient daughters at bedside    PSYCHOSOCIAL DATA Living Status:  ALONE Admitted from facility:   Level of care:   Primary support name:  Katherine Cooper/daughter Primary support relationship to patient:  CHILD, ADULT Degree of support available:   strong    CURRENT CONCERNS Current Concerns  Post-Acute Placement   Other Concerns:    SOCIAL WORK ASSESSMENT / PLAN CSW received notification from MD and RNCM that PT/OT recommending SNF placement as pt does not have 24 hour supervision at home and pt only meeting observation status in hospital. CSW reviewed chart and pt insurance is [facility] which does not require three night inpatient stay.    CSW met with pt and pt daughters at bedside. CSW discussed recommendation for SNF and pt agreeable to SNF placement in [facility]. Pt stated that she has previously completed rehab at [facility] and pt is interested in returning to facility, but agreeable to SNF search in all of [facility] for secondary options in case no bed availability at [facility]. CSW completed FL2 and initiated SNF search to [facility]. Pt has exisiting pasarr number. CSW contacted [facility] to notify of pt interest in facility. CSW to follow up with pt and pt daughter in regard to bed offers. CSW to continue to follow to assist with discharge planning needs.   Assessment/plan status:  Psychosocial Support/Ongoing Assessment of Needs Other assessment/ plan:   discharge planning   Information/referral to community  resources:   [facility] list    PATIENT'S/FAMILY'S RESPONSE TO PLAN OF CARE: Pt alert and oriented. Pt is hopeful for return to [facility]. Pt and pt daughters agreeable to discharge today if bed available at SNF.

## 2012-10-08 NOTE — Progress Notes (Addendum)
Clinical Social Work Department CLINICAL SOCIAL WORK PLACEMENT NOTE 10/08/2012  Patient:  Katherine Cooper, Katherine Cooper  Account Number:  [id number] Admit date:  10/07/2012  Clinical Social Worker:  Jacelyn Grip  Date/time:  10/08/2012 12:30 PM  Clinical Social Work is seeking post-discharge placement for this patient at the following level of care:   SKILLED NURSING   (*CSW will update this form in Epic as items are completed)   10/08/2012  Patient/family provided with [facility] Department of Clinical Social Work's list of facilities offering this level of care within the geographic area requested by the patient (or if unable, by the patient's family).  10/08/2012  Patient/family informed of their freedom to choose among providers that offer the needed level of care, that participate in Medicare, Medicaid or managed care program needed by the patient, have an available bed and are willing to accept the patient.  10/08/2012  Patient/family informed of [facility]' ownership interest in [facility], as well as of the fact that they are under no obligation to receive care at this facility.  PASARR submitted to EDS on 10/08/2012 PASARR number received from EDS on 10/08/2012  FL2 transmitted to all facilities in geographic area requested by pt/family on  10/08/2012 FL2 transmitted to all facilities within larger geographic area on   Patient informed that his/her managed care company has contracts with or will negotiate with  certain facilities, including the following:     Patient/family informed of bed offers received:  10/08/2012 Patient chooses bed at [facility] Physician recommends and patient chooses bed at    Patient to be transferred to  on [facility] on 10/08/2012  Patient to be transferred to facility by ambulance Sharin Mons)  The following physician request were entered in Epic:   Additional Comments:

## 2012-10-08 NOTE — Evaluation (Signed)
Occupational Therapy Evaluation Patient Details Name: Katherine Cooper MRN: [id number] DOB: 11-26-1941 Today's Date: 10/08/2012 Time: 4098-1191 OT Time Calculation (min): 30 min  OT Assessment / Plan / Recommendation Clinical Impression  Katherine Cooper is a 71 y.o. female who presents to the ED for the 3rd time this week with c/o gout attack. She presents with increased pain, decreased funtional mobility and ADL. Will benefit from skilled OT services to improve ADL independence and safety.    OT Assessment  Patient needs continued OT Services    Follow Up Recommendations  Skilled nursing facility    Barriers to Discharge      Equipment Recommendations  None recommended by OT    Recommendations for Other Services    Frequency  Min 2X/week    Precautions / Restrictions Precautions Precautions: Fall Precaution Comments: painful in hands and feet with mobility Restrictions Weight Bearing Restrictions: No        ADL  Eating/Feeding: Set up;Performed Where Assessed - Eating/Feeding: Chair Grooming: Simulated;Wash/dry hands;Set up Where Assessed - Grooming: Supported sitting Upper Body Bathing: Simulated;Chest;Right arm;Left arm;Abdomen;Minimal assistance Where Assessed - Upper Body Bathing: Unsupported sitting Lower Body Bathing: Simulated;Moderate assistance Where Assessed - Lower Body Bathing: Supported sit to stand Upper Body Dressing: Simulated;Minimal assistance Where Assessed - Upper Body Dressing: Unsupported sitting Lower Body Dressing: Simulated;Moderate assistance;Other (comment) (without AE. pt uses sock aid at home) Where Assessed - Lower Body Dressing: Supported sit to stand Toilet Transfer: Performed;Minimal assistance;Other (comment) (very slow, painful in hands, feet) Toilet Transfer Method: Stand pivot Toilet Transfer Equipment: Bedside commode Toileting - Clothing Manipulation and Hygiene: Simulated;Minimal assistance Where Assessed - Medical sales representative and Hygiene: Sit to stand from 3-in-1 or toilet Tub/Shower Transfer Method: Not assessed ADL Comments: Pt uses a sock aid to don socks at home. Able to reach down to don pants and underwear. Pt reports it takes her a long time to open containers to prepare meals, etc because of her painful hands. She uses a RW most of the time at home. Pt very slow with functional transfer to [facility] and then to chair, needing to hold to armrests to help her pivot most of transfer as her feet are so painful.     OT Diagnosis: Generalized weakness  OT Problem List: Decreased strength;Decreased range of motion;Decreased activity tolerance;Decreased knowledge of use of DME or AE;Pain OT Treatment Interventions: Self-care/ADL training;Therapeutic activities;DME and/or AE instruction;Patient/family education   OT Goals Acute Rehab OT Goals OT Goal Formulation: With patient Time For Goal Achievement: 10/22/12 Potential to Achieve Goals: Good ADL Goals Pt Will Perform Grooming: with min assist;Other (comment);Standing at sink (min guard assist) ADL Goal: Grooming - Progress: Goal set today Pt Will Perform Upper Body Bathing: with set-up;Sitting, edge of bed;Sitting, chair;Unsupported ADL Goal: Upper Body Bathing - Progress: Goal set today Pt Will Perform Lower Body Bathing: with supervision;Sit to stand from chair;Sit to stand from bed;with adaptive equipment ADL Goal: Lower Body Bathing - Progress: Goal set today Pt Will Perform Upper Body Dressing: with set-up;Unsupported;Sitting, bed;Sitting, chair ADL Goal: Upper Body Dressing - Progress: Goal set today Pt Will Perform Lower Body Dressing: with supervision;Sit to stand from chair;Sit to stand from bed;with adaptive equipment ADL Goal: Lower Body Dressing - Progress: Goal set today Pt Will Transfer to Toilet: with supervision;Ambulation;3-in-1;with DME ADL Goal: Toilet Transfer - Progress: Goal set today Pt Will Perform Toileting - Clothing  Manipulation: with supervision;Standing ADL Goal: Toileting - Clothing Manipulation - Progress: Goal set today  Visit  Information  Last OT Received On: 10/08/12 Assistance Needed: +1 (for pivot transfer to [facility] today)    Subjective Data  Subjective: I have several walkers like that Patient Stated Goal: none stated. agreeable to get up to [facility]   Prior Functioning     Home Living Lives With: Alone;Other (Comment) (has an aide 2 hours a day, 7 days week) Available Help at Discharge: Skilled Nursing Facility (pt doesnt confirm 24 assist available.) Type of Home: House Home Access: Stairs to enter [employer] of Steps: 2 Entrance Stairs-Rails: Right;Left;Can reach both Home Layout: One level Bathroom Shower/Tub: Tub/shower unit Home Adaptive Equipment: Bedside commode/3-in-1;Walker - rolling;Straight cane;Shower chair with back;Reacher;Sock aid Prior Function Level of Independence: Needs assistance Needs Assistance: Light Housekeeping (pt performs own B/D and cooking but difficult) Light Housekeeping: Total (an aide helps with tasks) Driving: No Communication Communication: No difficulties Dominant Hand: Left         Vision/Perception     Cognition  Overall Cognitive Status: Appears within functional limits for tasks assessed/performed Arousal/Alertness: Awake/alert Orientation Level: Appears intact for tasks assessed Behavior During Session: [facility] for tasks performed Cognition - Other Comments: at times difficult to get pt to answer questions succinctly    Extremity/Trunk Assessment Right Upper Extremity Assessment RUE ROM/Strength/Tone: Deficits RUE ROM/Strength/Tone Deficits: painful wrists, hands with ROM. shoulder at least 3+/5 grossly Left Upper Extremity Assessment LUE ROM/Strength/Tone: Deficits LUE ROM/Strength/Tone Deficits: as above     Mobility Bed Mobility Bed Mobility: Supine to Sit Supine to Sit: HOB elevated;With rails;5:  Supervision Details for Bed Mobility Assistance: increased time for supine to EOB Transfers Transfers: Sit to Stand;Stand to Sit Sit to Stand: 4: Min assist;From bed;From chair/3-in-1 Stand to Sit: 4: Min assist;To chair/3-in-1 Details for Transfer Assistance: increased time, assist to rise and help control descent.      Shoulder Instructions     Exercise     Balance Balance Balance Assessed: Yes Dynamic Standing Balance Dynamic Standing - Level of Assistance: 4: Min assist   End of Session OT - End of Session Equipment Utilized During Treatment: Gait belt Activity Tolerance: Patient limited by pain Patient left: in chair;with call bell/phone within reach  GO Functional Assessment Tool Used: clinical judgement Functional Limitation: Self care Self Care Current Status (Z6109): At least 20 percent but less than 40 percent impaired, limited or restricted Self Care Goal Status (U0454): At least 1 percent but less than 20 percent impaired, limited or restricted   Lennox Laity [phone] 10/08/2012, 9:34 AM

## 2012-10-08 NOTE — Progress Notes (Signed)
Clinical Social Worker met with pt and pt daughters to discuss bed offers. Pt chooses bed at [facility]. Clinical Social Worker contacted facility to notify and facility confirmed bed availability for today. Clinical Social Worker notified MD. Clinical Social Worker to facilitate pt discharge needs.  Jacklynn Lewis, MSW, LCSWA  Clinical Social Work [phone]

## 2012-10-08 NOTE — Progress Notes (Signed)
Clinical Social Worker facilitated pt discharge needs including contacting facility, faxing pt discharge information via [facility], discussing with pt and pt daughters, providing contact information to RN to call report, and arranging ambulance transportation for pt to [facility]. No further social work needs identified at this time. Clinical Social Worker signing off.   Jacklynn Lewis, MSW, LCSWA  Clinical Social Work [phone]

## 2012-10-08 NOTE — Progress Notes (Signed)
Report called to Jacki Cones, RN Nursing Supervisor at [facility].Katherine Cooper

## 2012-10-08 NOTE — Discharge Summary (Signed)
Physician Discharge Summary  Patient ID: Katherine Cooper MRN: [id number] DOB/AGE: [birth year] 71 y.o.  Admit date: 10/07/2012 Discharge date: 10/08/2012  Primary Care Physician:  Ron Parker, MD   Discharge Diagnoses:    Active Problems:  Bilateral leg edema  Gout  HTN (hypertension)  CKD (chronic kidney disease) stage 4, GFR 15-29 ml/min  Hypotension      Medication List     As of 10/08/2012  3:05 PM    STOP taking these medications         ciprofloxacin 250 MG tablet   Commonly known as: CIPRO      TAKE these medications         acetaminophen 500 MG tablet   Commonly known as: TYLENOL   Take 500 mg by mouth every 6 (six) hours as needed. pain      albuterol 108 (90 BASE) MCG/ACT inhaler   Commonly known as: PROVENTIL HFA;VENTOLIN HFA   Inhale 2 puffs into the lungs every 6 (six) hours as needed. For shortness of breath      allopurinol 300 MG tablet   Commonly known as: ZYLOPRIM   Take 300 mg by mouth 3 (three) times daily.      carvedilol 3.125 MG tablet   Commonly known as: COREG   Take 3.125 mg by mouth 2 (two) times daily with a meal.      clorazepate 7.5 MG tablet   Commonly known as: TRANXENE   Take 7.5 mg by mouth 3 (three) times daily.      colchicine 0.6 MG tablet   Take 0.6 mg by mouth daily.      donepezil 10 MG tablet   Commonly known as: ARICEPT   Take 10 mg by mouth at bedtime.      furosemide 40 MG tablet   Commonly known as: LASIX   Take 40 mg by mouth 2 (two) times daily.      HYDROcodone-acetaminophen 5-325 MG per tablet   Commonly known as: NORCO/VICODIN   Take 1 tablet by mouth every 6 (six) hours as needed. Pain      loratadine 10 MG tablet   Commonly known as: CLARITIN   Take 10 mg by mouth daily as needed. For allergies.      memantine 10 MG tablet   Commonly known as: NAMENDA   Take 10 mg by mouth 2 (two) times daily.      multivitamin with minerals Tabs   Take 1 tablet by mouth daily.      omeprazole 20 MG  capsule   Commonly known as: PRILOSEC   Take 20 mg by mouth daily with breakfast.      oxybutynin 5 MG tablet   Commonly known as: DITROPAN   Take 5 mg by mouth 3 (three) times daily.      potassium chloride SA 20 MEQ tablet   Commonly known as: K-DUR,KLOR-CON   Take 20 mEq by mouth daily.      simvastatin 40 MG tablet   Commonly known as: ZOCOR   Take 40 mg by mouth at bedtime.      sucralfate 1 G tablet   Commonly known as: CARAFATE   Take 1 g by mouth daily.      valsartan 320 MG tablet   Commonly known as: DIOVAN   Take 320 mg by mouth daily.         Disposition and Follow-up:  Will be discharged today to SNF in stable condition.  Consults:  None  Significant Diagnostic Studies:  Dg Chest Port 1 View  10/07/2012  *RADIOLOGY REPORT*  Clinical Data: Gout  PORTABLE CHEST - 1 VIEW  Comparison: 07/21/2012.  Findings: The heart is enlarged but appears stable.  There is tortuosity and ectasia of the thoracic aorta.  The lungs are clear. No pleural effusion.  The bony thorax is intact.  IMPRESSION: Stable cardiac enlargement and tortuous aorta but no acute pulmonary findings.   Original Report Authenticated By: P. Loralie Champagne, M.D.    Dg Hand Complete Left  10/08/2012  *RADIOLOGY REPORT*  Clinical Data: Pain and gout.  LEFT HAND - COMPLETE 3+ VIEW  Comparison: 07/11/2007  Findings: Chronic deformity of the proximal interphalangeal joint of the fifth digit.  No typical findings of gouty arthritis.  No soft tissue mass or calcification.  No periarticular erosions. There is overlap of fingers on the attempted lateral view.  IMPRESSION: No typical findings of gouty arthritis.   Original Report Authenticated By: Consuello Bossier, M.D.    Dg Hand Complete Right  10/08/2012  *RADIOLOGY REPORT*  Clinical Data: Pain and history of gout.  RIGHT HAND - COMPLETE 3+ VIEW  Comparison: None.  Findings: Mild soft tissue swelling about the proximal interphalangeal joint of the third digit.   Chronic deformity of the proximal interphalangeal joint of the fifth digit.  There is osseous irregularity about the metacarpal phalangeal joint of the first digit.  No soft tissue masses or soft tissue calcification.  Suspicion of a periarticular erosion or cyst at the radial aspect of the radiocarpal joint.  Overlap of fingers on the lateral.  IMPRESSION:  1.  Osseous irregularity of the first metacarpal phalangeal joint. Favored to be degenerative or related to remote trauma.  Not typical of gouty arthritis, which cannot be excluded. 2.  Suspicion of early periarticular erosion or subchondral cyst in the radial aspect of the radiocarpal joint.  This is indeterminate and can be reevaluated on follow-up.   Original Report Authenticated By: Consuello Bossier, M.D.    Dg Foot Complete Left  10/08/2012  *RADIOLOGY REPORT*  Clinical Data: Pain.  Gout.  LEFT FOOT - COMPLETE 3+ VIEW  Comparison: 07/04/2009  Findings: Callus deposition of the second metatarsal shaft is again identified.  Likely callus deposition about the fourth metatarsal as well, chronic.  No acute fracture.  Mild diffuse soft tissue swelling.  No periarticular osteopenia.  No significant joint space narrowing or periarticular erosions.  No soft tissue calcification or soft tissue mass identified.  Mild pes planus deformity.  IMPRESSION: No typical findings of gouty arthritis.  Diffuse soft tissue swelling.  Second and possibly fourth remote metatarsal shaft fractures with resultant callus deposition.   Original Report Authenticated By: Consuello Bossier, M.D.    Dg Foot Complete Right  10/08/2012  *RADIOLOGY REPORT*  Clinical Data: Pain and gout.  RIGHT FOOT COMPLETE - 3+ VIEW  Comparison: 07/04/2009  Findings: Diffuse soft tissue swelling.  Joint spaces are maintained.  No periarticular erosions.  No soft tissue mass or soft tissue calcification.  Mild pes planus deformity.  IMPRESSION: No typical findings of gouty arthritis.  Diffuse soft tissue  swelling.   Original Report Authenticated By: Consuello Bossier, M.D.     Brief H and P: For complete details please refer to admission H and P, but in brief  Patient is a71 y.o. female who presents to the ED for the 3rd time this week with c/o gout attack. Of note she had a previous gout attack  about 2 weeks ago and then previous to that in July of this year. Her pain is located in B hands, wrists, and feet, and is typical of her gout attacks. She has also been having swelling in her legs worse in the past week because she admits she hasnt been taking her lasix for the past week (lasix makes her go to the bathroom she says and it hurts so much to get up to walk to the bathroom). She states she has been taking her other meds as prescribed.  In the ED the patient refused / was unable to ambulate, was found to have a uric acid of 16.2, hospitalist service has been asked to admit the patient for pain control.     Hospital Course:  Active Problems:  Bilateral leg edema  Gout  HTN (hypertension)  CKD (chronic kidney disease) stage 4, GFR 15-29 ml/min  Hypotension    Gout/Gait imbalance -After thoroughly examining and talking with the patient, she admits that she does not have so much in the way of pain, it is more that she feels unsafe in living alone because of her balance issues. -SW has been able to find her a bed at a SNF that is willing to take her today. -She has had no active issues this admission. -I will DC prednisone that was started last night for possible gout flare.   Time spent on Discharge: Greater than 30 minutes.  SignedChaya Jan [facility] Pager: [phone] 10/08/2012, 3:05 PM

## 2012-10-09 LAB — URINE CULTURE

## 2012-10-22 ENCOUNTER — Other Ambulatory Visit: Payer: Self-pay | Admitting: Medical Oncology

## 2012-10-22 ENCOUNTER — Encounter: Payer: Self-pay | Admitting: Medical Oncology

## 2012-10-22 DIAGNOSIS — D649 Anemia, unspecified: Secondary | ICD-10-CM

## 2012-10-22 DIAGNOSIS — C50919 Malignant neoplasm of unspecified site of unspecified female breast: Secondary | ICD-10-CM | POA: Insufficient documentation

## 2012-10-22 HISTORY — DX: Malignant neoplasm of unspecified site of unspecified female breast: C50.919

## 2013-05-26 ENCOUNTER — Emergency Department (HOSPITAL_COMMUNITY): Payer: PRIVATE HEALTH INSURANCE

## 2013-05-26 ENCOUNTER — Encounter (HOSPITAL_COMMUNITY): Payer: Self-pay

## 2013-05-26 ENCOUNTER — Inpatient Hospital Stay (HOSPITAL_COMMUNITY)
Admission: EM | Admit: 2013-05-26 | Discharge: 2013-05-27 | DRG: 194 | Disposition: A | Discharge status: Nursing Home (Includes ICF) | Payer: PRIVATE HEALTH INSURANCE | Attending: Family Medicine | Admitting: Family Medicine

## 2013-05-26 DIAGNOSIS — E860 Dehydration: Secondary | ICD-10-CM

## 2013-05-26 DIAGNOSIS — R0602 Shortness of breath: Secondary | ICD-10-CM

## 2013-05-26 DIAGNOSIS — Z6837 Body mass index (BMI) 37.0-37.9, adult: Secondary | ICD-10-CM

## 2013-05-26 DIAGNOSIS — E669 Obesity, unspecified: Secondary | ICD-10-CM

## 2013-05-26 DIAGNOSIS — G309 Alzheimer's disease, unspecified: Secondary | ICD-10-CM | POA: Diagnosis present

## 2013-05-26 DIAGNOSIS — E039 Hypothyroidism, unspecified: Secondary | ICD-10-CM | POA: Diagnosis present

## 2013-05-26 DIAGNOSIS — G8929 Other chronic pain: Secondary | ICD-10-CM | POA: Diagnosis present

## 2013-05-26 DIAGNOSIS — F411 Generalized anxiety disorder: Secondary | ICD-10-CM

## 2013-05-26 DIAGNOSIS — R600 Localized edema: Secondary | ICD-10-CM

## 2013-05-26 DIAGNOSIS — R011 Cardiac murmur, unspecified: Secondary | ICD-10-CM | POA: Diagnosis present

## 2013-05-26 DIAGNOSIS — R748 Abnormal levels of other serum enzymes: Secondary | ICD-10-CM | POA: Diagnosis present

## 2013-05-26 DIAGNOSIS — M109 Gout, unspecified: Secondary | ICD-10-CM

## 2013-05-26 DIAGNOSIS — I959 Hypotension, unspecified: Secondary | ICD-10-CM

## 2013-05-26 DIAGNOSIS — N184 Chronic kidney disease, stage 4 (severe): Secondary | ICD-10-CM | POA: Diagnosis present

## 2013-05-26 DIAGNOSIS — Z853 Personal history of malignant neoplasm of breast: Secondary | ICD-10-CM

## 2013-05-26 DIAGNOSIS — E875 Hyperkalemia: Secondary | ICD-10-CM

## 2013-05-26 DIAGNOSIS — F419 Anxiety disorder, unspecified: Secondary | ICD-10-CM

## 2013-05-26 DIAGNOSIS — R079 Chest pain, unspecified: Secondary | ICD-10-CM | POA: Diagnosis present

## 2013-05-26 DIAGNOSIS — I129 Hypertensive chronic kidney disease with stage 1 through stage 4 chronic kidney disease, or unspecified chronic kidney disease: Secondary | ICD-10-CM | POA: Diagnosis present

## 2013-05-26 DIAGNOSIS — Z79899 Other long term (current) drug therapy: Secondary | ICD-10-CM

## 2013-05-26 DIAGNOSIS — I10 Essential (primary) hypertension: Secondary | ICD-10-CM

## 2013-05-26 DIAGNOSIS — D649 Anemia, unspecified: Secondary | ICD-10-CM

## 2013-05-26 DIAGNOSIS — Z87891 Personal history of nicotine dependence: Secondary | ICD-10-CM

## 2013-05-26 DIAGNOSIS — Z8673 Personal history of transient ischemic attack (TIA), and cerebral infarction without residual deficits: Secondary | ICD-10-CM

## 2013-05-26 DIAGNOSIS — F0280 Dementia in other diseases classified elsewhere without behavioral disturbance: Secondary | ICD-10-CM | POA: Diagnosis present

## 2013-05-26 DIAGNOSIS — J189 Pneumonia, unspecified organism: Principal | ICD-10-CM | POA: Diagnosis present

## 2013-05-26 HISTORY — DX: Shortness of breath: R06.02

## 2013-05-26 LAB — BASIC METABOLIC PANEL
Calcium: 9.8 mg/dL (ref 8.4–10.5)
GFR calc non Af Amer: 32 mL/min — ABNORMAL LOW (ref >90)
Glucose, Bld: 87 mg/dL (ref 70–99)
Sodium: 138 mEq/L (ref 135–145)

## 2013-05-26 LAB — URINALYSIS, ROUTINE W REFLEX MICROSCOPIC
Hgb urine dipstick: NEGATIVE
Ketones, ur: NEGATIVE mg/dL
Protein, ur: NEGATIVE mg/dL
Urobilinogen, UA: 0.2 mg/dL (ref 0.0–1.0)

## 2013-05-26 LAB — CBC WITH DIFFERENTIAL/PLATELET
Basophils Relative: 1 % (ref 0–1)
Eosinophils Absolute: 0.7 10*3/uL (ref 0.0–0.7)
MCH: 27.1 pg (ref 26.0–34.0)
MCHC: 30.7 g/dL (ref 30.0–36.0)
Neutrophils Relative %: 53 % (ref 43–77)
Platelets: 336 10*3/uL (ref 150–400)

## 2013-05-26 LAB — TROPONIN I: Troponin I: <0.3 ng/mL (ref <0.30)

## 2013-05-26 LAB — URINE MICROSCOPIC-ADD ON

## 2013-05-26 MED ORDER — SIMVASTATIN 40 MG PO TABS
40.0000 mg | ORAL_TABLET | Freq: Every day | ORAL | Status: DC
  Administered 2013-05-27: 40 mg via ORAL
  Filled 2013-05-26 (×2): qty 1

## 2013-05-26 MED ORDER — ALLOPURINOL 300 MG PO TABS
300.0000 mg | ORAL_TABLET | Freq: Three times a day (TID) | ORAL | Status: DC
  Administered 2013-05-27 (×3): 300 mg via ORAL
  Filled 2013-05-26 (×4): qty 1

## 2013-05-26 MED ORDER — FUROSEMIDE 40 MG PO TABS
40.0000 mg | ORAL_TABLET | Freq: Two times a day (BID) | ORAL | Status: DC
  Administered 2013-05-27: 40 mg via ORAL
  Filled 2013-05-26 (×4): qty 1

## 2013-05-26 MED ORDER — COLCHICINE 0.6 MG PO TABS
0.6000 mg | ORAL_TABLET | Freq: Every day | ORAL | Status: DC
  Administered 2013-05-27: 0.6 mg via ORAL
  Filled 2013-05-26: qty 1

## 2013-05-26 MED ORDER — OXYBUTYNIN CHLORIDE 5 MG PO TABS
5.0000 mg | ORAL_TABLET | Freq: Three times a day (TID) | ORAL | Status: DC
  Administered 2013-05-27 (×3): 5 mg via ORAL
  Filled 2013-05-26 (×4): qty 1

## 2013-05-26 MED ORDER — SODIUM CHLORIDE 0.9 % IV SOLN: INTRAVENOUS | Status: DC

## 2013-05-26 MED ORDER — DONEPEZIL HCL 10 MG PO TABS
10.0000 mg | ORAL_TABLET | Freq: Every day | ORAL | Status: DC
  Administered 2013-05-27: 10 mg via ORAL
  Filled 2013-05-26 (×2): qty 1

## 2013-05-26 MED ORDER — ENOXAPARIN SODIUM 30 MG/0.3ML ~~LOC~~ SOLN
30.0000 mg | Freq: Every day | SUBCUTANEOUS | Status: DC
  Filled 2013-05-26: qty 0.3

## 2013-05-26 MED ORDER — ENOXAPARIN SODIUM 100 MG/ML ~~LOC~~ SOLN
1.0000 mg/kg | Freq: Once | SUBCUTANEOUS | Status: AC
  Administered 2013-05-27: 95 mg via SUBCUTANEOUS
  Filled 2013-05-26: qty 1

## 2013-05-26 MED ORDER — CARVEDILOL 3.125 MG PO TABS
3.1250 mg | ORAL_TABLET | Freq: Two times a day (BID) | ORAL | Status: DC
  Administered 2013-05-27 (×2): 3.125 mg via ORAL
  Filled 2013-05-26 (×3): qty 1

## 2013-05-26 MED ORDER — SODIUM CHLORIDE 0.9 % IV SOLN
Freq: Once | INTRAVENOUS | Status: AC
  Administered 2013-05-26: 18:00:00 via INTRAVENOUS

## 2013-05-26 MED ORDER — CLORAZEPATE DIPOTASSIUM 7.5 MG PO TABS
7.5000 mg | ORAL_TABLET | Freq: Three times a day (TID) | ORAL | Status: DC
  Administered 2013-05-27 (×3): 7.5 mg via ORAL
  Filled 2013-05-26 (×3): qty 1

## 2013-05-26 MED ORDER — AZITHROMYCIN 500 MG PO TABS
500.0000 mg | ORAL_TABLET | ORAL | Status: DC
  Administered 2013-05-27: 500 mg via ORAL
  Filled 2013-05-26 (×2): qty 1

## 2013-05-26 MED ORDER — MEMANTINE HCL 10 MG PO TABS
10.0000 mg | ORAL_TABLET | Freq: Two times a day (BID) | ORAL | Status: DC
  Administered 2013-05-27 (×2): 10 mg via ORAL
  Filled 2013-05-26 (×3): qty 1

## 2013-05-26 MED ORDER — ALBUTEROL SULFATE (5 MG/ML) 0.5% IN NEBU: 2.5000 mg | INHALATION_SOLUTION | RESPIRATORY_TRACT | Status: DC | PRN

## 2013-05-26 MED ORDER — MORPHINE SULFATE 2 MG/ML IJ SOLN
2.0000 mg | Freq: Once | INTRAMUSCULAR | Status: AC
  Administered 2013-05-26: 2 mg via INTRAVENOUS
  Filled 2013-05-26: qty 1

## 2013-05-26 MED ORDER — DEXTROSE 5 % IV SOLN
1.0000 g | INTRAVENOUS | Status: DC
  Administered 2013-05-27: 1 g via INTRAVENOUS
  Filled 2013-05-26 (×2): qty 10

## 2013-05-26 MED ORDER — LORATADINE 10 MG PO TABS
10.0000 mg | ORAL_TABLET | Freq: Every day | ORAL | Status: DC | PRN
  Filled 2013-05-26: qty 1

## 2013-05-26 NOTE — ED Notes (Signed)
PA aware that there is no NM person available for test at this time

## 2013-05-26 NOTE — ED Notes (Signed)
Per EMS pt states woke up with lt shoulder pain/chest pain, states slept wrong last pm. Pt states having muscle pain.

## 2013-05-26 NOTE — ED Provider Notes (Signed)
History     CSN: [id number]  Arrival date & time 05/26/13  1447   First MD Initiated Contact with Patient 05/26/13 1519      Chief Complaint  Patient presents with  . Shoulder Pain    (Consider location/radiation/quality/duration/timing/severity/associated sxs/prior treatment) Patient is a 72 y.o. female presenting with shoulder pain. The history is provided by the patient. No language interpreter was used.  Shoulder Pain This is a new problem. Associated symptoms include chest pain and neck pain. Pertinent negatives include no abdominal pain, fever, headaches, nausea, rash or vomiting. Associated symptoms comments: Pain that started this morning in her left chest and later radiated into shoulder and neck. She feels discomfort into the posterior shoulder with movement. She reports shortness of breath without cough or fever as well. She has no appetite but denies nausea or vomiting. The chest pain has been constant..    Past Medical History  Diagnosis Date  . Arthritis   . Hypertension   . Gout   . Obesity   . Angina   . Heart murmur   . Chronic kidney disease   . Stroke   . Recurrent upper respiratory infection (URI)   . Hypothyroidism   . Blood transfusion   . Headache(784.0)   . Anxiety   . Dysrhythmia   . Anemia   . CAP (community acquired pneumonia)   . Alzheimer disease   . Dementia   . Breast cancer 10/22/2012    Past Surgical History  Procedure Laterality Date  . Uterine fibroid surgery      Family History  Problem Relation Age of Onset  . Asthma Mother   . Heart attack Father     History  Substance Use Topics  . Smoking status: Former Smoker -- 0.25 packs/day    Types: Cigarettes    Quit date: 03/13/2012  . Smokeless tobacco: Not on file  . Alcohol Use: No    OB History   Grav Para Term Preterm Abortions TAB SAB Ect Mult Living                  Review of Systems  Constitutional: Positive for appetite change. Negative for fever.  HENT:  Positive for neck pain. Negative for trouble swallowing.   Respiratory: Positive for shortness of breath.   Cardiovascular: Positive for chest pain. Negative for leg swelling.  Gastrointestinal: Negative for nausea, vomiting and abdominal pain.  Genitourinary: Positive for dysuria and frequency.  Musculoskeletal:       See HPI.  Skin: Positive for color change. Negative for rash.  Neurological: Negative for headaches.  Psychiatric/Behavioral: Negative for confusion.    Allergies  Sulfa antibiotics  Home Medications   Current Outpatient Rx  Name  Route  Sig  Dispense  Refill  . acetaminophen (TYLENOL) 500 MG tablet   Oral   Take 500 mg by mouth every 6 (six) hours as needed. pain         . albuterol (PROVENTIL HFA;VENTOLIN HFA) 108 (90 BASE) MCG/ACT inhaler   Inhalation   Inhale 2 puffs into the lungs every 6 (six) hours as needed. For shortness of breath          . allopurinol (ZYLOPRIM) 300 MG tablet   Oral   Take 300 mg by mouth 3 (three) times daily.          . carvedilol (COREG) 3.125 MG tablet   Oral   Take 3.125 mg by mouth 2 (two) times daily with a meal.          .  clorazepate (TRANXENE) 7.5 MG tablet   Oral   Take 7.5 mg by mouth 3 (three) times daily.         . colchicine 0.6 MG tablet   Oral   Take 0.6 mg by mouth daily.         Marland Kitchen donepezil (ARICEPT) 10 MG tablet   Oral   Take 10 mg by mouth at bedtime.          . furosemide (LASIX) 40 MG tablet   Oral   Take 40 mg by mouth 2 (two) times daily.          Marland Kitchen HYDROcodone-acetaminophen (NORCO/VICODIN) 5-325 MG per tablet   Oral   Take 1 tablet by mouth every 6 (six) hours as needed. Pain   30 tablet   0   . loratadine (CLARITIN) 10 MG tablet   Oral   Take 10 mg by mouth daily as needed. For allergies.         . memantine (NAMENDA) 10 MG tablet   Oral   Take 10 mg by mouth 2 (two) times daily.          . Multiple Vitamin (MULTIVITAMIN WITH MINERALS) TABS   Oral   Take 1  tablet by mouth daily.         Marland Kitchen omeprazole (PRILOSEC) 20 MG capsule   Oral   Take 20 mg by mouth daily with breakfast.         . oxybutynin (DITROPAN) 5 MG tablet   Oral   Take 5 mg by mouth 3 (three) times daily.           . potassium chloride SA (K-DUR,KLOR-CON) 20 MEQ tablet   Oral   Take 20 mEq by mouth daily.          . simvastatin (ZOCOR) 40 MG tablet   Oral   Take 40 mg by mouth at bedtime.           . sucralfate (CARAFATE) 1 G tablet   Oral   Take 1 g by mouth daily.          . valsartan (DIOVAN) 320 MG tablet   Oral   Take 320 mg by mouth daily.           BP 109/74  Pulse 88  Temp(Src) 98.3 F (36.8 C) (Oral)  Resp 16  SpO2 98%  Physical Exam  Constitutional: She is oriented to person, place, and time. She appears well-developed and well-nourished.  HENT:  Head: Normocephalic.  Neck: Normal range of motion. Neck supple.  Cardiovascular: Normal rate and regular rhythm.   Pulmonary/Chest: Effort normal and breath sounds normal. She has no wheezes. She has no rales.  Chest wall is mildly tender to palpation.   Abdominal: Soft. Bowel sounds are normal. There is no tenderness. There is no rebound and no guarding.  Musculoskeletal: Normal range of motion. She exhibits no edema.  Neurological: She is alert and oriented to person, place, and time.  Skin: Skin is warm and dry. No rash noted.  Psychiatric: She has a normal mood and affect.    ED Course  Procedures (including critical care time)  Labs Reviewed  CBC WITH DIFFERENTIAL  TROPONIN I  BASIC METABOLIC PANEL  URINALYSIS, ROUTINE W REFLEX MICROSCOPIC   No results found. Results for orders placed during the hospital encounter of 05/26/13  CBC WITH DIFFERENTIAL      Result Value Range   WBC 6.8  4.0 - 10.5 K/uL  RBC 4.06  3.87 - 5.11 MIL/uL   Hemoglobin 11.0 (*) 12.0 - 15.0 g/dL   HCT 40.9 (*) 81.1 - 91.4 %   MCV 88.2  78.0 - 100.0 fL   MCH 27.1  26.0 - 34.0 pg   MCHC 30.7  30.0  - 36.0 g/dL   RDW 78.2  95.6 - 21.3 %   Platelets 336  150 - 400 K/uL   Neutrophils Relative % 53  43 - 77 %   Neutro Abs 3.6  1.7 - 7.7 K/uL   Lymphocytes Relative 27  12 - 46 %   Lymphs Abs 1.9  0.7 - 4.0 K/uL   Monocytes Relative 9  3 - 12 %   Monocytes Absolute 0.6  0.1 - 1.0 K/uL   Eosinophils Relative 10 (*) 0 - 5 %   Eosinophils Absolute 0.7  0.0 - 0.7 K/uL   Basophils Relative 1  0 - 1 %   Basophils Absolute 0.1  0.0 - 0.1 K/uL  TROPONIN I      Result Value Range   Troponin I <0.30  <0.30 ng/mL  BASIC METABOLIC PANEL      Result Value Range   Sodium 138  135 - 145 mEq/L   Potassium 4.1  3.5 - 5.1 mEq/L   Chloride 99  96 - 112 mEq/L   CO2 31  19 - 32 mEq/L   Glucose, Bld 87  70 - 99 mg/dL   BUN 26 (*) 6 - 23 mg/dL   Creatinine, Ser 0.86 (*) 0.50 - 1.10 mg/dL   Calcium 9.8  8.4 - 57.8 mg/dL   GFR calc non Af Amer 32 (*) >90 mL/min   GFR calc Af Amer 37 (*) >90 mL/min  URINALYSIS, ROUTINE W REFLEX MICROSCOPIC      Result Value Range   Color, Urine YELLOW  YELLOW   APPearance CLEAR  CLEAR   Specific Gravity, Urine 1.012  1.005 - 1.030   pH 6.0  5.0 - 8.0   Glucose, UA NEGATIVE  NEGATIVE mg/dL   Hgb urine dipstick NEGATIVE  NEGATIVE   Bilirubin Urine NEGATIVE  NEGATIVE   Ketones, ur NEGATIVE  NEGATIVE mg/dL   Protein, ur NEGATIVE  NEGATIVE mg/dL   Urobilinogen, UA 0.2  0.0 - 1.0 mg/dL   Nitrite NEGATIVE  NEGATIVE   Leukocytes, UA TRACE (*) NEGATIVE  URINE MICROSCOPIC-ADD ON      Result Value Range   Squamous Epithelial / LPF FEW (*) RARE   WBC, UA 0-2  <3 WBC/hpf   Bacteria, UA RARE  RARE  D-DIMER, QUANTITATIVE      Result Value Range   D-Dimer, Quant 1.65 (*) 0.00 - 0.48 ug/mL-FEU   Dg Chest Portable 1 View  05/26/2013   *RADIOLOGY REPORT*  Clinical Data:  Shoulder pain  PORTABLE CHEST - 1 VIEW  Comparison: Prior chest x-ray 10/07/2012  Findings: Stable appearance of the chest with and atherosclerotic, ectatic and tortuous thoracic aorta.  Stable cardiomegaly.  Nonspecific patchy opacity in the right lung base.  No pleural effusion or pneumothorax.  No acute osseous abnormality.  IMPRESSION:  1.  Nonspecific patchy opacity in the right lung base may reflect atelectasis or early developing infiltrate.  2.  Stable cardiomegaly.   Original Report Authenticated By: Malachy Moan, M.D.    No diagnosis found. 1. Chest pain    MDM  Dr. Effie Shy in to see patient and feels with symptom set that D-dimer is required for full evaluation. Chest x-ray with ?  developing infiltrate but this is not felt to clinically correlate as it is right sided and her pain is left and, as well, she has no cough or fever. She has been comfortable with IV morphine, small dose. D-dimer found to be elevated. CT not possible with renal insufficiency and VQ unavailable tonight (per Tresa Endo RN's conversation with nuclear med no tech on-call).  Discussed with Dr. Daphane Shepherd, [facility] who accepts patient for overnight observation to obtain studies in the morning.         Arnoldo Hooker, PA-C 05/26/13 2257

## 2013-05-26 NOTE — H&P (Signed)
[facility] History and Physical  CHARLITA BRIAN ONG:[id number] DOB: May 20, 1941 DOA: 05/26/2013  Referring physician: ED physician PCP: Ron Parker, MD   Chief Complaint: shortness of breath   HPI:  Pt is 72 yo female who presents with progressively worsening shortness of breath associated with chest discomfort, throbbing and dull in nature, constant and 5/10 in severity, non radiating, no specific alleviating or aggravating factors, associated with productive cough of yellow sputum, subjective fevers, chills, malaise. No abdominal or urinary concerns.  In ED, CXR findings with ? PNA, d-dimer elevated and V/Q scan ordered to rule out PE. Pt given one full dose Lovenox. TRH asked to admit for further evaluation.   Assessment and Plan: Principal Problem:   Shortness of breath - unclear etiology, ? PNA - will admit to tele - d-dimer elevated, V/Q scan pending  - supportive care with ABX, antibiotics, nebulizers, oxygen  Active Problems:   Chest pain at rest - unclear etiology, as D-dimer elevated, Ed doctor ordered V/Q scan - will not be done till tomorrow am - pt has received full dose Lovenox in ED - CE x 3, 12 lead EKG, TSH - analgesia and oxygen as needed, telemetry monitoring    PNA (pneumonia) - based on symptoms and suggestive on CXR - pneumonia order set placed - ABX per protocol - supportive care with oxygen and nebulizer as needed - follow up on sputum analysis    CKD (chronic kidney disease) stage 4, GFR 15-29 ml/min - pt on Lasix and colchicine, both renally cleared - creatinine appears to be at pt's baseline ~1.4-1.5 - BMP in AM and follow closely  Code Status: Full Family Communication: Pt at bedside Disposition Plan: Admit to telemetry bed   Review of Systems:  Constitutional: Positive for fever, chills and malaise/fatigue. Negative for diaphoresis.  HENT: Negative for hearing loss, ear pain, nosebleeds, congestion, sore throat, neck pain, tinnitus  and ear discharge.   Eyes: Negative for blurred vision, double vision, photophobia, pain, discharge and redness.  Respiratory: Positive for cough, sputum production, shortness of breath   Cardiovascular: Negative for palpitations, orthopnea, claudication and leg swelling.  Gastrointestinal: Negative for nausea, vomiting and abdominal pain. Negative for heartburn, constipation, blood in stool and melena.  Genitourinary: Negative for dysuria, urgency, frequency, hematuria and flank pain.  Musculoskeletal: Negative for myalgias, back pain, joint pain and falls.  Skin: Negative for itching and rash.  Neurological: Negative for dizziness and weakness. Negative for tingling, tremors, sensory change, speech change, focal weakness, loss of consciousness and headaches.  Endo/Heme/Allergies: Negative for environmental allergies and polydipsia. Does not bruise/bleed easily.  Psychiatric/Behavioral: Negative for suicidal ideas. The patient is not nervous/anxious.      Past Medical History  Diagnosis Date  . Arthritis   . Hypertension   . Gout   . Obesity   . Angina   . Heart murmur   . Chronic kidney disease   . Stroke   . Recurrent upper respiratory infection (URI)   . Hypothyroidism   . Blood transfusion   . Headache(784.0)   . Anxiety   . Dysrhythmia   . Anemia   . CAP (community acquired pneumonia)   . Alzheimer disease   . Dementia   . Breast cancer 10/22/2012    Past Surgical History  Procedure Laterality Date  . Uterine fibroid surgery      Social History:  reports that she quit smoking about 14 months ago. Her smoking use included Cigarettes. She smoked 0.25 packs per  day. She does not have any smokeless tobacco history on file. She reports that she does not drink alcohol or use illicit drugs.  Allergies  Allergen Reactions  . Sulfa Antibiotics Itching    Family History  Problem Relation Age of Onset  . Asthma Mother   . Heart attack Father     Prior to Admission  medications   Medication Sig Start Date End Date Taking? Authorizing Provider  acetaminophen (TYLENOL) 500 MG tablet Take 500 mg by mouth every 6 (six) hours as needed. pain   Yes Historical Provider, MD  albuterol (PROVENTIL HFA;VENTOLIN HFA) 108 (90 BASE) MCG/ACT inhaler Inhale 2 puffs into the lungs every 6 (six) hours as needed. For shortness of breath    Yes Historical Provider, MD  allopurinol (ZYLOPRIM) 300 MG tablet Take 300 mg by mouth 3 (three) times daily.    Yes Historical Provider, MD  carvedilol (COREG) 3.125 MG tablet Take 3.125 mg by mouth 2 (two) times daily with a meal.    Yes Historical Provider, MD  clorazepate (TRANXENE) 7.5 MG tablet Take 7.5 mg by mouth 3 (three) times daily.   Yes Historical Provider, MD  colchicine 0.6 MG tablet Take 0.6 mg by mouth daily.   Yes Historical Provider, MD  donepezil (ARICEPT) 10 MG tablet Take 10 mg by mouth at bedtime.    Yes Historical Provider, MD  furosemide (LASIX) 40 MG tablet Take 40 mg by mouth 2 (two) times daily.    Yes Historical Provider, MD  HYDROcodone-acetaminophen (NORCO/VICODIN) 5-325 MG per tablet Take 1 tablet by mouth every 4 (four) hours as needed for pain.   Yes Historical Provider, MD  loratadine (CLARITIN) 10 MG tablet Take 10 mg by mouth daily as needed. For allergies.   Yes Historical Provider, MD  memantine (NAMENDA) 10 MG tablet Take 10 mg by mouth 2 (two) times daily.    Yes Historical Provider, MD  oxybutynin (DITROPAN) 5 MG tablet Take 5 mg by mouth 3 (three) times daily.     Yes Historical Provider, MD  simvastatin (ZOCOR) 40 MG tablet Take 40 mg by mouth at bedtime.     Yes Historical Provider, MD    Physical Exam: Filed Vitals:   05/26/13 1456 05/26/13 1935 05/26/13 2317  BP: 109/74 114/68   Pulse: 88 84   Temp: 98.3 F (36.8 C)    TempSrc: Oral    Resp: 16 18   Weight:   97.07 kg (214 lb)  SpO2: 98% 95%     Physical Exam  Constitutional: Appears well-developed and well-nourished. No distress.   HENT: Normocephalic. External right and left ear normal. Oropharynx is clear and moist.  Eyes: Conjunctivae and EOM are normal. PERRLA, no scleral icterus.  Neck: Normal ROM. Neck supple. No JVD. No tracheal deviation. No thyromegaly.  CVS: RRR, S1/S2 +, no murmurs, no gallops, no carotid bruit.  Pulmonary: Effort and breath sounds normal, no stridor, scattered rhonchi bilaterally, mild expiratory wheezing  Abdominal: Soft. BS +,  no distension, tenderness, rebound or guarding.  Musculoskeletal: Normal range of motion. No edema and no tenderness.  Lymphadenopathy: No lymphadenopathy noted, cervical, inguinal. Neuro: Alert. Normal reflexes, muscle tone coordination. No cranial nerve deficit. Skin: Skin is warm and dry. No rash noted. Not diaphoretic. No erythema. No pallor.  Psychiatric: Normal mood and affect. Behavior, judgment, thought content normal.   Labs on Admission:  Basic Metabolic Panel:  Recent Labs Lab 05/26/13 1630  NA 138  K 4.1  CL 99  CO2  31  GLUCOSE 87  BUN 26*  CREATININE 1.58*  CALCIUM 9.8   CBC:  Recent Labs Lab 05/26/13 1630  WBC 6.8  NEUTROABS 3.6  HGB 11.0*  HCT 35.8*  MCV 88.2  PLT 336   Cardiac Enzymes:  Recent Labs Lab 05/26/13 1630  TROPONINI <0.30   Radiological Exams on Admission: Dg Chest Portable 1 View  05/26/2013   *RADIOLOGY REPORT*  Clinical Data:  Shoulder pain  PORTABLE CHEST - 1 VIEW  Comparison: Prior chest x-ray 10/07/2012  Findings: Stable appearance of the chest with and atherosclerotic, ectatic and tortuous thoracic aorta.  Stable cardiomegaly. Nonspecific patchy opacity in the right lung base.  No pleural effusion or pneumothorax.  No acute osseous abnormality.  IMPRESSION:  1.  Nonspecific patchy opacity in the right lung base may reflect atelectasis or early developing infiltrate.  2.  Stable cardiomegaly.   Original Report Authenticated By: Malachy Moan, M.D.    EKG: Normal sinus rhythm, no ST/T wave  changes  Debbora Presto, MD  [facility] Pager [phone]  If 7PM-7AM, please contact night-coverage [email or web address] Password [facility] 05/26/2013, 11:25 PM

## 2013-05-26 NOTE — ED Notes (Signed)
Pt is from [facility]

## 2013-05-26 NOTE — ED Provider Notes (Signed)
Katherine Cooper is a 72 y.o. female reports that she had chest pain earlier today, this started insidiously, without trauma. She thinks that she might have slept on her shoulder wrong to cause the discomfort. She also reports that she had shortness of breath. She feels better after narcotic pain medication in the emergency department. Her screening evaluation has been normal. She denies cough dizziness, nausea, or vomiting. Exam: Normal range of motion shoulders. Mild left anterior chest wall tenderness to palpation. Extremities 2+ pitting edema knees to ankles, bilaterally. Normal perfusion and sensation in the feet bilaterally. There are no areas of cellulitis of the extremities. No palpable popliteal cords or calf tenderness.  Assessment: Nonspecific chest pain with mildly abnormal chest x-ray. No clinical evidence for pneumonia. Will screen for pulmonary embolus with d-dimer. She'll be stable for discharge if that is negative. She will be outpatient followup with her PCP, for definitive diagnosis of the chest pain. I do not think that this represents ACS.   Medical screening examination/treatment/procedure(s) were conducted as a shared visit with non-physician practitioner(s) and myself.  I personally evaluated the patient during the encounter  Flint Melter, MD 05/27/13 [phone]

## 2013-05-27 ENCOUNTER — Inpatient Hospital Stay (HOSPITAL_COMMUNITY): Payer: PRIVATE HEALTH INSURANCE

## 2013-05-27 DIAGNOSIS — R0602 Shortness of breath: Secondary | ICD-10-CM

## 2013-05-27 DIAGNOSIS — N184 Chronic kidney disease, stage 4 (severe): Secondary | ICD-10-CM

## 2013-05-27 DIAGNOSIS — G8929 Other chronic pain: Secondary | ICD-10-CM

## 2013-05-27 LAB — CBC
HCT: 33.2 % — ABNORMAL LOW (ref 36.0–46.0)
MCH: 27.1 pg (ref 26.0–34.0)
MCHC: 30.7 g/dL (ref 30.0–36.0)
MCV: 88.3 fL (ref 78.0–100.0)
Platelets: 298 10*3/uL (ref 150–400)
RDW: 14.7 % (ref 11.5–15.5)

## 2013-05-27 LAB — BASIC METABOLIC PANEL
CO2: 26 mEq/L (ref 19–32)
Calcium: 9.4 mg/dL (ref 8.4–10.5)
Creatinine, Ser: 1.41 mg/dL — ABNORMAL HIGH (ref 0.50–1.10)
Glucose, Bld: 103 mg/dL — ABNORMAL HIGH (ref 70–99)

## 2013-05-27 LAB — EXPECTORATED SPUTUM ASSESSMENT W GRAM STAIN, RFLX TO RESP C

## 2013-05-27 LAB — MRSA PCR SCREENING: MRSA by PCR: NEGATIVE

## 2013-05-27 MED ORDER — AMOXICILLIN 500 MG PO CAPS
500.0000 mg | ORAL_CAPSULE | Freq: Three times a day (TID) | ORAL | Status: DC
  Administered 2013-05-27: 500 mg via ORAL
  Filled 2013-05-27 (×3): qty 1

## 2013-05-27 MED ORDER — TECHNETIUM TO 99M ALBUMIN AGGREGATED
5.8000 | Freq: Once | INTRAVENOUS | Status: AC | PRN
  Administered 2013-05-27: 6 via INTRAVENOUS

## 2013-05-27 MED ORDER — HYDROCODONE-ACETAMINOPHEN 5-325 MG PO TABS
1.0000 | ORAL_TABLET | ORAL | Status: DC | PRN
  Administered 2013-05-27: 1 via ORAL
  Filled 2013-05-27: qty 1

## 2013-05-27 MED ORDER — AZITHROMYCIN 500 MG PO TABS: 500.0000 mg | ORAL_TABLET | ORAL | Status: DC

## 2013-05-27 MED ORDER — AMOXICILLIN 500 MG PO CAPS: 500.0000 mg | ORAL_CAPSULE | Freq: Three times a day (TID) | ORAL | Status: DC

## 2013-05-27 MED ORDER — TECHNETIUM TC 99M DIETHYLENETRIAME-PENTAACETIC ACID
44.4000 | Freq: Once | INTRAVENOUS | Status: AC | PRN
  Administered 2013-05-27: 44.4 via INTRAVENOUS

## 2013-05-27 NOTE — Progress Notes (Signed)
Clinical Social Work Department BRIEF PSYCHOSOCIAL ASSESSMENT 05/27/2013  Patient:  Katherine Cooper, Katherine Cooper     Account Number:  [id number]     Admit date:  05/26/2013  Clinical Social Worker:  Orpah Greek  Date/Time:  05/27/2013 03:56 PM  Referred by:  Physician  Date Referred:  05/27/2013 Referred for  Other - See comment   Other Referral:   Admitted from: [facility]   Interview type:  Patient Other interview type:    PSYCHOSOCIAL DATA Living Status:  FACILITY Admitted from facility:  [facility] Level of care:  Assisted Living Primary support name:  Lesleigh Noe (daughter) ph#: [phone] Primary support relationship to patient:  CHILD, ADULT Degree of support available:   good    CURRENT CONCERNS Current Concerns  Post-Acute Placement   Other Concerns:    SOCIAL WORK ASSESSMENT / PLAN CSW spoke with patient re: discharge planning. Patient was admitted from [facility]F and plans to return there at discharge.   Assessment/plan status:  Information/Referral to [facility] Other assessment/ plan:   Information/referral to community resources:   CSW received call from MD that patient will be discharged today, as patient had not been here for 24 hours - no need for FL2. CSW contacted [facility] to ensure that patient is ok to return.    PATIENT'S/FAMILY'S RESPONSE TO PLAN OF CARE: Patient is anxious to return to [facility] today. Facility to transport back, discharge packet in [place].       Unice Bailey, LCSW [facility] Clinical Social Worker cell #: [phone]

## 2013-05-27 NOTE — Evaluation (Signed)
Physical Therapy Evaluation Patient Details Name: Katherine Cooper MRN: [id number] DOB: 25-Jan-1941 Today's Date: 05/27/2013 Time: 1350-1410 PT Time Calculation (min): 20 min  PT Assessment / Plan / Recommendation Clinical Impression  72 yo female admitted with SOB. Pt is from [facility]. On eval, pt was Modified Ind with mobility-able to ambulate ~250 feet with RW. 1x eval. No follow up PT needs. Recommend return to Alf.     PT Assessment  Patent does not need any further PT services    Follow Up Recommendations  No PT follow up    Does the patient have the potential to tolerate intense rehabilitation      Barriers to Discharge        Equipment Recommendations  None recommended by PT    Recommendations for Other Services     Frequency      Precautions / Restrictions Precautions Precautions: None Restrictions Weight Bearing Restrictions: No   Pertinent Vitals/Pain No c/o pain      Mobility  Bed Mobility Bed Mobility: Not assessed Transfers Transfers: Sit to Stand;Stand to Sit Sit to Stand: 6: Modified independent (Device/Increase time);From bed Stand to Sit: 6: Modified independent (Device/Increase time);To bed Ambulation/Gait Ambulation/Gait Assistance: 6: Modified independent (Device/Increase time) Ambulation Distance (Feet): 250 Feet Assistive device: Rolling walker Gait Pattern: Step-through pattern    Exercises     PT Diagnosis:    PT Problem List:   PT Treatment Interventions:     PT Goals    Visit Information  Last PT Received On: 05/27/13 Assistance Needed: +1    Subjective Data  Subjective: It feel good to be on my feet Patient Stated Goal: return to alf   Prior Functioning  Home Living Type of Home: Assisted living ([facility]) Home Access: Elevator;Level entry Home Layout: One level Home Adaptive Equipment: Walker - rolling Prior Function Level of Independence: Independent with assistive device(s) Able to Take Stairs?:  No Driving: No Communication Communication: No difficulties    Cognition  Cognition Arousal/Alertness: Awake/alert Behavior During Therapy: WFL for tasks assessed/performed Overall Cognitive Status: Within Functional Limits for tasks assessed    Extremity/Trunk Assessment Right Lower Extremity Assessment RLE ROM/Strength/Tone: [facility] for tasks assessed Left Lower Extremity Assessment LLE ROM/Strength/Tone: [facility] for tasks assessed Trunk Assessment Trunk Assessment: Normal   Balance    End of Session PT - End of Session Activity Tolerance: Patient tolerated treatment well Patient left: with call bell/phone within reach (sitting EOB)  GP     Rebeca Alert, MPT Pager: [phone]

## 2013-05-27 NOTE — Discharge Summary (Signed)
Physician Discharge Summary  Katherine Cooper ZOX:[id number] DOB: 1941/07/03 DOA: 05/26/2013  PCP: Ron Parker, MD  Admit date: 05/26/2013 Discharge date: 05/27/2013  Time spent: > 35 minutes  Recommendations for Outpatient Follow-up:  1. Please continue to monitor clinical progress for presumed pneumonia.   Discharge Diagnoses:  Principal Problem:   Shortness of breath Active Problems:   Chest pain at rest   PNA (pneumonia)   CKD (chronic kidney disease) stage 4, GFR 15-29 ml/min   Chronic pain   Discharge Condition: stable  Diet recommendation: Heart healthy diet.  Filed Weights   05/26/13 2317 05/27/13 0100  Weight: 97.07 kg (214 lb) 100.336 kg (221 lb 3.2 oz)    History of present illness:  72 y/o with history of Dementia, HTN, stroke that presented to the ED complaining of SOB which was associated with chest discomfort.  Hospital Course:  1. Shortness of breath - Currently breathing comfortably on room air - Elevated D dimer initially as such patient was placed on therapeutic Lovenox.  - V/Q scan showed low probability as such patient will be discharged off of any anticoagulation medication. - SOB presumed to be due to # 2  2. PNA - Was initially suspected based on above symptom and chest xray 1 view which mentioned right lung base opacity suspicious for pneumonia. - WBC within normal limits, non tachypneic - On azithromycin and Rocephin.  Azithromycin currently oral.  Will change Rocephin to amoxicillin 500 mg po tid. Will provide scripts for antibiotics.  3. Chest pain at rest - Troponin x 3 negative - chest pain has resolved - EKG with no ST elevations or depressions. Normal sinus rhythm  4. CKD - baseline creatinine from 1.4-1.5 - currently patient at baseline.  5. Chronic Pain - Stable patient to continue home regimen.   Procedures:  EKG : showed normal sinus rhythm with no sT elevation or depression  Consultations:  none  Discharge Exam: Filed  Vitals:   05/26/13 2317 05/27/13 0018 05/27/13 0100 05/27/13 0622  BP:  124/79 105/69 110/73  Pulse:  91 91 106  Temp:   98.3 F (36.8 C) 99.1 F (37.3 C)  TempSrc:   Oral Oral  Resp:  17 20 18   Height:   5\' 4"  (1.626 m)   Weight: 97.07 kg (214 lb)  100.336 kg (221 lb 3.2 oz)   SpO2:  97% 97% 98%    General: Pt in NAD, Alert and Awake Cardiovascular: RRR, no rubs Respiratory: CTA BL, no wheezes, no rhales, no increased work of breathing  Discharge Instructions  Discharge Orders   Future Orders Complete By Expires     Call MD for:  difficulty breathing, headache or visual disturbances  As directed     Call MD for:  temperature >100.4  As directed     Diet - low sodium heart healthy  As directed     Increase activity slowly  As directed         Medication List    TAKE these medications       acetaminophen 500 MG tablet  Commonly known as:  TYLENOL  Take 500 mg by mouth every 6 (six) hours as needed. pain     albuterol 108 (90 BASE) MCG/ACT inhaler  Commonly known as:  PROVENTIL HFA;VENTOLIN HFA  Inhale 2 puffs into the lungs every 6 (six) hours as needed. For shortness of breath     allopurinol 300 MG tablet  Commonly known as:  ZYLOPRIM  Take 300 mg  by mouth 3 (three) times daily.     amoxicillin 500 MG capsule  Commonly known as:  AMOXIL  Take 1 capsule (500 mg total) by mouth every 8 (eight) hours.     azithromycin 500 MG tablet  Commonly known as:  ZITHROMAX  Take 1 tablet (500 mg total) by mouth daily.     carvedilol 3.125 MG tablet  Commonly known as:  COREG  Take 3.125 mg by mouth 2 (two) times daily with a meal.     clorazepate 7.5 MG tablet  Commonly known as:  TRANXENE  Take 7.5 mg by mouth 3 (three) times daily.     colchicine 0.6 MG tablet  Take 0.6 mg by mouth daily.     donepezil 10 MG tablet  Commonly known as:  ARICEPT  Take 10 mg by mouth at bedtime.     furosemide 40 MG tablet  Commonly known as:  LASIX  Take 40 mg by mouth 2 (two)  times daily.     HYDROcodone-acetaminophen 5-325 MG per tablet  Commonly known as:  NORCO/VICODIN  Take 1 tablet by mouth every 4 (four) hours as needed for pain.     loratadine 10 MG tablet  Commonly known as:  CLARITIN  Take 10 mg by mouth daily as needed. For allergies.     memantine 10 MG tablet  Commonly known as:  NAMENDA  Take 10 mg by mouth 2 (two) times daily.     oxybutynin 5 MG tablet  Commonly known as:  DITROPAN  Take 5 mg by mouth 3 (three) times daily.     simvastatin 40 MG tablet  Commonly known as:  ZOCOR  Take 40 mg by mouth at bedtime.       Allergies  Allergen Reactions  . Sulfa Antibiotics Itching      The results of significant diagnostics from this hospitalization (including imaging, microbiology, ancillary and laboratory) are listed below for reference.    Significant Diagnostic Studies: Nm Pulmonary Perf And Vent  05/27/2013   *RADIOLOGY REPORT*  Clinical Data: Chest pain and shortness of breath.  NM PULMONARY VENTILATION AND PERFUSION SCAN  Radiopharmaceutical: CURIE MAA TECHNETIUM TO 22M ALBUMIN AGGREGATED 44.4 mCi technetium DTPA inhaled.  5.8 mCi technetium MAA IV.  Comparison: Chest x-ray dated 05/26/2013  Findings: There is a single matched small subsegmental ventilation- perfusion defect posteriorly in the right lower lobe. The remainder of the exam is normal.  IMPRESSION: Low probability of pulmonary embolus.   Original Report Authenticated By: Francene Boyers, M.D.   Dg Chest Portable 1 View  05/26/2013   *RADIOLOGY REPORT*  Clinical Data:  Shoulder pain  PORTABLE CHEST - 1 VIEW  Comparison: Prior chest x-ray 10/07/2012  Findings: Stable appearance of the chest with and atherosclerotic, ectatic and tortuous thoracic aorta.  Stable cardiomegaly. Nonspecific patchy opacity in the right lung base.  No pleural effusion or pneumothorax.  No acute osseous abnormality.  IMPRESSION:  1.  Nonspecific patchy opacity in the right lung base may reflect  atelectasis or early developing infiltrate.  2.  Stable cardiomegaly.   Original Report Authenticated By: Malachy Moan, M.D.    Microbiology: Recent Results (from the past 240 hour(s))  CULTURE, EXPECTORATED SPUTUM-ASSESSMENT     Status: None   Collection Time    05/27/13  3:55 AM      Result Value Range Status   Specimen Description SPUTUM   Final   Special Requests NONE   Final   Sputum evaluation  Final   Value: MICROSCOPIC FINDINGS SUGGEST THAT THIS SPECIMEN IS NOT REPRESENTATIVE OF LOWER RESPIRATORY SECRETIONS. PLEASE RECOLLECT.     CALLED TO MILLER,T/4W @0427  ON 05/27/13 BY KARCZEWSKI,S.   Report Status 05/27/2013 FINAL   Final  MRSA PCR SCREENING     Status: None   Collection Time    05/27/13  3:56 AM      Result Value Range Status   MRSA by PCR NEGATIVE  NEGATIVE Final   Comment:            The GeneXpert MRSA Assay (FDA     approved for NASAL specimens     only), is one component of a     comprehensive MRSA colonization     surveillance program. It is not     intended to diagnose MRSA     infection nor to guide or     monitor treatment for     MRSA infections.     Labs: Basic Metabolic Panel:  Recent Labs Lab 05/26/13 1630 05/27/13 0525  NA 138 140  K 4.1 4.1  CL 99 105  CO2 31 26  GLUCOSE 87 103*  BUN 26* 24*  CREATININE 1.58* 1.41*  CALCIUM 9.8 9.4   Liver Function Tests: No results found for this basename: AST, ALT, ALKPHOS, BILITOT, PROT, ALBUMIN,  in the last 168 hours No results found for this basename: LIPASE, AMYLASE,  in the last 168 hours No results found for this basename: AMMONIA,  in the last 168 hours CBC:  Recent Labs Lab 05/26/13 1630 05/27/13 0525  WBC 6.8 6.4  NEUTROABS 3.6  --   HGB 11.0* 10.2*  HCT 35.8* 33.2*  MCV 88.2 88.3  PLT 336 298   Cardiac Enzymes:  Recent Labs Lab 05/26/13 1630 05/27/13 0010 05/27/13 0530  TROPONINI <0.30 <0.30 <0.30   BNP: BNP (last 3 results) No results found for this basename:  PROBNP,  in the last 8760 hours CBG: No results found for this basename: GLUCAP,  in the last 168 hours     Signed:  Penny Pia  [facility] 05/27/2013, 12:44 PM

## 2013-06-02 LAB — CULTURE, BLOOD (ROUTINE X 2)

## 2013-07-03 ENCOUNTER — Encounter (HOSPITAL_COMMUNITY): Payer: Self-pay

## 2013-07-03 ENCOUNTER — Emergency Department (HOSPITAL_COMMUNITY)
Admission: EM | Admit: 2013-07-03 | Discharge: 2013-07-04 | Disposition: A | Discharge status: Home / Self Care | Payer: PRIVATE HEALTH INSURANCE | Attending: Emergency Medicine | Admitting: Emergency Medicine

## 2013-07-03 DIAGNOSIS — Z8709 Personal history of other diseases of the respiratory system: Secondary | ICD-10-CM | POA: Insufficient documentation

## 2013-07-03 DIAGNOSIS — Z8679 Personal history of other diseases of the circulatory system: Secondary | ICD-10-CM | POA: Insufficient documentation

## 2013-07-03 DIAGNOSIS — D649 Anemia, unspecified: Secondary | ICD-10-CM | POA: Insufficient documentation

## 2013-07-03 DIAGNOSIS — N189 Chronic kidney disease, unspecified: Secondary | ICD-10-CM | POA: Insufficient documentation

## 2013-07-03 DIAGNOSIS — Z8739 Personal history of other diseases of the musculoskeletal system and connective tissue: Secondary | ICD-10-CM | POA: Insufficient documentation

## 2013-07-03 DIAGNOSIS — Z87891 Personal history of nicotine dependence: Secondary | ICD-10-CM | POA: Insufficient documentation

## 2013-07-03 DIAGNOSIS — S336XXA Sprain of sacroiliac joint, initial encounter: Secondary | ICD-10-CM | POA: Insufficient documentation

## 2013-07-03 DIAGNOSIS — Z79899 Other long term (current) drug therapy: Secondary | ICD-10-CM | POA: Insufficient documentation

## 2013-07-03 DIAGNOSIS — E669 Obesity, unspecified: Secondary | ICD-10-CM | POA: Insufficient documentation

## 2013-07-03 DIAGNOSIS — Z8639 Personal history of other endocrine, nutritional and metabolic disease: Secondary | ICD-10-CM | POA: Insufficient documentation

## 2013-07-03 DIAGNOSIS — X58XXXA Exposure to other specified factors, initial encounter: Secondary | ICD-10-CM | POA: Insufficient documentation

## 2013-07-03 DIAGNOSIS — Z8701 Personal history of pneumonia (recurrent): Secondary | ICD-10-CM | POA: Insufficient documentation

## 2013-07-03 DIAGNOSIS — M109 Gout, unspecified: Secondary | ICD-10-CM | POA: Insufficient documentation

## 2013-07-03 DIAGNOSIS — S39012A Strain of muscle, fascia and tendon of lower back, initial encounter: Secondary | ICD-10-CM

## 2013-07-03 DIAGNOSIS — Y929 Unspecified place or not applicable: Secondary | ICD-10-CM | POA: Insufficient documentation

## 2013-07-03 DIAGNOSIS — G309 Alzheimer's disease, unspecified: Secondary | ICD-10-CM | POA: Insufficient documentation

## 2013-07-03 DIAGNOSIS — I129 Hypertensive chronic kidney disease with stage 1 through stage 4 chronic kidney disease, or unspecified chronic kidney disease: Secondary | ICD-10-CM | POA: Insufficient documentation

## 2013-07-03 DIAGNOSIS — F0280 Dementia in other diseases classified elsewhere without behavioral disturbance: Secondary | ICD-10-CM | POA: Insufficient documentation

## 2013-07-03 DIAGNOSIS — F411 Generalized anxiety disorder: Secondary | ICD-10-CM | POA: Insufficient documentation

## 2013-07-03 DIAGNOSIS — Z862 Personal history of diseases of the blood and blood-forming organs and certain disorders involving the immune mechanism: Secondary | ICD-10-CM | POA: Insufficient documentation

## 2013-07-03 DIAGNOSIS — R011 Cardiac murmur, unspecified: Secondary | ICD-10-CM | POA: Insufficient documentation

## 2013-07-03 DIAGNOSIS — Z853 Personal history of malignant neoplasm of breast: Secondary | ICD-10-CM | POA: Insufficient documentation

## 2013-07-03 DIAGNOSIS — Y939 Activity, unspecified: Secondary | ICD-10-CM | POA: Insufficient documentation

## 2013-07-03 DIAGNOSIS — Z8673 Personal history of transient ischemic attack (TIA), and cerebral infarction without residual deficits: Secondary | ICD-10-CM | POA: Insufficient documentation

## 2013-07-03 LAB — POCT I-STAT, CHEM 8
BUN: 33 mg/dL — ABNORMAL HIGH (ref 6–23)
Calcium, Ion: 1.23 mmol/L (ref 1.13–1.30)
Chloride: 102 mEq/L (ref 96–112)
Creatinine, Ser: 1.7 mg/dL — ABNORMAL HIGH (ref 0.50–1.10)
TCO2: 28 mmol/L (ref 0–100)

## 2013-07-03 LAB — URINE MICROSCOPIC-ADD ON

## 2013-07-03 LAB — URINALYSIS, ROUTINE W REFLEX MICROSCOPIC
Glucose, UA: NEGATIVE mg/dL
Hgb urine dipstick: NEGATIVE
Ketones, ur: NEGATIVE mg/dL
Protein, ur: NEGATIVE mg/dL
pH: 6.5 (ref 5.0–8.0)

## 2013-07-03 NOTE — ED Notes (Signed)
Pt arrives by EMS from [facility] with c/o's lower abd pain with radiation to flank and lower abd/bladder area-hx UTI/bladder infections in past-taking pain meds with no relief today

## 2013-07-03 NOTE — ED Notes (Signed)
Patient is resting comfortably. 

## 2013-07-03 NOTE — ED Notes (Signed)
Phone call forwarded to patients room.

## 2013-07-03 NOTE — ED Provider Notes (Signed)
History    CSN: [id number] Arrival date & time 07/03/13  2134  First MD Initiated Contact with Patient 07/03/13 2215     Chief Complaint  Patient presents with  . Back Pain   (Consider location/radiation/quality/duration/timing/severity/associated sxs/prior Treatment) HPI Comments: Patient is a 72 y/o female with an extensive PMH who presents from [facility] for low back pain x 3 days. Patient describes the pain as sharp, constant, worse with ambulation and radiates from her R low back to her left low back. She denies alleviating factors.Patient admits to having pain like this in the past which has been associated to her "kidneys acting up" and "kidney infections". Patient denies fevers, N/V, CP, SOB, diarrhea, melena, hematochezia, numbness/tingling, saddle anesthesia, bowel/bladder incontinence, and recent falls or trauma.  Patient is a 72 y.o. female presenting with back pain. The history is provided by the patient. No language interpreter was used.  Back Pain Associated symptoms: no dysuria, no fever, no numbness and no weakness    Past Medical History  Diagnosis Date  . Arthritis   . Hypertension   . Gout   . Obesity   . Angina   . Heart murmur   . Chronic kidney disease   . Stroke   . Recurrent upper respiratory infection (URI)   . Hypothyroidism   . Blood transfusion   . Headache(784.0)   . Anxiety   . Dysrhythmia   . Anemia   . CAP (community acquired pneumonia)   . Alzheimer disease   . Dementia   . Breast cancer 10/22/2012  . Shortness of breath 05/26/2013   Past Surgical History  Procedure Laterality Date  . Uterine fibroid surgery     Family History  Problem Relation Age of Onset  . Asthma Mother   . Heart attack Father    History  Substance Use Topics  . Smoking status: Former Smoker -- 0.25 packs/day    Types: Cigarettes    Quit date: 03/13/2012  . Smokeless tobacco: Not on file  . Alcohol Use: No   OB History   Grav Para Term Preterm  Abortions TAB SAB Ect Mult Living                 Review of Systems  Constitutional: Negative for fever.  Gastrointestinal: Negative for nausea and vomiting.  Genitourinary: Negative for dysuria.  Musculoskeletal: Positive for back pain.  Neurological: Negative for weakness and numbness.  All other systems reviewed and are negative.    Allergies  Sulfa antibiotics  Home Medications   Current Outpatient Rx  Name  Route  Sig  Dispense  Refill  . acetaminophen (TYLENOL) 500 MG tablet   Oral   Take 500 mg by mouth every 6 (six) hours as needed. pain         . albuterol (PROVENTIL HFA;VENTOLIN HFA) 108 (90 BASE) MCG/ACT inhaler   Inhalation   Inhale 2 puffs into the lungs every 6 (six) hours as needed. For shortness of breath          . carvedilol (COREG) 3.125 MG tablet   Oral   Take 3.125 mg by mouth 2 (two) times daily with a meal.          . clorazepate (TRANXENE) 7.5 MG tablet   Oral   Take 7.5 mg by mouth 3 (three) times daily.         . colchicine 0.6 MG tablet   Oral   Take 0.6 mg by mouth daily.         Marland Kitchen  donepezil (ARICEPT) 10 MG tablet   Oral   Take 10 mg by mouth at bedtime.          . ferrous fumarate (HEMOCYTE - 106 MG FE) 325 (106 FE) MG TABS   Oral   Take 1 tablet by mouth daily.         . furosemide (LASIX) 40 MG tablet   Oral   Take 40 mg by mouth 2 (two) times daily.          Marland Kitchen HYDROcodone-acetaminophen (NORCO/VICODIN) 5-325 MG per tablet   Oral   Take 1 tablet by mouth every 4 (four) hours as needed for pain.         Marland Kitchen loratadine (CLARITIN) 10 MG tablet   Oral   Take 10 mg by mouth daily as needed. For allergies.         . memantine (NAMENDA) 10 MG tablet   Oral   Take 10 mg by mouth 2 (two) times daily.          . Multiple Vitamin (MULTIVITAMIN WITH MINERALS) TABS   Oral   Take 1 tablet by mouth daily.         Marland Kitchen oxybutynin (DITROPAN) 5 MG tablet   Oral   Take 5 mg by mouth daily.          . simvastatin  (ZOCOR) 10 MG tablet   Oral   Take 10 mg by mouth at bedtime.          BP 109/74  Pulse 76  Temp(Src) 97.7 F (36.5 C) (Oral)  Resp 16  Ht 5\' 6"  (1.676 m)  Wt 210 lb (95.255 kg)  BMI 33.91 kg/m2  SpO2 99%  Physical Exam  Nursing note and vitals reviewed. Constitutional: She is oriented to person, place, and time. She appears well-developed and well-nourished. No distress.  HENT:  Head: Normocephalic and atraumatic.  Mouth/Throat: Oropharynx is clear and moist. No oropharyngeal exudate.  Eyes: Conjunctivae and EOM are normal. Pupils are equal, round, and reactive to light. No scleral icterus.  Neck: Normal range of motion.  Cardiovascular: Normal rate, regular rhythm and normal heart sounds.   Pulmonary/Chest: Effort normal and breath sounds normal. No respiratory distress. She has no wheezes. She has no rales.  Abdominal: Soft. She exhibits no distension. There is no tenderness. There is no rebound and no guarding.  Musculoskeletal: Normal range of motion. She exhibits tenderness.       Lumbar back: She exhibits tenderness and pain. She exhibits normal range of motion, no bony tenderness, no edema, no deformity, no laceration and no spasm.       Back:  Neurological: She is alert and oriented to person, place, and time.  Skin: Skin is warm and dry. No rash noted. She is not diaphoretic. No erythema. No pallor.  Psychiatric: She has a normal mood and affect. Her behavior is normal.   ED Course  Procedures (including critical care time) Labs Reviewed  URINALYSIS, ROUTINE W REFLEX MICROSCOPIC - Abnormal; Notable for the following:    Leukocytes, UA TRACE (*)    All other components within normal limits  POCT I-STAT, CHEM 8 - Abnormal; Notable for the following:    BUN 33 (*)    Creatinine, Ser 1.70 (*)    Glucose, Bld 104 (*)    Hemoglobin 11.9 (*)    HCT 35.0 (*)    All other components within normal limits  URINE MICROSCOPIC-ADD ON   No results found.  1. Back  strain, initial encounter    MDM  Uncomplicated low back pain. Physical exam as above. Patient states pain feels similar to past UTIs. UA without evidence of infection or hematuria. Kidney function only mildly above baseline. Patient afebrile, well and nontoxic appearing, and hemodynamically stable. Given work up and physical exam, findings most consistent with back strain. Patient appropriate for d/c back to [facility] place; already with Rx for Norco for pain. Indications for ED return discussed with patient who verbalizes comfort and understanding with this d/c plan. Patient discussed with Dr. Norlene Campbell who agrees with patient's work up, assessment, management plan and stability for d/c.  Antony Madura, PA-C 07/08/13 4540

## 2013-07-03 NOTE — ED Notes (Signed)
Patient c/o lower abd pain radiating to flank.

## 2013-07-04 NOTE — ED Notes (Signed)
[facility] called for patient transfer 

## 2013-07-11 NOTE — ED Provider Notes (Signed)
Medical screening examination/treatment/procedure(s) were performed by non-physician practitioner and as supervising physician I was immediately available for consultation/collaboration.  Olivia Mackie, MD 07/11/13 [phone]

## 2013-08-12 ENCOUNTER — Emergency Department (HOSPITAL_COMMUNITY)
Admission: EM | Admit: 2013-08-12 | Discharge: 2013-08-13 | Disposition: A | Discharge status: Home / Self Care | Payer: PRIVATE HEALTH INSURANCE | Attending: Emergency Medicine | Admitting: Emergency Medicine

## 2013-08-12 ENCOUNTER — Encounter (HOSPITAL_COMMUNITY): Payer: Self-pay | Admitting: Emergency Medicine

## 2013-08-12 ENCOUNTER — Emergency Department (HOSPITAL_COMMUNITY): Payer: PRIVATE HEALTH INSURANCE

## 2013-08-12 DIAGNOSIS — F0280 Dementia in other diseases classified elsewhere without behavioral disturbance: Secondary | ICD-10-CM | POA: Insufficient documentation

## 2013-08-12 DIAGNOSIS — Z853 Personal history of malignant neoplasm of breast: Secondary | ICD-10-CM | POA: Insufficient documentation

## 2013-08-12 DIAGNOSIS — Z8701 Personal history of pneumonia (recurrent): Secondary | ICD-10-CM | POA: Insufficient documentation

## 2013-08-12 DIAGNOSIS — IMO0002 Reserved for concepts with insufficient information to code with codable children: Secondary | ICD-10-CM | POA: Insufficient documentation

## 2013-08-12 DIAGNOSIS — D649 Anemia, unspecified: Secondary | ICD-10-CM | POA: Insufficient documentation

## 2013-08-12 DIAGNOSIS — M109 Gout, unspecified: Secondary | ICD-10-CM | POA: Insufficient documentation

## 2013-08-12 DIAGNOSIS — H5710 Ocular pain, unspecified eye: Secondary | ICD-10-CM | POA: Insufficient documentation

## 2013-08-12 DIAGNOSIS — E039 Hypothyroidism, unspecified: Secondary | ICD-10-CM | POA: Insufficient documentation

## 2013-08-12 DIAGNOSIS — Z79899 Other long term (current) drug therapy: Secondary | ICD-10-CM | POA: Insufficient documentation

## 2013-08-12 DIAGNOSIS — R011 Cardiac murmur, unspecified: Secondary | ICD-10-CM | POA: Insufficient documentation

## 2013-08-12 DIAGNOSIS — H579 Unspecified disorder of eye and adnexa: Secondary | ICD-10-CM | POA: Insufficient documentation

## 2013-08-12 DIAGNOSIS — M19079 Primary osteoarthritis, unspecified ankle and foot: Secondary | ICD-10-CM | POA: Insufficient documentation

## 2013-08-12 DIAGNOSIS — Z87891 Personal history of nicotine dependence: Secondary | ICD-10-CM | POA: Insufficient documentation

## 2013-08-12 DIAGNOSIS — Z8639 Personal history of other endocrine, nutritional and metabolic disease: Secondary | ICD-10-CM | POA: Insufficient documentation

## 2013-08-12 DIAGNOSIS — I209 Angina pectoris, unspecified: Secondary | ICD-10-CM | POA: Insufficient documentation

## 2013-08-12 DIAGNOSIS — Z862 Personal history of diseases of the blood and blood-forming organs and certain disorders involving the immune mechanism: Secondary | ICD-10-CM | POA: Insufficient documentation

## 2013-08-12 DIAGNOSIS — M19072 Primary osteoarthritis, left ankle and foot: Secondary | ICD-10-CM

## 2013-08-12 DIAGNOSIS — Z8739 Personal history of other diseases of the musculoskeletal system and connective tissue: Secondary | ICD-10-CM | POA: Insufficient documentation

## 2013-08-12 DIAGNOSIS — H1045 Other chronic allergic conjunctivitis: Secondary | ICD-10-CM | POA: Insufficient documentation

## 2013-08-12 DIAGNOSIS — Z8709 Personal history of other diseases of the respiratory system: Secondary | ICD-10-CM | POA: Insufficient documentation

## 2013-08-12 DIAGNOSIS — H5789 Other specified disorders of eye and adnexa: Secondary | ICD-10-CM | POA: Insufficient documentation

## 2013-08-12 DIAGNOSIS — H1013 Acute atopic conjunctivitis, bilateral: Secondary | ICD-10-CM

## 2013-08-12 DIAGNOSIS — G309 Alzheimer's disease, unspecified: Secondary | ICD-10-CM | POA: Insufficient documentation

## 2013-08-12 DIAGNOSIS — N189 Chronic kidney disease, unspecified: Secondary | ICD-10-CM | POA: Insufficient documentation

## 2013-08-12 DIAGNOSIS — F411 Generalized anxiety disorder: Secondary | ICD-10-CM | POA: Insufficient documentation

## 2013-08-12 DIAGNOSIS — I129 Hypertensive chronic kidney disease with stage 1 through stage 4 chronic kidney disease, or unspecified chronic kidney disease: Secondary | ICD-10-CM | POA: Insufficient documentation

## 2013-08-12 DIAGNOSIS — R6889 Other general symptoms and signs: Secondary | ICD-10-CM | POA: Insufficient documentation

## 2013-08-12 DIAGNOSIS — E669 Obesity, unspecified: Secondary | ICD-10-CM | POA: Insufficient documentation

## 2013-08-12 DIAGNOSIS — Z8679 Personal history of other diseases of the circulatory system: Secondary | ICD-10-CM | POA: Insufficient documentation

## 2013-08-12 DIAGNOSIS — Z8673 Personal history of transient ischemic attack (TIA), and cerebral infarction without residual deficits: Secondary | ICD-10-CM | POA: Insufficient documentation

## 2013-08-12 NOTE — ED Notes (Signed)
Bed: [facility] Expected date:  Expected time:  Means of arrival:  Comments: EMS/72 yo female with eye pain and gout

## 2013-08-12 NOTE — ED Provider Notes (Signed)
CSN: [id number]     Arrival date & time 08/12/13  2149 History     First MD Initiated Contact with Patient 08/12/13 2233     Chief Complaint  Patient presents with  . Foot Pain  . Eye Pain   (Consider location/radiation/quality/duration/timing/severity/associated sxs/prior Treatment) HPI Comments: Pt reports having BL eye itching/ watery d/c for past several weeks.  Was seen by her optometrist and placed on allergy eye gtts which she started yesterday.  She has also had itchy throat, and ears which improved w/ zyrtec.  Pt on cortisporin gtt currently. She also reports L foot pain beginning this morning while showering which she reports is c/w her gout & arthritis.    Patient is a 72 y.o. female presenting with lower extremity pain and eye pain. The history is provided by the patient. No language interpreter was used.  Foot Pain This is a recurrent problem. The current episode started 6 to 12 hours ago. The problem occurs constantly. The problem has not changed since onset.Pertinent negatives include no chest pain, no abdominal pain, no headaches and no shortness of breath. The symptoms are aggravated by standing. The symptoms are relieved by lying down. She has tried nothing for the symptoms. The treatment provided no relief.  Eye Pain This is a recurrent problem. The current episode started more than 1 week ago. The problem occurs constantly. The problem has not changed since onset.Pertinent negatives include no chest pain, no abdominal pain, no headaches and no shortness of breath. Nothing aggravates the symptoms. Nothing relieves the symptoms. Treatments tried: started on new gtt by her optomotrist yesterday. The treatment provided no relief.    Past Medical History  Diagnosis Date  . Arthritis   . Hypertension   . Gout   . Obesity   . Angina   . Heart murmur   . Chronic kidney disease   . Stroke   . Recurrent upper respiratory infection (URI)   . Hypothyroidism   . Blood  transfusion   . Headache(784.0)   . Anxiety   . Dysrhythmia   . Anemia   . CAP (community acquired pneumonia)   . Alzheimer disease   . Dementia   . Breast cancer 10/22/2012  . Shortness of breath 05/26/2013   Past Surgical History  Procedure Laterality Date  . Uterine fibroid surgery     Family History  Problem Relation Age of Onset  . Asthma Mother   . Heart attack Father    History  Substance Use Topics  . Smoking status: Former Smoker -- 0.25 packs/day    Types: Cigarettes    Quit date: 03/13/2012  . Smokeless tobacco: Not on file  . Alcohol Use: No   OB History   Grav Para Term Preterm Abortions TAB SAB Ect Mult Living                 Review of Systems  Constitutional: Negative for fever, chills, diaphoresis, activity change, appetite change and fatigue.  HENT: Negative for congestion, sore throat, facial swelling, rhinorrhea, neck pain and neck stiffness.   Eyes: Positive for discharge, redness and itching. Negative for photophobia.  Respiratory: Negative for cough, chest tightness and shortness of breath.   Cardiovascular: Negative for chest pain, palpitations and leg swelling.  Gastrointestinal: Negative for nausea, vomiting, abdominal pain and diarrhea.  Endocrine: Negative for polydipsia and polyuria.  Genitourinary: Negative for dysuria, frequency, difficulty urinating and pelvic pain.  Musculoskeletal: Positive for arthralgias (L foot pain). Negative for back pain.  Skin: Negative for color change and wound.  Allergic/Immunologic: Negative for immunocompromised state.  Neurological: Negative for facial asymmetry, weakness, numbness and headaches.  Hematological: Does not bruise/bleed easily.  Psychiatric/Behavioral: Negative for confusion and agitation.    Allergies  Sulfa antibiotics  Home Medications   Current Outpatient Rx  Name  Route  Sig  Dispense  Refill  . albuterol (PROVENTIL HFA;VENTOLIN HFA) 108 (90 BASE) MCG/ACT inhaler   Inhalation    Inhale 2 puffs into the lungs every 6 (six) hours as needed. For shortness of breath          . carvedilol (COREG) 3.125 MG tablet   Oral   Take 3.125 mg by mouth 2 (two) times daily with a meal.          . cetirizine (ZYRTEC) 10 MG tablet   Oral   Take 10 mg by mouth daily.         . clorazepate (TRANXENE) 7.5 MG tablet   Oral   Take 7.5 mg by mouth 3 (three) times daily.         . colchicine 0.6 MG tablet   Oral   Take 0.6 mg by mouth daily.         . Cranberry 475 MG CAPS   Oral   Take 475 mg by mouth 2 (two) times daily.         Marland Kitchen donepezil (ARICEPT) 10 MG tablet   Oral   Take 10 mg by mouth at bedtime.          . ferrous fumarate (HEMOCYTE - 106 MG FE) 325 (106 FE) MG TABS   Oral   Take 1 tablet by mouth daily.         . fluticasone (FLONASE) 50 MCG/ACT nasal spray   Nasal   Place 2 sprays into the nose daily.         . furosemide (LASIX) 40 MG tablet   Oral   Take 40 mg by mouth 2 (two) times daily.          Marland Kitchen HYDROcodone-acetaminophen (NORCO/VICODIN) 5-325 MG per tablet   Oral   Take 1 tablet by mouth every 4 (four) hours as needed for pain.         Marland Kitchen loratadine (CLARITIN) 10 MG tablet   Oral   Take 10 mg by mouth daily as needed. For allergies.         . memantine (NAMENDA) 10 MG tablet   Oral   Take 10 mg by mouth 2 (two) times daily.          . Multiple Vitamin (MULTIVITAMIN WITH MINERALS) TABS   Oral   Take 1 tablet by mouth daily.         Marland Kitchen neomycin-polymyxin-hydrocortisone (CORTISPORIN) 3.5-10000-1 ophthalmic suspension   Both Eyes   Place 2 drops into both eyes every 4 (four) hours.         Marland Kitchen oxybutynin (DITROPAN) 5 MG tablet   Oral   Take 5 mg by mouth daily.          . simvastatin (ZOCOR) 10 MG tablet   Oral   Take 10 mg by mouth at bedtime.          BP 104/73  Pulse 78  Temp(Src) 98.7 F (37.1 C) (Oral)  Resp 20  SpO2 98% Physical Exam  Constitutional: She is oriented to person, place, and  time. She appears well-developed and well-nourished. No distress.  HENT:  Head: Normocephalic and atraumatic.  Mouth/Throat: No oropharyngeal exudate.  Eyes: EOM are normal. Pupils are equal, round, and reactive to light. Right eye exhibits exudate (clear). Left eye exudate: clear. Right conjunctiva is injected. Left conjunctiva is injected.  Neck: Normal range of motion. Neck supple.  Cardiovascular: Normal rate, regular rhythm and normal heart sounds.  Exam reveals no gallop and no friction rub.   No murmur heard. Pulmonary/Chest: Effort normal and breath sounds normal. No respiratory distress. She has no wheezes. She has no rales.  Abdominal: Soft. Bowel sounds are normal. She exhibits no distension and no mass. There is no tenderness. There is no rebound and no guarding.  Musculoskeletal: Normal range of motion. She exhibits no edema and no tenderness.       Feet:  ttp over midfoot w/ mild edema.  No warmth, signs of acute trauma.   Neurological: She is alert and oriented to person, place, and time.  Skin: Skin is warm and dry.  Psychiatric: She has a normal mood and affect.    ED Course   Procedures (including critical care time)  Labs Reviewed - No data to display Dg Foot 2 Views Left  08/12/2013   *RADIOLOGY REPORT*  Clinical Data: Left foot pain.  History of gout.  History of arthritis.  LEFT FOOT - 2 VIEW  Comparison: 10/07/2012  Findings: There is diffuse osteopenia with diffuse soft tissue edema in the foot.  No fractures or dislocations.  Minimal arthritic changes of the first metatarsal phalangeal joint.  Slight dorsal spurring at the talonavicular joint with a slight flat foot deformity.  Old healed fracture of the distal second metatarsal, unchanged.  There is cortical irregularity of the distal talus at the articulation with the navicular.  This appears new.  IMPRESSION: Arthritic changes at the talonavicular joint, new since the prior exam.   Original Report Authenticated  By: Francene Boyers, M.D.   1. Arthritis of left foot   2. Allergic conjunctivitis, bilateral     MDM  Pt is a 72 y.o. female with Pmhx as above who presents with BL eye itching, tearing, and L foot pain.  I believe eye symptoms are allergy related given symptoms BL and has associated itchy throat, ears.  Pt on cortisporin, which she can continue. Will also rec outpt f/u with her primary optomotrist.  XR foot ordered and showed no acute changes from her chronic OA.  I do not feel foot infected given lack of erythema, warmth, fever. Pt on colchicine at home, can treat symptomatically w/ ibuprofen for pain.  Return precautions given for new or worsening symptoms including worsening pain, fever, redness.    1. Arthritis of left foot   2. Allergic conjunctivitis, bilateral       Shanna Cisco, MD 08/13/13 1156

## 2013-08-12 NOTE — ED Notes (Addendum)
Pt from [facility], asked to be sent out for gout related foot pain 10/10, ambulatory, beginning this morning after her shower. Pt also reports eye infection to bilateral eyes, taking Cortisporin eye drops since 08/06/13.  Pt was given eye drops, possibly for cataracts.

## 2013-08-13 NOTE — ED Notes (Signed)
[facility] called  

## 2013-09-04 ENCOUNTER — Emergency Department (HOSPITAL_COMMUNITY): Payer: Medicare Other

## 2013-09-04 ENCOUNTER — Emergency Department (HOSPITAL_COMMUNITY)
Admission: EM | Admit: 2013-09-04 | Discharge: 2013-09-05 | Disposition: A | Discharge status: Home / Self Care | Payer: Medicare Other | Attending: Emergency Medicine | Admitting: Emergency Medicine

## 2013-09-04 ENCOUNTER — Encounter (HOSPITAL_COMMUNITY): Payer: Self-pay | Admitting: Emergency Medicine

## 2013-09-04 DIAGNOSIS — Z8739 Personal history of other diseases of the musculoskeletal system and connective tissue: Secondary | ICD-10-CM | POA: Insufficient documentation

## 2013-09-04 DIAGNOSIS — F411 Generalized anxiety disorder: Secondary | ICD-10-CM | POA: Insufficient documentation

## 2013-09-04 DIAGNOSIS — H53149 Visual discomfort, unspecified: Secondary | ICD-10-CM | POA: Insufficient documentation

## 2013-09-04 DIAGNOSIS — Z862 Personal history of diseases of the blood and blood-forming organs and certain disorders involving the immune mechanism: Secondary | ICD-10-CM | POA: Insufficient documentation

## 2013-09-04 DIAGNOSIS — F0280 Dementia in other diseases classified elsewhere without behavioral disturbance: Secondary | ICD-10-CM | POA: Insufficient documentation

## 2013-09-04 DIAGNOSIS — R011 Cardiac murmur, unspecified: Secondary | ICD-10-CM | POA: Insufficient documentation

## 2013-09-04 DIAGNOSIS — Z8673 Personal history of transient ischemic attack (TIA), and cerebral infarction without residual deficits: Secondary | ICD-10-CM | POA: Insufficient documentation

## 2013-09-04 DIAGNOSIS — Z8701 Personal history of pneumonia (recurrent): Secondary | ICD-10-CM | POA: Insufficient documentation

## 2013-09-04 DIAGNOSIS — G309 Alzheimer's disease, unspecified: Secondary | ICD-10-CM | POA: Insufficient documentation

## 2013-09-04 DIAGNOSIS — Z8709 Personal history of other diseases of the respiratory system: Secondary | ICD-10-CM | POA: Insufficient documentation

## 2013-09-04 DIAGNOSIS — Z8639 Personal history of other endocrine, nutritional and metabolic disease: Secondary | ICD-10-CM | POA: Insufficient documentation

## 2013-09-04 DIAGNOSIS — IMO0002 Reserved for concepts with insufficient information to code with codable children: Secondary | ICD-10-CM | POA: Insufficient documentation

## 2013-09-04 DIAGNOSIS — D649 Anemia, unspecified: Secondary | ICD-10-CM | POA: Insufficient documentation

## 2013-09-04 DIAGNOSIS — N189 Chronic kidney disease, unspecified: Secondary | ICD-10-CM | POA: Insufficient documentation

## 2013-09-04 DIAGNOSIS — I209 Angina pectoris, unspecified: Secondary | ICD-10-CM | POA: Insufficient documentation

## 2013-09-04 DIAGNOSIS — I129 Hypertensive chronic kidney disease with stage 1 through stage 4 chronic kidney disease, or unspecified chronic kidney disease: Secondary | ICD-10-CM | POA: Insufficient documentation

## 2013-09-04 DIAGNOSIS — Z79899 Other long term (current) drug therapy: Secondary | ICD-10-CM | POA: Insufficient documentation

## 2013-09-04 DIAGNOSIS — E669 Obesity, unspecified: Secondary | ICD-10-CM | POA: Insufficient documentation

## 2013-09-04 DIAGNOSIS — Z87891 Personal history of nicotine dependence: Secondary | ICD-10-CM | POA: Insufficient documentation

## 2013-09-04 DIAGNOSIS — R51 Headache: Secondary | ICD-10-CM | POA: Insufficient documentation

## 2013-09-04 DIAGNOSIS — Z853 Personal history of malignant neoplasm of breast: Secondary | ICD-10-CM | POA: Insufficient documentation

## 2013-09-04 MED ORDER — ACETAMINOPHEN 500 MG PO TABS
1000.0000 mg | ORAL_TABLET | Freq: Once | ORAL | Status: AC
  Administered 2013-09-04: 1000 mg via ORAL
  Filled 2013-09-04: qty 2

## 2013-09-04 MED ORDER — SODIUM CHLORIDE 0.9 % IV BOLUS (SEPSIS)
500.0000 mL | Freq: Once | INTRAVENOUS | Status: AC
  Administered 2013-09-04: 500 mL via INTRAVENOUS

## 2013-09-04 MED ORDER — DIPHENHYDRAMINE HCL 50 MG/ML IJ SOLN
25.0000 mg | Freq: Once | INTRAMUSCULAR | Status: AC
  Administered 2013-09-04: 25 mg via INTRAVENOUS
  Filled 2013-09-04 (×2): qty 1

## 2013-09-04 MED ORDER — METOCLOPRAMIDE HCL 5 MG/ML IJ SOLN
10.0000 mg | Freq: Once | INTRAMUSCULAR | Status: AC
  Administered 2013-09-04: 10 mg via INTRAMUSCULAR
  Filled 2013-09-04: qty 2

## 2013-09-04 NOTE — ED Provider Notes (Signed)
CSN: [id number]     Arrival date & time 09/04/13  1939 History   First MD Initiated Contact with Patient 09/04/13 1940     Chief Complaint  Patient presents with  . Weakness   (Consider location/radiation/quality/duration/timing/severity/associated sxs/prior Treatment) Patient is a 72 y.o. female presenting with headaches. The history is provided by the patient. No language interpreter was used.  Headache Pain location:  Frontal Quality: throbbing. Radiates to:  Does not radiate Severity currently:  8/10 Severity at highest:  8/10 Onset quality:  Gradual Timing:  Constant Progression:  Worsening Chronicity:  New Similar to prior headaches: no   Context comment:  At rest Relieved by:  Nothing Worsened by:  Light Ineffective treatments:  None tried Associated symptoms: photophobia   Associated symptoms: no abdominal pain, no congestion, no cough, no diarrhea, no fever, no nausea, no neck pain, no sore throat, no syncope and no vomiting     Past Medical History  Diagnosis Date  . Arthritis   . Hypertension   . Gout   . Obesity   . Angina   . Heart murmur   . Chronic kidney disease   . Stroke   . Recurrent upper respiratory infection (URI)   . Hypothyroidism   . Blood transfusion   . Headache(784.0)   . Anxiety   . Dysrhythmia   . Anemia   . CAP (community acquired pneumonia)   . Alzheimer disease   . Dementia   . Breast cancer 10/22/2012  . Shortness of breath 05/26/2013   Past Surgical History  Procedure Laterality Date  . Uterine fibroid surgery     Family History  Problem Relation Age of Onset  . Asthma Mother   . Heart attack Father    History  Substance Use Topics  . Smoking status: Former Smoker -- 0.25 packs/day    Types: Cigarettes    Quit date: 03/13/2012  . Smokeless tobacco: Not on file  . Alcohol Use: No   OB History   Grav Para Term Preterm Abortions TAB SAB Ect Mult Living                 Review of Systems  Constitutional: Negative  for fever.  HENT: Negative for congestion, sore throat, rhinorrhea and neck pain.   Eyes: Positive for photophobia.  Respiratory: Negative for cough and shortness of breath.   Cardiovascular: Negative for chest pain and syncope.  Gastrointestinal: Negative for nausea, vomiting, abdominal pain and diarrhea.  Genitourinary: Negative for dysuria and hematuria.  Skin: Negative for rash.  Neurological: Positive for headaches. Negative for syncope and light-headedness.  All other systems reviewed and are negative.    Allergies  Sulfa antibiotics  Home Medications   Current Outpatient Rx  Name  Route  Sig  Dispense  Refill  . albuterol (PROVENTIL HFA;VENTOLIN HFA) 108 (90 BASE) MCG/ACT inhaler   Inhalation   Inhale 2 puffs into the lungs every 6 (six) hours as needed. For shortness of breath          . carvedilol (COREG) 3.125 MG tablet   Oral   Take 3.125 mg by mouth 2 (two) times daily with a meal.          . cetirizine (ZYRTEC) 10 MG tablet   Oral   Take 10 mg by mouth daily.         . clorazepate (TRANXENE) 7.5 MG tablet   Oral   Take 7.5 mg by mouth 3 (three) times daily.         Marland Kitchen  colchicine 0.6 MG tablet   Oral   Take 0.6 mg by mouth daily.         . Cranberry 475 MG CAPS   Oral   Take 475 mg by mouth 2 (two) times daily.         Marland Kitchen docusate sodium (COLACE) 100 MG capsule   Oral   Take 100 mg by mouth 2 (two) times daily.         Marland Kitchen donepezil (ARICEPT) 10 MG tablet   Oral   Take 10 mg by mouth at bedtime.          . ferrous fumarate (HEMOCYTE - 106 MG FE) 325 (106 FE) MG TABS   Oral   Take 1 tablet by mouth daily.         . fluticasone (FLONASE) 50 MCG/ACT nasal spray   Nasal   Place 2 sprays into the nose daily.         . furosemide (LASIX) 40 MG tablet   Oral   Take 40 mg by mouth 2 (two) times daily.          Marland Kitchen HYDROcodone-acetaminophen (NORCO/VICODIN) 5-325 MG per tablet   Oral   Take 1 tablet by mouth every 4 (four) hours as  needed for pain.         . memantine (NAMENDA) 10 MG tablet   Oral   Take 10 mg by mouth 2 (two) times daily.          . Multiple Vitamin (MULTIVITAMIN WITH MINERALS) TABS   Oral   Take 1 tablet by mouth daily.         Marland Kitchen oxybutynin (DITROPAN) 5 MG tablet   Oral   Take 5 mg by mouth daily.          Bertram Gala Glycol-Propyl Glycol (SYSTANE OP)   Ophthalmic   Apply 1 drop to eye 4 (four) times daily as needed (dry eyes).         . psyllium (METAMUCIL) 58.6 % powder   Oral   Take 1 packet by mouth every morning.         . simvastatin (ZOCOR) 10 MG tablet   Oral   Take 10 mg by mouth at bedtime.         Marland Kitchen tobramycin-dexamethasone (TOBRADEX) ophthalmic solution   Both Eyes   Place 1 drop into both eyes every 4 (four) hours while awake.          BP 108/70  Pulse 96  Temp(Src) 98.6 F (37 C) (Oral)  SpO2 97% Physical Exam  Nursing note and vitals reviewed. Constitutional: She is oriented to person, place, and time. She appears well-developed and well-nourished.  HENT:  Head: Normocephalic and atraumatic.  Right Ear: External ear normal.  Left Ear: External ear normal.  Eyes: EOM are normal. Pupils are equal, round, and reactive to light.  Neck: Normal range of motion. Neck supple.  Cardiovascular: Normal rate, regular rhythm and intact distal pulses.  Exam reveals no gallop and no friction rub.   No murmur heard. Pulmonary/Chest: Effort normal and breath sounds normal. No respiratory distress. She has no wheezes. She has no rales. She exhibits no tenderness.  Abdominal: Soft. Bowel sounds are normal. She exhibits no distension. There is no tenderness. There is no rebound.  Musculoskeletal: Normal range of motion. She exhibits no edema and no tenderness.  Lymphadenopathy:    She has no cervical adenopathy.  Neurological: She is alert and oriented to person, place,  and time.  Skin: Skin is warm. No rash noted.  Psychiatric: She has a normal mood and  affect. Her behavior is normal.    ED Course  Procedures (including critical care time) Labs Review Labs Reviewed  SEDIMENTATION RATE   Imaging Review Ct Head Wo Contrast  09/04/2013   CLINICAL DATA:  Weakness for 2 months. Headache. Eye pain starting this morning. History of migraines.  EXAM: CT HEAD WITHOUT CONTRAST  TECHNIQUE: Contiguous axial images were obtained from the base of the skull through the vertex without intravenous contrast.  COMPARISON:  04/21/2008  FINDINGS: Mild cerebral atrophy. Mild ventricular dilatation consistent with central atrophy. Low attenuation change in the deep white matter consistent with small vessel ischemia. No mass effect or midline shift. No abnormal extra-axial fluid collections. Gray-white matter junctions are indistinct. Basal cisterns are not effaced. No evidence of acute intracranial hemorrhage. Vascular calcifications. No depressed skull fractures. Visualized paranasal sinuses and mastoid air cells are not opacified. No significant changes since previous study.  IMPRESSION: No acute intracranial abnormalities. Mild atrophy and small vessel ischemic change.   Electronically Signed   By: Burman Nieves   On: 09/04/2013 21:51    MDM  No diagnosis found. 8:03 PM Pt is a 72 y.o. female with pertinent PMHX of HTN, GOUT, CKD, CVA, HA who presents to the ED with headache. Pt stated she had a headache frontal behind her eyes with associated photophobia, no phonophobia. History of migraines however this feels different. Pt denies any thunderclap symptoms. No family history of aneurysm. Denies neck pain or fever. Denies URI. Pt denies any weakness. Pt denies chest pain or shortness of breath. No vision loss, no facial droop no slurred speech  On exam, AFVSS. Neurologic exam: Fundoscopic exam: CN I-XII: grossly intact, Sensation: normal in upper and lower extremities, Strength 5/5 in both upper and lower extremities, Coordination intact. Gait normal.  Plan to  give Headache cocktail and obtain CT head wo contrast and an ESR to rule out possible temporal arteritis.  CT head wo contrast showed no intracranial abnormality. On re-eval pt's headache much improved after headache cocktail of 500cc bolus, Reglan, benadryl and 1G tylenol.  If ESR positive plan to start prednisone for possible temporal arteritis. Otherwise if negative ESR. Discharge home with close follow up with PCP.  12:15 AM: I have discussed the diagnosis/risks/treatment options with the patient, family and caregiver and believe the pt to be eligible for discharge home to follow-up with PCP in 1 week. We also discussed returning to the ED immediately if new or worsening sx occur. We discussed the sx which are most concerning (e.g., worsening symptoms) that necessitate immediate return. Any new prescriptions provided to the patient are listed below.   New Prescriptions   No medications on file    The patient appears reasonably screened and/or stabilized for discharge and I doubt any other medical condition or other [facility] requiring further screening, evaluation or treatment in the ED at this time prior to discharge . Pt in agreement with discharge plan. Return precautions given. Pt discharged VSS  Labs, EKG and imaging reviewed by myself and considered in medical decision making if ordered.  Imaging interpreted by radiology. Pt was discussed with my attending, Dr. Alton Revere, MD 09/05/13 [phone]

## 2013-09-04 NOTE — ED Notes (Addendum)
C/o weakness x 2 months, headache, and eye pain started this am. Has history of migraines but states this does not feel like a migraine.

## 2013-09-04 NOTE — ED Notes (Signed)
Pt resides at [facility]

## 2013-09-05 LAB — SEDIMENTATION RATE: Sed Rate: 45 mm/hr — ABNORMAL HIGH (ref 0–22)

## 2013-09-05 MED ORDER — PREDNISONE 10 MG PO TABS: 40.0000 mg | ORAL_TABLET | Freq: Every day | ORAL | Status: DC

## 2013-09-05 MED ORDER — SODIUM CHLORIDE 0.9 % IV SOLN
Freq: Once | INTRAVENOUS | Status: AC
  Administered 2013-09-05: 250 mL via INTRAVENOUS

## 2013-09-05 MED ORDER — PREDNISONE 20 MG PO TABS
40.0000 mg | ORAL_TABLET | Freq: Once | ORAL | Status: AC
  Administered 2013-09-05: 40 mg via ORAL
  Filled 2013-09-05: qty 2

## 2013-09-05 NOTE — ED Provider Notes (Signed)
Care assumed from prior team.  Patient with headache.  Awaiting sedimentation rate.  If normal can the discharged home.  If elevated will need close followup with either ophthalmology or ENT for temporal arteritis with prednisone  Olivia Mackie, MD 09/05/13 [phone]

## 2013-09-05 NOTE — ED Notes (Signed)
Waiting on [facility] to pick up patient and return to [facility].

## 2013-09-05 NOTE — ED Provider Notes (Signed)
I saw and evaluated the patient, reviewed the resident's note and I agree with the findings and plan.  Patient seen for a headache. She denies any visual disturbance. Imaging negative. Workup and rule out for temporal arteritis, followup with primary doctor.  Gilda Crease, MD 09/05/13 [phone]

## 2013-10-11 ENCOUNTER — Encounter (HOSPITAL_COMMUNITY): Payer: Self-pay | Admitting: Emergency Medicine

## 2013-10-11 ENCOUNTER — Observation Stay (HOSPITAL_COMMUNITY)
Admission: EM | Admit: 2013-10-11 | Discharge: 2013-10-13 | Disposition: A | Discharge status: Home / Self Care | Payer: Medicare Other | Attending: Internal Medicine | Admitting: Internal Medicine

## 2013-10-11 DIAGNOSIS — M129 Arthropathy, unspecified: Secondary | ICD-10-CM | POA: Diagnosis not present

## 2013-10-11 DIAGNOSIS — J209 Acute bronchitis, unspecified: Secondary | ICD-10-CM | POA: Diagnosis present

## 2013-10-11 DIAGNOSIS — I252 Old myocardial infarction: Secondary | ICD-10-CM | POA: Insufficient documentation

## 2013-10-11 DIAGNOSIS — I739 Peripheral vascular disease, unspecified: Secondary | ICD-10-CM | POA: Insufficient documentation

## 2013-10-11 DIAGNOSIS — Z882 Allergy status to sulfonamides status: Secondary | ICD-10-CM | POA: Insufficient documentation

## 2013-10-11 DIAGNOSIS — D649 Anemia, unspecified: Secondary | ICD-10-CM | POA: Diagnosis present

## 2013-10-11 DIAGNOSIS — I2510 Atherosclerotic heart disease of native coronary artery without angina pectoris: Secondary | ICD-10-CM | POA: Insufficient documentation

## 2013-10-11 DIAGNOSIS — I10 Essential (primary) hypertension: Secondary | ICD-10-CM | POA: Diagnosis present

## 2013-10-11 DIAGNOSIS — K219 Gastro-esophageal reflux disease without esophagitis: Secondary | ICD-10-CM | POA: Insufficient documentation

## 2013-10-11 DIAGNOSIS — M109 Gout, unspecified: Secondary | ICD-10-CM | POA: Diagnosis not present

## 2013-10-11 DIAGNOSIS — E039 Hypothyroidism, unspecified: Secondary | ICD-10-CM | POA: Diagnosis not present

## 2013-10-11 DIAGNOSIS — G309 Alzheimer's disease, unspecified: Secondary | ICD-10-CM | POA: Insufficient documentation

## 2013-10-11 DIAGNOSIS — J069 Acute upper respiratory infection, unspecified: Secondary | ICD-10-CM | POA: Insufficient documentation

## 2013-10-11 DIAGNOSIS — R002 Palpitations: Secondary | ICD-10-CM | POA: Diagnosis present

## 2013-10-11 DIAGNOSIS — R0789 Other chest pain: Secondary | ICD-10-CM | POA: Diagnosis not present

## 2013-10-11 DIAGNOSIS — Z8701 Personal history of pneumonia (recurrent): Secondary | ICD-10-CM | POA: Insufficient documentation

## 2013-10-11 DIAGNOSIS — I509 Heart failure, unspecified: Secondary | ICD-10-CM | POA: Insufficient documentation

## 2013-10-11 DIAGNOSIS — R011 Cardiac murmur, unspecified: Secondary | ICD-10-CM | POA: Insufficient documentation

## 2013-10-11 DIAGNOSIS — N179 Acute kidney failure, unspecified: Secondary | ICD-10-CM | POA: Diagnosis present

## 2013-10-11 DIAGNOSIS — I209 Angina pectoris, unspecified: Secondary | ICD-10-CM | POA: Insufficient documentation

## 2013-10-11 DIAGNOSIS — J441 Chronic obstructive pulmonary disease with (acute) exacerbation: Secondary | ICD-10-CM | POA: Insufficient documentation

## 2013-10-11 DIAGNOSIS — F0280 Dementia in other diseases classified elsewhere without behavioral disturbance: Secondary | ICD-10-CM | POA: Insufficient documentation

## 2013-10-11 DIAGNOSIS — I499 Cardiac arrhythmia, unspecified: Secondary | ICD-10-CM | POA: Insufficient documentation

## 2013-10-11 DIAGNOSIS — N189 Chronic kidney disease, unspecified: Secondary | ICD-10-CM | POA: Insufficient documentation

## 2013-10-11 DIAGNOSIS — Z87891 Personal history of nicotine dependence: Secondary | ICD-10-CM | POA: Insufficient documentation

## 2013-10-11 DIAGNOSIS — IMO0002 Reserved for concepts with insufficient information to code with codable children: Secondary | ICD-10-CM | POA: Insufficient documentation

## 2013-10-11 DIAGNOSIS — E669 Obesity, unspecified: Secondary | ICD-10-CM | POA: Diagnosis present

## 2013-10-11 DIAGNOSIS — R079 Chest pain, unspecified: Secondary | ICD-10-CM | POA: Diagnosis present

## 2013-10-11 DIAGNOSIS — R600 Localized edema: Secondary | ICD-10-CM

## 2013-10-11 DIAGNOSIS — F411 Generalized anxiety disorder: Secondary | ICD-10-CM | POA: Diagnosis not present

## 2013-10-11 DIAGNOSIS — Z8611 Personal history of tuberculosis: Secondary | ICD-10-CM | POA: Insufficient documentation

## 2013-10-11 DIAGNOSIS — Z7982 Long term (current) use of aspirin: Secondary | ICD-10-CM | POA: Insufficient documentation

## 2013-10-11 DIAGNOSIS — Z79899 Other long term (current) drug therapy: Secondary | ICD-10-CM | POA: Insufficient documentation

## 2013-10-11 DIAGNOSIS — I129 Hypertensive chronic kidney disease with stage 1 through stage 4 chronic kidney disease, or unspecified chronic kidney disease: Secondary | ICD-10-CM | POA: Insufficient documentation

## 2013-10-11 DIAGNOSIS — Z8673 Personal history of transient ischemic attack (TIA), and cerebral infarction without residual deficits: Secondary | ICD-10-CM | POA: Insufficient documentation

## 2013-10-11 DIAGNOSIS — Z853 Personal history of malignant neoplasm of breast: Secondary | ICD-10-CM | POA: Insufficient documentation

## 2013-10-11 DIAGNOSIS — R0600 Dyspnea, unspecified: Secondary | ICD-10-CM

## 2013-10-11 LAB — CBC WITH DIFFERENTIAL/PLATELET
Basophils Absolute: 0.1 10*3/uL (ref 0.0–0.1)
Basophils Relative: 1 % (ref 0–1)
Eosinophils Absolute: 1.2 10*3/uL — ABNORMAL HIGH (ref 0.0–0.7)
MCH: 27.9 pg (ref 26.0–34.0)
MCHC: 32 g/dL (ref 30.0–36.0)
Neutro Abs: 4.2 10*3/uL (ref 1.7–7.7)
Neutrophils Relative %: 52 % (ref 43–77)
RDW: 15.2 % (ref 11.5–15.5)

## 2013-10-11 LAB — POCT I-STAT TROPONIN I

## 2013-10-11 MED ORDER — FUROSEMIDE 10 MG/ML IJ SOLN
40.0000 mg | Freq: Once | INTRAMUSCULAR | Status: AC
  Administered 2013-10-12: 40 mg via INTRAVENOUS
  Filled 2013-10-11: qty 4

## 2013-10-11 MED ORDER — NITROGLYCERIN 0.4 MG SL SUBL: 0.4000 mg | SUBLINGUAL_TABLET | SUBLINGUAL | Status: DC | PRN

## 2013-10-11 MED ORDER — ASPIRIN 325 MG PO TABS: 325.0000 mg | ORAL_TABLET | ORAL | Status: DC

## 2013-10-11 NOTE — ED Notes (Signed)
Patient ambulated to restroom with Tech.

## 2013-10-11 NOTE — ED Provider Notes (Signed)
TIME SEEN: 10:53 PM  CHIEF COMPLAINT: Chest pain, shortness of breath, cough  HPI: Patient is a 72 y.o. female with a history of hypertension, chronic kidney disease, CVA, hypothyroidism, CHF, prior pneumonia who presents emergency department with one week of productive cough with white sputum, intermittent mild chest pressure without radiation, shortness of breath. Denies any aggravating or alleviating factors. She states this feels similar to when she has had prior CHF exacerbations and pneumonia. She states she's also had one week of lower extremity swelling. She feels like she may have the flu but denies any fevers, chills, myalgias. No vomiting or diarrhea.  ROS: See HPI Constitutional: no fever  Eyes: no drainage  ENT: no runny nose   Cardiovascular:   chest pain  Resp: SOB  GI: no vomiting GU: no dysuria Integumentary: no rash  Allergy: no hives  Musculoskeletal: leg swelling  Neurological: no slurred speech ROS otherwise negative  PAST MEDICAL HISTORY/PAST SURGICAL HISTORY:  Past Medical History  Diagnosis Date  . Arthritis   . Hypertension   . Gout   . Obesity   . Angina   . Heart murmur   . Chronic kidney disease   . Stroke   . Recurrent upper respiratory infection (URI)   . Hypothyroidism   . Blood transfusion   . Headache(784.0)   . Anxiety   . Dysrhythmia   . Anemia   . CAP (community acquired pneumonia)   . Alzheimer disease   . Dementia   . Breast cancer 10/22/2012  . Shortness of breath 05/26/2013    MEDICATIONS:  Prior to Admission medications   Medication Sig Start Date End Date Taking? Authorizing Provider  albuterol (PROVENTIL HFA;VENTOLIN HFA) 108 (90 BASE) MCG/ACT inhaler Inhale 2 puffs into the lungs every 6 (six) hours as needed. For shortness of breath    Yes Historical Provider, MD  carvedilol (COREG) 3.125 MG tablet Take 3.125 mg by mouth 2 (two) times daily with a meal.    Yes Historical Provider, MD  cetirizine (ZYRTEC) 10 MG tablet Take 10  mg by mouth daily.   Yes Historical Provider, MD  clorazepate (TRANXENE) 7.5 MG tablet Take 7.5 mg by mouth 3 (three) times daily.   Yes Historical Provider, MD  colchicine 0.6 MG tablet Take 0.6 mg by mouth daily.   Yes Historical Provider, MD  Cranberry 475 MG CAPS Take 475 mg by mouth 2 (two) times daily.   Yes Historical Provider, MD  docusate sodium (COLACE) 100 MG capsule Take 100 mg by mouth 2 (two) times daily.   Yes Historical Provider, MD  donepezil (ARICEPT) 10 MG tablet Take 10 mg by mouth at bedtime.    Yes Historical Provider, MD  ferrous fumarate (HEMOCYTE - 106 MG FE) 325 (106 FE) MG TABS Take 1 tablet by mouth daily.   Yes Historical Provider, MD  fluticasone (FLONASE) 50 MCG/ACT nasal spray Place 2 sprays into the nose daily.   Yes Historical Provider, MD  furosemide (LASIX) 40 MG tablet Take 40 mg by mouth 2 (two) times daily.    Yes Historical Provider, MD  HYDROcodone-acetaminophen (NORCO/VICODIN) 5-325 MG per tablet Take 1 tablet by mouth every 4 (four) hours as needed for pain.   Yes Historical Provider, MD  memantine (NAMENDA) 10 MG tablet Take 10 mg by mouth 2 (two) times daily.    Yes Historical Provider, MD  Multiple Vitamin (MULTIVITAMIN WITH MINERALS) TABS Take 1 tablet by mouth daily.   Yes Historical Provider, MD  nabumetone (RELAFEN)  750 MG tablet Take 750 mg by mouth 2 (two) times daily.   Yes Historical Provider, MD  oxybutynin (DITROPAN) 5 MG tablet Take 5 mg by mouth daily.    Yes Historical Provider, MD  Polyethyl Glycol-Propyl Glycol (SYSTANE OP) Apply 1 drop to eye 4 (four) times daily as needed (dry eyes).   Yes Historical Provider, MD  psyllium (METAMUCIL) 58.6 % powder Take 1 packet by mouth every morning.   Yes Historical Provider, MD  simvastatin (ZOCOR) 10 MG tablet Take 10 mg by mouth at bedtime.   Yes Historical Provider, MD  aspirin 81 MG chewable tablet Chew 324 mg by mouth once.    Historical Provider, MD  nitroGLYCERIN (NITROSTAT) 0.4 MG SL tablet  Place 0.4 mg under the tongue once.    Historical Provider, MD    ALLERGIES:  Allergies  Allergen Reactions  . Sulfa Antibiotics Itching    SOCIAL HISTORY:  History  Substance Use Topics  . Smoking status: Former Smoker -- 0.25 packs/day    Types: Cigarettes    Quit date: 03/13/2012  . Smokeless tobacco: Not on file  . Alcohol Use: No    FAMILY HISTORY: Family History  Problem Relation Age of Onset  . Asthma Mother   . Heart attack Father     EXAM: 97.6 F (36.4 C) 70 18 124/82 mmHg 99 % on RA CONSTITUTIONAL: Alert and oriented and responds appropriately to questions. Well-appearing; well-nourished HEAD: Normocephalic EYES: Conjunctivae clear, PERRL ENT: normal nose; no rhinorrhea; moist mucous membranes; pharynx without lesions noted NECK: Supple, no meningismus, no LAD  CARD: RRR; S1 and S2 appreciated; no murmurs, no clicks, no rubs, no gallops RESP: Normal chest excursion without splinting or tachypnea; patient has mild expiratory wheeze bilaterally, no rhonchi or rales ABD/GI: Normal bowel sounds; non-distended; soft, non-tender, no rebound, no guarding BACK:  The back appears normal and is non-tender to palpation, there is no CVA tenderness EXT: Normal ROM in all joints; non-tender to palpation; 2+ pitting edema to the bilateral lower extremities to the level of the knee; normal capillary refill; no cyanosis    SKIN: Normal color for age and race; warm NEURO: Moves all extremities equally PSYCH: The patient's mood and manner are appropriate. Grooming and personal hygiene are appropriate.  MEDICAL DECISION MAKING: Patient here with chest pain, shortness of breath and cough. She states this feels like her prior pneumonia and CHF exacerbations. Last stress test was in 2011 and documented in anterior septal infarct and EF of 41%. We'll obtain cardiac labs, chest x-ray, EKG.  patient given aspirin with EMS and one nitroglycerin with no change in her pain. Patient is  hemodynamically stable.  ED PROGRESS: Labs are unremarkable. Creatinine is stable at 1.70. Urinalysis shows no sign of infection. Chest x-ray pending. EKG shows no ischemic changes.   Date: 10/12/2013 23:16  Rate: 72  Rhythm: normal sinus rhythm  QRS Axis: normal  Intervals: normal  ST/T Wave abnormalities: normal  Conduction Disutrbances: none  Narrative Interpretation: unremarkable; no ectopy, no ischemic changes     2:01 AM  Pt with continued chest pain.  She is still hemodynamically stable. CXR clear.  PCP is Harvette Jenkins. Will d/w hospitalist.  Layla Maw Zion Lint, DO 10/12/13 [phone]

## 2013-10-11 NOTE — ED Notes (Signed)
Patient arrives via [facility] from [facility] with complaint of  Chest pressure radiating to left posterior shoulder and down left arm. Pain increases with palpation, movement, and deep breathing. Aspirin and SL Nitro given by EMS without effect.

## 2013-10-12 ENCOUNTER — Encounter (HOSPITAL_COMMUNITY): Payer: Self-pay | Admitting: *Deleted

## 2013-10-12 ENCOUNTER — Emergency Department (HOSPITAL_COMMUNITY): Payer: Medicare Other

## 2013-10-12 DIAGNOSIS — I10 Essential (primary) hypertension: Secondary | ICD-10-CM

## 2013-10-12 DIAGNOSIS — R0789 Other chest pain: Secondary | ICD-10-CM | POA: Diagnosis not present

## 2013-10-12 DIAGNOSIS — M109 Gout, unspecified: Secondary | ICD-10-CM

## 2013-10-12 DIAGNOSIS — D649 Anemia, unspecified: Secondary | ICD-10-CM

## 2013-10-12 DIAGNOSIS — R002 Palpitations: Secondary | ICD-10-CM

## 2013-10-12 DIAGNOSIS — J209 Acute bronchitis, unspecified: Secondary | ICD-10-CM | POA: Diagnosis present

## 2013-10-12 DIAGNOSIS — E669 Obesity, unspecified: Secondary | ICD-10-CM

## 2013-10-12 DIAGNOSIS — R0609 Other forms of dyspnea: Secondary | ICD-10-CM

## 2013-10-12 DIAGNOSIS — R0989 Other specified symptoms and signs involving the circulatory and respiratory systems: Secondary | ICD-10-CM

## 2013-10-12 DIAGNOSIS — N179 Acute kidney failure, unspecified: Secondary | ICD-10-CM

## 2013-10-12 DIAGNOSIS — R609 Edema, unspecified: Secondary | ICD-10-CM

## 2013-10-12 DIAGNOSIS — R079 Chest pain, unspecified: Secondary | ICD-10-CM

## 2013-10-12 LAB — CBC
MCHC: 32.7 g/dL (ref 30.0–36.0)
Platelets: 264 10*3/uL (ref 150–400)
RDW: 15.2 % (ref 11.5–15.5)
WBC: 7.7 10*3/uL (ref 4.0–10.5)

## 2013-10-12 LAB — RETICULOCYTES
RBC.: 4.2 MIL/uL (ref 3.87–5.11)
Retic Count, Absolute: 79.8 10*3/uL (ref 19.0–186.0)
Retic Ct Pct: 1.9 % (ref 0.4–3.1)

## 2013-10-12 LAB — BASIC METABOLIC PANEL
BUN: 29 mg/dL — ABNORMAL HIGH (ref 6–23)
BUN: 27 mg/dL — ABNORMAL HIGH (ref 6–23)
Calcium: 9 mg/dL (ref 8.4–10.5)
Calcium: 9.6 mg/dL (ref 8.4–10.5)
Chloride: 100 mEq/L (ref 96–112)
Creatinine, Ser: 1.73 mg/dL — ABNORMAL HIGH (ref 0.50–1.10)
Creatinine, Ser: 1.59 mg/dL — ABNORMAL HIGH (ref 0.50–1.10)
GFR calc Af Amer: 33 mL/min — ABNORMAL LOW (ref >90)
GFR calc Af Amer: 36 mL/min — ABNORMAL LOW (ref >90)
GFR calc non Af Amer: 28 mL/min — ABNORMAL LOW (ref >90)
GFR calc non Af Amer: 31 mL/min — ABNORMAL LOW (ref >90)
Potassium: 3.8 mEq/L (ref 3.5–5.1)

## 2013-10-12 LAB — URINALYSIS, ROUTINE W REFLEX MICROSCOPIC
Bilirubin Urine: NEGATIVE
Glucose, UA: NEGATIVE mg/dL
Ketones, ur: NEGATIVE mg/dL
Leukocytes, UA: NEGATIVE
Nitrite: NEGATIVE
Protein, ur: NEGATIVE mg/dL
pH: 7 (ref 5.0–8.0)

## 2013-10-12 LAB — TROPONIN I
Troponin I: <0.3 ng/mL (ref <0.30)
Troponin I: <0.3 ng/mL (ref <0.30)
Troponin I: <0.3 ng/mL (ref <0.30)

## 2013-10-12 LAB — MRSA PCR SCREENING: MRSA by PCR: POSITIVE — AB

## 2013-10-12 LAB — IRON AND TIBC
Saturation Ratios: 19 % — ABNORMAL LOW (ref 20–55)
UIBC: 259 ug/dL (ref 125–400)

## 2013-10-12 LAB — PRO B NATRIURETIC PEPTIDE: Pro B Natriuretic peptide (BNP): 56.7 pg/mL (ref 0–125)

## 2013-10-12 LAB — URIC ACID: Uric Acid, Serum: 13.6 mg/dL — ABNORMAL HIGH (ref 2.4–7.0)

## 2013-10-12 MED ORDER — HYDROCOD POLST-CHLORPHEN POLST 10-8 MG/5ML PO LQCR: 5.0000 mL | Freq: Two times a day (BID) | ORAL | Status: DC | PRN

## 2013-10-12 MED ORDER — SODIUM CHLORIDE 0.9 % IJ SOLN
3.0000 mL | Freq: Two times a day (BID) | INTRAMUSCULAR | Status: DC
  Administered 2013-10-12 – 2013-10-13 (×3): 3 mL via INTRAVENOUS

## 2013-10-12 MED ORDER — CLORAZEPATE DIPOTASSIUM 3.75 MG PO TABS
7.5000 mg | ORAL_TABLET | Freq: Three times a day (TID) | ORAL | Status: DC
  Administered 2013-10-12 – 2013-10-13 (×5): 7.5 mg via ORAL
  Filled 2013-10-12 (×6): qty 2

## 2013-10-12 MED ORDER — PANTOPRAZOLE SODIUM 40 MG PO TBEC
40.0000 mg | DELAYED_RELEASE_TABLET | Freq: Every day | ORAL | Status: DC
  Administered 2013-10-13: 40 mg via ORAL
  Filled 2013-10-12: qty 1

## 2013-10-12 MED ORDER — ASPIRIN 325 MG PO TABS
325.0000 mg | ORAL_TABLET | Freq: Every day | ORAL | Status: DC
  Administered 2013-10-13: 325 mg via ORAL
  Filled 2013-10-12: qty 1

## 2013-10-12 MED ORDER — OXYCODONE HCL 5 MG PO TABS: 5.0000 mg | ORAL_TABLET | ORAL | Status: DC | PRN

## 2013-10-12 MED ORDER — ZOLPIDEM TARTRATE 5 MG PO TABS: 5.0000 mg | ORAL_TABLET | Freq: Every evening | ORAL | Status: DC | PRN

## 2013-10-12 MED ORDER — FERROUS FUMARATE 325 (106 FE) MG PO TABS
1.0000 | ORAL_TABLET | Freq: Every day | ORAL | Status: DC
  Administered 2013-10-12 – 2013-10-13 (×2): 106 mg via ORAL
  Filled 2013-10-12 (×3): qty 1

## 2013-10-12 MED ORDER — ACETAMINOPHEN 650 MG RE SUPP: 650.0000 mg | Freq: Four times a day (QID) | RECTAL | Status: DC | PRN

## 2013-10-12 MED ORDER — SIMVASTATIN 10 MG PO TABS
10.0000 mg | ORAL_TABLET | Freq: Every day | ORAL | Status: DC
  Administered 2013-10-12: 10 mg via ORAL
  Filled 2013-10-12 (×2): qty 1

## 2013-10-12 MED ORDER — ALBUTEROL SULFATE (5 MG/ML) 0.5% IN NEBU: 2.5000 mg | INHALATION_SOLUTION | RESPIRATORY_TRACT | Status: DC | PRN

## 2013-10-12 MED ORDER — ALBUTEROL SULFATE HFA 108 (90 BASE) MCG/ACT IN AERS: 2.0000 | INHALATION_SPRAY | Freq: Four times a day (QID) | RESPIRATORY_TRACT | Status: DC | PRN

## 2013-10-12 MED ORDER — LEVOFLOXACIN 250 MG PO TABS
250.0000 mg | ORAL_TABLET | Freq: Every day | ORAL | Status: DC
  Administered 2013-10-12 – 2013-10-13 (×2): 250 mg via ORAL
  Filled 2013-10-12 (×3): qty 1

## 2013-10-12 MED ORDER — OXYBUTYNIN CHLORIDE ER 5 MG PO TB24
5.0000 mg | ORAL_TABLET | Freq: Every day | ORAL | Status: DC
  Administered 2013-10-12 – 2013-10-13 (×2): 5 mg via ORAL
  Filled 2013-10-12 (×2): qty 1

## 2013-10-12 MED ORDER — DOCUSATE SODIUM 100 MG PO CAPS
100.0000 mg | ORAL_CAPSULE | Freq: Two times a day (BID) | ORAL | Status: DC
  Administered 2013-10-12 – 2013-10-13 (×3): 100 mg via ORAL
  Filled 2013-10-12 (×4): qty 1

## 2013-10-12 MED ORDER — FUROSEMIDE 40 MG PO TABS
40.0000 mg | ORAL_TABLET | Freq: Two times a day (BID) | ORAL | Status: DC
  Administered 2013-10-12 – 2013-10-13 (×2): 40 mg via ORAL
  Filled 2013-10-12 (×4): qty 1

## 2013-10-12 MED ORDER — NABUMETONE 750 MG PO TABS: 750.0000 mg | ORAL_TABLET | Freq: Two times a day (BID) | ORAL | Status: DC

## 2013-10-12 MED ORDER — SODIUM CHLORIDE 0.9 % IV SOLN
INTRAVENOUS | Status: DC
  Administered 2013-10-12: 05:00:00 via INTRAVENOUS

## 2013-10-12 MED ORDER — ENOXAPARIN SODIUM 40 MG/0.4ML ~~LOC~~ SOLN
40.0000 mg | Freq: Every day | SUBCUTANEOUS | Status: DC
  Administered 2013-10-12 – 2013-10-13 (×2): 40 mg via SUBCUTANEOUS
  Filled 2013-10-12 (×2): qty 0.4

## 2013-10-12 MED ORDER — CRANBERRY 475 MG PO CAPS: 475.0000 mg | ORAL_CAPSULE | Freq: Two times a day (BID) | ORAL | Status: DC

## 2013-10-12 MED ORDER — POLYETHYL GLYCOL-PROPYL GLYCOL 0.4-0.3 % OP SOLN: 1.0000 [drp] | Freq: Four times a day (QID) | OPHTHALMIC | Status: DC

## 2013-10-12 MED ORDER — CHLORHEXIDINE GLUCONATE CLOTH 2 % EX PADS
6.0000 | MEDICATED_PAD | Freq: Every day | CUTANEOUS | Status: DC
  Administered 2013-10-12 – 2013-10-13 (×2): 6 via TOPICAL

## 2013-10-12 MED ORDER — MORPHINE SULFATE 4 MG/ML IJ SOLN
4.0000 mg | Freq: Once | INTRAMUSCULAR | Status: AC
  Administered 2013-10-12: 4 mg via INTRAVENOUS
  Filled 2013-10-12: qty 1

## 2013-10-12 MED ORDER — ADULT MULTIVITAMIN W/MINERALS CH
1.0000 | ORAL_TABLET | Freq: Every day | ORAL | Status: DC
  Administered 2013-10-12 – 2013-10-13 (×2): 1 via ORAL
  Filled 2013-10-12 (×2): qty 1

## 2013-10-12 MED ORDER — ONDANSETRON HCL 4 MG/2ML IJ SOLN: 4.0000 mg | Freq: Four times a day (QID) | INTRAMUSCULAR | Status: DC | PRN

## 2013-10-12 MED ORDER — ALBUTEROL SULFATE (5 MG/ML) 0.5% IN NEBU
2.5000 mg | INHALATION_SOLUTION | Freq: Four times a day (QID) | RESPIRATORY_TRACT | Status: DC
  Administered 2013-10-12: 2.5 mg via RESPIRATORY_TRACT
  Filled 2013-10-12: qty 0.5

## 2013-10-12 MED ORDER — NITROGLYCERIN 0.4 MG SL SUBL: 0.4000 mg | SUBLINGUAL_TABLET | Freq: Once | SUBLINGUAL | Status: DC

## 2013-10-12 MED ORDER — ALUM & MAG HYDROXIDE-SIMETH 200-200-20 MG/5ML PO SUSP: 30.0000 mL | Freq: Four times a day (QID) | ORAL | Status: DC | PRN

## 2013-10-12 MED ORDER — POLYVINYL ALCOHOL 1.4 % OP SOLN
1.0000 [drp] | Freq: Three times a day (TID) | OPHTHALMIC | Status: DC
  Administered 2013-10-12 – 2013-10-13 (×5): 1 [drp] via OPHTHALMIC
  Filled 2013-10-12: qty 15

## 2013-10-12 MED ORDER — ACETAMINOPHEN 325 MG PO TABS: 650.0000 mg | ORAL_TABLET | Freq: Four times a day (QID) | ORAL | Status: DC | PRN

## 2013-10-12 MED ORDER — ONDANSETRON HCL 4 MG PO TABS: 4.0000 mg | ORAL_TABLET | Freq: Four times a day (QID) | ORAL | Status: DC | PRN

## 2013-10-12 MED ORDER — NABUMETONE 750 MG PO TABS
750.0000 mg | ORAL_TABLET | Freq: Two times a day (BID) | ORAL | Status: DC
  Administered 2013-10-12 – 2013-10-13 (×4): 750 mg via ORAL
  Filled 2013-10-12 (×5): qty 1

## 2013-10-12 MED ORDER — MEMANTINE HCL 10 MG PO TABS
10.0000 mg | ORAL_TABLET | Freq: Two times a day (BID) | ORAL | Status: DC
  Administered 2013-10-12 – 2013-10-13 (×3): 10 mg via ORAL
  Filled 2013-10-12 (×4): qty 1

## 2013-10-12 MED ORDER — LORATADINE 10 MG PO TABS
10.0000 mg | ORAL_TABLET | Freq: Every day | ORAL | Status: DC
  Administered 2013-10-12 – 2013-10-13 (×2): 10 mg via ORAL
  Filled 2013-10-12 (×2): qty 1

## 2013-10-12 MED ORDER — FLUTICASONE PROPIONATE 50 MCG/ACT NA SUSP
2.0000 | Freq: Every day | NASAL | Status: DC
  Administered 2013-10-12 – 2013-10-13 (×2): 2 via NASAL
  Filled 2013-10-12: qty 16

## 2013-10-12 MED ORDER — LEVOFLOXACIN 500 MG PO TABS
500.0000 mg | ORAL_TABLET | Freq: Once | ORAL | Status: AC
  Administered 2013-10-12: 500 mg via ORAL
  Filled 2013-10-12: qty 1

## 2013-10-12 MED ORDER — MUPIROCIN 2 % EX OINT
1.0000 "application " | TOPICAL_OINTMENT | Freq: Two times a day (BID) | CUTANEOUS | Status: DC
  Administered 2013-10-12 – 2013-10-13 (×3): 1 via NASAL
  Filled 2013-10-12: qty 22

## 2013-10-12 MED ORDER — HYDROMORPHONE HCL PF 1 MG/ML IJ SOLN
0.5000 mg | INTRAMUSCULAR | Status: DC | PRN
  Administered 2013-10-12: 0.5 mg via INTRAVENOUS
  Filled 2013-10-12: qty 1

## 2013-10-12 MED ORDER — COLCHICINE 0.6 MG PO TABS
0.6000 mg | ORAL_TABLET | Freq: Every day | ORAL | Status: DC
  Administered 2013-10-12 – 2013-10-13 (×2): 0.6 mg via ORAL
  Filled 2013-10-12 (×2): qty 1

## 2013-10-12 MED ORDER — DONEPEZIL HCL 10 MG PO TABS
10.0000 mg | ORAL_TABLET | Freq: Every day | ORAL | Status: DC
  Administered 2013-10-12: 10 mg via ORAL
  Filled 2013-10-12 (×2): qty 1

## 2013-10-12 MED ORDER — CARVEDILOL 3.125 MG PO TABS
3.1250 mg | ORAL_TABLET | Freq: Two times a day (BID) | ORAL | Status: DC
  Administered 2013-10-12 – 2013-10-13 (×4): 3.125 mg via ORAL
  Filled 2013-10-12 (×5): qty 1

## 2013-10-12 MED ORDER — PSYLLIUM 95 % PO PACK
1.0000 | PACK | Freq: Every day | ORAL | Status: DC
  Administered 2013-10-12: 1 via ORAL
  Filled 2013-10-12 (×3): qty 1

## 2013-10-12 NOTE — Progress Notes (Signed)
Notified from lab of positive MRSA PCR. Initiated positive standing orders and placed the patient on contact isolation. Will continue to monitor. Earnest Conroy RN

## 2013-10-12 NOTE — H&P (Signed)
[facility] History and Physical  Katherine Cooper ZOX:[id number] DOB: 1941/10/05 DOA: 10/11/2013  Referring physician:  EDP PCP:  Dr. Florentina Jenny  For the [facility] Specialists:   Chief Complaint:   Chest Pain, Palpitations, and SOB  HPI: Katherine Cooper is a 72 y.o. female  Resident of the [facility] who was sent to the ED with complaints of Intermittent Chest pain radiating into her left arm along with SOB and Palpitations for the past 2 days.    She reports also having a cough and chest congestion and chest tightness for the past 3 days.   She reports subjective fevers and chills.    In the ED , she was evaluated and the first set of troponin markers were negatvie, and her EKG was reported and read by the EDP as non-acute.  She was referred for medical admission.      Review of Systems: The patient denies anorexia, headaches, weight loss, vision loss, diplopia, dizziness, decreased hearing, rhinitis, hoarseness, syncope, dyspnea on exertion, peripheral edema, balance deficits, cough, hemoptysis, abdominal pain, nausea, vomiting, diarrhea, constipation, hematemesis, melena, hematochezia, severe indigestion/heartburn, dysuria, hematuria, incontinence, muscle weakness, suspicious skin lesions, transient blindness, difficulty walking, depression, unusual weight change, abnormal bleeding, enlarged lymph nodes, angioedema, and breast masses.    Past Medical History  Diagnosis Date  . Arthritis   . Hypertension   . Gout   . Obesity   . Angina   . Heart murmur   . Chronic kidney disease   . Stroke   . Recurrent upper respiratory infection (URI)   . Hypothyroidism   . Blood transfusion   . Headache(784.0)   . Anxiety   . Dysrhythmia   . Anemia   . CAP (community acquired pneumonia)   . Alzheimer disease   . Dementia   . Breast cancer 10/22/2012  . Shortness of breath 05/26/2013    Past Surgical History  Procedure Laterality Date  .  Uterine fibroid surgery      Prior to Admission medications   Medication Sig Start Date End Date Taking? Authorizing Provider  albuterol (PROVENTIL HFA;VENTOLIN HFA) 108 (90 BASE) MCG/ACT inhaler Inhale 2 puffs into the lungs every 6 (six) hours as needed. For shortness of breath    Yes Historical Provider, MD  carvedilol (COREG) 3.125 MG tablet Take 3.125 mg by mouth 2 (two) times daily with a meal.    Yes Historical Provider, MD  cetirizine (ZYRTEC) 10 MG tablet Take 10 mg by mouth daily.   Yes Historical Provider, MD  clorazepate (TRANXENE) 7.5 MG tablet Take 7.5 mg by mouth 3 (three) times daily.   Yes Historical Provider, MD  colchicine 0.6 MG tablet Take 0.6 mg by mouth daily.   Yes Historical Provider, MD  Cranberry 475 MG CAPS Take 475 mg by mouth 2 (two) times daily.   Yes Historical Provider, MD  docusate sodium (COLACE) 100 MG capsule Take 100 mg by mouth 2 (two) times daily.   Yes Historical Provider, MD  donepezil (ARICEPT) 10 MG tablet Take 10 mg by mouth at bedtime.    Yes Historical Provider, MD  ferrous fumarate (HEMOCYTE - 106 MG FE) 325 (106 FE) MG TABS Take 1 tablet by mouth daily.   Yes Historical Provider, MD  fluticasone (FLONASE) 50 MCG/ACT nasal spray Place 2 sprays into the nose daily.   Yes Historical Provider, MD  furosemide (LASIX) 40 MG tablet Take 40 mg by mouth 2 (two) times  daily.    Yes Historical Provider, MD  HYDROcodone-acetaminophen (NORCO/VICODIN) 5-325 MG per tablet Take 1 tablet by mouth every 4 (four) hours as needed for pain.   Yes Historical Provider, MD  memantine (NAMENDA) 10 MG tablet Take 10 mg by mouth 2 (two) times daily.    Yes Historical Provider, MD  Multiple Vitamin (MULTIVITAMIN WITH MINERALS) TABS Take 1 tablet by mouth daily.   Yes Historical Provider, MD  nabumetone (RELAFEN) 750 MG tablet Take 750 mg by mouth 2 (two) times daily.   Yes Historical Provider, MD  oxybutynin (DITROPAN) 5 MG tablet Take 5 mg by mouth daily.    Yes Historical  Provider, MD  Polyethyl Glycol-Propyl Glycol (SYSTANE OP) Apply 1 drop to eye 4 (four) times daily as needed (dry eyes).   Yes Historical Provider, MD  psyllium (METAMUCIL) 58.6 % powder Take 1 packet by mouth every morning.   Yes Historical Provider, MD  simvastatin (ZOCOR) 10 MG tablet Take 10 mg by mouth at bedtime.   Yes Historical Provider, MD  aspirin 81 MG chewable tablet Chew 324 mg by mouth once.    Historical Provider, MD  nitroGLYCERIN (NITROSTAT) 0.4 MG SL tablet Place 0.4 mg under the tongue once.    Historical Provider, MD    Allergies  Allergen Reactions  . Sulfa Antibiotics Itching    Social History:  reports that she quit smoking about 19 months ago. Her smoking use included Cigarettes. She smoked 0.25 packs per day. She does not have any smokeless tobacco history on file. She reports that she does not drink alcohol or use illicit drugs.     Family History  Problem Relation Age of Onset  . Asthma Mother   . Heart attack Father        Physical Exam:  GEN:  Pleasant Obese Elderly  72 y.o.  African American female  examined  and in no acute distress; cooperative with exam Filed Vitals:   10/11/13 2345 10/12/13 0000 10/12/13 0121 10/12/13 0258  BP: 107/71 114/77 106/71 110/64  Pulse: 59 64 72 70  Temp:      TempSrc:      Resp: 19 13 15 13   Height:      Weight:      SpO2: 98% 98% 97% 97%   Blood pressure 110/64, pulse 70, temperature 97.6 F (36.4 C), temperature source Oral, resp. rate 13, height 5\' 6"  (1.676 m), weight 97.523 kg (215 lb), SpO2 97.00%. PSYCH: She is alert and oriented x4; does not appear anxious does not appear depressed; affect is normal HEENT: Normocephalic and Atraumatic, Mucous membranes pink; PERRLA; EOM intact; Fundi:  Benign;  No scleral icterus, Nares: Patent, Oropharynx: Clear, Edentulous;  Neck:  FROM, no cervical lymphadenopathy nor thyromegaly or carotid bruit; no JVD; Breasts:: Not examined CHEST WALL: No tenderness CHEST: Normal  respiration, clear to auscultation bilaterally HEART: Regular rate and rhythm; no murmurs rubs or gallops BACK: No kyphosis or scoliosis; no CVA tenderness ABDOMEN: Positive Bowel Sounds,  Obese, soft non-tender; no masses, no organomegaly, no pannus; no intertriginous candida. Rectal Exam: Not done EXTREMITIES: No cyanosis, clubbing or+2+ EDEMA, No ulcerations. Genitalia: not examined PULSES: 2+ and symmetric SKIN: Normal hydration no rash or ulceration CNS: Cranial nerves 2-12 grossly intact no focal neurologic deficit    Labs on Admission:  Basic Metabolic Panel:  Recent Labs Lab 10/11/13 2323  NA 141  K 4.3  CL 103  CO2 28  GLUCOSE 114*  BUN 29*  CREATININE 1.73*  CALCIUM 9.0   Liver Function Tests: No results found for this basename: AST, ALT, ALKPHOS, BILITOT, PROT, ALBUMIN,  in the last 168 hours No results found for this basename: LIPASE, AMYLASE,  in the last 168 hours No results found for this basename: AMMONIA,  in the last 168 hours CBC:  Recent Labs Lab 10/11/13 2323  WBC 8.1  NEUTROABS 4.2  HGB 10.7*  HCT 33.4*  MCV 87.2  PLT 237   Cardiac Enzymes:  Recent Labs Lab 10/12/13 0228  TROPONINI <0.30    BNP (last 3 results)  Recent Labs  10/11/13 2323  PROBNP 56.7   CBG: No results found for this basename: GLUCAP,  in the last 168 hours  Radiological Exams on Admission: Dg Chest 2 View  10/12/2013   CLINICAL DATA:  Chest pain and shortness of breath  EXAM: CHEST  2 VIEW  COMPARISON:  05/26/2013  FINDINGS: Cardiomegaly, chronic. Mediastinal contours distorted by rightward rotation. No acute infiltrate or edema suspected. No effusion or pneumothorax. The lungs appear hyperinflated and there may be COPD.  IMPRESSION: No active cardiopulmonary disease.   Electronically Signed   By: Tiburcio Pea M.D.   On: 10/12/2013 01:38     EKG:   Ordered      Assessment/Plan Principal Problem:   Chest pain Active Problems:   Palpitations   Acute  bronchitis   Bilateral leg edema   Obesity (BMI 30-39.9)   Gout   HTN (hypertension)   Anemia   ARF (acute renal failure)   1.   Chest Pain-  Admitted to telemetry Bed, cycle Troponins, and Placed on Aspirin Rx, NTG SL PRN.     2.   Palpitations- Monitor on Telemetry,  Check TSH levels.     3.   Acute Bronchitis-  PO levaquin, and Albuterol nebs  Ordered and Tussionex BID PRN Cough.    4.   Bilateral Leg Edema-  Chronic,  Continue lasix rx, Chesk TSH level.    5.   Gout - on Colchicine and NSAIDS daily, check Uric Acid level.  May need Allopurinol Rx.     6.   HTN=  Currently low normal BPs on her current meds, Carvedilol.     7.   Anemia- on Iron and MVI Rx, check Anemia panel.    8.   ARF with CKD-  Monitor BUN/Cr trend , gentle rehydration.     9.   Obesity- unchanged.     10.  Dementia-   Continue Aricept and Namenda Rx.    11.  Other- Social Work Consultation for a Different Asst living or Independent Living Center per patient Request.     12.  DVT prophylaxis with Lovenox.       Code Status:    FULL CODE Family Communication:    No Family Present Disposition Plan:     Inpatient  Time spent:  58 Minutes  Ron Parker [facility] Pager [phone]  If 7PM-7AM, please contact night-coverage [email or web address] Password [email or web address] 10/12/2013, 3:14 AM

## 2013-10-12 NOTE — Progress Notes (Signed)
[facility] PROGRESS NOTE  Katherine Cooper ZOX:[id number] DOB: 08-29-1941 DOA: 10/11/2013 PCP: Ron Parker, MD HPI: Katherine Cooper is a 72 y.o. female Resident of the [facility] who was sent to the ED with complaints of Intermittent Chest pain radiating into her left arm along with SOB and Palpitations for the past 2 days. She reports also having a cough and chest congestion and chest tightness for the past 3 days. She reports subjective fevers and chills. In the ED , she was evaluated and the first set of troponin markers were negatvie, and her EKG was reported and read by the EDP as non-acute. She was referred for medical admission.   Assessment/Plan:  1. Chest Pain- Admitted to telemetry Bed, cycle Troponins, and Placed on Aspirin Rx, NTG SL PRN.  2. Palpitations- Monitor on Telemetry,  TSH pending. .  3. Acute Bronchitis- PO levaquin, and Albuterol nebs Ordered and Tussionex BID PRN Cough.  4. Bilateral Leg Edema- Chronic, Continue lasix rx, Chesk TSH level.  5. Gout - on Colchicine and NSAIDS daily, check Uric Acid level. May need Allopurinol Rx.  6. HTN= Currently low normal BPs on her current meds, Carvedilol.  7. Anemia- on Iron and MVI Rx, check Anemia panel.  8. ARF with CKD- Monitor BUN/Cr trend , gentle rehydration.  9. Obesity- unchanged.  10. Dementia- Continue Aricept and Namenda Rx.  11. Other- Social Work Consultation for a Different Asst living or Independent Living Center per patient Request.     Code Status: full code Family Communication: none at bedside Disposition Plan: pending Child psychotherapist consult.    Consultants:  none  Procedures:  none  Antibiotics:  none  HPI/Subjective: Chest pain on deep breaths  Objective: Filed Vitals:   10/12/13 0430  BP: 122/80  Pulse: 73  Temp: 97.9 F (36.6 C)  Resp: 16    Intake/Output Summary (Last 24 hours) at 10/12/13 1239 Last data filed at 10/12/13 0900  Gross per 24  hour  Intake    300 ml  Output      0 ml  Net    300 ml   Filed Weights   10/11/13 2317  Weight: 97.523 kg (215 lb)    Exam:   General:  Alert afebrile comfortable  Cardiovascular: s1s2  Respiratory: ctab  Abdomen: soft NT ND BS+  Musculoskeletal: pedal 2 + edema chronic.   Data Reviewed: Basic Metabolic Panel:  Recent Labs Lab 10/11/13 2323 10/12/13 0850  NA 141 140  K 4.3 3.8  CL 103 100  CO2 28 29  GLUCOSE 114* 147*  BUN 29* 27*  CREATININE 1.73* 1.59*  CALCIUM 9.0 9.6   Liver Function Tests: No results found for this basename: AST, ALT, ALKPHOS, BILITOT, PROT, ALBUMIN,  in the last 168 hours No results found for this basename: LIPASE, AMYLASE,  in the last 168 hours No results found for this basename: AMMONIA,  in the last 168 hours CBC:  Recent Labs Lab 10/11/13 2323 10/12/13 0850  WBC 8.1 7.7  NEUTROABS 4.2  --   HGB 10.7* 12.0  HCT 33.4* 36.7  MCV 87.2 87.4  PLT 237 264   Cardiac Enzymes:  Recent Labs Lab 10/12/13 0228 10/12/13 0850  TROPONINI <0.30 <0.30   BNP (last 3 results)  Recent Labs  10/11/13 2323  PROBNP 56.7   CBG: No results found for this basename: GLUCAP,  in the last 168 hours  Recent Results (from the past 240 hour(s))  MRSA PCR  SCREENING     Status: Abnormal   Collection Time    10/12/13  4:39 AM      Result Value Range Status   MRSA by PCR POSITIVE (*) NEGATIVE Final   Comment:            The GeneXpert MRSA Assay (FDA     approved for NASAL specimens     only), is one component of a     comprehensive MRSA colonization     surveillance program. It is not     intended to diagnose MRSA     infection nor to guide or     monitor treatment for     MRSA infections.     RESULT CALLED TO, READ BACK BY AND VERIFIED WITHSuzie Portela RN [id number] [phone] GREEN R     Studies: Dg Chest 2 View  10/12/2013   CLINICAL DATA:  Chest pain and shortness of breath  EXAM: CHEST  2 VIEW  COMPARISON:  05/26/2013  FINDINGS:  Cardiomegaly, chronic. Mediastinal contours distorted by rightward rotation. No acute infiltrate or edema suspected. No effusion or pneumothorax. The lungs appear hyperinflated and there may be COPD.  IMPRESSION: No active cardiopulmonary disease.   Electronically Signed   By: Tiburcio Pea M.D.   On: 10/12/2013 01:38    Scheduled Meds: . aspirin  325 mg Oral Daily  . carvedilol  3.125 mg Oral BID WC  . Chlorhexidine Gluconate Cloth  6 each Topical Q0600  . clorazepate  7.5 mg Oral TID  . colchicine  0.6 mg Oral Daily  . docusate sodium  100 mg Oral BID  . donepezil  10 mg Oral QHS  . enoxaparin (LOVENOX) injection  40 mg Subcutaneous Daily  . ferrous fumarate  1 tablet Oral Q breakfast  . fluticasone  2 spray Each Nare Daily  . furosemide  40 mg Oral BID  . levofloxacin  250 mg Oral q1800  . loratadine  10 mg Oral Daily  . memantine  10 mg Oral BID  . multivitamin with minerals  1 tablet Oral Daily  . mupirocin ointment  1 application Nasal BID  . nabumetone  750 mg Oral BID  . nitroGLYCERIN  0.4 mg Sublingual Once  . oxybutynin  5 mg Oral Daily  . pantoprazole  40 mg Oral Daily  . polyvinyl alcohol  1 drop Both Eyes TID AC & HS  . psyllium  1 packet Oral Daily  . simvastatin  10 mg Oral QHS  . sodium chloride  3 mL Intravenous Q12H   Continuous Infusions:   Principal Problem:   Chest pain Active Problems:   Bilateral leg edema   Obesity (BMI 30-39.9)   Gout   HTN (hypertension)   Anemia   Palpitations   Acute bronchitis   ARF (acute renal failure)    Time spent: 25 min    Laiba Fuerte  [facility] Pager [phone]. If 7PM-7AM, please contact night-coverage at [email or web address], password [email or web address] 10/12/2013, 12:39 PM  LOS: 1 day

## 2013-10-12 NOTE — Progress Notes (Signed)
PHARMACIST - PHYSICIAN ORDER COMMUNICATION  CONCERNING: P&T Medication Policy on Herbal Medications  DESCRIPTION:  This patient's order for:  Cranberry  has been noted.  This product(s) is classified as an "herbal" or natural product. Due to a lack of definitive safety studies or FDA approval, nonstandard manufacturing practices, plus the potential risk of unknown drug-drug interactions while on inpatient medications, the Pharmacy and Therapeutics Committee does not permit the use of "herbal" or natural products of this type within [facility].   ACTION TAKEN: The pharmacy department is unable to verify this order at this time and your patient has been informed of this safety policy. Please reevaluate patient's clinical condition at discharge and address if the herbal or natural product(s) should be resumed at that time.   

## 2013-10-12 NOTE — ED Notes (Signed)
Placed a bedside commode at bedside as patient is on Lasix.

## 2013-10-13 DIAGNOSIS — R0789 Other chest pain: Secondary | ICD-10-CM | POA: Diagnosis not present

## 2013-10-13 DIAGNOSIS — R609 Edema, unspecified: Secondary | ICD-10-CM

## 2013-10-13 LAB — URIC ACID: Uric Acid, Serum: 13.4 mg/dL — ABNORMAL HIGH (ref 2.4–7.0)

## 2013-10-13 MED ORDER — LEVOFLOXACIN 250 MG PO TABS: 250.0000 mg | ORAL_TABLET | Freq: Every day | ORAL | Status: DC

## 2013-10-13 MED ORDER — PANTOPRAZOLE SODIUM 40 MG PO TBEC: 40.0000 mg | DELAYED_RELEASE_TABLET | Freq: Every day | ORAL | Status: DC

## 2013-10-13 MED ORDER — ASPIRIN 325 MG PO TABS: 325.0000 mg | ORAL_TABLET | Freq: Every day | ORAL | Status: DC

## 2013-10-13 NOTE — Progress Notes (Signed)
CSW (Clinical Child psychotherapist) called facility again to inquire if pt can dc back. CSW spoke with Britta Mccreedy who confirmed pt can dc back. Per pt and facility, CSW was informed that non-emergent ambulance transport may be best. CSW called [facility]. CSW called pt daughter to confirm. [facility] has been arranged. Pt and pt nurse informed. CSW spoke with MD who mentioned pt has an appointment tomorrow and will need transportation. CSW informed facility of this and they are going to try and ensure pt has transportation to the appointment. CSW signing off.  Therese Rocco, LCSWA [phone]

## 2013-10-13 NOTE — Progress Notes (Signed)
D/c orders received;IV removed with gauze on, pt remains in stable condition, pt meds and instructions reviewed and given to pt; pt to be d/c back to ALF, [facility] to take back

## 2013-10-13 NOTE — Discharge Summary (Signed)
Physician Discharge Summary  Katherine Cooper ZOX:[id number] DOB: 08/06/1941 DOA: 10/11/2013  PCP: Ron Parker, MD  Admit date: 10/11/2013 Discharge date: 10/13/2013  Time spent: 25 minutes  Recommendations for Outpatient Follow-up:  1. Follow up withDr Sharyn Lull tomorrow at 3 pm.  2. Follow up with PCP in one week.   Discharge Diagnoses:  Principal Problem:   Chest pain Active Problems:   Bilateral leg edema   Obesity (BMI 30-39.9)   Gout   HTN (hypertension)   Anemia   Palpitations   Acute bronchitis   ARF (acute renal failure)   Discharge Condition: improved  Diet recommendation: low sodium   Filed Weights   10/11/13 2317  Weight: 97.523 kg (215 lb)    History of present illness:    Katherine Cooper is a 72 y.o. female Resident of the [facility] who was sent to the ED with complaints of Intermittent Chest pain radiating into her left arm along with SOB and Palpitations for the past 2 days. She reports also having a cough and chest congestion and chest tightness for the past 3 days. She reports subjective fevers and chills. In the ED , she was evaluated and the first set of troponin markers were negatvie, and her EKG was reported and read by the EDP as non-acute. She was referred for medical admission.     Hospital course:  1. Chest Pain- Admitted to telemetry Bed, cycled Troponins negative. , and Placed on Aspirin Rx, NTG SL PRN. The chest pain is atypical, pleuritic, comes only on deep breaths and she has tenderness at the sternum. She was given pain meds and protonix. Her pain is resolved. soke to Dr Sharyn Lull and she is referred for outpatient follow up tomorrow at 3 pm.  2. Palpitations- resolved. TSH slightly elevated. Follow up with free t4 as outpatient. 3. Acute Bronchitis- PO levaquin, and Albuterol nebs Ordered and Tussionex BID PRN Cough.  4. Bilateral Leg Edema- Chronic, Continue lasix rx,  5. Gout - on Colchicine and NSAIDS daily,  check Uric Acid level. May need Allopurinol Rx.  6. HTN= Currently low normal BPs on her current meds, Carvedilol.  7. Anemia- on Iron and MVI Rx, . 8. ARF with CKD- Monitor BUN/Cr trend , gentle rehydration.  9. Obesity- unchanged.  10. Dementia- Continue Aricept and Namenda Rx.       Procedures:  none  Consultations:  none  Discharge Exam: Filed Vitals:   10/13/13 0822  BP: 117/79  Pulse: 84  Temp:   Resp:     General: alert afebrile comfortable Cardiovascular: s1s2 Respiratory: CTAB  Discharge Instructions  Discharge Orders   Future Orders Complete By Expires   Diet - low sodium heart healthy  As directed    Discharge instructions  As directed    Comments:     Follow up with Dr Sharyn Lull tomorrow at 3 pm.       Medication List    STOP taking these medications       aspirin 81 MG chewable tablet  Replaced by:  aspirin 325 MG tablet      TAKE these medications       albuterol 108 (90 BASE) MCG/ACT inhaler  Commonly known as:  PROVENTIL HFA;VENTOLIN HFA  Inhale 2 puffs into the lungs every 6 (six) hours as needed. For shortness of breath     aspirin 325 MG tablet  Take 1 tablet (325 mg total) by mouth daily.     carvedilol 3.125 MG  tablet  Commonly known as:  COREG  Take 3.125 mg by mouth 2 (two) times daily with a meal.     cetirizine 10 MG tablet  Commonly known as:  ZYRTEC  Take 10 mg by mouth daily.     clorazepate 7.5 MG tablet  Commonly known as:  TRANXENE  Take 7.5 mg by mouth 3 (three) times daily.     colchicine 0.6 MG tablet  Take 0.6 mg by mouth daily.     Cranberry 475 MG Caps  Take 475 mg by mouth 2 (two) times daily.     docusate sodium 100 MG capsule  Commonly known as:  COLACE  Take 100 mg by mouth 2 (two) times daily.     donepezil 10 MG tablet  Commonly known as:  ARICEPT  Take 10 mg by mouth at bedtime.     ferrous fumarate 325 (106 FE) MG Tabs tablet  Commonly known as:  HEMOCYTE - 106 mg FE  Take 1 tablet by  mouth daily.     fluticasone 50 MCG/ACT nasal spray  Commonly known as:  FLONASE  Place 2 sprays into the nose daily.     furosemide 40 MG tablet  Commonly known as:  LASIX  Take 40 mg by mouth 2 (two) times daily.     HYDROcodone-acetaminophen 5-325 MG per tablet  Commonly known as:  NORCO/VICODIN  Take 1 tablet by mouth every 4 (four) hours as needed for pain.     levofloxacin 250 MG tablet  Commonly known as:  LEVAQUIN  Take 1 tablet (250 mg total) by mouth daily at 6 PM.     memantine 10 MG tablet  Commonly known as:  NAMENDA  Take 10 mg by mouth 2 (two) times daily.     multivitamin with minerals Tabs tablet  Take 1 tablet by mouth daily.     nabumetone 750 MG tablet  Commonly known as:  RELAFEN  Take 750 mg by mouth 2 (two) times daily.     nitroGLYCERIN 0.4 MG SL tablet  Commonly known as:  NITROSTAT  Place 0.4 mg under the tongue once.     oxybutynin 5 MG tablet  Commonly known as:  DITROPAN  Take 5 mg by mouth daily.     pantoprazole 40 MG tablet  Commonly known as:  PROTONIX  Take 1 tablet (40 mg total) by mouth daily.     psyllium 58.6 % powder  Commonly known as:  METAMUCIL  Take 1 packet by mouth every morning.     simvastatin 10 MG tablet  Commonly known as:  ZOCOR  Take 10 mg by mouth at bedtime.     SYSTANE OP  Apply 1 drop to eye 4 (four) times daily as needed (dry eyes).       Allergies  Allergen Reactions  . Sulfa Antibiotics Itching       Follow-up Information   Follow up with Ron Parker, MD. Schedule an appointment as soon as possible for a visit in 1 week.   Specialty:  Internal Medicine   Contact information:   [facility] [address] [place] [address]       Follow up with Robynn Pane, MD On 10/14/2013. (at 3 pm. )    Specialty:  Cardiology   Contact information:   [address] [place] [address] [phone]        The results of significant diagnostics  from this hospitalization (including imaging, microbiology, ancillary  and laboratory) are listed below for reference.    Significant Diagnostic Studies: Dg Chest 2 View  10/12/2013   CLINICAL DATA:  Chest pain and shortness of breath  EXAM: CHEST  2 VIEW  COMPARISON:  05/26/2013  FINDINGS: Cardiomegaly, chronic. Mediastinal contours distorted by rightward rotation. No acute infiltrate or edema suspected. No effusion or pneumothorax. The lungs appear hyperinflated and there may be COPD.  IMPRESSION: No active cardiopulmonary disease.   Electronically Signed   By: Tiburcio Pea M.D.   On: 10/12/2013 01:38    Microbiology: Recent Results (from the past 240 hour(s))  MRSA PCR SCREENING     Status: Abnormal   Collection Time    10/12/13  4:39 AM      Result Value Range Status   MRSA by PCR POSITIVE (*) NEGATIVE Final   Comment:            The GeneXpert MRSA Assay (FDA     approved for NASAL specimens     only), is one component of a     comprehensive MRSA colonization     surveillance program. It is not     intended to diagnose MRSA     infection nor to guide or     monitor treatment for     MRSA infections.     RESULT CALLED TO, READ BACK BY AND VERIFIED WITHSuzie Portela RN [phone] GREEN R     Labs: Basic Metabolic Panel:  Recent Labs Lab 10/11/13 2323 10/12/13 0850  NA 141 140  K 4.3 3.8  CL 103 100  CO2 28 29  GLUCOSE 114* 147*  BUN 29* 27*  CREATININE 1.73* 1.59*  CALCIUM 9.0 9.6   Liver Function Tests: No results found for this basename: AST, ALT, ALKPHOS, BILITOT, PROT, ALBUMIN,  in the last 168 hours No results found for this basename: LIPASE, AMYLASE,  in the last 168 hours No results found for this basename: AMMONIA,  in the last 168 hours CBC:  Recent Labs Lab 10/11/13 2323 10/12/13 0850  WBC 8.1 7.7  NEUTROABS 4.2  --   HGB 10.7* 12.0  HCT 33.4* 36.7  MCV 87.2 87.4  PLT 237 264   Cardiac Enzymes:  Recent Labs Lab 10/12/13 0228  10/12/13 0850 10/12/13 1543  TROPONINI <0.30 <0.30 <0.30   BNP: BNP (last 3 results)  Recent Labs  10/11/13 2323  PROBNP 56.7   CBG: No results found for this basename: GLUCAP,  in the last 168 hours     Signed:  Mischa Brittingham  [facility] 10/13/2013, 3:02 PM

## 2013-10-13 NOTE — Progress Notes (Signed)
Clinical Social Work Department BRIEF PSYCHOSOCIAL ASSESSMENT 10/13/2013  Patient:  Katherine Cooper, Katherine Cooper     Account Number:  [id number]     Admit date:  10/11/2013  Clinical Social Worker:  Harless Nakayama  Date/Time:  10/13/2013 11:30 AM  Referred by:  Physician  Date Referred:  10/13/2013 Referred for  ALF Placement   Other Referral:   Interview type:  Patient Other interview type:    PSYCHOSOCIAL DATA Living Status:  FACILITY Admitted from facility:  [facility] Level of care:  Assisted Living Primary support name:   Primary support relationship to patient:   Degree of support available:   Pt reports having good amoutn of support from her children    CURRENT CONCERNS Current Concerns  Post-Acute Placement   Other Concerns:    SOCIAL WORK ASSESSMENT / PLAN CSW informed pt is from a facility but is having issues there. CSW spoke with pt who said she is okay to return back to [facility]. Pt reports she enjoys the facility and is just having problems with her roommate. CSW will speak with facility about this issue but also advice pt to have her children speak with facility as well if the issues continue. Pt was understanding and said her children are already aware. Pt requested ALF list incase she would like to move in the future.    CSW called facility and left voicemail informing that clinicals were being sent over through [facility] and pt is ready for dc today. CSW awaiting return phone call.   Assessment/plan status:  Psychosocial Support/Ongoing Assessment of Needs Other assessment/ plan:   Information/referral to community resources:   ALF list    PATIENT'S/FAMILY'S RESPONSE TO PLAN OF CARE: Pt is agreeable to returning to ALF.       Deandria Klute, LCSWA [phone]

## 2013-10-13 NOTE — Progress Notes (Signed)
CSW (Clinical Child psychotherapist) called facility again to inquire if pt is okay to dc back. CSW was given fax number to send clinicals. CSW awaits response to confirm dc.  Gilma Bessette, LCSWA [phone]

## 2013-10-31 ENCOUNTER — Encounter (HOSPITAL_COMMUNITY): Payer: Self-pay | Admitting: Emergency Medicine

## 2013-10-31 ENCOUNTER — Emergency Department (HOSPITAL_COMMUNITY): Payer: Medicare Other

## 2013-10-31 ENCOUNTER — Emergency Department (HOSPITAL_COMMUNITY)
Admission: EM | Admit: 2013-10-31 | Discharge: 2013-10-31 | Disposition: A | Discharge status: Home / Self Care | Payer: Medicare Other | Source: Home / Self Care | Attending: Emergency Medicine | Admitting: Emergency Medicine

## 2013-10-31 ENCOUNTER — Emergency Department (HOSPITAL_COMMUNITY)
Admission: EM | Admit: 2013-10-31 | Discharge: 2013-11-01 | Disposition: A | Discharge status: Skilled Nursing Facility | Payer: Medicare Other | Attending: Emergency Medicine | Admitting: Emergency Medicine

## 2013-10-31 DIAGNOSIS — F0280 Dementia in other diseases classified elsewhere without behavioral disturbance: Secondary | ICD-10-CM | POA: Insufficient documentation

## 2013-10-31 DIAGNOSIS — Z79899 Other long term (current) drug therapy: Secondary | ICD-10-CM | POA: Insufficient documentation

## 2013-10-31 DIAGNOSIS — Z853 Personal history of malignant neoplasm of breast: Secondary | ICD-10-CM | POA: Insufficient documentation

## 2013-10-31 DIAGNOSIS — M545 Low back pain, unspecified: Secondary | ICD-10-CM | POA: Insufficient documentation

## 2013-10-31 DIAGNOSIS — I129 Hypertensive chronic kidney disease with stage 1 through stage 4 chronic kidney disease, or unspecified chronic kidney disease: Secondary | ICD-10-CM | POA: Insufficient documentation

## 2013-10-31 DIAGNOSIS — M1990 Unspecified osteoarthritis, unspecified site: Secondary | ICD-10-CM | POA: Insufficient documentation

## 2013-10-31 DIAGNOSIS — D649 Anemia, unspecified: Secondary | ICD-10-CM | POA: Insufficient documentation

## 2013-10-31 DIAGNOSIS — Z7982 Long term (current) use of aspirin: Secondary | ICD-10-CM | POA: Insufficient documentation

## 2013-10-31 DIAGNOSIS — F411 Generalized anxiety disorder: Secondary | ICD-10-CM | POA: Insufficient documentation

## 2013-10-31 DIAGNOSIS — M549 Dorsalgia, unspecified: Secondary | ICD-10-CM

## 2013-10-31 DIAGNOSIS — Z8673 Personal history of transient ischemic attack (TIA), and cerebral infarction without residual deficits: Secondary | ICD-10-CM | POA: Insufficient documentation

## 2013-10-31 DIAGNOSIS — R3915 Urgency of urination: Secondary | ICD-10-CM | POA: Insufficient documentation

## 2013-10-31 DIAGNOSIS — N189 Chronic kidney disease, unspecified: Secondary | ICD-10-CM | POA: Insufficient documentation

## 2013-10-31 DIAGNOSIS — Z862 Personal history of diseases of the blood and blood-forming organs and certain disorders involving the immune mechanism: Secondary | ICD-10-CM | POA: Insufficient documentation

## 2013-10-31 DIAGNOSIS — R350 Frequency of micturition: Secondary | ICD-10-CM | POA: Insufficient documentation

## 2013-10-31 DIAGNOSIS — G8929 Other chronic pain: Secondary | ICD-10-CM | POA: Insufficient documentation

## 2013-10-31 DIAGNOSIS — Z8701 Personal history of pneumonia (recurrent): Secondary | ICD-10-CM | POA: Insufficient documentation

## 2013-10-31 DIAGNOSIS — Z8709 Personal history of other diseases of the respiratory system: Secondary | ICD-10-CM | POA: Insufficient documentation

## 2013-10-31 DIAGNOSIS — E669 Obesity, unspecified: Secondary | ICD-10-CM | POA: Insufficient documentation

## 2013-10-31 DIAGNOSIS — R011 Cardiac murmur, unspecified: Secondary | ICD-10-CM | POA: Insufficient documentation

## 2013-10-31 DIAGNOSIS — I209 Angina pectoris, unspecified: Secondary | ICD-10-CM | POA: Insufficient documentation

## 2013-10-31 DIAGNOSIS — G309 Alzheimer's disease, unspecified: Secondary | ICD-10-CM | POA: Insufficient documentation

## 2013-10-31 DIAGNOSIS — Z87891 Personal history of nicotine dependence: Secondary | ICD-10-CM | POA: Insufficient documentation

## 2013-10-31 DIAGNOSIS — Z8639 Personal history of other endocrine, nutritional and metabolic disease: Secondary | ICD-10-CM | POA: Insufficient documentation

## 2013-10-31 DIAGNOSIS — M109 Gout, unspecified: Secondary | ICD-10-CM | POA: Insufficient documentation

## 2013-10-31 HISTORY — DX: Other intervertebral disc degeneration, lumbar region without mention of lumbar back pain or lower extremity pain: M51.369

## 2013-10-31 HISTORY — DX: Other intervertebral disc degeneration, lumbar region: M51.36

## 2013-10-31 LAB — URINALYSIS, ROUTINE W REFLEX MICROSCOPIC
Hgb urine dipstick: NEGATIVE
Ketones, ur: NEGATIVE mg/dL
Nitrite: NEGATIVE
Protein, ur: NEGATIVE mg/dL
Urobilinogen, UA: 0.2 mg/dL (ref 0.0–1.0)

## 2013-10-31 LAB — COMPREHENSIVE METABOLIC PANEL
ALT: 11 U/L (ref 0–35)
Alkaline Phosphatase: 140 U/L — ABNORMAL HIGH (ref 39–117)
BUN: 32 mg/dL — ABNORMAL HIGH (ref 6–23)
CO2: 31 mEq/L (ref 19–32)
Calcium: 10 mg/dL (ref 8.4–10.5)
Chloride: 102 mEq/L (ref 96–112)
Creatinine, Ser: 1.58 mg/dL — ABNORMAL HIGH (ref 0.50–1.10)
GFR calc Af Amer: 37 mL/min — ABNORMAL LOW (ref >90)
GFR calc non Af Amer: 32 mL/min — ABNORMAL LOW (ref >90)
Glucose, Bld: 113 mg/dL — ABNORMAL HIGH (ref 70–99)
Sodium: 143 mEq/L (ref 135–145)

## 2013-10-31 LAB — CBC
HCT: 34.7 % — ABNORMAL LOW (ref 36.0–46.0)
Hemoglobin: 11.2 g/dL — ABNORMAL LOW (ref 12.0–15.0)
MCH: 28.6 pg (ref 26.0–34.0)
MCV: 88.7 fL (ref 78.0–100.0)
RBC: 3.91 MIL/uL (ref 3.87–5.11)
WBC: 9.4 10*3/uL (ref 4.0–10.5)

## 2013-10-31 MED ORDER — MORPHINE SULFATE 4 MG/ML IJ SOLN
4.0000 mg | Freq: Once | INTRAMUSCULAR | Status: AC
  Administered 2013-10-31: 4 mg via INTRAMUSCULAR
  Filled 2013-10-31: qty 1

## 2013-10-31 MED ORDER — MORPHINE SULFATE 4 MG/ML IJ SOLN
4.0000 mg | Freq: Once | INTRAMUSCULAR | Status: AC
  Administered 2013-10-31: 4 mg via INTRAVENOUS
  Filled 2013-10-31: qty 1

## 2013-10-31 NOTE — ED Notes (Signed)
Pt arrives via EMS from [facility] c/o back pain. Pt states she has a hx of gout and arthritis. Pt was given 5/325 norco at 0345 with little relief. Pt asked to be taken to ED for pain management.

## 2013-10-31 NOTE — ED Notes (Signed)
Pt being taken back to [facility] via [place]. Pt given d/c teaching and follow up care instructions. Pt verbalizes understanding and has no further questions upon d/c. [employer] called and spoke with Selena Batten and made aware that pt is on her way back. No further questions. NAD noted upon d/c.

## 2013-10-31 NOTE — ED Notes (Addendum)
Seen here and discharged this MA for back pain. Pt went back to [facility] and pain was still intolerable. Called EMS to come back. PT states" I could not get out of bed all day. I am trembling in my vertebrae. I need an xray of my back to find out what is going on. I have arthritis in my back" denies loss of bladder and bowel. Pain is worse with movement. Pain rated 10/10. Pain located in lower back. "I think it may be kidneys too"

## 2013-10-31 NOTE — ED Notes (Signed)
No answer in waiting.  

## 2013-10-31 NOTE — ED Provider Notes (Signed)
CSN: [id number]     Arrival date & time 10/31/13  0501 History   First MD Initiated Contact with Patient 10/31/13 [phone]     Chief Complaint  Patient presents with  . Back Pain   (Consider location/radiation/quality/duration/timing/severity/associated sxs/prior Treatment) HPI Comments: Patient is a 72 y/o female with an extensive PMHx including arthritis and gout among multiple other medical problems who presents to the ED from [facility] complaining of back pain x 3 days. States she has a history of gout and arthritis and tends to "hurt all over all the time", however her lower back has been worsening over the past 3 days. Pain worse when the air conditioner is on in her room. Admits to associated increased urinary frequency and urgency. She was given norco around 3:45 am today with only slight relief. Denies abdominal pain, fever, chills, n/v, numbness or tingling radiating down extremities, loss of control of bowels/bladder or saddle anesthesia. Also complaining generalized pains in bilateral feet and knees, typical of her "arthritis pain" which pain medication is providing relief.  Patient is a 72 y.o. female presenting with back pain. The history is provided by the patient.  Back Pain   Past Medical History  Diagnosis Date  . Arthritis   . Hypertension   . Gout   . Obesity   . Angina   . Heart murmur   . Chronic kidney disease   . Stroke   . Recurrent upper respiratory infection (URI)   . Hypothyroidism   . Blood transfusion   . Headache(784.0)   . Anxiety   . Dysrhythmia   . Anemia   . CAP (community acquired pneumonia)   . Alzheimer disease   . Dementia   . Breast cancer 10/22/2012  . Shortness of breath 05/26/2013   Past Surgical History  Procedure Laterality Date  . Uterine fibroid surgery     Family History  Problem Relation Age of Onset  . Asthma Mother   . Heart attack Father    History  Substance Use Topics  . Smoking status: Former Smoker -- 0.25  packs/day    Types: Cigarettes    Quit date: 03/13/2012  . Smokeless tobacco: Not on file  . Alcohol Use: No   OB History   Grav Para Term Preterm Abortions TAB SAB Ect Mult Living                 Review of Systems  Genitourinary: Positive for urgency and frequency.  Musculoskeletal: Positive for arthralgias and back pain.  All other systems reviewed and are negative.    Allergies  Sulfa antibiotics  Home Medications   Current Outpatient Rx  Name  Route  Sig  Dispense  Refill  . albuterol (PROVENTIL HFA;VENTOLIN HFA) 108 (90 BASE) MCG/ACT inhaler   Inhalation   Inhale 2 puffs into the lungs every 6 (six) hours as needed. For shortness of breath          . aspirin EC 325 MG tablet   Oral   Take 325 mg by mouth daily.         . carvedilol (COREG) 3.125 MG tablet   Oral   Take 3.125 mg by mouth 2 (two) times daily with a meal.          . cetirizine (ZYRTEC) 10 MG tablet   Oral   Take 10 mg by mouth daily.         . clorazepate (TRANXENE) 7.5 MG tablet   Oral   Take  7.5 mg by mouth 3 (three) times daily.         . colchicine 0.6 MG tablet   Oral   Take 0.6 mg by mouth daily.         . Cranberry 475 MG CAPS   Oral   Take 475 mg by mouth 2 (two) times daily.         Marland Kitchen docusate sodium (COLACE) 100 MG capsule   Oral   Take 100 mg by mouth 2 (two) times daily.         Marland Kitchen donepezil (ARICEPT) 10 MG tablet   Oral   Take 10 mg by mouth at bedtime.          . ferrous sulfate 325 (65 FE) MG tablet   Oral   Take 325 mg by mouth daily with breakfast.         . fluticasone (FLONASE) 50 MCG/ACT nasal spray   Nasal   Place 2 sprays into the nose daily.         . furosemide (LASIX) 40 MG tablet   Oral   Take 40 mg by mouth 2 (two) times daily.          Marland Kitchen HYDROcodone-acetaminophen (NORCO/VICODIN) 5-325 MG per tablet   Oral   Take 1 tablet by mouth every 4 (four) hours as needed for pain.         . Memantine HCl ER (NAMENDA XR) 28 MG  CP24   Oral   Take 1 capsule by mouth daily.         . Multiple Vitamin (MULTIVITAMIN WITH MINERALS) TABS   Oral   Take 1 tablet by mouth daily.         . nitroGLYCERIN (NITROSTAT) 0.4 MG SL tablet   Sublingual   Place 0.4 mg under the tongue once.         Marland Kitchen oxybutynin (DITROPAN) 5 MG tablet   Oral   Take 5 mg by mouth daily.          . pantoprazole (PROTONIX) 40 MG tablet   Oral   Take 1 tablet (40 mg total) by mouth daily.   30 tablet   0   . Polyethyl Glycol-Propyl Glycol (SYSTANE OP)   Ophthalmic   Apply 1 drop to eye 4 (four) times daily as needed (dry eyes).         . psyllium (METAMUCIL) 58.6 % powder   Oral   Take 1 packet by mouth every morning.         . simvastatin (ZOCOR) 10 MG tablet   Oral   Take 10 mg by mouth at bedtime.          BP 98/64  Pulse 83  Temp(Src) 98.9 F (37.2 C) (Oral)  Resp 16  SpO2 98% Physical Exam  Nursing note and vitals reviewed. Constitutional: She is oriented to person, place, and time. She appears well-developed and well-nourished. No distress.  HENT:  Head: Normocephalic and atraumatic.  Mouth/Throat: Oropharynx is clear and moist.  Eyes: Conjunctivae are normal.  Neck: Normal range of motion. Neck supple.  Cardiovascular: Normal rate, regular rhythm, normal heart sounds and intact distal pulses.   Trace pitting edema LE bilateral.  Pulmonary/Chest: Effort normal and breath sounds normal.  Abdominal: Soft. Bowel sounds are normal. She exhibits no distension. There is no tenderness.  Musculoskeletal: Normal range of motion. She exhibits no edema.  Generalized TTP across lower back, no edema. TTP bilateral 1st MTP of feet and  bilateral knees. Mild swelling of MTPs, no erythema, warmth.  Neurological: She is alert and oriented to person, place, and time. She has normal strength. No sensory deficit.  Skin: Skin is warm and dry. She is not diaphoretic.  Psychiatric: She has a normal mood and affect. Her behavior  is normal.    ED Course  Procedures (including critical care time) Labs Review Labs Reviewed  URINALYSIS, ROUTINE W REFLEX MICROSCOPIC - Abnormal; Notable for the following:    APPearance CLOUDY (*)    Leukocytes, UA SMALL (*)    All other components within normal limits  URINE MICROSCOPIC-ADD ON - Abnormal; Notable for the following:    Squamous Epithelial / LPF FEW (*)    Casts HYALINE CASTS (*)    All other components within normal limits   Imaging Review No results found.  EKG Interpretation   None       MDM   1. Back pain   2. Osteoarthritis     Patient with low back pain, generalized arthralgias, increased urinary frequency/urgency. She is well appearing, NAD, VSS, afebrile. Plan to control pain, check UA. 7:12 AM Patient reports improved pain with morphine. UA without infection. She is able to ambulate with assistance in ED (normally uses walker). Stable for discharge home back to [facility]. Return precautions given. Patient states understanding of treatment care plan and is agreeable.   Trevor Mace, PA-C 10/31/13 [phone]

## 2013-10-31 NOTE — ED Provider Notes (Signed)
CSN: [id number]     Arrival date & time 10/31/13  1719 History   First MD Initiated Contact with Patient 10/31/13 1934     Chief Complaint  Patient presents with  . Back Pain   (Consider location/radiation/quality/duration/timing/severity/associated sxs/prior Treatment) HPI Comments: 72 yo aa female presents to ER for cc of LBP.  Pt was seen earlier today in ER for same.  Had UA and was given pain meds with improvement of symptoms.  Pt returned to [facility] and reportedly had worsening pain.  She states she continued to have pain and didn't want to walk to bathroom.  She was sent to ER for evaluations.    Pt denies recent f/c, cough, infection, recent abx, trauma, fall, CP, SOB, n/v/d, f/u/d, hematuria.  She has a h/o chronic pain, osteoarthritis, gout.      Patient is a 72 y.o. female presenting with back pain. The history is provided by the patient and a relative.  Back Pain Location:  Lumbar spine Quality:  Aching Radiates to:  Does not radiate Pain severity:  Severe Pain is:  Worse during the day Onset quality:  Gradual Duration:  3 weeks Timing:  Constant Chronicity:  Chronic Context: not emotional stress, not falling, not jumping from heights, not lifting heavy objects, not MCA, not MVA, not occupational injury and not pedestrian accident   Relieved by:  Nothing Exacerbated by: Morphine given in ER earlier today and pain improved. Associated symptoms: no abdominal swelling, no bladder incontinence, no bowel incontinence, no chest pain, no dysuria, no fever and no headaches   Risk factors: obesity   Risk factors: no hx of cancer, no hx of osteoporosis, no lack of exercise, no menopause, not pregnant, no recent surgery, no steroid use and no vascular disease     Past Medical History  Diagnosis Date  . Arthritis   . Hypertension   . Gout   . Obesity   . Angina   . Heart murmur   . Chronic kidney disease   . Stroke   . Recurrent upper respiratory infection (URI)   .  Hypothyroidism   . Blood transfusion   . Headache(784.0)   . Anxiety   . Dysrhythmia   . Anemia   . CAP (community acquired pneumonia)   . Alzheimer disease   . Dementia   . Shortness of breath 05/26/2013  . DDD (degenerative disc disease), lumbar   . Breast cancer 10/22/2012   Past Surgical History  Procedure Laterality Date  . Uterine fibroid surgery     Family History  Problem Relation Age of Onset  . Asthma Mother   . Heart attack Father    History  Substance Use Topics  . Smoking status: Former Smoker -- 0.25 packs/day    Types: Cigarettes    Quit date: 03/13/2012  . Smokeless tobacco: Not on file  . Alcohol Use: No   OB History   Grav Para Term Preterm Abortions TAB SAB Ect Mult Living                 Review of Systems  Constitutional: Positive for activity change. Negative for fever, chills, diaphoresis, appetite change and fatigue.  HENT: Negative.   Eyes: Negative.   Respiratory: Negative.   Cardiovascular: Negative for chest pain, palpitations and leg swelling.  Gastrointestinal: Negative.  Negative for bowel incontinence.  Genitourinary: Negative for bladder incontinence and dysuria.  Musculoskeletal: Positive for arthralgias, back pain and myalgias. Negative for gait problem, joint swelling, neck pain and  neck stiffness.  Skin: Negative.   Allergic/Immunologic: Negative.   Neurological: Negative.  Negative for headaches.    Allergies  Sulfa antibiotics  Home Medications   Current Outpatient Rx  Name  Route  Sig  Dispense  Refill  . albuterol (PROVENTIL HFA;VENTOLIN HFA) 108 (90 BASE) MCG/ACT inhaler   Inhalation   Inhale 2 puffs into the lungs every 6 (six) hours as needed. For shortness of breath          . aspirin EC 325 MG tablet   Oral   Take 325 mg by mouth daily.         . carvedilol (COREG) 3.125 MG tablet   Oral   Take 3.125 mg by mouth 2 (two) times daily with a meal.          . cetirizine (ZYRTEC) 10 MG tablet   Oral    Take 10 mg by mouth daily.         . clorazepate (TRANXENE) 7.5 MG tablet   Oral   Take 7.5 mg by mouth 3 (three) times daily.         . colchicine 0.6 MG tablet   Oral   Take 0.6 mg by mouth daily.         . Cranberry 475 MG CAPS   Oral   Take 475 mg by mouth 2 (two) times daily.         Marland Kitchen docusate sodium (COLACE) 100 MG capsule   Oral   Take 100 mg by mouth 2 (two) times daily.         Marland Kitchen donepezil (ARICEPT) 10 MG tablet   Oral   Take 10 mg by mouth at bedtime.          . ferrous sulfate 325 (65 FE) MG tablet   Oral   Take 325 mg by mouth daily with breakfast.         . fluticasone (FLONASE) 50 MCG/ACT nasal spray   Nasal   Place 2 sprays into the nose daily.         . furosemide (LASIX) 40 MG tablet   Oral   Take 40 mg by mouth 2 (two) times daily.          Marland Kitchen HYDROcodone-acetaminophen (NORCO/VICODIN) 5-325 MG per tablet   Oral   Take 1 tablet by mouth every 4 (four) hours as needed for pain.         . Memantine HCl ER (NAMENDA XR) 28 MG CP24   Oral   Take 1 capsule by mouth daily.         . Multiple Vitamin (MULTIVITAMIN WITH MINERALS) TABS   Oral   Take 1 tablet by mouth daily.         . nitroGLYCERIN (NITROSTAT) 0.4 MG SL tablet   Sublingual   Place 0.4 mg under the tongue once.         Marland Kitchen oxybutynin (DITROPAN) 5 MG tablet   Oral   Take 5 mg by mouth daily.          . pantoprazole (PROTONIX) 40 MG tablet   Oral   Take 1 tablet (40 mg total) by mouth daily.   30 tablet   0   . Polyethyl Glycol-Propyl Glycol (SYSTANE OP)   Ophthalmic   Apply 1 drop to eye 4 (four) times daily as needed (dry eyes).         . psyllium (METAMUCIL) 58.6 % powder   Oral   Take 1  packet by mouth every morning.         . simvastatin (ZOCOR) 10 MG tablet   Oral   Take 10 mg by mouth at bedtime.          BP 101/61  Pulse 86  Temp(Src) 98.2 F (36.8 C) (Oral)  Resp 16  SpO2 93% Physical Exam  Nursing note and vitals  reviewed. Constitutional: She is oriented to person, place, and time. She appears well-developed and well-nourished.  HENT:  Head: Normocephalic and atraumatic.  Eyes: Conjunctivae are normal.  Neck: Normal range of motion. Neck supple. No JVD present. No thyromegaly present.  Cardiovascular: Normal rate and regular rhythm.  Exam reveals no gallop and no friction rub.   No murmur heard. Pulmonary/Chest: Effort normal and breath sounds normal. No stridor. No respiratory distress. She has no wheezes. She has no rales. She exhibits no tenderness.  Abdominal: Soft. Bowel sounds are normal. She exhibits no distension and no mass. There is no tenderness. There is no rebound and no guarding.  Musculoskeletal: She exhibits tenderness.       Thoracic back: She exhibits tenderness, pain and spasm. She exhibits no bony tenderness, no swelling, no edema, no deformity and no laceration.       Lumbar back: She exhibits tenderness, pain and spasm. She exhibits no bony tenderness, no swelling, no edema and no deformity.  No midline ttp, no point ttp.  Neg SLR.  Neg for paresthesias or paralysis.  Able to move LE with minimal ttp,    Neurological: She is alert and oriented to person, place, and time. She displays normal reflexes. She exhibits normal muscle tone.  Skin: Skin is warm and dry.    ED Course  Procedures (including critical care time) Labs Review Results for orders placed during the hospital encounter of 10/31/13  CBC      Result Value Range   WBC 9.4  4.0 - 10.5 K/uL   RBC 3.91  3.87 - 5.11 MIL/uL   Hemoglobin 11.2 (*) 12.0 - 15.0 g/dL   HCT 91.4 (*) 78.2 - 95.6 %   MCV 88.7  78.0 - 100.0 fL   MCH 28.6  26.0 - 34.0 pg   MCHC 32.3  30.0 - 36.0 g/dL   RDW 21.3  08.6 - 57.8 %   Platelets 248  150 - 400 K/uL  COMPREHENSIVE METABOLIC PANEL      Result Value Range   Sodium 143  135 - 145 mEq/L   Potassium 4.3  3.5 - 5.1 mEq/L   Chloride 102  96 - 112 mEq/L   CO2 31  19 - 32 mEq/L    Glucose, Bld 113 (*) 70 - 99 mg/dL   BUN 32 (*) 6 - 23 mg/dL   Creatinine, Ser 4.69 (*) 0.50 - 1.10 mg/dL   Calcium 62.9  8.4 - 52.8 mg/dL   Total Protein 7.6  6.0 - 8.3 g/dL   Albumin 3.5  3.5 - 5.2 g/dL   AST 15  0 - 37 U/L   ALT 11  0 - 35 U/L   Alkaline Phosphatase 140 (*) 39 - 117 U/L   Total Bilirubin 0.2 (*) 0.3 - 1.2 mg/dL   GFR calc non Af Amer 32 (*) >90 mL/min   GFR calc Af Amer 37 (*) >90 mL/min   IMPRESSION: CT THORACIC SPINE IMPRESSION  1. Moderate degenerative disc disease extending from the T6 through T11 levels without definite foraminal or central canal stenosis. 2. Severe degenerative disc  disease at C6-7 and C7-T1. 3. No acute fracture or listhesis. No focal osseous lesions.  CT LUMBAR SPINE IMPRESSION  1. Grade 1 anterolisthesis of L4 on L5 and L5 on S1 with severe degenerative disc disease at these levels, most severe at L5-S1. Further evaluation with MRI could be performed for more detailed evaluation as clinically indicated. 2. Severe bilateral facet arthrosis at L4-5 and L5-S1. 3. No acute fracture listhesis. No focal osseous lesions.   Electronically Signed By: Rise Mu M.D. On: 10/31/2013 22:14           MDM  No diagnosis found. 72 year old aggregate female presents to emergency department for the second time today for lower back pain. Patient has a history of chronic lower back pain due to arthritis. This morning a urinalysis was obtained and the patient was discharged after pain medications were given. Patient denies any neurologic deficits, bowel bladder incontinence or retention, recent trauma, history of cancer, hematuria, or dysuria. ER workup this evening is unremarkable to include CBC, CMP, and CT T and L spine.  At this point there is no indication for further imaging, lab work, or inpatient management. I suspect this patient's symptoms are due to chronic arthritic changes. She needs to continue with her current pain  medications and followup with primary care provider or pain management as an outpatient. I had a lengthy discussion with the patient and her daughter regarding these recommendations.  ER precautions given.  Pt to f/u with PCM.  Pt and daughter agree with plan.      Darlys Gales, MD 10/31/13 2255

## 2013-10-31 NOTE — ED Notes (Signed)
No answer when called for triage x1 

## 2013-11-01 ENCOUNTER — Emergency Department (HOSPITAL_COMMUNITY)
Admission: EM | Admit: 2013-11-01 | Discharge: 2013-11-02 | Disposition: A | Discharge status: Home / Self Care | Payer: Medicare Other | Attending: Emergency Medicine | Admitting: Emergency Medicine

## 2013-11-01 ENCOUNTER — Encounter (HOSPITAL_COMMUNITY): Payer: Self-pay | Admitting: Emergency Medicine

## 2013-11-01 DIAGNOSIS — M109 Gout, unspecified: Secondary | ICD-10-CM | POA: Insufficient documentation

## 2013-11-01 DIAGNOSIS — R011 Cardiac murmur, unspecified: Secondary | ICD-10-CM | POA: Insufficient documentation

## 2013-11-01 DIAGNOSIS — G309 Alzheimer's disease, unspecified: Secondary | ICD-10-CM | POA: Insufficient documentation

## 2013-11-01 DIAGNOSIS — Z7982 Long term (current) use of aspirin: Secondary | ICD-10-CM | POA: Insufficient documentation

## 2013-11-01 DIAGNOSIS — Z8659 Personal history of other mental and behavioral disorders: Secondary | ICD-10-CM | POA: Insufficient documentation

## 2013-11-01 DIAGNOSIS — E669 Obesity, unspecified: Secondary | ICD-10-CM

## 2013-11-01 DIAGNOSIS — Z853 Personal history of malignant neoplasm of breast: Secondary | ICD-10-CM | POA: Insufficient documentation

## 2013-11-01 DIAGNOSIS — M1990 Unspecified osteoarthritis, unspecified site: Secondary | ICD-10-CM

## 2013-11-01 DIAGNOSIS — Z8673 Personal history of transient ischemic attack (TIA), and cerebral infarction without residual deficits: Secondary | ICD-10-CM | POA: Insufficient documentation

## 2013-11-01 DIAGNOSIS — M2550 Pain in unspecified joint: Secondary | ICD-10-CM

## 2013-11-01 DIAGNOSIS — D649 Anemia, unspecified: Secondary | ICD-10-CM | POA: Insufficient documentation

## 2013-11-01 DIAGNOSIS — G8929 Other chronic pain: Secondary | ICD-10-CM | POA: Insufficient documentation

## 2013-11-01 DIAGNOSIS — I129 Hypertensive chronic kidney disease with stage 1 through stage 4 chronic kidney disease, or unspecified chronic kidney disease: Secondary | ICD-10-CM | POA: Insufficient documentation

## 2013-11-01 DIAGNOSIS — Z8709 Personal history of other diseases of the respiratory system: Secondary | ICD-10-CM | POA: Insufficient documentation

## 2013-11-01 DIAGNOSIS — Z8701 Personal history of pneumonia (recurrent): Secondary | ICD-10-CM | POA: Insufficient documentation

## 2013-11-01 DIAGNOSIS — F0280 Dementia in other diseases classified elsewhere without behavioral disturbance: Secondary | ICD-10-CM | POA: Insufficient documentation

## 2013-11-01 DIAGNOSIS — Z79899 Other long term (current) drug therapy: Secondary | ICD-10-CM | POA: Insufficient documentation

## 2013-11-01 DIAGNOSIS — N189 Chronic kidney disease, unspecified: Secondary | ICD-10-CM | POA: Insufficient documentation

## 2013-11-01 DIAGNOSIS — Z87891 Personal history of nicotine dependence: Secondary | ICD-10-CM | POA: Insufficient documentation

## 2013-11-01 MED ORDER — OXYCODONE-ACETAMINOPHEN 5-325 MG PO TABS
2.0000 | ORAL_TABLET | Freq: Once | ORAL | Status: AC
  Administered 2013-11-01: 2 via ORAL
  Filled 2013-11-01: qty 2

## 2013-11-01 NOTE — ED Provider Notes (Signed)
CSN: [id number]     Arrival date & time 11/01/13  2303 History   First MD Initiated Contact with Patient 11/01/13 2321     Chief Complaint  Patient presents with  . Joint Pain    arthritc back pain    (Consider location/radiation/quality/duration/timing/severity/associated sxs/prior Treatment) HPI This patient is a 72 year old woman with history of chronic myalgias secondary to degenerative joint disease and degenerative disc disease. She also suffers from Alzheimer's dementia and a host of chronic medical problems. She lives in an assisted care facility.  She presents to the emergency department for the third time in the past 3 days with complaints of joint pain. Today, she demands to be admitted to the hospital. She says that when the weather becomes cold, her joint pain gets worse. She is particularly upset because she is roomed with another patient who likes to keep the temperature of the room in the 60s. The patient says she wants the room temp in the upper 70s.   The patient received Vicodin 5/325 at 1730 this evening. She says this didn't help her pain. She has pain in all joints and especially her back. Says her pain is 10/10, aching.   Past Medical History  Diagnosis Date  . Arthritis   . Hypertension   . Gout   . Obesity   . Angina   . Heart murmur   . Chronic kidney disease   . Stroke   . Recurrent upper respiratory infection (URI)   . Hypothyroidism   . Blood transfusion   . Headache(784.0)   . Anxiety   . Dysrhythmia   . Anemia   . CAP (community acquired pneumonia)   . Alzheimer disease   . Dementia   . Shortness of breath 05/26/2013  . DDD (degenerative disc disease), lumbar   . Breast cancer 10/22/2012   Past Surgical History  Procedure Laterality Date  . Uterine fibroid surgery     Family History  Problem Relation Age of Onset  . Asthma Mother   . Heart attack Father    History  Substance Use Topics  . Smoking status: Former Smoker -- 0.25 packs/day     Types: Cigarettes    Quit date: 03/13/2012  . Smokeless tobacco: Not on file  . Alcohol Use: No   OB History   Grav Para Term Preterm Abortions TAB SAB Ect Mult Living                 Review of Systems 10 point ROS obtained and is negative with the exception of sx noted above.   Allergies  Sulfa antibiotics  Home Medications   Current Outpatient Rx  Name  Route  Sig  Dispense  Refill  . albuterol (PROVENTIL HFA;VENTOLIN HFA) 108 (90 BASE) MCG/ACT inhaler   Inhalation   Inhale 2 puffs into the lungs every 6 (six) hours as needed. For shortness of breath          . aspirin EC 325 MG tablet   Oral   Take 325 mg by mouth daily.         . carvedilol (COREG) 3.125 MG tablet   Oral   Take 3.125 mg by mouth 2 (two) times daily with a meal.          . cetirizine (ZYRTEC) 10 MG tablet   Oral   Take 10 mg by mouth daily.         . clorazepate (TRANXENE) 7.5 MG tablet   Oral   Take  7.5 mg by mouth 3 (three) times daily.         . colchicine 0.6 MG tablet   Oral   Take 0.6 mg by mouth daily.         . Cranberry 475 MG CAPS   Oral   Take 475 mg by mouth 2 (two) times daily.         Marland Kitchen docusate sodium (COLACE) 100 MG capsule   Oral   Take 100 mg by mouth 2 (two) times daily.         Marland Kitchen donepezil (ARICEPT) 10 MG tablet   Oral   Take 10 mg by mouth at bedtime.          . ferrous sulfate 325 (65 FE) MG tablet   Oral   Take 325 mg by mouth daily with breakfast.         . fluticasone (FLONASE) 50 MCG/ACT nasal spray   Nasal   Place 2 sprays into the nose daily.         . furosemide (LASIX) 40 MG tablet   Oral   Take 40 mg by mouth 2 (two) times daily.          Marland Kitchen HYDROcodone-acetaminophen (NORCO/VICODIN) 5-325 MG per tablet   Oral   Take 1 tablet by mouth every 4 (four) hours as needed for pain.         . Memantine HCl ER (NAMENDA XR) 28 MG CP24   Oral   Take 1 capsule by mouth daily.         . Multiple Vitamin (MULTIVITAMIN WITH  MINERALS) TABS   Oral   Take 1 tablet by mouth daily.         Marland Kitchen oxybutynin (DITROPAN) 5 MG tablet   Oral   Take 5 mg by mouth daily.          . pantoprazole (PROTONIX) 40 MG tablet   Oral   Take 1 tablet (40 mg total) by mouth daily.   30 tablet   0   . psyllium (METAMUCIL) 58.6 % powder   Oral   Take 1 packet by mouth every morning.         . simvastatin (ZOCOR) 10 MG tablet   Oral   Take 10 mg by mouth at bedtime.         . nitroGLYCERIN (NITROSTAT) 0.4 MG SL tablet   Sublingual   Place 0.4 mg under the tongue once.         Bertram Gala Glycol-Propyl Glycol (SYSTANE OP)   Ophthalmic   Apply 1 drop to eye 4 (four) times daily as needed (dry eyes).          BP 105/67  Pulse 86  Temp(Src) 98.7 F (37.1 C)  Resp 20  SpO2 96% Physical Exam  Gen: well developed and well nourished appearing, obese Head: NCAT Eyes: PERL, EOMI Nose: no epistaixis or rhinorrhea Mouth/throat: mucosa is moist and pink Neck: supple, no stridor Lungs: CTA B, no wheezing, rhonchi or rales CV: RRR, no murmur, cap refill < 2s. Abd: soft, notender, morbidly obese, nondistended Back: diffuse midline ttp over the paraspinal musculature and the midline Skin: warm and dry Neuro: CN ii-xii grossly intact, no focal deficits Ext: joints are normal to inspection, FROM without pain both shoulder, elbows, wrists, hips, knees, ankles.  Psyche; irritable affect.   ED Course  Procedures (including critical care time) Labs Review Labs Reviewed - No data to display Imaging Review Ct Thoracic Spine  Wo Contrast  10/31/2013   CLINICAL DATA:  Long-standing back pain  EXAM: CT THORACIC AND LUMBAR SPINE WITHOUT CONTRAST  TECHNIQUE: Multidetector CT imaging of the thoracic and lumbar spine was performed without contrast. Multiplanar CT image reconstructions were also generated.  COMPARISON:  Prior MRI from 03/22/2007.  FINDINGS: CT THORACIC SPINE FINDINGS  The vertebral bodies are normally aligned  with preservation of the normal thoracic kyphosis. Vertebral body heights are preserved. No focal lytic or blastic osseous lesions are identified. Degenerative Schmorl's nodes are present at the superior endplates of T9 and T10, as well as the inferior endplate of T7. There is no acute fracture or listhesis.  Multilevel degenerative disc disease is seen throughout the thoracic spine, with degenerative disc desiccation seen at the T6 through T11 levels. There is degenerative intervertebral disk space narrowing with endplate osteophytosis at T6 through T11. No definite neural foraminal or central canal stenosis appreciated.  Severe degenerative disc disease is also seen within the partially visualized lower cervical spine with pronounced degenerative intervertebral disk space narrowing, endplate sclerosis and osteophytosis seen at C6-7 and C7-T1.  The visualized paraspinous soft tissues are within normal limits. Dependent atelectasis is present within both lung bases.  CT LUMBAR SPINE FINDINGS  For the purposes of this dictation, the lowest well-formed intervertebral disc spaces presumed to be the L5-S1 level and there presumed to be 5 lumbar type vertebral bodies.  There is grade 1 anterolisthesis of L5 on S1 with trace anterolisthesis of L4 on L5. Otherwise, vertebral bodies are normally aligned with preservation of the normal lumbar lordosis. Vertebral body heights are preserved. No acute fracture or listhesis.  At L1-2 through L3-4 levels there is no significant disc disease or disc bulge. No significant facet arthropathy.  At L4-5 diffuse degenerative disc bulging with associated degenerative endplate changes and severe bilateral facet arthrosis. There is at least moderate bilateral foraminal stenosis, left worse than right. Mild central canal stenosis is present. No definite focal disc herniation identified.  At L5-S1, there is a grade 1 anterior listhesis with severe degenerative intervertebral disk space  narrowing. There is near complete ankylosis at this level. Severe bilateral facet arthropathy is present with moderate right and mild left foraminal narrowing. No definite central canal stenosis.  Paraspinous soft tissues are within normal limits. Atherosclerotic calcifications are noted within the visualized aorta and branch vessels.  IMPRESSION: CT THORACIC SPINE IMPRESSION  1. Moderate degenerative disc disease extending from the T6 through T11 levels without definite foraminal or central canal stenosis. 2. Severe degenerative disc disease at C6-7 and C7-T1. 3. No acute fracture or listhesis. No focal osseous lesions.  CT LUMBAR SPINE IMPRESSION  1. Grade 1 anterolisthesis of L4 on L5 and L5 on S1 with severe degenerative disc disease at these levels, most severe at L5-S1. Further evaluation with MRI could be performed for more detailed evaluation as clinically indicated. 2. Severe bilateral facet arthrosis at L4-5 and L5-S1. 3. No acute fracture listhesis. No focal osseous lesions.   Electronically Signed   By: Rise Mu M.D.   On: 10/31/2013 22:14   Ct Lumbar Spine Wo Contrast  10/31/2013   CLINICAL DATA:  Long-standing back pain  EXAM: CT THORACIC AND LUMBAR SPINE WITHOUT CONTRAST  TECHNIQUE: Multidetector CT imaging of the thoracic and lumbar spine was performed without contrast. Multiplanar CT image reconstructions were also generated.  COMPARISON:  Prior MRI from 03/22/2007.  FINDINGS: CT THORACIC SPINE FINDINGS  The vertebral bodies are normally aligned with preservation of the  normal thoracic kyphosis. Vertebral body heights are preserved. No focal lytic or blastic osseous lesions are identified. Degenerative Schmorl's nodes are present at the superior endplates of T9 and T10, as well as the inferior endplate of T7. There is no acute fracture or listhesis.  Multilevel degenerative disc disease is seen throughout the thoracic spine, with degenerative disc desiccation seen at the T6 through  T11 levels. There is degenerative intervertebral disk space narrowing with endplate osteophytosis at T6 through T11. No definite neural foraminal or central canal stenosis appreciated.  Severe degenerative disc disease is also seen within the partially visualized lower cervical spine with pronounced degenerative intervertebral disk space narrowing, endplate sclerosis and osteophytosis seen at C6-7 and C7-T1.  The visualized paraspinous soft tissues are within normal limits. Dependent atelectasis is present within both lung bases.  CT LUMBAR SPINE FINDINGS  For the purposes of this dictation, the lowest well-formed intervertebral disc spaces presumed to be the L5-S1 level and there presumed to be 5 lumbar type vertebral bodies.  There is grade 1 anterolisthesis of L5 on S1 with trace anterolisthesis of L4 on L5. Otherwise, vertebral bodies are normally aligned with preservation of the normal lumbar lordosis. Vertebral body heights are preserved. No acute fracture or listhesis.  At L1-2 through L3-4 levels there is no significant disc disease or disc bulge. No significant facet arthropathy.  At L4-5 diffuse degenerative disc bulging with associated degenerative endplate changes and severe bilateral facet arthrosis. There is at least moderate bilateral foraminal stenosis, left worse than right. Mild central canal stenosis is present. No definite focal disc herniation identified.  At L5-S1, there is a grade 1 anterior listhesis with severe degenerative intervertebral disk space narrowing. There is near complete ankylosis at this level. Severe bilateral facet arthropathy is present with moderate right and mild left foraminal narrowing. No definite central canal stenosis.  Paraspinous soft tissues are within normal limits. Atherosclerotic calcifications are noted within the visualized aorta and branch vessels.  IMPRESSION: CT THORACIC SPINE IMPRESSION  1. Moderate degenerative disc disease extending from the T6 through  T11 levels without definite foraminal or central canal stenosis. 2. Severe degenerative disc disease at C6-7 and C7-T1. 3. No acute fracture or listhesis. No focal osseous lesions.  CT LUMBAR SPINE IMPRESSION  1. Grade 1 anterolisthesis of L4 on L5 and L5 on S1 with severe degenerative disc disease at these levels, most severe at L5-S1. Further evaluation with MRI could be performed for more detailed evaluation as clinically indicated. 2. Severe bilateral facet arthrosis at L4-5 and L5-S1. 3. No acute fracture listhesis. No focal osseous lesions.   Electronically Signed   By: Rise Mu M.D.   On: 10/31/2013 22:14     MDM  Has chronic arthralgias. She does not have an indication for admission. We are treating her acute pain in the emergency department. She has had an extensive workup during her last couple of ED visits. I reviewed the findings of CT of the entire spine from November 7. Again, there are no findings which support admission to the hospital. We are calling the patient's nursing home to see if she can have any change to her room assignment. She will be discharged.     Brandt Loosen, MD 11/02/13 0030

## 2013-11-01 NOTE — ED Notes (Signed)
MD at bedside. 

## 2013-11-01 NOTE — ED Notes (Signed)
Per EMS pt has chronic arthritic pain; pt had recent change in pain medication with no success. Pt states she would like to be sent to [facility] care for rehab. Pt states she told staff at Assisted Living she needed to come to ED. Pt states she is unable to completed ADLs independently.

## 2013-11-02 NOTE — ED Notes (Signed)
Spoke with MD about Social work consult; pt states she does not want to go back to assisted living; pt states she is not able to care for her self and assisted living will not provide bedside commode or assistance with ADLs. Pt states roommate keeps room to cold for her joints. Call placed to pt daughter Dawayne Patricia per pt request at [phone] no answer at # given.

## 2013-11-02 NOTE — ED Notes (Signed)
Called placed to [facility]- no answer

## 2013-11-02 NOTE — ED Notes (Signed)
Spoke with pt. Daughter about pt concerns on returning to assisted living. Family was notified that pt will be returned back to assisted living tonight. Family will take care of concerns with assisted living on tomorrow.

## 2013-11-03 NOTE — ED Provider Notes (Signed)
Medical screening examination/treatment/procedure(s) were performed by non-physician practitioner and as supervising physician I was immediately available for consultation/collaboration.  EKG Interpretation   None        Brek Reece R. Rahim Astorga, MD 11/03/13 1454 

## 2014-07-01 ENCOUNTER — Emergency Department (HOSPITAL_COMMUNITY)
Admission: EM | Admit: 2014-07-01 | Discharge: 2014-07-02 | Disposition: A | Discharge status: Home / Self Care | Payer: Medicare HMO | Attending: Emergency Medicine | Admitting: Emergency Medicine

## 2014-07-01 ENCOUNTER — Encounter (HOSPITAL_COMMUNITY): Payer: Self-pay | Admitting: Emergency Medicine

## 2014-07-01 ENCOUNTER — Emergency Department (HOSPITAL_COMMUNITY): Payer: Medicare HMO

## 2014-07-01 DIAGNOSIS — Z853 Personal history of malignant neoplasm of breast: Secondary | ICD-10-CM | POA: Diagnosis not present

## 2014-07-01 DIAGNOSIS — M5137 Other intervertebral disc degeneration, lumbosacral region: Secondary | ICD-10-CM | POA: Diagnosis not present

## 2014-07-01 DIAGNOSIS — I499 Cardiac arrhythmia, unspecified: Secondary | ICD-10-CM | POA: Insufficient documentation

## 2014-07-01 DIAGNOSIS — Z79899 Other long term (current) drug therapy: Secondary | ICD-10-CM | POA: Insufficient documentation

## 2014-07-01 DIAGNOSIS — G309 Alzheimer's disease, unspecified: Secondary | ICD-10-CM | POA: Insufficient documentation

## 2014-07-01 DIAGNOSIS — J40 Bronchitis, not specified as acute or chronic: Secondary | ICD-10-CM

## 2014-07-01 DIAGNOSIS — E039 Hypothyroidism, unspecified: Secondary | ICD-10-CM | POA: Diagnosis not present

## 2014-07-01 DIAGNOSIS — Z87891 Personal history of nicotine dependence: Secondary | ICD-10-CM | POA: Insufficient documentation

## 2014-07-01 DIAGNOSIS — Z7982 Long term (current) use of aspirin: Secondary | ICD-10-CM | POA: Diagnosis not present

## 2014-07-01 DIAGNOSIS — I209 Angina pectoris, unspecified: Secondary | ICD-10-CM | POA: Diagnosis not present

## 2014-07-01 DIAGNOSIS — F411 Generalized anxiety disorder: Secondary | ICD-10-CM | POA: Diagnosis not present

## 2014-07-01 DIAGNOSIS — I129 Hypertensive chronic kidney disease with stage 1 through stage 4 chronic kidney disease, or unspecified chronic kidney disease: Secondary | ICD-10-CM | POA: Diagnosis not present

## 2014-07-01 DIAGNOSIS — N189 Chronic kidney disease, unspecified: Secondary | ICD-10-CM | POA: Diagnosis not present

## 2014-07-01 DIAGNOSIS — Z8701 Personal history of pneumonia (recurrent): Secondary | ICD-10-CM | POA: Diagnosis not present

## 2014-07-01 DIAGNOSIS — F0280 Dementia in other diseases classified elsewhere without behavioral disturbance: Secondary | ICD-10-CM | POA: Diagnosis not present

## 2014-07-01 DIAGNOSIS — R059 Cough, unspecified: Secondary | ICD-10-CM | POA: Diagnosis present

## 2014-07-01 DIAGNOSIS — R05 Cough: Secondary | ICD-10-CM | POA: Diagnosis present

## 2014-07-01 DIAGNOSIS — M109 Gout, unspecified: Secondary | ICD-10-CM | POA: Diagnosis not present

## 2014-07-01 DIAGNOSIS — Z862 Personal history of diseases of the blood and blood-forming organs and certain disorders involving the immune mechanism: Secondary | ICD-10-CM | POA: Insufficient documentation

## 2014-07-01 DIAGNOSIS — Z8673 Personal history of transient ischemic attack (TIA), and cerebral infarction without residual deficits: Secondary | ICD-10-CM | POA: Insufficient documentation

## 2014-07-01 DIAGNOSIS — M51379 Other intervertebral disc degeneration, lumbosacral region without mention of lumbar back pain or lower extremity pain: Secondary | ICD-10-CM | POA: Insufficient documentation

## 2014-07-01 DIAGNOSIS — E669 Obesity, unspecified: Secondary | ICD-10-CM | POA: Insufficient documentation

## 2014-07-01 DIAGNOSIS — R011 Cardiac murmur, unspecified: Secondary | ICD-10-CM | POA: Diagnosis not present

## 2014-07-01 LAB — CBC WITH DIFFERENTIAL/PLATELET
Basophils Absolute: 0 10*3/uL (ref 0.0–0.1)
Basophils Relative: 1 % (ref 0–1)
Eosinophils Absolute: 0.5 10*3/uL (ref 0.0–0.7)
Eosinophils Relative: 6 % — ABNORMAL HIGH (ref 0–5)
HCT: 33.1 % — ABNORMAL LOW (ref 36.0–46.0)
Hemoglobin: 10.5 g/dL — ABNORMAL LOW (ref 12.0–15.0)
Lymphocytes Relative: 23 % (ref 12–46)
Lymphs Abs: 1.9 10*3/uL (ref 0.7–4.0)
MCH: 28.1 pg (ref 26.0–34.0)
MCHC: 31.7 g/dL (ref 30.0–36.0)
MCV: 88.5 fL (ref 78.0–100.0)
Monocytes Absolute: 0.4 10*3/uL (ref 0.1–1.0)
Monocytes Relative: 5 % (ref 3–12)
Neutro Abs: 5.2 10*3/uL (ref 1.7–7.7)
Neutrophils Relative %: 65 % (ref 43–77)
Platelets: 291 10*3/uL (ref 150–400)
RBC: 3.74 MIL/uL — ABNORMAL LOW (ref 3.87–5.11)
RDW: 16.4 % — ABNORMAL HIGH (ref 11.5–15.5)
WBC: 8.1 10*3/uL (ref 4.0–10.5)

## 2014-07-01 LAB — URINALYSIS, ROUTINE W REFLEX MICROSCOPIC
Bilirubin Urine: NEGATIVE
Glucose, UA: NEGATIVE mg/dL
Hgb urine dipstick: NEGATIVE
Ketones, ur: NEGATIVE mg/dL
Leukocytes, UA: NEGATIVE
Nitrite: NEGATIVE
Protein, ur: NEGATIVE mg/dL
Specific Gravity, Urine: 1.014 (ref 1.005–1.030)
Urobilinogen, UA: 0.2 mg/dL (ref 0.0–1.0)
pH: 6 (ref 5.0–8.0)

## 2014-07-01 LAB — COMPREHENSIVE METABOLIC PANEL
ALT: 13 U/L (ref 0–35)
AST: 19 U/L (ref 0–37)
Albumin: 3.6 g/dL (ref 3.5–5.2)
Alkaline Phosphatase: 177 U/L — ABNORMAL HIGH (ref 39–117)
Anion gap: 13 (ref 5–15)
BUN: 23 mg/dL (ref 6–23)
CO2: 29 mEq/L (ref 19–32)
Calcium: 10.1 mg/dL (ref 8.4–10.5)
Chloride: 100 mEq/L (ref 96–112)
Creatinine, Ser: 1.63 mg/dL — ABNORMAL HIGH (ref 0.50–1.10)
GFR calc Af Amer: 35 mL/min — ABNORMAL LOW (ref >90)
GFR calc non Af Amer: 30 mL/min — ABNORMAL LOW (ref >90)
Glucose, Bld: 108 mg/dL — ABNORMAL HIGH (ref 70–99)
Potassium: 4 mEq/L (ref 3.7–5.3)
Sodium: 142 mEq/L (ref 137–147)
Total Bilirubin: <0.2 mg/dL — ABNORMAL LOW (ref 0.3–1.2)
Total Protein: 7.4 g/dL (ref 6.0–8.3)

## 2014-07-01 MED ORDER — ALBUTEROL SULFATE (2.5 MG/3ML) 0.083% IN NEBU
5.0000 mg | INHALATION_SOLUTION | Freq: Once | RESPIRATORY_TRACT | Status: AC
  Administered 2014-07-01: 5 mg via RESPIRATORY_TRACT
  Filled 2014-07-01: qty 6

## 2014-07-01 MED ORDER — ALBUTEROL SULFATE HFA 108 (90 BASE) MCG/ACT IN AERS: 2.0000 | INHALATION_SPRAY | RESPIRATORY_TRACT | Status: DC | PRN

## 2014-07-01 MED ORDER — SODIUM CHLORIDE 0.9 % IV BOLUS (SEPSIS)
500.0000 mL | Freq: Once | INTRAVENOUS | Status: AC
Start: 2014-07-01 — End: 2014-07-01
  Administered 2014-07-01: 500 mL via INTRAVENOUS

## 2014-07-01 MED ORDER — IPRATROPIUM BROMIDE 0.02 % IN SOLN
0.5000 mg | Freq: Once | RESPIRATORY_TRACT | Status: AC
  Administered 2014-07-01: 0.5 mg via RESPIRATORY_TRACT
  Filled 2014-07-01: qty 2.5

## 2014-07-01 MED ORDER — GUAIFENESIN ER 1200 MG PO TB12: 1.0000 | ORAL_TABLET | Freq: Two times a day (BID) | ORAL | Status: DC

## 2014-07-01 MED ORDER — PREDNISONE 50 MG PO TABS: 50.0000 mg | ORAL_TABLET | Freq: Every day | ORAL | Status: DC

## 2014-07-01 NOTE — ED Notes (Signed)
[facility] called for transport.  

## 2014-07-01 NOTE — ED Notes (Signed)
Per EMS: Pt from [facility].  C/o cough x 1 wk.  Productive cough.  Subjective fever.

## 2014-07-01 NOTE — ED Provider Notes (Signed)
Medical screening examination/treatment/procedure(s) were conducted as a shared visit with non-physician practitioner(s) and myself.  I personally evaluated the patient during the encounter.   EKG Interpretation None     Patient with shortness of breath. Negative xray. Not hypoxic with ambulation.   Jasper Riling. Alvino Chapel, MD 07/01/14 2352

## 2014-07-01 NOTE — ED Provider Notes (Signed)
CSN: [id number]     Arrival date & time 07/01/14  1629 History   First MD Initiated Contact with Patient 07/01/14 1635     Chief Complaint  Patient presents with  . Cough     (Consider location/radiation/quality/duration/timing/severity/associated sxs/prior Treatment) HPI Patient presents to the emergency department with cough, over the last week.  The patient, states, that she's had cough, hasn't been productive for the last week.  She states that she has felt some chills, but no actual fever.  The patient, states, that she's not had any chest pain, shortness of breath, nausea, vomiting, diarrhea, weakness, headache, or back pain, rash, or syncope.  The patient, states, that she did not take any medications prior to arrival.  The patient, states, that she did not see her primary care Dr. patient, states her symptoms have been constant.  Nothing seems to make her condition, better or worse Past Medical History  Diagnosis Date  . Arthritis   . Hypertension   . Gout   . Obesity   . Angina   . Heart murmur   . Chronic kidney disease   . Stroke   . Recurrent upper respiratory infection (URI)   . Hypothyroidism   . Blood transfusion   . Headache(784.0)   . Anxiety   . Dysrhythmia   . Anemia   . CAP (community acquired pneumonia)   . Alzheimer disease   . Dementia   . Shortness of breath 05/26/2013  . DDD (degenerative disc disease), lumbar   . Breast cancer 10/22/2012   Past Surgical History  Procedure Laterality Date  . Uterine fibroid surgery     Family History  Problem Relation Age of Onset  . Asthma Mother   . Heart attack Father    History  Substance Use Topics  . Smoking status: Former Smoker -- 0.25 packs/day    Types: Cigarettes    Quit date: 03/13/2012  . Smokeless tobacco: Not on file  . Alcohol Use: No   OB History   Grav Para Term Preterm Abortions TAB SAB Ect Mult Living                 Review of Systems   All other systems negative except as  documented in the HPI. All pertinent positives and negatives as reviewed in the HPI. Allergies  Sulfa antibiotics  Home Medications   Prior to Admission medications   Medication Sig Start Date End Date Taking? Authorizing Provider  allopurinol (ZYLOPRIM) 300 MG tablet Take 300 mg by mouth 2 (two) times daily.   Yes Historical Provider, MD  aspirin EC 325 MG tablet Take 325 mg by mouth every morning.    Yes Historical Provider, MD  carvedilol (COREG) 3.125 MG tablet Take 3.125 mg by mouth 2 (two) times daily with a meal.    Yes Historical Provider, MD  cetirizine (ZYRTEC) 10 MG tablet Take 10 mg by mouth every morning.    Yes Historical Provider, MD  clorazepate (TRANXENE) 7.5 MG tablet Take 7.5 mg by mouth 3 (three) times daily.   Yes Historical Provider, MD  colchicine 0.6 MG tablet Take 0.6 mg by mouth every morning.    Yes Historical Provider, MD  Cranberry 475 MG CAPS Take 475 mg by mouth 2 (two) times daily.   Yes Historical Provider, MD  docusate sodium (COLACE) 100 MG capsule Take 100 mg by mouth 2 (two) times daily.   Yes Historical Provider, MD  donepezil (ARICEPT) 10 MG tablet Take 10 mg by  mouth at bedtime.    Yes Historical Provider, MD  furosemide (LASIX) 40 MG tablet Take 40 mg by mouth 2 (two) times daily.    Yes Historical Provider, MD  HYDROcodone-acetaminophen (NORCO/VICODIN) 5-325 MG per tablet Take 1 tablet by mouth 3 (three) times daily as needed for moderate pain.    Yes Historical Provider, MD  Memantine HCl ER (NAMENDA XR) 28 MG CP24 Take 1 capsule by mouth 2 (two) times daily.    Yes Historical Provider, MD  Multiple Vitamins-Minerals (THEREMS-M) TABS Take 1 tablet by mouth every morning.   Yes Historical Provider, MD  oxybutynin (DITROPAN) 5 MG tablet Take 5 mg by mouth every morning.    Yes Historical Provider, MD  pantoprazole (PROTONIX) 40 MG tablet Take 40 mg by mouth every morning.   Yes Historical Provider, MD  simvastatin (ZOCOR) 10 MG tablet Take 10 mg by  mouth at bedtime.   Yes Historical Provider, MD   BP 118/75  Pulse 95  Temp(Src) 97.4 F (36.3 C) (Oral)  Resp 20  SpO2 100% Physical Exam  Nursing note and vitals reviewed. Constitutional: She is oriented to person, place, and time. She appears well-developed and well-nourished. No distress.  HENT:  Head: Normocephalic and atraumatic.  Mouth/Throat: Oropharynx is clear and moist.  Eyes: Pupils are equal, round, and reactive to light.  Neck: Normal range of motion. Neck supple.  Cardiovascular: Normal rate, regular rhythm and normal heart sounds.  Exam reveals no gallop and no friction rub.   No murmur heard. Pulmonary/Chest: Effort normal. No respiratory distress. She has wheezes. She has no rales.  Neurological: She is alert and oriented to person, place, and time.  Skin: Skin is warm and dry. No rash noted. No erythema.    ED Course  Procedures (including critical care time) Labs Review Labs Reviewed  CBC WITH DIFFERENTIAL - Abnormal; Notable for the following:    RBC 3.74 (*)    Hemoglobin 10.5 (*)    HCT 33.1 (*)    RDW 16.4 (*)    Eosinophils Relative 6 (*)    All other components within normal limits  COMPREHENSIVE METABOLIC PANEL - Abnormal; Notable for the following:    Glucose, Bld 108 (*)    Creatinine, Ser 1.63 (*)    Alkaline Phosphatase 177 (*)    Total Bilirubin <0.2 (*)    GFR calc non Af Amer 30 (*)    GFR calc Af Amer 35 (*)    All other components within normal limits  URINALYSIS, ROUTINE W REFLEX MICROSCOPIC - Abnormal; Notable for the following:    APPearance CLOUDY (*)    All other components within normal limits  URINE CULTURE    Imaging Review Dg Chest 2 View  07/01/2014   CLINICAL DATA:  Coughing and weakness  EXAM: CHEST  2 VIEW  COMPARISON:  Radiograph 10/12/2013  FINDINGS: Mildly enlarged cardiac silhouette with ectatic aorta. No pulmonary edema or infiltrate. There is a small right effusion. No pneumothorax  IMPRESSION: Small right  effusion.  No pulmonary edema.   Electronically Signed   By: Suzy Bouchard M.D.   On: 07/01/2014 18:14    Patient be treated for her bronchitis.  Told to return here as needed.  Advised followup with her primary care Dr. told to increase her fluid intake, rest as much as possible.  Patient, in stable here in the emergency department.  She ambulated without, difficulty and her pulse oximetry stayed around 97% during ambulation.    Harrell Gave  [place], PA-C 07/01/14 [place], PA-C 07/01/14 2325

## 2014-07-01 NOTE — ED Notes (Signed)
Pt O2 stayed at 97% while ambulating

## 2014-07-01 NOTE — Discharge Instructions (Signed)
Return here as needed.  Followup with a primary care Dr.

## 2014-07-02 LAB — URINE CULTURE
Colony Count: NO GROWTH
Culture: NO GROWTH

## 2015-02-10 ENCOUNTER — Emergency Department (HOSPITAL_COMMUNITY)
Admission: EM | Admit: 2015-02-10 | Discharge: 2015-02-10 | Disposition: A | Discharge status: Home / Self Care | Payer: Medicare HMO | Attending: Emergency Medicine | Admitting: Emergency Medicine

## 2015-02-10 ENCOUNTER — Emergency Department (HOSPITAL_COMMUNITY): Payer: Medicare HMO

## 2015-02-10 ENCOUNTER — Encounter (HOSPITAL_COMMUNITY): Payer: Self-pay | Admitting: Emergency Medicine

## 2015-02-10 DIAGNOSIS — Z8701 Personal history of pneumonia (recurrent): Secondary | ICD-10-CM | POA: Diagnosis not present

## 2015-02-10 DIAGNOSIS — Z7951 Long term (current) use of inhaled steroids: Secondary | ICD-10-CM | POA: Diagnosis not present

## 2015-02-10 DIAGNOSIS — M109 Gout, unspecified: Secondary | ICD-10-CM | POA: Diagnosis not present

## 2015-02-10 DIAGNOSIS — Z7982 Long term (current) use of aspirin: Secondary | ICD-10-CM | POA: Insufficient documentation

## 2015-02-10 DIAGNOSIS — R011 Cardiac murmur, unspecified: Secondary | ICD-10-CM | POA: Diagnosis not present

## 2015-02-10 DIAGNOSIS — Z87891 Personal history of nicotine dependence: Secondary | ICD-10-CM | POA: Diagnosis not present

## 2015-02-10 DIAGNOSIS — Z8673 Personal history of transient ischemic attack (TIA), and cerebral infarction without residual deficits: Secondary | ICD-10-CM | POA: Insufficient documentation

## 2015-02-10 DIAGNOSIS — G309 Alzheimer's disease, unspecified: Secondary | ICD-10-CM | POA: Diagnosis not present

## 2015-02-10 DIAGNOSIS — R300 Dysuria: Secondary | ICD-10-CM

## 2015-02-10 DIAGNOSIS — D649 Anemia, unspecified: Secondary | ICD-10-CM | POA: Diagnosis not present

## 2015-02-10 DIAGNOSIS — N189 Chronic kidney disease, unspecified: Secondary | ICD-10-CM | POA: Insufficient documentation

## 2015-02-10 DIAGNOSIS — Z8709 Personal history of other diseases of the respiratory system: Secondary | ICD-10-CM | POA: Diagnosis not present

## 2015-02-10 DIAGNOSIS — Z853 Personal history of malignant neoplasm of breast: Secondary | ICD-10-CM | POA: Insufficient documentation

## 2015-02-10 DIAGNOSIS — F0280 Dementia in other diseases classified elsewhere without behavioral disturbance: Secondary | ICD-10-CM | POA: Diagnosis not present

## 2015-02-10 DIAGNOSIS — I129 Hypertensive chronic kidney disease with stage 1 through stage 4 chronic kidney disease, or unspecified chronic kidney disease: Secondary | ICD-10-CM | POA: Insufficient documentation

## 2015-02-10 DIAGNOSIS — E669 Obesity, unspecified: Secondary | ICD-10-CM | POA: Diagnosis not present

## 2015-02-10 DIAGNOSIS — M1990 Unspecified osteoarthritis, unspecified site: Secondary | ICD-10-CM | POA: Diagnosis not present

## 2015-02-10 DIAGNOSIS — Z79899 Other long term (current) drug therapy: Secondary | ICD-10-CM | POA: Diagnosis not present

## 2015-02-10 DIAGNOSIS — R0602 Shortness of breath: Secondary | ICD-10-CM | POA: Diagnosis present

## 2015-02-10 LAB — URINALYSIS, ROUTINE W REFLEX MICROSCOPIC
Bilirubin Urine: NEGATIVE
GLUCOSE, UA: NEGATIVE mg/dL
HGB URINE DIPSTICK: NEGATIVE
Ketones, ur: NEGATIVE mg/dL
Nitrite: NEGATIVE
PROTEIN: NEGATIVE mg/dL
Specific Gravity, Urine: 1.014 (ref 1.005–1.030)
Urobilinogen, UA: 0.2 mg/dL (ref 0.0–1.0)
pH: 5.5 (ref 5.0–8.0)

## 2015-02-10 LAB — URINE MICROSCOPIC-ADD ON

## 2015-02-10 MED ORDER — PHENAZOPYRIDINE HCL 95 MG PO TABS
95.0000 mg | ORAL_TABLET | Freq: Three times a day (TID) | ORAL | Status: DC | PRN
Start: 2015-02-10 — End: 2015-04-29

## 2015-02-10 MED ORDER — FLUCONAZOLE 150 MG PO TABS
150.0000 mg | ORAL_TABLET | Freq: Once | ORAL | Status: AC
  Administered 2015-02-10: 150 mg via ORAL
  Filled 2015-02-10: qty 1

## 2015-02-10 NOTE — ED Notes (Signed)
Bed: WA10 Expected date: 02/10/15 Expected time: 4:23 AM Means of arrival: Ambulance Comments: 74 yo urinary sxs

## 2015-02-10 NOTE — ED Notes (Signed)
[facility] notified for transport 

## 2015-02-10 NOTE — ED Notes (Signed)
Pt comes from [facility]. Pt states she thinks she is having a bronchitis flare up as well as burning with urination and vaginal itching for a month. Pt states urine has been tested at facility but she has not seen the results.

## 2015-02-10 NOTE — Discharge Instructions (Signed)
Dysuria Dysuria is the medical term for pain with urination. There are many causes for dysuria, but urinary tract infection is the most common. If a urinalysis was performed it can show that there is a urinary tract infection. A urine culture confirms that you or your child is sick. You will need to follow up with a healthcare provider because:  If a urine culture was done you will need to know the culture results and treatment recommendations.  If the urine culture was positive, you or your child will need to be put on antibiotics or know if the antibiotics prescribed are the right antibiotics for your urinary tract infection.  If the urine culture is negative (no urinary tract infection), then other causes may need to be explored or antibiotics need to be stopped. Today laboratory work may have been done and there does not seem to be an infection. If cultures were done they will take at least 24 to 48 hours to be completed. Today x-rays may have been taken and they read as normal. No cause can be found for the problems. The x-rays may be re-read by a radiologist and you will be contacted if additional findings are made. You or your child may have been put on medications to help with this problem until you can see your primary caregiver. If the problems get better, see your primary caregiver if the problems return. If you were given antibiotics (medications which kill germs), take all of the mediations as directed for the full course of treatment.  If laboratory work was done, you need to find the results. Leave a telephone number where you can be reached. If this is not possible, make sure you find out how you are to get test results. HOME CARE INSTRUCTIONS   Drink lots of fluids. For adults, drink eight, 8 ounce glasses of clear juice or water a day. For children, replace fluids as suggested by your caregiver.  Empty the bladder often. Avoid holding urine for long periods of time.  After a bowel  movement, women should cleanse front to back, using each tissue only once.  Empty your bladder before and after sexual intercourse.  Take all the medicine given to you until it is gone. You may feel better in a few days, but TAKE ALL MEDICINE.  Avoid caffeine, tea, alcohol and carbonated beverages, because they tend to irritate the bladder.  In men, alcohol may irritate the prostate.  Only take over-the-counter or prescription medicines for pain, discomfort, or fever as directed by your caregiver.  If your caregiver has given you a follow-up appointment, it is very important to keep that appointment. Not keeping the appointment could result in a chronic or permanent injury, pain, and disability. If there is any problem keeping the appointment, you must call back to this facility for assistance. SEEK IMMEDIATE MEDICAL CARE IF:   Back pain develops.  A fever develops.  There is nausea (feeling sick to your stomach) or vomiting (throwing up).  Problems are no better with medications or are getting worse. MAKE SURE YOU:   Understand these instructions.  Will watch your condition.  Will get help right away if you are not doing well or get worse. Document Released: 09/08/2004 Document Revised: 03/04/2012 Document Reviewed: 07/16/2008 [facility] Patient Information 2015 [place], [place]. This information is not intended to replace advice given to you by your health care provider. Make sure you discuss any questions you have with your health care provider.  You were evaluated  in the ED today for your shortness of breath and urinary complaints. Your chest x-ray was normal along with all of your lab work. There is no evidence of UTI on your urinalysis. You'll be given azathioprine tablets to help with the burning sensation. It is important to follow up with her primary care within 48 hours for further evaluation and management of your symptoms. Return to ED for new or worsening symptoms.

## 2015-02-10 NOTE — ED Provider Notes (Signed)
CSN: [id number]     Arrival date & time 02/10/15  4166 History   First MD Initiated Contact with Patient 02/10/15 [phone]     Chief Complaint  Patient presents with  . Multiple complaints      (Consider location/radiation/quality/duration/timing/severity/associated sxs/prior Treatment) HPI Katherine Cooper is a 73 y.o. female from [facility]. She is here for evaluation of shortness of breath. She reports having bronchitis in the past and feels like she may be having it again. She denies fevers, cough, chills. She reports having shortness of breath after she eats usually every day at about 5 or 6:00 but does not experience this discomfort any other time. She has not taken anything for her symptoms. She also reports some burning with urination and thinks she may have a UTI. She reports these problems have been going on "off-and-on for the past month". She also reports a white vaginal discharge that she believes is a yeast infection. She prefers to be treated in the ED for this and forego a pelvic exam. She denies any other symptoms, no chest pain, nausea or vomiting, abdominal pain, diarrhea or constipation, numbness or weakness, syncope, new rashes  Past Medical History  Diagnosis Date  . Arthritis   . Hypertension   . Gout   . Obesity   . Angina   . Heart murmur   . Chronic kidney disease   . Stroke   . Recurrent upper respiratory infection (URI)   . Hypothyroidism   . Blood transfusion   . Headache(784.0)   . Anxiety   . Dysrhythmia   . Anemia   . CAP (community acquired pneumonia)   . Alzheimer disease   . Dementia   . Shortness of breath 05/26/2013  . DDD (degenerative disc disease), lumbar   . Breast cancer 10/22/2012   Past Surgical History  Procedure Laterality Date  . Uterine fibroid surgery     Family History  Problem Relation Age of Onset  . Asthma Mother   . Heart attack Father    History  Substance Use Topics  . Smoking status: Former Smoker --  0.25 packs/day    Types: Cigarettes    Quit date: 03/13/2012  . Smokeless tobacco: Not on file  . Alcohol Use: No   OB History    No data available     Review of Systems A 10 point review of systems was completed and was negative except for pertinent positives and negatives as mentioned in the history of present illness    Allergies  Sulfa antibiotics  Home Medications   Prior to Admission medications   Medication Sig Start Date End Date Taking? Authorizing Provider  allopurinol (ZYLOPRIM) 300 MG tablet Take 300 mg by mouth 2 (two) times daily.   Yes Historical Provider, MD  aspirin EC 325 MG tablet Take 325 mg by mouth every morning.    Yes Historical Provider, MD  carvedilol (COREG) 3.125 MG tablet Take 3.125 mg by mouth 2 (two) times daily with a meal.    Yes Historical Provider, MD  cetirizine (ZYRTEC) 10 MG tablet Take 10 mg by mouth every morning.    Yes Historical Provider, MD  clorazepate (TRANXENE) 7.5 MG tablet Take 7.5 mg by mouth 2 (two) times daily.    Yes Historical Provider, MD  colchicine 0.6 MG tablet Take 0.6 mg by mouth every morning.    Yes Historical Provider, MD  Cranberry 475 MG CAPS Take 475 mg by mouth 2 (two) times daily.  Yes Historical Provider, MD  docusate sodium (COLACE) 100 MG capsule Take 100 mg by mouth 2 (two) times daily.   Yes Historical Provider, MD  donepezil (ARICEPT) 10 MG tablet Take 10 mg by mouth at bedtime.    Yes Historical Provider, MD  ferrous sulfate 325 (65 FE) MG tablet Take 325 mg by mouth daily with breakfast.   Yes Historical Provider, MD  fluticasone (FLONASE) 50 MCG/ACT nasal spray Place 2 sprays into both nostrils at bedtime.   Yes Historical Provider, MD  furosemide (LASIX) 40 MG tablet Take 40 mg by mouth 2 (two) times daily.    Yes Historical Provider, MD  guaifenesin (ROBITUSSIN) 100 MG/5ML syrup Take 200 mg by mouth every 4 (four) hours as needed for cough.   Yes Historical Provider, MD  Guaifenesin 1200 MG TB12 Take 1  tablet (1,200 mg total) by mouth 2 (two) times daily. 07/01/14  Yes Resa Miner Lawyer, PA-C  HYDROcodone-acetaminophen (NORCO/VICODIN) 5-325 MG per tablet Take 1 tablet by mouth 3 (three) times daily as needed for moderate pain.    Yes Historical Provider, MD  Memantine HCl ER (NAMENDA XR) 28 MG CP24 Take 1 capsule by mouth every morning.    Yes Historical Provider, MD  Multiple Vitamin (MULTIVITAMIN) tablet Take 1 tablet by mouth daily. Thera-M   Yes Historical Provider, MD  nabumetone (RELAFEN) 750 MG tablet Take 750 mg by mouth 2 (two) times daily as needed for mild pain.   Yes Historical Provider, MD  oxybutynin (DITROPAN) 5 MG tablet Take 5 mg by mouth every morning.    Yes Historical Provider, MD  pantoprazole (PROTONIX) 40 MG tablet Take 40 mg by mouth every morning.   Yes Historical Provider, MD  simvastatin (ZOCOR) 10 MG tablet Take 10 mg by mouth at bedtime.   Yes Historical Provider, MD  vitamin C (ASCORBIC ACID) 500 MG tablet Take 500 mg by mouth daily.   Yes Historical Provider, MD  albuterol (PROVENTIL HFA;VENTOLIN HFA) 108 (90 BASE) MCG/ACT inhaler Inhale 2 puffs into the lungs every 4 (four) hours as needed for wheezing or shortness of breath. 07/01/14   Seymour, PA-C  phenazopyridine (PYRIDIUM) 95 MG tablet Take 1 tablet (95 mg total) by mouth 3 (three) times daily as needed for pain. 02/10/15   Viona Gilmore Albana Saperstein, PA-C  predniSONE (DELTASONE) 50 MG tablet Take 1 tablet (50 mg total) by mouth daily. Patient not taking: Reported on 02/10/2015 07/01/14   Resa Miner Lawyer, PA-C   BP 105/72 mmHg  Pulse 80  Temp(Src) 98.4 F (36.9 C) (Oral)  Resp 16  SpO2 98% Physical Exam  Constitutional: She is oriented to person, place, and time. She appears well-developed and well-nourished.  HENT:  Head: Normocephalic and atraumatic.  Mouth/Throat: Oropharynx is clear and moist.  Eyes: Conjunctivae are normal. Pupils are equal, round, and reactive to light. Right eye exhibits no  discharge. Left eye exhibits no discharge. No scleral icterus.  Neck: Neck supple.  Cardiovascular: Normal rate, regular rhythm and normal heart sounds.   Pulmonary/Chest: Effort normal and breath sounds normal. No respiratory distress. She has no wheezes. She has no rales.  Abdominal: Soft. There is no tenderness.  Musculoskeletal: She exhibits no edema or tenderness.  Neurological: She is alert and oriented to person, place, and time.  Cranial Nerves II-XII grossly intact  Skin: Skin is warm and dry. No rash noted.  Psychiatric: She has a normal mood and affect.  Nursing note and vitals reviewed.   ED  Course  Procedures (including critical care time) Labs Review Labs Reviewed  URINALYSIS, ROUTINE W REFLEX MICROSCOPIC - Abnormal; Notable for the following:    APPearance CLOUDY (*)    Leukocytes, UA TRACE (*)    All other components within normal limits  URINE MICROSCOPIC-ADD ON - Abnormal; Notable for the following:    Squamous Epithelial / LPF FEW (*)    All other components within normal limits  URINE CULTURE    Imaging Review Dg Chest 2 View  02/10/2015   CLINICAL DATA:  Increased dyspnea this morning.  Weakness.  EXAM: CHEST  2 VIEW  COMPARISON:  07/01/2014  FINDINGS: There is mild aortic tortuosity and cardiomegaly, unchanged. No airspace opacities are evident. There are no pleural effusions. Pulmonary vasculature is normal.  IMPRESSION: No acute cardiopulmonary findings   Electronically Signed   By: Andreas Newport M.D.   On: 02/10/2015 05:59     EKG Interpretation None     Meds given in ED:  Medications  fluconazole (DIFLUCAN) tablet 150 mg (150 mg Oral Given 02/10/15 0731)    New Prescriptions   PHENAZOPYRIDINE (PYRIDIUM) 95 MG TABLET    Take 1 tablet (95 mg total) by mouth 3 (three) times daily as needed for pain.   Filed Vitals:   02/10/15 0500 02/10/15 0702  BP: 120/64 105/72  Pulse: 92 80  Temp: 98.4 F (36.9 C)   TempSrc: Oral   Resp: 16 16  SpO2:  97% 98%    MDM  Vitals stable - WNL -afebrile Pt resting comfortably in ED. Upon multiple rechecks, patient is sleeping in ED in no apparent distress. PE-normal lung exam, no tachypnea, no evidence of respiratory distress. Patient refuses pelvic exam at this time. Labwork noncontributory. Will obtain urine culture.  Imaging--chest x-ray shows no acute cardiopulmonary pathology.  Given Diflucan in the ED for patient reported increased infection Will DC with azathioprine to help with urinary symptoms. Low concern for other acute or emergent pathology. Without fever, productive cough doubt pneumonia, no evidence of PE, PTX. No chest pain, nausea or vomiting, diaphoresis. Low concern for ACS or esophageal rupture. I discussed all relevant lab findings and imaging results with pt and they verbalized understanding. Discussed f/u with PCP within 48 hrs and return precautions, pt very amenable to plan.  Final diagnoses:  Dysuria        Verl Dicker, PA-C 02/10/15 4540  Kalman Drape, MD 02/11/15 [phone]

## 2015-02-11 LAB — URINE CULTURE
Colony Count: 60000
SPECIAL REQUESTS: NORMAL

## 2015-04-29 ENCOUNTER — Emergency Department (HOSPITAL_COMMUNITY)
Admission: EM | Admit: 2015-04-29 | Discharge: 2015-04-29 | Disposition: A | Discharge status: Home / Self Care | Payer: Medicare HMO | Attending: Emergency Medicine | Admitting: Emergency Medicine

## 2015-04-29 ENCOUNTER — Encounter (HOSPITAL_COMMUNITY): Payer: Self-pay | Admitting: Emergency Medicine

## 2015-04-29 DIAGNOSIS — Z8701 Personal history of pneumonia (recurrent): Secondary | ICD-10-CM | POA: Diagnosis not present

## 2015-04-29 DIAGNOSIS — Z8659 Personal history of other mental and behavioral disorders: Secondary | ICD-10-CM | POA: Diagnosis not present

## 2015-04-29 DIAGNOSIS — F0280 Dementia in other diseases classified elsewhere without behavioral disturbance: Secondary | ICD-10-CM | POA: Diagnosis not present

## 2015-04-29 DIAGNOSIS — Z853 Personal history of malignant neoplasm of breast: Secondary | ICD-10-CM | POA: Insufficient documentation

## 2015-04-29 DIAGNOSIS — N189 Chronic kidney disease, unspecified: Secondary | ICD-10-CM | POA: Diagnosis not present

## 2015-04-29 DIAGNOSIS — Z8709 Personal history of other diseases of the respiratory system: Secondary | ICD-10-CM | POA: Insufficient documentation

## 2015-04-29 DIAGNOSIS — Z87440 Personal history of urinary (tract) infections: Secondary | ICD-10-CM | POA: Insufficient documentation

## 2015-04-29 DIAGNOSIS — R300 Dysuria: Secondary | ICD-10-CM

## 2015-04-29 DIAGNOSIS — Z7982 Long term (current) use of aspirin: Secondary | ICD-10-CM | POA: Diagnosis not present

## 2015-04-29 DIAGNOSIS — Z87891 Personal history of nicotine dependence: Secondary | ICD-10-CM | POA: Insufficient documentation

## 2015-04-29 DIAGNOSIS — Z8673 Personal history of transient ischemic attack (TIA), and cerebral infarction without residual deficits: Secondary | ICD-10-CM | POA: Diagnosis not present

## 2015-04-29 DIAGNOSIS — I209 Angina pectoris, unspecified: Secondary | ICD-10-CM | POA: Diagnosis not present

## 2015-04-29 DIAGNOSIS — D649 Anemia, unspecified: Secondary | ICD-10-CM | POA: Insufficient documentation

## 2015-04-29 DIAGNOSIS — R011 Cardiac murmur, unspecified: Secondary | ICD-10-CM | POA: Diagnosis not present

## 2015-04-29 DIAGNOSIS — G309 Alzheimer's disease, unspecified: Secondary | ICD-10-CM | POA: Diagnosis not present

## 2015-04-29 DIAGNOSIS — E669 Obesity, unspecified: Secondary | ICD-10-CM | POA: Diagnosis not present

## 2015-04-29 DIAGNOSIS — Z79899 Other long term (current) drug therapy: Secondary | ICD-10-CM | POA: Insufficient documentation

## 2015-04-29 DIAGNOSIS — M1990 Unspecified osteoarthritis, unspecified site: Secondary | ICD-10-CM | POA: Insufficient documentation

## 2015-04-29 DIAGNOSIS — M109 Gout, unspecified: Secondary | ICD-10-CM | POA: Insufficient documentation

## 2015-04-29 DIAGNOSIS — Z7952 Long term (current) use of systemic steroids: Secondary | ICD-10-CM | POA: Diagnosis not present

## 2015-04-29 DIAGNOSIS — I129 Hypertensive chronic kidney disease with stage 1 through stage 4 chronic kidney disease, or unspecified chronic kidney disease: Secondary | ICD-10-CM | POA: Insufficient documentation

## 2015-04-29 LAB — URINE MICROSCOPIC-ADD ON

## 2015-04-29 LAB — URINALYSIS, ROUTINE W REFLEX MICROSCOPIC
BILIRUBIN URINE: NEGATIVE
Glucose, UA: NEGATIVE mg/dL
HGB URINE DIPSTICK: NEGATIVE
Ketones, ur: NEGATIVE mg/dL
NITRITE: NEGATIVE
PH: 6 (ref 5.0–8.0)
Protein, ur: NEGATIVE mg/dL
Specific Gravity, Urine: 1.018 (ref 1.005–1.030)
Urobilinogen, UA: 0.2 mg/dL (ref 0.0–1.0)

## 2015-04-29 MED ORDER — PHENAZOPYRIDINE HCL 95 MG PO TABS: 95.0000 mg | ORAL_TABLET | Freq: Three times a day (TID) | ORAL | Status: DC | PRN

## 2015-04-29 MED ORDER — HYDROCODONE-ACETAMINOPHEN 5-325 MG PO TABS
2.0000 | ORAL_TABLET | Freq: Once | ORAL | Status: AC
  Administered 2015-04-29: 2 via ORAL
  Filled 2015-04-29: qty 2

## 2015-04-29 MED ORDER — CEPHALEXIN 500 MG PO CAPS
1000.0000 mg | ORAL_CAPSULE | Freq: Once | ORAL | Status: AC
  Administered 2015-04-29: 1000 mg via ORAL
  Filled 2015-04-29: qty 2

## 2015-04-29 MED ORDER — CEPHALEXIN 500 MG PO CAPS: 500.0000 mg | ORAL_CAPSULE | Freq: Four times a day (QID) | ORAL | Status: DC

## 2015-04-29 NOTE — ED Provider Notes (Signed)
CSN: [id number]     Arrival date & time 04/29/15  0605 History   First MD Initiated Contact with Patient 04/29/15 0615     Chief Complaint  Patient presents with  . Dysuria    x2 weeks     (Consider location/radiation/quality/duration/timing/severity/associated sxs/prior Treatment) HPI Katherine Cooper is a 74 y.o. female from [place] care who comes in for evaluation of dysuria. Patient believes she may have a UTI because she has had a burning sensation when she urinates for the past week. She has had UTIs in the past and this feels similar. She has not tried anything to improve the symptoms. She believes her fluid pill exacerbates her symptoms because it makes her pee more. She denies fevers, hematuria, abdominal pain, vaginal bleeding or discharge. She reports intermittent chills. She also complains of diffuse pain throughout her body that she attributes to arthritis. This is unchanged for her. No other aggravating or modifying factors.  Past Medical History  Diagnosis Date  . Arthritis   . Hypertension   . Gout   . Obesity   . Angina   . Heart murmur   . Chronic kidney disease   . Stroke   . Recurrent upper respiratory infection (URI)   . Hypothyroidism   . Blood transfusion   . Headache(784.0)   . Anxiety   . Dysrhythmia   . Anemia   . CAP (community acquired pneumonia)   . Alzheimer disease   . Dementia   . Shortness of breath 05/26/2013  . DDD (degenerative disc disease), lumbar   . Breast cancer 10/22/2012   Past Surgical History  Procedure Laterality Date  . Uterine fibroid surgery     Family History  Problem Relation Age of Onset  . Asthma Mother   . Heart attack Father    History  Substance Use Topics  . Smoking status: Former Smoker -- 0.25 packs/day    Types: Cigarettes    Quit date: 03/13/2012  . Smokeless tobacco: Not on file  . Alcohol Use: No   OB History    No data available     Review of Systems A 10 point review of systems was completed and  was negative except for pertinent positives and negatives as mentioned in the history of present illness     Allergies  Sulfa antibiotics  Home Medications   Prior to Admission medications   Medication Sig Start Date End Date Taking? Authorizing Provider  albuterol (PROVENTIL HFA;VENTOLIN HFA) 108 (90 BASE) MCG/ACT inhaler Inhale 2 puffs into the lungs every 4 (four) hours as needed for wheezing or shortness of breath. 07/01/14  Yes Christopher Lawyer, PA-C  allopurinol (ZYLOPRIM) 300 MG tablet Take 300 mg by mouth 2 (two) times daily.   Yes Historical Provider, MD  aspirin EC 325 MG tablet Take 325 mg by mouth every morning.    Yes Historical Provider, MD  carvedilol (COREG) 3.125 MG tablet Take 3.125 mg by mouth 2 (two) times daily with a meal.    Yes Historical Provider, MD  cetirizine (ZYRTEC) 10 MG tablet Take 10 mg by mouth every morning.    Yes Historical Provider, MD  clorazepate (TRANXENE) 7.5 MG tablet Take 7.5 mg by mouth 2 (two) times daily.    Yes Historical Provider, MD  colchicine 0.6 MG tablet Take 0.6 mg by mouth every morning.    Yes Historical Provider, MD  docusate sodium (COLACE) 100 MG capsule Take 100 mg by mouth 2 (two) times daily.   Yes  Historical Provider, MD  donepezil (ARICEPT) 10 MG tablet Take 10 mg by mouth at bedtime.    Yes Historical Provider, MD  ferrous sulfate 325 (65 FE) MG tablet Take 325 mg by mouth daily with breakfast.   Yes Historical Provider, MD  fluticasone (FLONASE) 50 MCG/ACT nasal spray Place 2 sprays into both nostrils at bedtime.   Yes Historical Provider, MD  furosemide (LASIX) 40 MG tablet Take 40 mg by mouth 2 (two) times daily.    Yes Historical Provider, MD  guaifenesin (ROBITUSSIN) 100 MG/5ML syrup Take 200 mg by mouth every 4 (four) hours as needed for cough.   Yes Historical Provider, MD  HYDROcodone-acetaminophen (NORCO/VICODIN) 5-325 MG per tablet Take 1 tablet by mouth 3 (three) times daily as needed for moderate pain.    Yes  Historical Provider, MD  Memantine HCl ER (NAMENDA XR) 28 MG CP24 Take 1 capsule by mouth every morning.    Yes Historical Provider, MD  nabumetone (RELAFEN) 750 MG tablet Take 750 mg by mouth 2 (two) times daily as needed for mild pain.   Yes Historical Provider, MD  nystatin cream (MYCOSTATIN) Apply 1 application topically 2 (two) times daily.   Yes Historical Provider, MD  oxybutynin (DITROPAN) 5 MG tablet Take 5 mg by mouth every morning.    Yes Historical Provider, MD  pantoprazole (PROTONIX) 40 MG tablet Take 40 mg by mouth every morning.   Yes Historical Provider, MD  simvastatin (ZOCOR) 10 MG tablet Take 10 mg by mouth at bedtime.   Yes Historical Provider, MD  cephALEXin (KEFLEX) 500 MG capsule Take 1 capsule (500 mg total) by mouth 4 (four) times daily. 04/29/15   Comer Locket, PA-C  Guaifenesin 1200 MG TB12 Take 1 tablet (1,200 mg total) by mouth 2 (two) times daily. Patient not taking: Reported on 04/29/2015 07/01/14   Dalia Heading, PA-C  phenazopyridine (PYRIDIUM) 95 MG tablet Take 1 tablet (95 mg total) by mouth 3 (three) times daily as needed for pain. 04/29/15   Comer Locket, PA-C  predniSONE (DELTASONE) 50 MG tablet Take 1 tablet (50 mg total) by mouth daily. Patient not taking: Reported on 02/10/2015 07/01/14   Dalia Heading, PA-C   BP 125/75 mmHg  Pulse 72  Temp(Src) 98.2 F (36.8 C) (Oral)  Resp 20  SpO2 100% Physical Exam  Constitutional: She is oriented to person, place, and time. She appears well-developed and well-nourished.  Obese  HENT:  Head: Normocephalic and atraumatic.  Mouth/Throat: Oropharynx is clear and moist.  Eyes: Conjunctivae are normal. Pupils are equal, round, and reactive to light. Right eye exhibits no discharge. Left eye exhibits no discharge. No scleral icterus.  Neck: Normal range of motion. Neck supple.  Cardiovascular: Normal rate, regular rhythm and normal heart sounds.   Pulmonary/Chest: Effort normal and breath sounds normal. No  respiratory distress. She has no wheezes. She has no rales.  Abdominal: Soft. She exhibits no distension and no mass. There is no tenderness. There is no rebound and no guarding.  Musculoskeletal: Normal range of motion. She exhibits no tenderness.  Neurological: She is alert and oriented to person, place, and time.  Cranial Nerves II-XII grossly intact. Moves all extremities without ataxia. Gait is baseline  Skin: Skin is warm and dry. No rash noted.  Psychiatric: She has a normal mood and affect.  Nursing note and vitals reviewed.   ED Course  Procedures (including critical care time) Labs Review Labs Reviewed  URINALYSIS, ROUTINE W REFLEX MICROSCOPIC - Abnormal; Notable for  the following:    APPearance CLOUDY (*)    Leukocytes, UA MODERATE (*)    All other components within normal limits  URINE CULTURE  URINE MICROSCOPIC-ADD ON    Imaging Review No results found.   EKG Interpretation None     Meds given in ED:  Medications  cephALEXin (KEFLEX) capsule 1,000 mg (not administered)  HYDROcodone-acetaminophen (NORCO/VICODIN) 5-325 MG per tablet 2 tablet (not administered)    New Prescriptions   CEPHALEXIN (KEFLEX) 500 MG CAPSULE    Take 1 capsule (500 mg total) by mouth 4 (four) times daily.   PHENAZOPYRIDINE (PYRIDIUM) 95 MG TABLET    Take 1 tablet (95 mg total) by mouth 3 (three) times daily as needed for pain.   Filed Vitals:   04/29/15 0606  BP: 125/75  Pulse: 72  Temp: 98.2 F (36.8 C)  Resp: 20    MDM  Vitals stable - WNL -afebrile Pt resting comfortably in ED. PE--normal abdominal exam. Grossly benign physical exam. Labwork--evidence of UTI on urinalysis. Will treat empirically and obtain urine culture.  DDX--- will treat UTI, Pyridium. No evidence of other acute or emergent pathology at this time. Takes Norco at home for generalized arthritis, given 2 tabs in ED. No evidence of other acute or emergent pathology at this time. Patient is stable for  discharge with primary care.  I discussed all relevant lab findings and imaging results with pt and they verbalized understanding. Discussed f/u with PCP within 48 hrs and return precautions, pt very amenable to plan. Prior to patient discharge, I discussed and reviewed this case with Dr. Sharol Given  Final diagnoses:  Dysuria        Comer Locket, PA-C 04/29/15 [id number]  Linton Flemings, MD 04/30/15 [phone]

## 2015-04-29 NOTE — ED Notes (Signed)
Patient states she is having burning urination x1-2 weeks. States she believes she has an urinary tract infection. Patient also c/o left shoulder pain.

## 2015-04-29 NOTE — ED Notes (Signed)
Per EMS, patient from [facility]. Patient c/o dysuria x2 weeks. Patient denies blood in urine. Patient A&Ox4, NAD.

## 2015-04-29 NOTE — Discharge Instructions (Signed)
Dysuria Dysuria is the medical term for pain with urination. There are many causes for dysuria, but urinary tract infection is the most common. If a urinalysis was performed it can show that there is a urinary tract infection. A urine culture confirms that you or your child is sick. You will need to follow up with a healthcare provider because:  If a urine culture was done you will need to know the culture results and treatment recommendations.  If the urine culture was positive, you or your child will need to be put on antibiotics or know if the antibiotics prescribed are the right antibiotics for your urinary tract infection.  If the urine culture is negative (no urinary tract infection), then other causes may need to be explored or antibiotics need to be stopped. Today laboratory work may have been done and there does not seem to be an infection. If cultures were done they will take at least 24 to 48 hours to be completed. Today x-rays may have been taken and they read as normal. No cause can be found for the problems. The x-rays may be re-read by a radiologist and you will be contacted if additional findings are made. You or your child may have been put on medications to help with this problem until you can see your primary caregiver. If the problems get better, see your primary caregiver if the problems return. If you were given antibiotics (medications which kill germs), take all of the mediations as directed for the full course of treatment.  If laboratory work was done, you need to find the results. Leave a telephone number where you can be reached. If this is not possible, make sure you find out how you are to get test results. HOME CARE INSTRUCTIONS   Drink lots of fluids. For adults, drink eight, 8 ounce glasses of clear juice or water a day. For children, replace fluids as suggested by your caregiver.  Empty the bladder often. Avoid holding urine for long periods of time.  After a bowel  movement, women should cleanse front to back, using each tissue only once.  Empty your bladder before and after sexual intercourse.  Take all the medicine given to you until it is gone. You may feel better in a few days, but TAKE ALL MEDICINE.  Avoid caffeine, tea, alcohol and carbonated beverages, because they tend to irritate the bladder.  In men, alcohol may irritate the prostate.  Only take over-the-counter or prescription medicines for pain, discomfort, or fever as directed by your caregiver.  If your caregiver has given you a follow-up appointment, it is very important to keep that appointment. Not keeping the appointment could result in a chronic or permanent injury, pain, and disability. If there is any problem keeping the appointment, you must call back to this facility for assistance. SEEK IMMEDIATE MEDICAL CARE IF:   Back pain develops.  A fever develops.  There is nausea (feeling sick to your stomach) or vomiting (throwing up).  Problems are no better with medications or are getting worse. MAKE SURE YOU:   Understand these instructions.  Will watch your condition.  Will get help right away if you are not doing well or get worse. Document Released: 09/08/2004 Document Revised: 03/04/2012 Document Reviewed: 07/16/2008 [facility] Patient Information 2015 [place], [place]. This information is not intended to replace advice given to you by your health care provider. Make sure you discuss any questions you have with your health care provider.  You were evaluated  in the ED today for your burning urination. Your found to have a UTI. You'll be treated with antibiotics and medicines to help with the burning. Return to ED for worsening symptoms. Follow-up with your primary care for further evaluation and management of your symptoms.

## 2015-05-02 LAB — URINE CULTURE
Colony Count: 60000
SPECIAL REQUESTS: NORMAL

## 2015-05-03 ENCOUNTER — Telehealth (HOSPITAL_COMMUNITY): Payer: Self-pay

## 2015-05-03 NOTE — ED Notes (Signed)
Post ED Visit - Positive Culture Follow-up: Chart Hand-off to ED Flow Manager  Culture assessed and recommendations reviewed by: []  Wes Dover, Pharm.D., BCPS []  Heide Guile, Pharm.D., BCPS []  Alycia Rossetti, Pharm.D., BCPS []  [place], Pharm.D., BCPS, AAHIVP []  Legrand Como, Pharm .D., BCPS, AAHIVP []  Elicia Lamp, Pharm.D. Tegan magsam pharm D Positive urine culture  []  Patient discharged without antimicrobial prescription and treatment is now indicated [x]  Organism is resistant to prescribed ED discharge antimicrobial []  Patient with positive blood cultures  Changes discussed with ED provider: Jaquita Folds [facility] New antibiotic prescription stop keflex start Amoxicillin 500mg  po bid x 7 days.  Attempting to contact pt   Ileene Musa 05/03/2015, 10:40 AM

## 2015-05-03 NOTE — Progress Notes (Signed)
ED Antimicrobial Stewardship Positive Culture Follow Up   Katherine Cooper is an 74 y.o. female who presented to [facility] on 04/29/2015 with a chief complaint of dysuria x 2 weeks.  She was found to be afebrile and without systemic symptoms.  Pt released w/ Rx for Keflex >> culture positive for entercoccus which is not covered by Keflex.  Chief Complaint  Patient presents with  . Dysuria    x2 weeks    Recent Results (from the past 720 hour(s))  Urine culture     Status: None   Collection Time: 04/29/15  6:28 AM  Result Value Ref Range Status   Specimen Description URINE, CLEAN CATCH  Final   Special Requests Normal  Final   Colony Count   Final    60,000 COLONIES/ML Performed at [facility]    Culture   Final    ENTEROCOCCUS SPECIES Performed at [facility]    Report Status 05/02/2015 FINAL  Final   Organism ID, Bacteria ENTEROCOCCUS SPECIES  Final      Susceptibility   Enterococcus species - MIC*    AMPICILLIN <=2 SENSITIVE Sensitive     LEVOFLOXACIN 2 SENSITIVE Sensitive     NITROFURANTOIN <=16 SENSITIVE Sensitive     VANCOMYCIN 2 SENSITIVE Sensitive     TETRACYCLINE <=1 SENSITIVE Sensitive     * ENTEROCOCCUS SPECIES    [x]  Treated with Keflex, organism resistant to prescribed antimicrobial  New antibiotic prescription: Amoxicillin 500mg  BID x 7 days [facility]  ED Provider: Jaquita Folds, PA-C   Marsella Suman K 05/03/2015, 8:55 AM Infectious Diseases Pharmacist Phone# [phone]

## 2015-05-04 ENCOUNTER — Telehealth (HOSPITAL_BASED_OUTPATIENT_CLINIC_OR_DEPARTMENT_OTHER): Payer: Self-pay | Admitting: Emergency Medicine

## 2015-05-11 ENCOUNTER — Encounter (HOSPITAL_COMMUNITY): Payer: Self-pay | Admitting: Emergency Medicine

## 2015-05-11 ENCOUNTER — Emergency Department (INDEPENDENT_AMBULATORY_CARE_PROVIDER_SITE_OTHER)
Admission: EM | Admit: 2015-05-11 | Discharge: 2015-05-11 | Disposition: A | Discharge status: Home / Self Care | Payer: Medicare HMO | Source: Home / Self Care | Attending: Family Medicine | Admitting: Family Medicine

## 2015-05-11 DIAGNOSIS — N184 Chronic kidney disease, stage 4 (severe): Secondary | ICD-10-CM | POA: Diagnosis not present

## 2015-05-11 DIAGNOSIS — I159 Secondary hypertension, unspecified: Secondary | ICD-10-CM | POA: Diagnosis not present

## 2015-05-11 DIAGNOSIS — N189 Chronic kidney disease, unspecified: Secondary | ICD-10-CM

## 2015-05-11 DIAGNOSIS — D631 Anemia in chronic kidney disease: Secondary | ICD-10-CM

## 2015-05-11 DIAGNOSIS — F419 Anxiety disorder, unspecified: Secondary | ICD-10-CM

## 2015-05-11 NOTE — ED Provider Notes (Signed)
CSN: [id number]     Arrival date & time 05/11/15  1128 History   First MD Initiated Contact with Patient 05/11/15 1409     Chief Complaint  Patient presents with  . Medication Refill   (Consider location/radiation/quality/duration/timing/severity/associated sxs/prior Treatment) HPI Comments: 74 year old female with multiple diagnoses including CAD, [facility] ED, CVA, anemia of chronic disease, dementia is here to make sure that we would be able to see her on a regular basis and refill her medicines every month. She was sent over by another physician. She apparently had been seen at [facility] care and was told recently that they would no longer be able to see her. She received a full month's supply medications last week so she is not out of medications now and does not need a refill. She has no complaints for today.   Past Medical History  Diagnosis Date  . Arthritis   . Hypertension   . Gout   . Obesity   . Angina   . Heart murmur   . Chronic kidney disease   . Stroke   . Recurrent upper respiratory infection (URI)   . Hypothyroidism   . Blood transfusion   . Headache(784.0)   . Anxiety   . Dysrhythmia   . Anemia   . CAP (community acquired pneumonia)   . Alzheimer disease   . Dementia   . Shortness of breath 05/26/2013  . DDD (degenerative disc disease), lumbar   . Breast cancer 10/22/2012   Past Surgical History  Procedure Laterality Date  . Uterine fibroid surgery     Family History  Problem Relation Age of Onset  . Asthma Mother   . Heart attack Father    History  Substance Use Topics  . Smoking status: Former Smoker -- 0.25 packs/day    Types: Cigarettes    Quit date: 03/13/2012  . Smokeless tobacco: Not on file  . Alcohol Use: No   OB History    No data available     Review of Systems  Constitutional: Negative for fever, activity change and fatigue.  HENT: Negative.   Respiratory: Negative for cough and shortness of breath.   Cardiovascular: Negative for chest  pain.  Gastrointestinal: Negative.   Musculoskeletal: Positive for myalgias and back pain.  Skin: Negative.   All other systems reviewed and are negative.   Allergies  Sulfa antibiotics  Home Medications   Prior to Admission medications   Medication Sig Start Date End Date Taking? Authorizing Provider  albuterol (PROVENTIL HFA;VENTOLIN HFA) 108 (90 BASE) MCG/ACT inhaler Inhale 2 puffs into the lungs every 4 (four) hours as needed for wheezing or shortness of breath. 07/01/14   Dalia Heading, PA-C  allopurinol (ZYLOPRIM) 300 MG tablet Take 300 mg by mouth 2 (two) times daily.    Historical Provider, MD  aspirin EC 325 MG tablet Take 325 mg by mouth every morning.     Historical Provider, MD  carvedilol (COREG) 3.125 MG tablet Take 3.125 mg by mouth 2 (two) times daily with a meal.     Historical Provider, MD  cephALEXin (KEFLEX) 500 MG capsule Take 1 capsule (500 mg total) by mouth 4 (four) times daily. 04/29/15   Comer Locket, PA-C  cetirizine (ZYRTEC) 10 MG tablet Take 10 mg by mouth every morning.     Historical Provider, MD  clorazepate (TRANXENE) 7.5 MG tablet Take 7.5 mg by mouth 2 (two) times daily.     Historical Provider, MD  colchicine 0.6 MG tablet Take 0.6 mg  by mouth every morning.     Historical Provider, MD  docusate sodium (COLACE) 100 MG capsule Take 100 mg by mouth 2 (two) times daily.    Historical Provider, MD  donepezil (ARICEPT) 10 MG tablet Take 10 mg by mouth at bedtime.     Historical Provider, MD  ferrous sulfate 325 (65 FE) MG tablet Take 325 mg by mouth daily with breakfast.    Historical Provider, MD  fluticasone (FLONASE) 50 MCG/ACT nasal spray Place 2 sprays into both nostrils at bedtime.    Historical Provider, MD  furosemide (LASIX) 40 MG tablet Take 40 mg by mouth 2 (two) times daily.     Historical Provider, MD  guaifenesin (ROBITUSSIN) 100 MG/5ML syrup Take 200 mg by mouth every 4 (four) hours as needed for cough.    Historical Provider, MD   Guaifenesin 1200 MG TB12 Take 1 tablet (1,200 mg total) by mouth 2 (two) times daily. Patient not taking: Reported on 04/29/2015 07/01/14   Dalia Heading, PA-C  HYDROcodone-acetaminophen (NORCO/VICODIN) 5-325 MG per tablet Take 1 tablet by mouth 3 (three) times daily as needed for moderate pain.     Historical Provider, MD  Memantine HCl ER (NAMENDA XR) 28 MG CP24 Take 1 capsule by mouth every morning.     Historical Provider, MD  nabumetone (RELAFEN) 750 MG tablet Take 750 mg by mouth 2 (two) times daily as needed for mild pain.    Historical Provider, MD  nystatin cream (MYCOSTATIN) Apply 1 application topically 2 (two) times daily.    Historical Provider, MD  oxybutynin (DITROPAN) 5 MG tablet Take 5 mg by mouth every morning.     Historical Provider, MD  pantoprazole (PROTONIX) 40 MG tablet Take 40 mg by mouth every morning.    Historical Provider, MD  phenazopyridine (PYRIDIUM) 95 MG tablet Take 1 tablet (95 mg total) by mouth 3 (three) times daily as needed for pain. 04/29/15   Comer Locket, PA-C  predniSONE (DELTASONE) 50 MG tablet Take 1 tablet (50 mg total) by mouth daily. Patient not taking: Reported on 02/10/2015 07/01/14   Dalia Heading, PA-C  simvastatin (ZOCOR) 10 MG tablet Take 10 mg by mouth at bedtime.    Historical Provider, MD   BP 138/80 mmHg  Pulse 88  Temp(Src) 98.2 F (36.8 C) (Oral)  Resp 16  SpO2 100% Physical Exam  Constitutional: She is oriented to person, place, and time. She appears well-developed and well-nourished. No distress.  Neck: Normal range of motion.  Cardiovascular: Normal rate.   Pulmonary/Chest: Effort normal. No respiratory distress.  Musculoskeletal: Normal range of motion.  Neurological: She is alert and oriented to person, place, and time. She exhibits normal muscle tone.  Skin: Skin is warm and dry. No erythema.  Psychiatric: Her behavior is normal.  Nursing note and vitals reviewed.   ED Course  Procedures (including critical care  time) Labs Review Labs Reviewed - No data to display  Imaging Review No results found.   MDM   1. CKD (chronic kidney disease) stage 4, GFR 15-29 ml/min   2. Secondary hypertension, unspecified   3. Anemia in chronic renal disease   4. Anxiety    Patient is looking for a primary care provider to be able to prescribe her monthly medications. She was advised to something that we do not do in the urgent care. She is not out of medications now. She has an appointment with what sounds like to be any wellness Center in 3 months however she  is wanting to see a physician sooner. She has insurance. She is given a list of physicians that she may be able to call to get a appointment sooner. For worsening, acute problems or exacerbations may return or go to the emergency department.   Janne Napoleon, NP 05/11/15 [phone]

## 2015-05-11 NOTE — ED Notes (Signed)
Pt here for refill of medication.   States "I have an appt. At the wellness center in three months which is there first available'.

## 2015-05-11 NOTE — Discharge Instructions (Signed)
Call the list of doctors given to you today for appointment. For acute problems may return.

## 2015-06-28 ENCOUNTER — Telehealth (HOSPITAL_COMMUNITY): Payer: Self-pay

## 2015-06-28 NOTE — ED Notes (Signed)
Unable to contact pt by mail or telephone. Unable to communicate lab results or treatment changes. 

## 2016-02-22 ENCOUNTER — Emergency Department (HOSPITAL_COMMUNITY): Payer: Medicare HMO

## 2016-02-22 ENCOUNTER — Encounter (HOSPITAL_COMMUNITY): Payer: Self-pay | Admitting: Emergency Medicine

## 2016-02-22 ENCOUNTER — Emergency Department (HOSPITAL_COMMUNITY)
Admission: EM | Admit: 2016-02-22 | Discharge: 2016-02-22 | Disposition: A | Discharge status: Home / Self Care | Payer: Medicare HMO | Attending: Emergency Medicine | Admitting: Emergency Medicine

## 2016-02-22 DIAGNOSIS — Z862 Personal history of diseases of the blood and blood-forming organs and certain disorders involving the immune mechanism: Secondary | ICD-10-CM | POA: Diagnosis not present

## 2016-02-22 DIAGNOSIS — F0280 Dementia in other diseases classified elsewhere without behavioral disturbance: Secondary | ICD-10-CM | POA: Insufficient documentation

## 2016-02-22 DIAGNOSIS — I25119 Atherosclerotic heart disease of native coronary artery with unspecified angina pectoris: Secondary | ICD-10-CM | POA: Insufficient documentation

## 2016-02-22 DIAGNOSIS — Z79899 Other long term (current) drug therapy: Secondary | ICD-10-CM | POA: Diagnosis not present

## 2016-02-22 DIAGNOSIS — F419 Anxiety disorder, unspecified: Secondary | ICD-10-CM | POA: Diagnosis not present

## 2016-02-22 DIAGNOSIS — R011 Cardiac murmur, unspecified: Secondary | ICD-10-CM | POA: Diagnosis not present

## 2016-02-22 DIAGNOSIS — J069 Acute upper respiratory infection, unspecified: Secondary | ICD-10-CM | POA: Insufficient documentation

## 2016-02-22 DIAGNOSIS — I129 Hypertensive chronic kidney disease with stage 1 through stage 4 chronic kidney disease, or unspecified chronic kidney disease: Secondary | ICD-10-CM | POA: Insufficient documentation

## 2016-02-22 DIAGNOSIS — Z853 Personal history of malignant neoplasm of breast: Secondary | ICD-10-CM | POA: Diagnosis not present

## 2016-02-22 DIAGNOSIS — E669 Obesity, unspecified: Secondary | ICD-10-CM | POA: Diagnosis not present

## 2016-02-22 DIAGNOSIS — Z7951 Long term (current) use of inhaled steroids: Secondary | ICD-10-CM | POA: Diagnosis not present

## 2016-02-22 DIAGNOSIS — Z87891 Personal history of nicotine dependence: Secondary | ICD-10-CM | POA: Diagnosis not present

## 2016-02-22 DIAGNOSIS — Z8701 Personal history of pneumonia (recurrent): Secondary | ICD-10-CM | POA: Insufficient documentation

## 2016-02-22 DIAGNOSIS — R0789 Other chest pain: Secondary | ICD-10-CM | POA: Diagnosis not present

## 2016-02-22 DIAGNOSIS — Z8673 Personal history of transient ischemic attack (TIA), and cerebral infarction without residual deficits: Secondary | ICD-10-CM | POA: Diagnosis not present

## 2016-02-22 DIAGNOSIS — R079 Chest pain, unspecified: Secondary | ICD-10-CM | POA: Diagnosis present

## 2016-02-22 DIAGNOSIS — G309 Alzheimer's disease, unspecified: Secondary | ICD-10-CM | POA: Insufficient documentation

## 2016-02-22 DIAGNOSIS — N189 Chronic kidney disease, unspecified: Secondary | ICD-10-CM | POA: Insufficient documentation

## 2016-02-22 DIAGNOSIS — M1990 Unspecified osteoarthritis, unspecified site: Secondary | ICD-10-CM | POA: Diagnosis not present

## 2016-02-22 LAB — BASIC METABOLIC PANEL
Anion gap: 13 (ref 5–15)
BUN: 27 mg/dL — AB (ref 6–20)
CALCIUM: 9.2 mg/dL (ref 8.9–10.3)
CO2: 24 mmol/L (ref 22–32)
CREATININE: 1.92 mg/dL — AB (ref 0.44–1.00)
Chloride: 104 mmol/L (ref 101–111)
GFR calc non Af Amer: 24 mL/min — ABNORMAL LOW (ref >60)
GFR, EST AFRICAN AMERICAN: 28 mL/min — AB (ref >60)
Glucose, Bld: 120 mg/dL — ABNORMAL HIGH (ref 65–99)
Potassium: 3.8 mmol/L (ref 3.5–5.1)
SODIUM: 141 mmol/L (ref 135–145)

## 2016-02-22 LAB — I-STAT TROPONIN, ED
TROPONIN I, POC: 0 ng/mL (ref 0.00–0.08)
TROPONIN I, POC: 0 ng/mL (ref 0.00–0.08)

## 2016-02-22 LAB — CBC
HCT: 31.8 % — ABNORMAL LOW (ref 36.0–46.0)
Hemoglobin: 9.7 g/dL — ABNORMAL LOW (ref 12.0–15.0)
MCH: 25.5 pg — ABNORMAL LOW (ref 26.0–34.0)
MCHC: 30.5 g/dL (ref 30.0–36.0)
MCV: 83.7 fL (ref 78.0–100.0)
PLATELETS: 330 10*3/uL (ref 150–400)
RBC: 3.8 MIL/uL — AB (ref 3.87–5.11)
RDW: 13.4 % (ref 11.5–15.5)
WBC: 7.4 10*3/uL (ref 4.0–10.5)

## 2016-02-22 LAB — URINE MICROSCOPIC-ADD ON: RBC / HPF: NONE SEEN RBC/hpf (ref 0–5)

## 2016-02-22 LAB — URINALYSIS, ROUTINE W REFLEX MICROSCOPIC
Bilirubin Urine: NEGATIVE
GLUCOSE, UA: NEGATIVE mg/dL
HGB URINE DIPSTICK: NEGATIVE
KETONES UR: NEGATIVE mg/dL
Nitrite: NEGATIVE
PH: 5.5 (ref 5.0–8.0)
PROTEIN: NEGATIVE mg/dL
Specific Gravity, Urine: 1.012 (ref 1.005–1.030)

## 2016-02-22 MED ORDER — ACETAMINOPHEN-CODEINE #3 300-30 MG PO TABS
1.0000 | ORAL_TABLET | Freq: Once | ORAL | Status: AC
  Administered 2016-02-22: 1 via ORAL
  Filled 2016-02-22: qty 1

## 2016-02-22 MED ORDER — ACETAMINOPHEN-CODEINE #3 300-30 MG PO TABS: 1.0000 | ORAL_TABLET | Freq: Four times a day (QID) | ORAL | Status: DC | PRN

## 2016-02-22 NOTE — ED Provider Notes (Signed)
CSN: [id number]     Arrival date & time 02/22/16  0325 History   First MD Initiated Contact with Patient 02/22/16 0344     Chief Complaint  Patient presents with  . Chest Pain     (Consider location/radiation/quality/duration/timing/severity/associated sxs/prior Treatment) HPI Comments: Pt with hx of HTN, angina, CKD, stroke, dementia comes in with cc of chest pain. Pt reports that she has been having chest pain x 2 days. Chest pain is L sided, 8/10, worse with movement and with cough and deep breathing. She had recent uri/viral infection. Reports pain is L sided chest and by the shoulder. She has no pain when she doesn't breath deep or cough. She has no associated dib, nausea, sweats. Pt denies fevers. + generalized body aches. Also c/o hand pain L side which started yday. No trauma.   ROS 10 Systems reviewed and are negative for acute change except as noted in the HPI.     Patient is a 75 y.o. female presenting with chest pain. The history is provided by the patient and medical records.  Chest Pain   Past Medical History  Diagnosis Date  . Arthritis   . Hypertension   . Gout   . Obesity   . Angina   . Heart murmur   . Chronic kidney disease   . Stroke ([place])   . Recurrent upper respiratory infection (URI)   . Hypothyroidism   . Blood transfusion   . Headache(784.0)   . Anxiety   . Dysrhythmia   . Anemia   . CAP (community acquired pneumonia)   . Alzheimer disease   . Dementia   . Shortness of breath 05/26/2013  . DDD (degenerative disc disease), lumbar   . Breast cancer ([place]) 10/22/2012   Past Surgical History  Procedure Laterality Date  . Uterine fibroid surgery     Family History  Problem Relation Age of Onset  . Asthma Mother   . Heart attack Father    Social History  Substance Use Topics  . Smoking status: Former Smoker -- 0.25 packs/day    Types: Cigarettes    Quit date: 03/13/2012  . Smokeless tobacco: None  . Alcohol Use: No   OB History    No  data available     Review of Systems  Cardiovascular: Positive for chest pain.      Allergies  Sulfa antibiotics  Home Medications   Prior to Admission medications   Medication Sig Start Date End Date Taking? Authorizing Provider  acetaminophen (TYLENOL) 500 MG tablet Take 500 mg by mouth every 8 (eight) hours as needed for mild pain.   Yes Historical Provider, MD  cetirizine (ZYRTEC) 10 MG tablet Take 10 mg by mouth daily.    Yes Historical Provider, MD  clorazepate (TRANXENE) 7.5 MG tablet Take 7.5 mg by mouth 3 (three) times daily.    Yes Historical Provider, MD  donepezil (ARICEPT) 10 MG tablet Take 10 mg by mouth at bedtime.    Yes Historical Provider, MD  fluticasone (FLONASE) 50 MCG/ACT nasal spray Place 2 sprays into both nostrils daily.    Yes Historical Provider, MD  guaifenesin (ROBITUSSIN) 100 MG/5ML syrup Take 200 mg by mouth every 4 (four) hours as needed for cough.   Yes Historical Provider, MD  HYDROcodone-acetaminophen (NORCO) 7.5-325 MG tablet Take 1 tablet by mouth 3 (three) times daily.   Yes Historical Provider, MD  Memantine HCl ER (NAMENDA XR) 28 MG CP24 Take 1 capsule by mouth every morning.  Yes Historical Provider, MD  nabumetone (RELAFEN) 750 MG tablet Take 750 mg by mouth 2 (two) times daily.    Yes Historical Provider, MD  oxybutynin (DITROPAN) 5 MG tablet Take 5 mg by mouth 2 (two) times daily.    Yes Historical Provider, MD  pantoprazole (PROTONIX) 40 MG tablet Take 40 mg by mouth 2 (two) times daily.    Yes Historical Provider, MD  Propylene Glycol (SYSTANE BALANCE) 0.6 % SOLN Place 1 drop into both eyes 2 (two) times daily.   Yes Historical Provider, MD  simvastatin (ZOCOR) 10 MG tablet Take 10 mg by mouth at bedtime.   Yes Historical Provider, MD  albuterol (PROVENTIL HFA;VENTOLIN HFA) 108 (90 BASE) MCG/ACT inhaler Inhale 2 puffs into the lungs every 4 (four) hours as needed for wheezing or shortness of breath. Patient not taking: Reported on  02/22/2016 07/01/14   Dalia Heading, PA-C  cephALEXin (KEFLEX) 500 MG capsule Take 1 capsule (500 mg total) by mouth 4 (four) times daily. Patient not taking: Reported on 02/22/2016 04/29/15   Comer Locket, PA-C  Guaifenesin 1200 MG TB12 Take 1 tablet (1,200 mg total) by mouth 2 (two) times daily. Patient not taking: Reported on 04/29/2015 07/01/14   Dalia Heading, PA-C  phenazopyridine (PYRIDIUM) 95 MG tablet Take 1 tablet (95 mg total) by mouth 3 (three) times daily as needed for pain. Patient not taking: Reported on 02/22/2016 04/29/15   Comer Locket, PA-C  predniSONE (DELTASONE) 50 MG tablet Take 1 tablet (50 mg total) by mouth daily. Patient not taking: Reported on 02/10/2015 07/01/14   Dalia Heading, PA-C   BP 127/73 mmHg  Pulse 87  Temp(Src) 98.4 F (36.9 C) (Oral)  Resp 18  Ht 5\' 4"  (1.626 m)  Wt 215 lb (97.523 kg)  BMI 36.89 kg/m2  SpO2 98% Physical Exam  Constitutional: She is oriented to person, place, and time. She appears well-developed.  HENT:  Head: Normocephalic and atraumatic.  Eyes: Conjunctivae and EOM are normal. Pupils are equal, round, and reactive to light.  Neck: Normal range of motion. Neck supple. No JVD present.  Cardiovascular: Normal rate, regular rhythm and normal heart sounds.   Pulmonary/Chest: Effort normal and breath sounds normal. No respiratory distress. She has no wheezes. She exhibits tenderness.  Pain worse with palpation  Abdominal: Soft. Bowel sounds are normal. She exhibits no distension. There is no tenderness. There is no rebound and no guarding.  Musculoskeletal: She exhibits no edema or tenderness.  Neurological: She is alert and oriented to person, place, and time.  Skin: Skin is warm and dry.  Nursing note and vitals reviewed.   ED Course  Procedures (including critical care time) Labs Review Labs Reviewed  BASIC METABOLIC PANEL - Abnormal; Notable for the following:    Glucose, Bld 120 (*)    BUN 27 (*)    Creatinine,  Ser 1.92 (*)    GFR calc non Af Amer 24 (*)    GFR calc Af Amer 28 (*)    All other components within normal limits  CBC - Abnormal; Notable for the following:    RBC 3.80 (*)    Hemoglobin 9.7 (*)    HCT 31.8 (*)    MCH 25.5 (*)    All other components within normal limits  I-STAT TROPOININ, ED  Randolm Idol, ED    Imaging Review Dg Chest 2 View  02/22/2016  CLINICAL DATA:  LEFT chest pain, shoulder pain. On treatment for flu with cough. EXAM: CHEST  2  VIEW COMPARISON:  Chest radiograph February 10, 2015 FINDINGS: The cardiac silhouette is mildly enlarged unchanged. Tortuous aorta can be seen with chronic hypertension. Mild bronchitic change without pleural effusion or focal consolidation. No pneumothorax. Soft tissue planes and included osseous structures are nonsuspicious. Moderate air-filled hiatal hernia IMPRESSION: Stable mild cardiomegaly and mild bronchitic changes. Electronically Signed   By: Elon Alas M.D.   On: 02/22/2016 05:01   I have personally reviewed and evaluated these images and lab results as part of my medical decision-making.   EKG Interpretation   Date/Time:  Tuesday February 22 2016 03:40:42 EST Ventricular Rate:  80 PR Interval:  192 QRS Duration: 88 QT Interval:  402 QTC Calculation: 464 R Axis:   6 Text Interpretation:  Sinus rhythm Anteroseptal infarct, old No  significant change since last tracing Confirmed by Kathrynn Humble, MD, Thelma Comp  [phone]) on 02/22/2016 4:27:14 AM Also confirmed by Jermy Couper, MD, Cordai Rodrigue  [phone]), editor [place]  [facility], [place] ([id number])  on 02/22/2016 7:09:44 AM      MDM   Final diagnoses:  URI (upper respiratory infection)  Atypical chest pain    Pt comes in with L sided chest pain. She has hx of angina, and cardiac risk factors and is > 65. HEAR score is 4 based on risk factors and age alone. However, the pain is very atypical, intermittent, worse with cough and deep inspiration and reproducible with palpation. It is L  sided, and feels "heavy like bronchitis." EKG shows no acute changes. No concerning constitutionals.  Basic labs ordered. Case discussed with Dr. Terrence Dupont to see if he can see pt in the clinic tomorrow- and he is happy to see her. I think with the pain being atypical and almost pleuritic/uri like - if trops x 2 are neg, and we can establish an appt, we can reassure good outcome.  Pt comfortable with the plan. Requests tylenol#3 and reminder on her paperwork to see Dr. Terrence Dupont tomorrow. Strict ER return precautions discussed.    Varney Biles, MD 02/22/16 [phone]

## 2016-02-22 NOTE — Discharge Instructions (Signed)
We saw you in the ER for the chest pain. All of our cardiac workup is normal, including labs, EKG and chest X-RAY are normal. We are not sure what is causing your discomfort, but we feel comfortable sending you home at this time. The workup in the ER is not complete, and you should follow up with your primary care doctor for further evaluation.  SEE DR. HARWANI TOMORROW- PLEASE CALL AND SET UP AN APPOINTMENT.   Cough, Adult Coughing is a reflex that clears your throat and your airways. Coughing helps to heal and protect your lungs. It is normal to cough occasionally, but a cough that happens with other symptoms or lasts a long time may be a sign of a condition that needs treatment. A cough may last only 2-3 weeks (acute), or it may last longer than 8 weeks (chronic). CAUSES Coughing is commonly caused by:  Breathing in substances that irritate your lungs.  A viral or bacterial respiratory infection.  Allergies.  Asthma.  Postnasal drip.  Smoking.  Acid backing up from the stomach into the esophagus (gastroesophageal reflux).  Certain medicines.  Chronic lung problems, including COPD (or rarely, lung cancer).  Other medical conditions such as heart failure. HOME CARE INSTRUCTIONS  Pay attention to any changes in your symptoms. Take these actions to help with your discomfort:  Take medicines only as told by your health care provider.  If you were prescribed an antibiotic medicine, take it as told by your health care provider. Do not stop taking the antibiotic even if you start to feel better.  Talk with your health care provider before you take a cough suppressant medicine.  Drink enough fluid to keep your urine clear or pale yellow.  If the air is dry, use a cold steam vaporizer or humidifier in your bedroom or your home to help loosen secretions.  Avoid anything that causes you to cough at work or at home.  If your cough is worse at night, try sleeping in a semi-upright  position.  Avoid cigarette smoke. If you smoke, quit smoking. If you need help quitting, ask your health care provider.  Avoid caffeine.  Avoid alcohol.  Rest as needed. SEEK MEDICAL CARE IF:   You have new symptoms.  You cough up pus.  Your cough does not get better after 2-3 weeks, or your cough gets worse.  You cannot control your cough with suppressant medicines and you are losing sleep.  You develop pain that is getting worse or pain that is not controlled with pain medicines.  You have a fever.  You have unexplained weight loss.  You have night sweats. SEEK IMMEDIATE MEDICAL CARE IF:  You cough up blood.  You have difficulty breathing.  Your heartbeat is very fast.   This information is not intended to replace advice given to you by your health care provider. Make sure you discuss any questions you have with your health care provider.   Document Released: 06/09/2011 Document Revised: 09/01/2015 Document Reviewed: 02/17/2015 Elsevier Interactive Patient Education 2016 Elsevier Inc.   Upper Respiratory Infection, Adult Most upper respiratory infections (URIs) are a viral infection of the air passages leading to the lungs. A URI affects the nose, throat, and upper air passages. The most common type of URI is nasopharyngitis and is typically referred to as "the common cold." URIs run their course and usually go away on their own. Most of the time, a URI does not require medical attention, but sometimes a bacterial infection  in the upper airways can follow a viral infection. This is called a secondary infection. Sinus and middle ear infections are common types of secondary upper respiratory infections. Bacterial pneumonia can also complicate a URI. A URI can worsen asthma and chronic obstructive pulmonary disease (COPD). Sometimes, these complications can require emergency medical care and may be life threatening.  CAUSES Almost all URIs are caused by viruses. A virus  is a type of germ and can spread from one person to another.  RISKS FACTORS You may be at risk for a URI if:   You smoke.   You have chronic heart or lung disease.  You have a weakened defense (immune) system.   You are very young or very old.   You have nasal allergies or asthma.  You work in crowded or poorly ventilated areas.  You work in health care facilities or schools. SIGNS AND SYMPTOMS  Symptoms typically develop 2-3 days after you come in contact with a cold virus. Most viral URIs last 7-10 days. However, viral URIs from the influenza virus (flu virus) can last 14-18 days and are typically more severe. Symptoms may include:   Runny or stuffy (congested) nose.   Sneezing.   Cough.   Sore throat.   Headache.   Fatigue.   Fever.   Loss of appetite.   Pain in your forehead, behind your eyes, and over your cheekbones (sinus pain).  Muscle aches.  DIAGNOSIS  Your health care provider may diagnose a URI by:  Physical exam.  Tests to check that your symptoms are not due to another condition such as:  Strep throat.  Sinusitis.  Pneumonia.  Asthma. TREATMENT  A URI goes away on its own with time. It cannot be cured with medicines, but medicines may be prescribed or recommended to relieve symptoms. Medicines may help:  Reduce your fever.  Reduce your cough.  Relieve nasal congestion. HOME CARE INSTRUCTIONS   Take medicines only as directed by your health care provider.   Gargle warm saltwater or take cough drops to comfort your throat as directed by your health care provider.  Use a warm mist humidifier or inhale steam from a shower to increase air moisture. This may make it easier to breathe.  Drink enough fluid to keep your urine clear or pale yellow.   Eat soups and other clear broths and maintain good nutrition.   Rest as needed.   Return to work when your temperature has returned to normal or as your health care provider  advises. You may need to stay home longer to avoid infecting others. You can also use a face mask and careful hand washing to prevent spread of the virus.  Increase the usage of your inhaler if you have asthma.   Do not use any tobacco products, including cigarettes, chewing tobacco, or electronic cigarettes. If you need help quitting, ask your health care provider. PREVENTION  The best way to protect yourself from getting a cold is to practice good hygiene.   Avoid oral or hand contact with people with cold symptoms.   Wash your hands often if contact occurs.  There is no clear evidence that vitamin C, vitamin E, echinacea, or exercise reduces the chance of developing a cold. However, it is always recommended to get plenty of rest, exercise, and practice good nutrition.  SEEK MEDICAL CARE IF:   You are getting worse rather than better.   Your symptoms are not controlled by medicine.   You have chills.  You have worsening shortness of breath.  You have brown or red mucus.  You have yellow or brown nasal discharge.  You have pain in your face, especially when you bend forward.  You have a fever.  You have swollen neck glands.  You have pain while swallowing.  You have white areas in the back of your throat. SEEK IMMEDIATE MEDICAL CARE IF:   You have severe or persistent:  Headache.  Ear pain.  Sinus pain.  Chest pain.  You have chronic lung disease and any of the following:  Wheezing.  Prolonged cough.  Coughing up blood.  A change in your usual mucus.  You have a stiff neck.  You have changes in your:  Vision.  Hearing.  Thinking.  Mood. MAKE SURE YOU:   Understand these instructions.  Will watch your condition.  Will get help right away if you are not doing well or get worse.   This information is not intended to replace advice given to you by your health care provider. Make sure you discuss any questions you have with your health care  provider.   Document Released: 06/06/2001 Document Revised: 04/27/2015 Document Reviewed: 03/18/2014 Elsevier Interactive Patient Education 2016 Elsevier Inc.  Nonspecific Chest Pain  Chest pain can be caused by many different conditions. There is always a chance that your pain could be related to something serious, such as a heart attack or a blood clot in your lungs. Chest pain can also be caused by conditions that are not life-threatening. If you have chest pain, it is very important to follow up with your health care provider. CAUSES  Chest pain can be caused by:  Heartburn.  Pneumonia or bronchitis.  Anxiety or stress.  Inflammation around your heart (pericarditis) or lung (pleuritis or pleurisy).  A blood clot in your lung.  A collapsed lung (pneumothorax). It can develop suddenly on its own (spontaneous pneumothorax) or from trauma to the chest.  Shingles infection (varicella-zoster virus).  Heart attack.  Damage to the bones, muscles, and cartilage that make up your chest wall. This can include:  Bruised bones due to injury.  Strained muscles or cartilage due to frequent or repeated coughing or overwork.  Fracture to one or more ribs.  Sore cartilage due to inflammation (costochondritis). RISK FACTORS  Risk factors for chest pain may include:  Activities that increase your risk for trauma or injury to your chest.  Respiratory infections or conditions that cause frequent coughing.  Medical conditions or overeating that can cause heartburn.  Heart disease or family history of heart disease.  Conditions or health behaviors that increase your risk of developing a blood clot.  Having had chicken pox (varicella zoster). SIGNS AND SYMPTOMS Chest pain can feel like:  Burning or tingling on the surface of your chest or deep in your chest.  Crushing, pressure, aching, or squeezing pain.  Dull or sharp pain that is worse when you move, cough, or take a deep  breath.  Pain that is also felt in your back, neck, shoulder, or arm, or pain that spreads to any of these areas. Your chest pain may come and go, or it may stay constant. DIAGNOSIS Lab tests or other studies may be needed to find the cause of your pain. Your health care provider may have you take a test called an ambulatory ECG (electrocardiogram). An ECG records your heartbeat patterns at the time the test is performed. You may also have other tests, such as:  Transthoracic echocardiogram (  TTE). During echocardiography, sound waves are used to create a picture of all of the heart structures and to look at how blood flows through your heart.  Transesophageal echocardiogram (TEE).This is a more advanced imaging test that obtains images from inside your body. It allows your health care provider to see your heart in finer detail.  Cardiac monitoring. This allows your health care provider to monitor your heart rate and rhythm in real time.  Holter monitor. This is a portable device that records your heartbeat and can help to diagnose abnormal heartbeats. It allows your health care provider to track your heart activity for several days, if needed.  Stress tests. These can be done through exercise or by taking medicine that makes your heart beat more quickly.  Blood tests.  Imaging tests. TREATMENT  Your treatment depends on what is causing your chest pain. Treatment may include:  Medicines. These may include:  Acid blockers for heartburn.  Anti-inflammatory medicine.  Pain medicine for inflammatory conditions.  Antibiotic medicine, if an infection is present.  Medicines to dissolve blood clots.  Medicines to treat coronary artery disease.  Supportive care for conditions that do not require medicines. This may include:  Resting.  Applying heat or cold packs to injured areas.  Limiting activities until pain decreases. HOME CARE INSTRUCTIONS  If you were prescribed an  antibiotic medicine, finish it all even if you start to feel better.  Avoid any activities that bring on chest pain.  Do not use any tobacco products, including cigarettes, chewing tobacco, or electronic cigarettes. If you need help quitting, ask your health care provider.  Do not drink alcohol.  Take medicines only as directed by your health care provider.  Keep all follow-up visits as directed by your health care provider. This is important. This includes any further testing if your chest pain does not go away.  If heartburn is the cause for your chest pain, you may be told to keep your head raised (elevated) while sleeping. This reduces the chance that acid will go from your stomach into your esophagus.  Make lifestyle changes as directed by your health care provider. These may include:  Getting regular exercise. Ask your health care provider to suggest some activities that are safe for you.  Eating a heart-healthy diet. A registered dietitian can help you to learn healthy eating options.  Maintaining a healthy weight.  Managing diabetes, if necessary.  Reducing stress. SEEK MEDICAL CARE IF:  Your chest pain does not go away after treatment.  You have a rash with blisters on your chest.  You have a fever. SEEK IMMEDIATE MEDICAL CARE IF:   Your chest pain is worse.  You have an increasing cough, or you cough up blood.  You have severe abdominal pain.  You have severe weakness.  You faint.  You have chills.  You have sudden, unexplained chest discomfort.  You have sudden, unexplained discomfort in your arms, back, neck, or jaw.  You have shortness of breath at any time.  You suddenly start to sweat, or your skin gets clammy.  You feel nauseous or you vomit.  You suddenly feel light-headed or dizzy.  Your heart begins to beat quickly, or it feels like it is skipping beats. These symptoms may represent a serious problem that is an emergency. Do not wait to  see if the symptoms will go away. Get medical help right away. Call your local emergency services (911 in the [place] Do not drive yourself to the  hospital.   This information is not intended to replace advice given to you by your health care provider. Make sure you discuss any questions you have with your health care provider.   Document Released: 09/20/2005 Document Revised: 01/01/2015 Document Reviewed: 07/17/2014 Elsevier Interactive Patient Education [employer].

## 2016-02-22 NOTE — ED Provider Notes (Signed)
Please see previous physician's note regarding patient's presenting history and physical, initial ED course and associated MDM. In short, this is 75 year old female who presents with atypical chest pain. Has had unremarkable EKG troponin and cxr in ED. Previous provider had disussed with her cardiologist, Dr. Terrence Dupont who recommended one day follow-up in office. Currently chest pain free. Repeat troponin negative. Appropriate for discharge home. Strict return and follow-up instructions reviewed. She expressed understanding of all discharge instructions and felt comfortable with the plan of care.   Forde Dandy, MD 02/22/16 [phone]

## 2016-02-22 NOTE — ED Notes (Signed)
Pt brought to ED by [facility] from [facility] for 8/10 left CP and shoulder pain, pt feeling congested, recently treated for flu still having some coughing. CP gets worse with movement, deep breathing and coughing, pt denies nausea, vomiting or fever. Pt resting on bed NAD noticed. VS bp 132/80, hr 83, r19, spo2 97% ra. NSR on EMS EKG.

## 2016-03-01 ENCOUNTER — Other Ambulatory Visit: Payer: Self-pay | Admitting: Cardiology

## 2016-03-01 DIAGNOSIS — R079 Chest pain, unspecified: Secondary | ICD-10-CM

## 2016-03-08 ENCOUNTER — Encounter (HOSPITAL_COMMUNITY): Payer: Medicare HMO

## 2016-03-08 ENCOUNTER — Ambulatory Visit (HOSPITAL_COMMUNITY): Payer: Medicare HMO

## 2016-03-08 ENCOUNTER — Other Ambulatory Visit (HOSPITAL_COMMUNITY): Payer: Medicare HMO

## 2016-03-10 ENCOUNTER — Other Ambulatory Visit (HOSPITAL_COMMUNITY): Payer: Medicare HMO

## 2016-03-10 ENCOUNTER — Ambulatory Visit (HOSPITAL_COMMUNITY): Payer: Medicare HMO

## 2016-03-13 ENCOUNTER — Encounter (HOSPITAL_COMMUNITY)
Admission: RE | Admit: 2016-03-13 | Discharge: 2016-03-13 | Disposition: A | Discharge status: Home / Self Care | Payer: Medicare HMO | Source: Ambulatory Visit | Attending: Cardiology | Admitting: Cardiology

## 2016-03-13 DIAGNOSIS — R079 Chest pain, unspecified: Secondary | ICD-10-CM | POA: Insufficient documentation

## 2016-03-13 MED ORDER — TECHNETIUM TC 99M SESTAMIBI GENERIC - CARDIOLITE
10.0000 | Freq: Once | INTRAVENOUS | Status: AC | PRN
  Administered 2016-03-13: 10 via INTRAVENOUS

## 2016-03-13 MED ORDER — TECHNETIUM TC 99M SESTAMIBI GENERIC - CARDIOLITE
30.0000 | Freq: Once | INTRAVENOUS | Status: AC | PRN
  Administered 2016-03-13: 30 via INTRAVENOUS

## 2016-03-13 MED ORDER — REGADENOSON 0.4 MG/5ML IV SOLN
INTRAVENOUS | Status: AC
  Filled 2016-03-13: qty 5

## 2016-03-13 MED ORDER — REGADENOSON 0.4 MG/5ML IV SOLN
0.4000 mg | Freq: Once | INTRAVENOUS | Status: AC
  Administered 2016-03-13: 0.4 mg via INTRAVENOUS

## 2016-06-28 ENCOUNTER — Institutional Professional Consult (permissible substitution): Payer: Medicare HMO | Admitting: Internal Medicine

## 2016-06-29 ENCOUNTER — Institutional Professional Consult (permissible substitution): Payer: Medicare HMO | Admitting: Internal Medicine

## 2016-09-21 ENCOUNTER — Encounter (HOSPITAL_COMMUNITY): Payer: Self-pay | Admitting: Emergency Medicine

## 2016-09-21 ENCOUNTER — Emergency Department (HOSPITAL_COMMUNITY)
Admission: EM | Admit: 2016-09-21 | Discharge: 2016-09-21 | Disposition: A | Discharge status: Home / Self Care | Payer: Medicare HMO | Attending: Emergency Medicine | Admitting: Emergency Medicine

## 2016-09-21 DIAGNOSIS — I129 Hypertensive chronic kidney disease with stage 1 through stage 4 chronic kidney disease, or unspecified chronic kidney disease: Secondary | ICD-10-CM | POA: Insufficient documentation

## 2016-09-21 DIAGNOSIS — R600 Localized edema: Secondary | ICD-10-CM

## 2016-09-21 DIAGNOSIS — M25512 Pain in left shoulder: Secondary | ICD-10-CM

## 2016-09-21 DIAGNOSIS — Z7982 Long term (current) use of aspirin: Secondary | ICD-10-CM | POA: Insufficient documentation

## 2016-09-21 DIAGNOSIS — M25511 Pain in right shoulder: Secondary | ICD-10-CM | POA: Diagnosis not present

## 2016-09-21 DIAGNOSIS — G309 Alzheimer's disease, unspecified: Secondary | ICD-10-CM | POA: Diagnosis not present

## 2016-09-21 DIAGNOSIS — Z853 Personal history of malignant neoplasm of breast: Secondary | ICD-10-CM | POA: Diagnosis not present

## 2016-09-21 DIAGNOSIS — N184 Chronic kidney disease, stage 4 (severe): Secondary | ICD-10-CM | POA: Diagnosis not present

## 2016-09-21 DIAGNOSIS — M79604 Pain in right leg: Secondary | ICD-10-CM

## 2016-09-21 DIAGNOSIS — M79605 Pain in left leg: Secondary | ICD-10-CM

## 2016-09-21 DIAGNOSIS — M79671 Pain in right foot: Secondary | ICD-10-CM | POA: Diagnosis present

## 2016-09-21 DIAGNOSIS — E039 Hypothyroidism, unspecified: Secondary | ICD-10-CM | POA: Insufficient documentation

## 2016-09-21 DIAGNOSIS — F0281 Dementia in other diseases classified elsewhere with behavioral disturbance: Secondary | ICD-10-CM | POA: Diagnosis not present

## 2016-09-21 DIAGNOSIS — Z8673 Personal history of transient ischemic attack (TIA), and cerebral infarction without residual deficits: Secondary | ICD-10-CM | POA: Insufficient documentation

## 2016-09-21 DIAGNOSIS — Z87891 Personal history of nicotine dependence: Secondary | ICD-10-CM | POA: Diagnosis not present

## 2016-09-21 MED ORDER — FENTANYL CITRATE (PF) 100 MCG/2ML IJ SOLN
25.0000 ug | Freq: Once | INTRAMUSCULAR | Status: AC
  Administered 2016-09-21: 25 ug via INTRAMUSCULAR
  Filled 2016-09-21: qty 2

## 2016-09-21 NOTE — ED Triage Notes (Signed)
Pt transported by EMS from [facility] with c/o bilat leg pain, shoulder and elbow pain. Pt states pain worsening over last 2 months, pt is to move to another SNF today and would like pain medication changed.

## 2016-09-21 NOTE — ED Notes (Signed)
ptar at bedside. 
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
## 2016-09-21 NOTE — ED Provider Notes (Signed)
[facility] DEPT Provider Note   CSN: [id number] Arrival date & time: 09/21/16  0631     History   Chief Complaint Chief Complaint  Patient presents with  . Leg Pain    HPI Katherine Cooper is a 75 y.o. female.  The history is provided by the patient.  Leg Pain   The current episode started 2 days ago. The problem occurs constantly. The problem has been gradually worsening. The pain is present in the right shoulder, left shoulder, left foot and right foot. Associated symptoms include stiffness. Pertinent negatives include no numbness and full range of motion. She has tried OTC pain medications for the symptoms. The treatment provided mild relief. There has been no history of extremity trauma. Family history is significant for gout.   She reports over-doing it for the past few days as she is packing her room w/o help at the facility to move in with her daughter. Patient denies any recent falls or trauma.  Reports leg swelling since her PCP has taken her off of her fluid pill.  Past Medical History:  Diagnosis Date  . Alzheimer disease   . Anemia   . Angina   . Anxiety   . Arthritis   . Blood transfusion   . Breast cancer ([place]) 10/22/2012  . CAP (community acquired pneumonia)   . Chronic kidney disease   . DDD (degenerative disc disease), lumbar   . Dementia   . Dysrhythmia   . Gout   . Headache(784.0)   . Heart murmur   . Hypertension   . Hypothyroidism   . Obesity   . Recurrent upper respiratory infection (URI)   . Shortness of breath 05/26/2013  . Stroke [facility])     Patient Active Problem List   Diagnosis Date Noted  . Chest pain 10/12/2013  . Palpitations 10/12/2013  . Acute bronchitis 10/12/2013  . ARF (acute renal failure) ([place]) 10/12/2013  . Shortness of breath 05/26/2013  . Breast cancer ([place]) 10/22/2012  . Dehydration 07/22/2012  . Chronic pain 07/22/2012  . Anemia 07/22/2012  . Hypotension 07/21/2012  . Hip pain 07/21/2012  . Anxiety   . Elbow  pain 07/09/2012  . PNA (pneumonia) 07/08/2012  . Hyperkalemia 07/08/2012  . HTN (hypertension) 07/08/2012  . CKD (chronic kidney disease) stage 4, GFR 15-29 ml/min (HCC) 07/08/2012  . Chest pain at rest 11/12/2011    Class: Acute  . Bilateral leg edema 11/12/2011    Class: Chronic  . Obesity (BMI 30-39.9) 11/12/2011    Class: Chronic  . Gout 11/12/2011    Class: Chronic    Past Surgical History:  Procedure Laterality Date  . UTERINE FIBROID SURGERY      OB History    No data available       Home Medications    Prior to Admission medications   Medication Sig Start Date End Date Taking? Authorizing Provider  acetaminophen (TYLENOL) 500 MG tablet Take 500 mg by mouth every 8 (eight) hours as needed for mild pain.   Yes Historical Provider, MD  aspirin EC 81 MG tablet Take 81 mg by mouth daily.   Yes Historical Provider, MD  carvedilol (COREG) 3.125 MG tablet Take 3.125 mg by mouth 2 (two) times daily with a meal.   Yes Historical Provider, MD  cetirizine (ZYRTEC) 10 MG tablet Take 10 mg by mouth daily.    Yes Historical Provider, MD  clorazepate (TRANXENE) 7.5 MG tablet Take 7.5 mg by mouth 3 (three) times daily.  Yes Historical Provider, MD  dextromethorphan-guaiFENesin (ROBITUSSIN-DM) 10-100 MG/5ML liquid Take 10 mLs by mouth every 6 (six) hours as needed for cough.   Yes Historical Provider, MD  donepezil (ARICEPT) 10 MG tablet Take 10 mg by mouth at bedtime.    Yes Historical Provider, MD  fluticasone (FLONASE) 50 MCG/ACT nasal spray Place 2 sprays into both nostrils daily.    Yes Historical Provider, MD  HYDROcodone-acetaminophen (NORCO) 7.5-325 MG tablet Take 1 tablet by mouth 3 (three) times daily.   Yes Historical Provider, MD  ketoconazole (NIZORAL) 2 % cream Apply 1 application topically 2 (two) times daily as needed for irritation.   Yes Historical Provider, MD  Memantine HCl ER (NAMENDA XR) 28 MG CP24 Take 1 capsule by mouth every morning.    Yes Historical  Provider, MD  nabumetone (RELAFEN) 750 MG tablet Take 750 mg by mouth 2 (two) times daily.    Yes Historical Provider, MD  oxybutynin (DITROPAN) 5 MG tablet Take 5 mg by mouth 2 (two) times daily.    Yes Historical Provider, MD  pantoprazole (PROTONIX) 40 MG tablet Take 40 mg by mouth 2 (two) times daily.    Yes Historical Provider, MD  promethazine (PHENERGAN) 25 MG tablet Take 25 mg by mouth every 6 (six) hours as needed for nausea or vomiting.   Yes Historical Provider, MD  Propylene Glycol (SYSTANE BALANCE) 0.6 % SOLN Place 1 drop into both eyes 2 (two) times daily.   Yes Historical Provider, MD  QUEtiapine (SEROQUEL) 25 MG tablet Take 25 mg by mouth at bedtime.   Yes Historical Provider, MD  simvastatin (ZOCOR) 10 MG tablet Take 10 mg by mouth at bedtime.   Yes Historical Provider, MD    Family History Family History  Problem Relation Age of Onset  . Asthma Mother   . Heart attack Father     Social History Social History  Substance Use Topics  . Smoking status: Former Smoker    Packs/day: 0.25    Types: Cigarettes    Quit date: 03/13/2012  . Smokeless tobacco: Never Used  . Alcohol use No     Allergies   Sulfa antibiotics   Review of Systems Review of Systems  Constitutional: Negative for chills and fever.  HENT: Negative for ear pain and sore throat.   Eyes: Negative for pain and visual disturbance.  Respiratory: Negative for cough and shortness of breath.   Cardiovascular: Positive for leg swelling. Negative for chest pain and palpitations.  Gastrointestinal: Negative for abdominal pain and vomiting.  Genitourinary: Negative for dysuria and hematuria.  Musculoskeletal: Positive for stiffness. Negative for arthralgias and back pain.  Skin: Negative for color change and rash.  Neurological: Negative for seizures, syncope and numbness.  All other systems reviewed and are negative.    Physical Exam Updated Vital Signs BP 153/91 (BP Location: Right Arm)   Pulse 93    Temp 98.2 F (36.8 C) (Oral)   Resp 18   Ht 5\' 4"  (1.626 m)   Wt 230 lb (104.3 kg)   SpO2 99%   BMI 39.48 kg/m   Physical Exam  Constitutional: She is oriented to person, place, and time. She appears well-developed and well-nourished. No distress.  HENT:  Head: Normocephalic and atraumatic.  Nose: Nose normal.  Eyes: Conjunctivae and EOM are normal. Pupils are equal, round, and reactive to light. Right eye exhibits no discharge. Left eye exhibits no discharge. No scleral icterus.  Neck: Normal range of motion. Neck supple.  Cardiovascular:  Normal rate and regular rhythm.  Exam reveals no gallop and no friction rub.   No murmur heard. Pulmonary/Chest: Effort normal and breath sounds normal. No stridor. No respiratory distress. She has no rales.  Abdominal: Soft. She exhibits no distension. There is no tenderness.  Musculoskeletal: She exhibits no edema.       Right shoulder: She exhibits tenderness. She exhibits normal range of motion, no swelling, no effusion, no crepitus, normal pulse and normal strength.       Left shoulder: She exhibits tenderness. She exhibits normal range of motion, no swelling, no effusion and no crepitus.  2+ BLE edema with mild hyperemia. nontender to light touch.  Neurological: She is alert and oriented to person, place, and time.  Skin: Skin is warm and dry. No rash noted. She is not diaphoretic. No erythema.  Psychiatric: She has a normal mood and affect.  Vitals reviewed.    ED Treatments / Results  Labs (all labs ordered are listed, but only abnormal results are displayed) Labs Reviewed - No data to display  EKG  EKG Interpretation None       Radiology No results found.  Procedures Procedures (including critical care time)  Medications Ordered in ED Medications  fentaNYL (SUBLIMAZE) injection 25 mcg (25 mcg Intramuscular Given 09/21/16 0811)     Initial Impression / Assessment and Plan / ED Course  I have reviewed the triage  vital signs and the nursing notes.  Pertinent labs & imaging results that were available during my care of the patient were reviewed by me and considered in my medical decision making (see chart for details).  Clinical Course    1. Lower extremity edema Consistent with patient's chronic edema. Patient has been off her Lasix due to a PCP instructions. There is mild hyperemia but no evidence of cellulitis. Low suspicion for DVT. Next  2. Bilateral shoulder pain Consistent with exacerbation of patient's arthritis. Denies any recent traumas. Her presentation is highly inconsistent with ACS. Provided with low-dose fentanyl. Instructed to follow-up with PCP for continued management.  For discharge with strict return precautions.  Final Clinical Impressions(s) / ED Diagnoses   Final diagnoses:  Bilateral leg pain  Bilateral edema of lower extremity  Bilateral shoulder pain   Disposition: Discharge  Condition: Good  I have discussed the results, Dx and Tx plan with the patient who expressed understanding and agree(s) with the plan. Discharge instructions discussed at great length. The patient was given strict return precautions who verbalized understanding of the instructions. No further questions at time of discharge.    Current Discharge Medication List      Follow Up: Nolene Ebbs, MD [place] [address] [phone]  Schedule an appointment as soon as possible for a visit in 4 days For close follow up to assess for restarting diuretics for lower extremity edema.      Fatima Blank, MD 09/21/16 [phone]

## 2016-09-21 NOTE — ED Notes (Signed)
Requested Network engineer to call [facility].

## 2016-09-21 NOTE — ED Notes (Signed)
MD at bedside. 

## 2016-10-25 ENCOUNTER — Telehealth (INDEPENDENT_AMBULATORY_CARE_PROVIDER_SITE_OTHER): Payer: Self-pay | Admitting: Orthopedic Surgery

## 2016-10-25 NOTE — Telephone Encounter (Signed)
Patient called needing something prescribed for joint pain. Patient said the Rx she is taking is not working. Patient said she is hurting and can not lift her arms. Patient said she uses [facility]  [phone]  Patient said [facility] will deliver.   The number to contact her is [phone]

## 2016-10-26 NOTE — Telephone Encounter (Signed)
I have tried to call patient at number listed.  It is not accepting incoming calls right now. I need to know what medication she is taking.

## 2016-10-27 NOTE — Telephone Encounter (Signed)
I called again, still not accepting incoming phone calls. Will try again later.

## 2016-11-01 NOTE — Telephone Encounter (Signed)
I tried to call again, still not accepting incoming calls, signing off on this note.  Will address if pt calls [place] again.

## 2016-11-29 ENCOUNTER — Emergency Department (HOSPITAL_COMMUNITY)
Admission: EM | Admit: 2016-11-29 | Discharge: 2016-11-29 | Disposition: A | Discharge status: Home / Self Care | Payer: Medicare HMO | Attending: Emergency Medicine | Admitting: Emergency Medicine

## 2016-11-29 ENCOUNTER — Encounter (HOSPITAL_COMMUNITY): Payer: Self-pay

## 2016-11-29 ENCOUNTER — Emergency Department (HOSPITAL_COMMUNITY): Payer: Medicare HMO

## 2016-11-29 DIAGNOSIS — E039 Hypothyroidism, unspecified: Secondary | ICD-10-CM | POA: Insufficient documentation

## 2016-11-29 DIAGNOSIS — N184 Chronic kidney disease, stage 4 (severe): Secondary | ICD-10-CM | POA: Insufficient documentation

## 2016-11-29 DIAGNOSIS — I2510 Atherosclerotic heart disease of native coronary artery without angina pectoris: Secondary | ICD-10-CM | POA: Diagnosis not present

## 2016-11-29 DIAGNOSIS — R600 Localized edema: Secondary | ICD-10-CM | POA: Diagnosis not present

## 2016-11-29 DIAGNOSIS — Z7982 Long term (current) use of aspirin: Secondary | ICD-10-CM | POA: Diagnosis not present

## 2016-11-29 DIAGNOSIS — R52 Pain, unspecified: Secondary | ICD-10-CM

## 2016-11-29 DIAGNOSIS — M791 Myalgia: Secondary | ICD-10-CM | POA: Diagnosis not present

## 2016-11-29 DIAGNOSIS — Z853 Personal history of malignant neoplasm of breast: Secondary | ICD-10-CM | POA: Diagnosis not present

## 2016-11-29 DIAGNOSIS — I129 Hypertensive chronic kidney disease with stage 1 through stage 4 chronic kidney disease, or unspecified chronic kidney disease: Secondary | ICD-10-CM | POA: Insufficient documentation

## 2016-11-29 DIAGNOSIS — Z87891 Personal history of nicotine dependence: Secondary | ICD-10-CM | POA: Insufficient documentation

## 2016-11-29 DIAGNOSIS — F0280 Dementia in other diseases classified elsewhere without behavioral disturbance: Secondary | ICD-10-CM | POA: Insufficient documentation

## 2016-11-29 DIAGNOSIS — E1122 Type 2 diabetes mellitus with diabetic chronic kidney disease: Secondary | ICD-10-CM | POA: Insufficient documentation

## 2016-11-29 DIAGNOSIS — G309 Alzheimer's disease, unspecified: Secondary | ICD-10-CM | POA: Insufficient documentation

## 2016-11-29 DIAGNOSIS — Z8673 Personal history of transient ischemic attack (TIA), and cerebral infarction without residual deficits: Secondary | ICD-10-CM | POA: Diagnosis not present

## 2016-11-29 DIAGNOSIS — R609 Edema, unspecified: Secondary | ICD-10-CM

## 2016-11-29 LAB — CBC
HEMATOCRIT: 28.8 % — AB (ref 36.0–46.0)
HEMOGLOBIN: 8.6 g/dL — AB (ref 12.0–15.0)
MCH: 24.9 pg — ABNORMAL LOW (ref 26.0–34.0)
MCHC: 29.9 g/dL — AB (ref 30.0–36.0)
MCV: 83.5 fL (ref 78.0–100.0)
Platelets: 322 10*3/uL (ref 150–400)
RBC: 3.45 MIL/uL — AB (ref 3.87–5.11)
RDW: 16.8 % — ABNORMAL HIGH (ref 11.5–15.5)
WBC: 7.6 10*3/uL (ref 4.0–10.5)

## 2016-11-29 LAB — URINALYSIS, ROUTINE W REFLEX MICROSCOPIC
BACTERIA UA: NONE SEEN
Bilirubin Urine: NEGATIVE
Glucose, UA: NEGATIVE mg/dL
Hgb urine dipstick: NEGATIVE
Ketones, ur: NEGATIVE mg/dL
NITRITE: NEGATIVE
PROTEIN: NEGATIVE mg/dL
SPECIFIC GRAVITY, URINE: 1.012 (ref 1.005–1.030)
pH: 6 (ref 5.0–8.0)

## 2016-11-29 LAB — COMPREHENSIVE METABOLIC PANEL
ALBUMIN: 2.7 g/dL — AB (ref 3.5–5.0)
ALK PHOS: 98 U/L (ref 38–126)
ALT: 9 U/L — ABNORMAL LOW (ref 14–54)
ANION GAP: 9 (ref 5–15)
AST: 15 U/L (ref 15–41)
BILIRUBIN TOTAL: 0.5 mg/dL (ref 0.3–1.2)
BUN: 23 mg/dL — AB (ref 6–20)
CALCIUM: 9.4 mg/dL (ref 8.9–10.3)
CO2: 27 mmol/L (ref 22–32)
CREATININE: 1.61 mg/dL — AB (ref 0.44–1.00)
Chloride: 105 mmol/L (ref 101–111)
GFR calc Af Amer: 35 mL/min — ABNORMAL LOW (ref >60)
GFR calc non Af Amer: 30 mL/min — ABNORMAL LOW (ref >60)
GLUCOSE: 108 mg/dL — AB (ref 65–99)
Potassium: 3.4 mmol/L — ABNORMAL LOW (ref 3.5–5.1)
Sodium: 141 mmol/L (ref 135–145)
TOTAL PROTEIN: 6.7 g/dL (ref 6.5–8.1)

## 2016-11-29 LAB — SEDIMENTATION RATE: SED RATE: 85 mm/h — AB (ref 0–22)

## 2016-11-29 LAB — CK: Total CK: 69 U/L (ref 38–234)

## 2016-11-29 MED ORDER — DIAZEPAM 2 MG PO TABS: 2.0000 mg | ORAL_TABLET | Freq: Once | ORAL | Status: DC

## 2016-11-29 MED ORDER — KETOROLAC TROMETHAMINE 30 MG/ML IJ SOLN: 30.0000 mg | Freq: Once | INTRAMUSCULAR | Status: DC

## 2016-11-29 MED ORDER — DEXAMETHASONE SODIUM PHOSPHATE 10 MG/ML IJ SOLN: 10.0000 mg | Freq: Once | INTRAMUSCULAR | Status: DC

## 2016-11-29 MED ORDER — PREDNISONE 20 MG PO TABS: ORAL_TABLET | ORAL | 0 refills | Status: DC

## 2016-11-29 MED ORDER — FUROSEMIDE 20 MG PO TABS: 20.0000 mg | ORAL_TABLET | Freq: Every day | ORAL | 0 refills | Status: DC

## 2016-11-29 NOTE — ED Notes (Signed)
pts daughter was able to come to the ED and advised she can take patient home, ptar cancelled by ed secretary

## 2016-11-29 NOTE — ED Provider Notes (Signed)
[facility] DEPT Provider Note   CSN: [id number] Arrival date & time: 11/29/16  5701    History   Chief Complaint Chief Complaint  Patient presents with  . Muscle Pain    pain all over     HPI Katherine Cooper is a 75 y.o. female.  75 year old female with multiple chronic medical problems including hypertension, obesity, chronic kidney disease, CVA, DM, CAD, and Alzheimer's dementia presents to the emergency department for complaints of chronic body pain. Patient states that she has been having "pain all over" for the past 2 months. She attributes this pain to a move into an apartment from a nursing facility. She also states, "had to wrestle my phone away from a boy". She states that this is someone who was in the nursing facility with her. Pain worse in the patient's neck and back; hx of back pain since the 1970's, per patient. Patient has been taking her daily pain medication, but states that it is not helping her. She denies a recent falls or direct trauma. She was seen by her primary care doctor a week ago who did not make any changes to her medications. She has not had any fever. She does complain of lower extremity edema, but I suspect this is due to her known history of heart failure. No CP or SOB today.   PCP - Dr. Ayesha Rumpf Cardiology - Dr. Terrence Dupont   The history is provided by the patient. No language interpreter was used.  Muscle Pain     Past Medical History:  Diagnosis Date  . Alzheimer disease   . Anemia   . Angina   . Anxiety   . Arthritis   . Blood transfusion   . Breast cancer ([place]) 10/22/2012  . CAP (community acquired pneumonia)   . Chronic kidney disease   . DDD (degenerative disc disease), lumbar   . Dementia   . Dysrhythmia   . Gout   . Headache(784.0)   . Heart murmur   . Hypertension   . Hypothyroidism   . Obesity   . Recurrent upper respiratory infection (URI)   . Shortness of breath 05/26/2013  . Stroke [facility])     Patient Active Problem List    Diagnosis Date Noted  . Chest pain 10/12/2013  . Palpitations 10/12/2013  . Acute bronchitis 10/12/2013  . ARF (acute renal failure) ([place]) 10/12/2013  . Shortness of breath 05/26/2013  . Breast cancer ([place]) 10/22/2012  . Dehydration 07/22/2012  . Chronic pain 07/22/2012  . Anemia 07/22/2012  . Hypotension 07/21/2012  . Hip pain 07/21/2012  . Anxiety   . Elbow pain 07/09/2012  . PNA (pneumonia) 07/08/2012  . Hyperkalemia 07/08/2012  . HTN (hypertension) 07/08/2012  . CKD (chronic kidney disease) stage 4, GFR 15-29 ml/min (HCC) 07/08/2012  . Chest pain at rest 11/12/2011    Class: Acute  . Bilateral leg edema 11/12/2011    Class: Chronic  . Obesity (BMI 30-39.9) 11/12/2011    Class: Chronic  . Gout 11/12/2011    Class: Chronic    Past Surgical History:  Procedure Laterality Date  . UTERINE FIBROID SURGERY      OB History    No data available       Home Medications    Prior to Admission medications   Medication Sig Start Date End Date Taking? Authorizing Provider  acetaminophen (TYLENOL) 500 MG tablet Take 500 mg by mouth every 8 (eight) hours as needed for mild pain.    Historical Provider, MD  aspirin EC 81 MG tablet Take 81 mg by mouth daily.    Historical Provider, MD  carvedilol (COREG) 3.125 MG tablet Take 3.125 mg by mouth 2 (two) times daily with a meal.    Historical Provider, MD  cetirizine (ZYRTEC) 10 MG tablet Take 10 mg by mouth daily.     Historical Provider, MD  clorazepate (TRANXENE) 7.5 MG tablet Take 7.5 mg by mouth 3 (three) times daily.     Historical Provider, MD  dextromethorphan-guaiFENesin (ROBITUSSIN-DM) 10-100 MG/5ML liquid Take 10 mLs by mouth every 6 (six) hours as needed for cough.    Historical Provider, MD  donepezil (ARICEPT) 10 MG tablet Take 10 mg by mouth at bedtime.     Historical Provider, MD  fluticasone (FLONASE) 50 MCG/ACT nasal spray Place 2 sprays into both nostrils daily.     Historical Provider, MD    HYDROcodone-acetaminophen (NORCO) 7.5-325 MG tablet Take 1 tablet by mouth 3 (three) times daily.    Historical Provider, MD  ketoconazole (NIZORAL) 2 % cream Apply 1 application topically 2 (two) times daily as needed for irritation.    Historical Provider, MD  Memantine HCl ER (NAMENDA XR) 28 MG CP24 Take 1 capsule by mouth every morning.     Historical Provider, MD  nabumetone (RELAFEN) 750 MG tablet Take 750 mg by mouth 2 (two) times daily.     Historical Provider, MD  oxybutynin (DITROPAN) 5 MG tablet Take 5 mg by mouth 2 (two) times daily.     Historical Provider, MD  pantoprazole (PROTONIX) 40 MG tablet Take 40 mg by mouth 2 (two) times daily.     Historical Provider, MD  promethazine (PHENERGAN) 25 MG tablet Take 25 mg by mouth every 6 (six) hours as needed for nausea or vomiting.    Historical Provider, MD  Propylene Glycol (SYSTANE BALANCE) 0.6 % SOLN Place 1 drop into both eyes 2 (two) times daily.    Historical Provider, MD  QUEtiapine (SEROQUEL) 25 MG tablet Take 25 mg by mouth at bedtime.    Historical Provider, MD  simvastatin (ZOCOR) 10 MG tablet Take 10 mg by mouth at bedtime.    Historical Provider, MD    Family History Family History  Problem Relation Age of Onset  . Asthma Mother   . Heart attack Father     Social History Social History  Substance Use Topics  . Smoking status: Former Smoker    Packs/day: 0.25    Types: Cigarettes    Quit date: 03/13/2012  . Smokeless tobacco: Never Used  . Alcohol use No     Allergies   Sulfa antibiotics   Review of Systems Review of Systems Ten systems reviewed and are negative for acute change, except as noted in the HPI.    Physical Exam Updated Vital Signs BP 137/90   Pulse 85   Temp 98 F (36.7 C) (Oral)   Resp 20   SpO2 99%   Physical Exam  Constitutional: She is oriented to person, place, and time. She appears well-developed and well-nourished. No distress.  Nontoxic appearing and in NAD. Morbidly obese.   HENT:  Head: Normocephalic and atraumatic.  Eyes: Conjunctivae and EOM are normal. No scleral icterus.  Neck: Normal range of motion.  Old soft brace applied to neck by patient PTA for comfort. Normal ROM.  Cardiovascular: Normal rate, regular rhythm and intact distal pulses.   Pulmonary/Chest: Effort normal. No respiratory distress. She has no wheezes. She has no rales.  No tachypnea or  dyspnea. Lungs grossly clear bilaterally. Chest expansion symmetric  Musculoskeletal: Normal range of motion. She exhibits edema (2+ pitting in BLE).  Neurological: She is alert and oriented to person, place, and time. She exhibits normal muscle tone. Coordination normal.  GCS 15. Patient moving all extremities.  Skin: Skin is warm and dry. No rash noted. She is not diaphoretic. No erythema. No pallor.  Psychiatric: She has a normal mood and affect. Her behavior is normal.  Nursing note and vitals reviewed.    ED Treatments / Results  Labs (all labs ordered are listed, but only abnormal results are displayed) Labs Reviewed  CBC  COMPREHENSIVE METABOLIC PANEL  CK  URINALYSIS, ROUTINE W REFLEX MICROSCOPIC  SEDIMENTATION RATE    EKG  EKG Interpretation None       Radiology No results found.  Procedures Procedures (including critical care time)  Medications Ordered in ED Medications - No data to display   Initial Impression / Assessment and Plan / ED Course  I have reviewed the triage vital signs and the nursing notes.  Pertinent labs & imaging results that were available during my care of the patient were reviewed by me and considered in my medical decision making (see chart for details).  Clinical Course     5:54 AM Patient back from CT. Results pending. Labs also pending. ESR added to r/o polymyalgia rheumatica. Patient signed out to oncoming provider who will follow pending work up and disposition appropriately.   Final Clinical Impressions(s) / ED Diagnoses   Final  diagnoses:  Body aches    New Prescriptions New Prescriptions   No medications on file     Antonietta Breach, PA-C 12/12/74 8832    Delora Fuel, MD 54/98/26 4158

## 2016-11-29 NOTE — ED Provider Notes (Signed)
Assumed care from [place] at shift change.  See her note for full H&P.  Briefly, 75 y.o. F here with generalized soreness all over for the past 2 months, worse in the neck.  No focal deficits were noted on prior exam.  Patient states she was started on some pain medication by PCP but states it is not helping.  Patient was noted to have edema of her legs without SOB symptoms.  Plan:  Labs and imaging studies pending.  If ESR elevated, plan will be to start steroids and refer back to PCP for ongoing work-up.  Results for orders placed or performed during the hospital encounter of 11/29/16  CBC  Result Value Ref Range   WBC 7.6 4.0 - 10.5 K/uL   RBC 3.45 (L) 3.87 - 5.11 MIL/uL   Hemoglobin 8.6 (L) 12.0 - 15.0 g/dL   HCT 28.8 (L) 36.0 - 46.0 %   MCV 83.5 78.0 - 100.0 fL   MCH 24.9 (L) 26.0 - 34.0 pg   MCHC 29.9 (L) 30.0 - 36.0 g/dL   RDW 16.8 (H) 11.5 - 15.5 %   Platelets 322 150 - 400 K/uL  Comprehensive metabolic panel  Result Value Ref Range   Sodium 141 135 - 145 mmol/L   Potassium 3.4 (L) 3.5 - 5.1 mmol/L   Chloride 105 101 - 111 mmol/L   CO2 27 22 - 32 mmol/L   Glucose, Bld 108 (H) 65 - 99 mg/dL   BUN 23 (H) 6 - 20 mg/dL   Creatinine, Ser 1.61 (H) 0.44 - 1.00 mg/dL   Calcium 9.4 8.9 - 10.3 mg/dL   Total Protein 6.7 6.5 - 8.1 g/dL   Albumin 2.7 (L) 3.5 - 5.0 g/dL   AST 15 15 - 41 U/L   ALT 9 (L) 14 - 54 U/L   Alkaline Phosphatase 98 38 - 126 U/L   Total Bilirubin 0.5 0.3 - 1.2 mg/dL   GFR calc non Af Amer 30 (L) >60 mL/min   GFR calc Af Amer 35 (L) >60 mL/min   Anion gap 9 5 - 15  CK  Result Value Ref Range   Total CK 69 38 - 234 U/L  Urinalysis, Routine w reflex microscopic  Result Value Ref Range   Color, Urine YELLOW YELLOW   APPearance CLEAR CLEAR   Specific Gravity, Urine 1.012 1.005 - 1.030   pH 6.0 5.0 - 8.0   Glucose, UA NEGATIVE NEGATIVE mg/dL   Hgb urine dipstick NEGATIVE NEGATIVE   Bilirubin Urine NEGATIVE NEGATIVE   Ketones, ur NEGATIVE NEGATIVE mg/dL   Protein, ur NEGATIVE NEGATIVE mg/dL   Nitrite NEGATIVE NEGATIVE   Leukocytes, UA TRACE (A) NEGATIVE   RBC / HPF 0-5 0 - 5 RBC/hpf   WBC, UA 0-5 0 - 5 WBC/hpf   Bacteria, UA NONE SEEN NONE SEEN   Squamous Epithelial / LPF 0-5 (A) NONE SEEN   Mucous PRESENT   Sedimentation rate  Result Value Ref Range   Sed Rate 85 (H) 0 - 22 mm/hr   Ct Cervical Spine Wo Contrast  Result Date: 11/29/2016 CLINICAL DATA:  Chronic body pain since 1997, worse tonight after picking up trash. EXAM: CT CERVICAL SPINE WITHOUT CONTRAST TECHNIQUE: Multidetector CT imaging of the cervical spine was performed without intravenous contrast. Multiplanar CT image reconstructions were also generated. COMPARISON:  None. FINDINGS: ALIGNMENT: Maintained lordosis. Vertebral bodies in alignment. SKULL BASE AND VERTEBRAE: Cervical vertebral bodies and posterior elements are intact. Multilevel severe disc height loss, with  irregular endplates, relative sparing of C3-4. C1-2 articulation maintained. Severe osteopenia LEFT humerus. SOFT TISSUES AND SPINAL CANAL: Nonacute. DISC LEVELS: No significant osseous canal stenosis. Moderate RIGHT C5-6 neural foraminal narrowing. UPPER CHEST: Lung apices are clear. OTHER: None. 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 Electronically Signed   By: Elon Alas M.D.   On: 11/29/2016 06:02   CT without acute findings.  Basic labs appears baseline for patient when compared with prior, ESR slightly elevated at 85.  On repeat exam, she is sleeping, in no apparent distress.  Patient does have 2+ pitting edema of the legs, however her lungs are clear and vitals are stable.  She has no complaints of chest pain or SOB. The fluid in her legs may be contributing to her pain.  She is not currently on any lasix or other diuretic.  Will start low dose lasix for a few days as well as steroid taper.  I have recommended that she follow-up closely with her primary care doctor for  ongoing management.  Discussed plan with patient, she acknowledged understanding and agreed with plan of care.  Return precautions given for new or worsening symptoms.   Larene Pickett, PA-C 11/29/16 [place], PA-C 11/29/16 [place], MD 11/29/16 1247

## 2016-11-29 NOTE — Discharge Instructions (Signed)
Take the prescribed medication as directed.  The lasix should help with the fluid in your legs. Follow-up with your primary care doctor. Return to the ED for new or worsening symptoms.

## 2016-11-29 NOTE — ED Triage Notes (Signed)
Per EMS pt from home alone; pt called EMS due to chronic body aches; pt presents with collar on; Pt states pain is chronic since August; pt c/o pain 10/10 on arrival. Pt a&ox 4

## 2016-11-29 NOTE — ED Notes (Signed)
Pt stating she needs an ambulance to transport her home, this RN requesting ED secretary to contact ptar for patient.

## 2017-01-11 ENCOUNTER — Ambulatory Visit (INDEPENDENT_AMBULATORY_CARE_PROVIDER_SITE_OTHER): Payer: Self-pay | Admitting: Orthopedic Surgery

## 2017-01-22 ENCOUNTER — Ambulatory Visit (INDEPENDENT_AMBULATORY_CARE_PROVIDER_SITE_OTHER): Payer: Self-pay | Admitting: Orthopedic Surgery

## 2017-02-05 ENCOUNTER — Emergency Department (HOSPITAL_COMMUNITY): Payer: Medicare Other

## 2017-02-05 ENCOUNTER — Inpatient Hospital Stay (HOSPITAL_COMMUNITY)
Admission: EM | Admit: 2017-02-05 | Discharge: 2017-02-09 | DRG: 871 | Disposition: A | Discharge status: Skilled Nursing Facility | Payer: Medicare Other | Attending: Internal Medicine | Admitting: Internal Medicine

## 2017-02-05 ENCOUNTER — Encounter (HOSPITAL_COMMUNITY): Payer: Self-pay | Admitting: Emergency Medicine

## 2017-02-05 DIAGNOSIS — L97822 Non-pressure chronic ulcer of other part of left lower leg with fat layer exposed: Secondary | ICD-10-CM | POA: Diagnosis present

## 2017-02-05 DIAGNOSIS — N179 Acute kidney failure, unspecified: Secondary | ICD-10-CM | POA: Diagnosis not present

## 2017-02-05 DIAGNOSIS — F0281 Dementia in other diseases classified elsewhere with behavioral disturbance: Secondary | ICD-10-CM

## 2017-02-05 DIAGNOSIS — F419 Anxiety disorder, unspecified: Secondary | ICD-10-CM | POA: Diagnosis present

## 2017-02-05 DIAGNOSIS — I129 Hypertensive chronic kidney disease with stage 1 through stage 4 chronic kidney disease, or unspecified chronic kidney disease: Secondary | ICD-10-CM | POA: Diagnosis not present

## 2017-02-05 DIAGNOSIS — D649 Anemia, unspecified: Secondary | ICD-10-CM | POA: Diagnosis not present

## 2017-02-05 DIAGNOSIS — J189 Pneumonia, unspecified organism: Secondary | ICD-10-CM | POA: Diagnosis not present

## 2017-02-05 DIAGNOSIS — I10 Essential (primary) hypertension: Secondary | ICD-10-CM | POA: Diagnosis not present

## 2017-02-05 DIAGNOSIS — Z79891 Long term (current) use of opiate analgesic: Secondary | ICD-10-CM

## 2017-02-05 DIAGNOSIS — N184 Chronic kidney disease, stage 4 (severe): Secondary | ICD-10-CM | POA: Diagnosis not present

## 2017-02-05 DIAGNOSIS — D6489 Other specified anemias: Secondary | ICD-10-CM | POA: Diagnosis present

## 2017-02-05 DIAGNOSIS — Z8673 Personal history of transient ischemic attack (TIA), and cerebral infarction without residual deficits: Secondary | ICD-10-CM

## 2017-02-05 DIAGNOSIS — Z8249 Family history of ischemic heart disease and other diseases of the circulatory system: Secondary | ICD-10-CM | POA: Diagnosis not present

## 2017-02-05 DIAGNOSIS — E669 Obesity, unspecified: Secondary | ICD-10-CM | POA: Diagnosis present

## 2017-02-05 DIAGNOSIS — Z79899 Other long term (current) drug therapy: Secondary | ICD-10-CM | POA: Diagnosis not present

## 2017-02-05 DIAGNOSIS — M109 Gout, unspecified: Secondary | ICD-10-CM | POA: Diagnosis not present

## 2017-02-05 DIAGNOSIS — Z87891 Personal history of nicotine dependence: Secondary | ICD-10-CM | POA: Diagnosis not present

## 2017-02-05 DIAGNOSIS — M1990 Unspecified osteoarthritis, unspecified site: Secondary | ICD-10-CM | POA: Diagnosis not present

## 2017-02-05 DIAGNOSIS — D72829 Elevated white blood cell count, unspecified: Secondary | ICD-10-CM

## 2017-02-05 DIAGNOSIS — R2681 Unsteadiness on feet: Secondary | ICD-10-CM | POA: Diagnosis not present

## 2017-02-05 DIAGNOSIS — F0280 Dementia in other diseases classified elsewhere without behavioral disturbance: Secondary | ICD-10-CM | POA: Diagnosis present

## 2017-02-05 DIAGNOSIS — R0609 Other forms of dyspnea: Secondary | ICD-10-CM

## 2017-02-05 DIAGNOSIS — Z7982 Long term (current) use of aspirin: Secondary | ICD-10-CM

## 2017-02-05 DIAGNOSIS — M7989 Other specified soft tissue disorders: Secondary | ICD-10-CM | POA: Diagnosis not present

## 2017-02-05 DIAGNOSIS — N3281 Overactive bladder: Secondary | ICD-10-CM | POA: Diagnosis not present

## 2017-02-05 DIAGNOSIS — I872 Venous insufficiency (chronic) (peripheral): Secondary | ICD-10-CM | POA: Diagnosis present

## 2017-02-05 DIAGNOSIS — R600 Localized edema: Secondary | ICD-10-CM | POA: Diagnosis present

## 2017-02-05 DIAGNOSIS — Z7951 Long term (current) use of inhaled steroids: Secondary | ICD-10-CM

## 2017-02-05 DIAGNOSIS — R791 Abnormal coagulation profile: Secondary | ICD-10-CM | POA: Diagnosis present

## 2017-02-05 DIAGNOSIS — Z853 Personal history of malignant neoplasm of breast: Secondary | ICD-10-CM | POA: Diagnosis not present

## 2017-02-05 DIAGNOSIS — G301 Alzheimer's disease with late onset: Secondary | ICD-10-CM | POA: Diagnosis not present

## 2017-02-05 DIAGNOSIS — M6281 Muscle weakness (generalized): Secondary | ICD-10-CM | POA: Diagnosis not present

## 2017-02-05 DIAGNOSIS — R0600 Dyspnea, unspecified: Secondary | ICD-10-CM | POA: Diagnosis not present

## 2017-02-05 DIAGNOSIS — I959 Hypotension, unspecified: Secondary | ICD-10-CM | POA: Diagnosis not present

## 2017-02-05 DIAGNOSIS — Z6837 Body mass index (BMI) 37.0-37.9, adult: Secondary | ICD-10-CM

## 2017-02-05 DIAGNOSIS — Z882 Allergy status to sulfonamides status: Secondary | ICD-10-CM

## 2017-02-05 DIAGNOSIS — R0602 Shortness of breath: Secondary | ICD-10-CM | POA: Diagnosis not present

## 2017-02-05 DIAGNOSIS — E785 Hyperlipidemia, unspecified: Secondary | ICD-10-CM | POA: Diagnosis not present

## 2017-02-05 DIAGNOSIS — E039 Hypothyroidism, unspecified: Secondary | ICD-10-CM | POA: Diagnosis present

## 2017-02-05 DIAGNOSIS — F02818 Dementia in other diseases classified elsewhere, unspecified severity, with other behavioral disturbance: Secondary | ICD-10-CM

## 2017-02-05 DIAGNOSIS — A419 Sepsis, unspecified organism: Secondary | ICD-10-CM | POA: Diagnosis not present

## 2017-02-05 DIAGNOSIS — R278 Other lack of coordination: Secondary | ICD-10-CM | POA: Diagnosis not present

## 2017-02-05 DIAGNOSIS — L03116 Cellulitis of left lower limb: Secondary | ICD-10-CM | POA: Diagnosis present

## 2017-02-05 DIAGNOSIS — K219 Gastro-esophageal reflux disease without esophagitis: Secondary | ICD-10-CM | POA: Diagnosis not present

## 2017-02-05 DIAGNOSIS — N189 Chronic kidney disease, unspecified: Secondary | ICD-10-CM | POA: Diagnosis not present

## 2017-02-05 DIAGNOSIS — R7989 Other specified abnormal findings of blood chemistry: Secondary | ICD-10-CM

## 2017-02-05 HISTORY — DX: Alzheimer's disease with late onset: G30.1

## 2017-02-05 HISTORY — DX: Dementia in other diseases classified elsewhere with behavioral disturbance: F02.81

## 2017-02-05 LAB — BASIC METABOLIC PANEL
Anion gap: 10 (ref 5–15)
BUN: 68 mg/dL — AB (ref 6–20)
CO2: 19 mmol/L — AB (ref 22–32)
Calcium: 10.9 mg/dL — ABNORMAL HIGH (ref 8.9–10.3)
Chloride: 107 mmol/L (ref 101–111)
Creatinine, Ser: 3.28 mg/dL — ABNORMAL HIGH (ref 0.44–1.00)
GFR calc Af Amer: 15 mL/min — ABNORMAL LOW (ref >60)
GFR calc non Af Amer: 13 mL/min — ABNORMAL LOW (ref >60)
GLUCOSE: 136 mg/dL — AB (ref 65–99)
POTASSIUM: 4.9 mmol/L (ref 3.5–5.1)
Sodium: 136 mmol/L (ref 135–145)

## 2017-02-05 LAB — CBC WITH DIFFERENTIAL/PLATELET
BASOS PCT: 0 %
Basophils Absolute: 0 10*3/uL (ref 0.0–0.1)
Eosinophils Absolute: 0.5 10*3/uL (ref 0.0–0.7)
Eosinophils Relative: 3 %
HCT: 28.9 % — ABNORMAL LOW (ref 36.0–46.0)
HEMOGLOBIN: 9.1 g/dL — AB (ref 12.0–15.0)
LYMPHS ABS: 1.7 10*3/uL (ref 0.7–4.0)
LYMPHS PCT: 9 %
MCH: 27.5 pg (ref 26.0–34.0)
MCHC: 31.5 g/dL (ref 30.0–36.0)
MCV: 87.3 fL (ref 78.0–100.0)
MONO ABS: 0.8 10*3/uL (ref 0.1–1.0)
MONOS PCT: 4 %
NEUTROS ABS: 16.8 10*3/uL — AB (ref 1.7–7.7)
NEUTROS PCT: 84 %
Platelets: 430 10*3/uL — ABNORMAL HIGH (ref 150–400)
RBC: 3.31 MIL/uL — ABNORMAL LOW (ref 3.87–5.11)
RDW: 17.3 % — AB (ref 11.5–15.5)
WBC: 19.8 10*3/uL — ABNORMAL HIGH (ref 4.0–10.5)

## 2017-02-05 LAB — URINALYSIS, ROUTINE W REFLEX MICROSCOPIC
Bilirubin Urine: NEGATIVE
GLUCOSE, UA: NEGATIVE mg/dL
HGB URINE DIPSTICK: NEGATIVE
Ketones, ur: NEGATIVE mg/dL
NITRITE: NEGATIVE
PH: 5 (ref 5.0–8.0)
Protein, ur: 30 mg/dL — AB
Specific Gravity, Urine: 1.016 (ref 1.005–1.030)

## 2017-02-05 LAB — BRAIN NATRIURETIC PEPTIDE: B Natriuretic Peptide: 54.6 pg/mL (ref 0.0–100.0)

## 2017-02-05 LAB — I-STAT TROPONIN, ED: Troponin i, poc: 0 ng/mL (ref 0.00–0.08)

## 2017-02-05 LAB — I-STAT CG4 LACTIC ACID, ED: Lactic Acid, Venous: 1.24 mmol/L (ref 0.5–1.9)

## 2017-02-05 LAB — D-DIMER, QUANTITATIVE: D-Dimer, Quant: 3.98 ug/mL-FEU — ABNORMAL HIGH (ref 0.00–0.50)

## 2017-02-05 MED ORDER — SODIUM CHLORIDE 0.9 % IV BOLUS (SEPSIS): 1000.0000 mL | Freq: Once | INTRAVENOUS | Status: DC

## 2017-02-05 NOTE — ED Provider Notes (Signed)
[facility] DEPT Provider Note   CSN: [id number] Arrival date & time: 02/05/17  1634   Level V caveat dementia  History   Chief Complaint Chief Complaint  Patient presents with  . Weakness  . Leg Swelling    HPI Katherine Cooper is a 76 y.o. female.Complains of leg swelling for the past 5 months. She reports that for the past 2 days she's had chest pain and shortness of breath with walking. Pain is anterior. Symptoms accompanied by generalized weakness. Symptoms improved with rest. No other associated symptoms. No treatment prior to coming here.  HPI  Past Medical History:  Diagnosis Date  . Alzheimer disease   . Anemia   . Angina   . Anxiety   . Arthritis   . Blood transfusion   . Breast cancer ([place]) 10/22/2012  . CAP (community acquired pneumonia)   . Chronic kidney disease   . DDD (degenerative disc disease), lumbar   . Dementia   . Dysrhythmia   . Gout   . Headache(784.0)   . Heart murmur   . Hypertension   . Hypothyroidism   . Obesity   . Recurrent upper respiratory infection (URI)   . Shortness of breath 05/26/2013  . Stroke [facility])     Patient Active Problem List   Diagnosis Date Noted  . Chest pain 10/12/2013  . Palpitations 10/12/2013  . Acute bronchitis 10/12/2013  . ARF (acute renal failure) ([place]) 10/12/2013  . Shortness of breath 05/26/2013  . Breast cancer ([place]) 10/22/2012  . Dehydration 07/22/2012  . Chronic pain 07/22/2012  . Anemia 07/22/2012  . Hypotension 07/21/2012  . Hip pain 07/21/2012  . Anxiety   . Elbow pain 07/09/2012  . PNA (pneumonia) 07/08/2012  . Hyperkalemia 07/08/2012  . HTN (hypertension) 07/08/2012  . CKD (chronic kidney disease) stage 4, GFR 15-29 ml/min (HCC) 07/08/2012  . Chest pain at rest 11/12/2011    Class: Acute  . Bilateral leg edema 11/12/2011    Class: Chronic  . Obesity (BMI 30-39.9) 11/12/2011    Class: Chronic  . Gout 11/12/2011    Class: Chronic    Past Surgical History:  Procedure Laterality  Date  . UTERINE FIBROID SURGERY      OB History    No data available       Home Medications    Prior to Admission medications   Medication Sig Start Date End Date Taking? Authorizing Provider  acetaminophen (TYLENOL) 500 MG tablet Take 500 mg by mouth every 8 (eight) hours as needed for mild pain.    Historical Provider, MD  aspirin EC 81 MG tablet Take 81 mg by mouth daily.    Historical Provider, MD  carvedilol (COREG) 3.125 MG tablet Take 3.125 mg by mouth 2 (two) times daily with a meal.    Historical Provider, MD  cetirizine (ZYRTEC) 10 MG tablet Take 10 mg by mouth daily.     Historical Provider, MD  clorazepate (TRANXENE) 7.5 MG tablet Take 7.5 mg by mouth 3 (three) times daily.     Historical Provider, MD  dextromethorphan-guaiFENesin (ROBITUSSIN-DM) 10-100 MG/5ML liquid Take 10 mLs by mouth every 6 (six) hours as needed for cough.    Historical Provider, MD  donepezil (ARICEPT) 10 MG tablet Take 10 mg by mouth at bedtime.     Historical Provider, MD  fluticasone (FLONASE) 50 MCG/ACT nasal spray Place 2 sprays into both nostrils daily.     Historical Provider, MD  furosemide (LASIX) 20 MG tablet Take 1 tablet (  20 mg total) by mouth daily. 11/29/16   Larene Pickett, PA-C  HYDROcodone-acetaminophen (NORCO) 7.5-325 MG tablet Take 1 tablet by mouth 3 (three) times daily.    Historical Provider, MD  ketoconazole (NIZORAL) 2 % cream Apply 1 application topically 2 (two) times daily as needed for irritation.    Historical Provider, MD  Memantine HCl ER (NAMENDA XR) 28 MG CP24 Take 1 capsule by mouth every morning.     Historical Provider, MD  nabumetone (RELAFEN) 750 MG tablet Take 750 mg by mouth 2 (two) times daily.     Historical Provider, MD  oxybutynin (DITROPAN) 5 MG tablet Take 5 mg by mouth 2 (two) times daily.     Historical Provider, MD  pantoprazole (PROTONIX) 40 MG tablet Take 40 mg by mouth 2 (two) times daily.     Historical Provider, MD  predniSONE (DELTASONE) 20 MG tablet  Take 40 mg by mouth daily for 3 days, then 20mg  by mouth daily for 3 days, then 10mg  daily for 3 days 11/29/16   Larene Pickett, PA-C  promethazine (PHENERGAN) 25 MG tablet Take 25 mg by mouth every 6 (six) hours as needed for nausea or vomiting.    Historical Provider, MD  Propylene Glycol (SYSTANE BALANCE) 0.6 % SOLN Place 1 drop into both eyes 2 (two) times daily.    Historical Provider, MD  QUEtiapine (SEROQUEL) 25 MG tablet Take 25 mg by mouth at bedtime.    Historical Provider, MD  simvastatin (ZOCOR) 10 MG tablet Take 10 mg by mouth at bedtime.    Historical Provider, MD    Family History Family History  Problem Relation Age of Onset  . Asthma Mother   . Heart attack Father     Social History Social History  Substance Use Topics  . Smoking status: Former Smoker    Packs/day: 0.25    Types: Cigarettes    Quit date: 03/13/2012  . Smokeless tobacco: Never Used  . Alcohol use No     Allergies   Sulfa antibiotics   Review of Systems Review of Systems  Unable to perform ROS: Dementia  Respiratory: Positive for shortness of breath.   Cardiovascular: Positive for chest pain.  Neurological: Positive for weakness.       Generalized weakness     Physical Exam Updated Vital Signs BP 122/56 (BP Location: Left Arm)   Pulse 104   Temp 98.3 F (36.8 C) (Oral)   Resp 10   SpO2 98%   Physical Exam  Constitutional:  Chronically ill-appearing  HENT:  Head: Normocephalic and atraumatic.  Eyes: Conjunctivae are normal. Pupils are equal, round, and reactive to light.  Neck: Neck supple. No tracheal deviation present. No thyromegaly present.  Cardiovascular: Regular rhythm.   No murmur heard. Mildly tachycardic  Pulmonary/Chest: Effort normal and breath sounds normal.  Abdominal: Soft. Bowel sounds are normal. She exhibits no distension. There is no tenderness.  Obese  Musculoskeletal: Normal range of motion. She exhibits edema. She exhibits no tenderness.  3+ pretibial  pitting edema bilaterally left lower extremity with reddened area is slightly warm approximately 10 cm x 10 cm at lateral aspect of calf  Neurological: She is alert. Coordination normal.  Skin: Skin is warm and dry. No rash noted.  Psychiatric: She has a normal mood and affect.  Nursing note and vitals reviewed.    ED Treatments / Results  Labs (all labs ordered are listed, but only abnormal results are displayed) Labs Reviewed  CBC WITH DIFFERENTIAL/PLATELET  BRAIN NATRIURETIC PEPTIDE  BASIC METABOLIC PANEL  D-DIMER, QUANTITATIVE (NOT AT [facility])  URINALYSIS, ROUTINE W REFLEX MICROSCOPIC  I-STAT TROPOININ, ED   Chest x-ray viewed by me EKG  EKG Interpretation  Date/Time:  Monday February 05 2017 20:16:59 EST Ventricular Rate:  105 PR Interval:    QRS Duration: 94 QT Interval:  323 QTC Calculation: 427 R Axis:   -19 Text Interpretation:  Sinus tachycardia Inferior infarct, old Baseline wander in lead(s) V1 V2 SINCE LAST TRACING HEART RATE HAS INCREASED Confirmed by Winfred Leeds  MD, Ardyn Forge [phone]) on 02/05/2017 8:51:22 PM       Radiology No results found.  Procedures Procedures (including critical care time)  Medications Ordered in ED Medications - No data to display  Results for orders placed or performed during the hospital encounter of 02/05/17  CBC with Differential/Platelet  Result Value Ref Range   WBC 19.8 (H) 4.0 - 10.5 K/uL   RBC 3.31 (L) 3.87 - 5.11 MIL/uL   Hemoglobin 9.1 (L) 12.0 - 15.0 g/dL   HCT 28.9 (L) 36.0 - 46.0 %   MCV 87.3 78.0 - 100.0 fL   MCH 27.5 26.0 - 34.0 pg   MCHC 31.5 30.0 - 36.0 g/dL   RDW 17.3 (H) 11.5 - 15.5 %   Platelets 430 (H) 150 - 400 K/uL   Neutrophils Relative % 84 %   Neutro Abs 16.8 (H) 1.7 - 7.7 K/uL   Lymphocytes Relative 9 %   Lymphs Abs 1.7 0.7 - 4.0 K/uL   Monocytes Relative 4 %   Monocytes Absolute 0.8 0.1 - 1.0 K/uL   Eosinophils Relative 3 %   Eosinophils Absolute 0.5 0.0 - 0.7 K/uL   Basophils Relative 0 %    Basophils Absolute 0.0 0.0 - 0.1 K/uL  Brain natriuretic peptide  Result Value Ref Range   B Natriuretic Peptide 54.6 0.0 - 100.0 pg/mL  Urinalysis, Routine w reflex microscopic  Result Value Ref Range   Color, Urine YELLOW YELLOW   APPearance CLEAR CLEAR   Specific Gravity, Urine 1.016 1.005 - 1.030   pH 5.0 5.0 - 8.0   Glucose, UA NEGATIVE NEGATIVE mg/dL   Hgb urine dipstick NEGATIVE NEGATIVE   Bilirubin Urine NEGATIVE NEGATIVE   Ketones, ur NEGATIVE NEGATIVE mg/dL   Protein, ur 30 (A) NEGATIVE mg/dL   Nitrite NEGATIVE NEGATIVE   Leukocytes, UA LARGE (A) NEGATIVE   RBC / HPF 0-5 0 - 5 RBC/hpf   WBC, UA 6-30 0 - 5 WBC/hpf   Bacteria, UA RARE (A) NONE SEEN   Squamous Epithelial / LPF 6-30 (A) NONE SEEN   Mucous PRESENT    Hyaline Casts, UA PRESENT    Non Squamous Epithelial 0-5 (A) NONE SEEN  Basic metabolic panel  Result Value Ref Range   Sodium 136 135 - 145 mmol/L   Potassium 4.9 3.5 - 5.1 mmol/L   Chloride 107 101 - 111 mmol/L   CO2 19 (L) 22 - 32 mmol/L   Glucose, Bld 136 (H) 65 - 99 mg/dL   BUN 68 (H) 6 - 20 mg/dL   Creatinine, Ser 3.28 (H) 0.44 - 1.00 mg/dL   Calcium 10.9 (H) 8.9 - 10.3 mg/dL   GFR calc non Af Amer 13 (L) >60 mL/min   GFR calc Af Amer 15 (L) >60 mL/min   Anion gap 10 5 - 15  D-dimer, quantitative (not at [facility])  Result Value Ref Range   D-Dimer, Quant 3.98 (H) 0.00 - 0.50 ug/mL-FEU  I-stat troponin,  ED  Result Value Ref Range   Troponin i, poc 0.00 0.00 - 0.08 ng/mL   Comment 3          I-Stat CG4 Lactic Acid, ED  (not at  [facility])  Result Value Ref Range   Lactic Acid, Venous 1.24 0.5 - 1.9 mmol/L   Dg Chest 2 View  Result Date: 02/05/2017 CLINICAL DATA:  Shortness breath and nausea for 1 month. History of bronchitis, hypertension, heart murmur. Former smoker. EXAM: CHEST  2 VIEW COMPARISON:  02/22/2016 FINDINGS: Shallow inspiration with mild linear atelectasis in the lung bases. Cardiac enlargement without vascular congestion. Focal increased  density in the left lung base posteriorly suggesting focal pneumonia. Tortuous and dilated aorta. No blunting of costophrenic angles. No pneumothorax. IMPRESSION: Focal increased density in the left lung base posteriorly suggests focal pneumonia. Shallow inspiration. Dilated and tortuous aorta. Electronically Signed   By: Lucienne Capers M.D.   On: 02/05/2017 21:08   Initial Impression / Assessment and Plan / ED Course  I have reviewed the triage vital signs and the nursing notes.  Pertinent labs & imaging results that were available during my care of the patient were reviewed by me and considered in my medical decision making (see chart for details).     Patient with acute kidney injury. IV hydration ordered. Elevated d-dimer. She's not candidate for CT angiogram due to poor renal function. Suggest VQ scan tomorrow. I've consulted Dr. Loleta Books who will make arrangements for overnight stay. Anemia is chronic  Final Clinical Impressions(s) / ED Diagnoses  Diagnosis #1 chest pain #2 dyspnea on exertion #3 acute kidney injury #4 anemia #5 leukocytosis #6 peripheral edema Final diagnoses:  None    New Prescriptions New Prescriptions   No medications on file     Orlie Dakin, MD 02/05/17 2342

## 2017-02-05 NOTE — ED Triage Notes (Signed)
Patient c/o weakness for about month. Patient having swelling in lower legs that has been year, patient states that her leg busted and and had sores-pt given antibiotics and fluid pills.

## 2017-02-06 ENCOUNTER — Encounter (HOSPITAL_COMMUNITY): Payer: Self-pay | Admitting: Family Medicine

## 2017-02-06 ENCOUNTER — Inpatient Hospital Stay (HOSPITAL_COMMUNITY): Payer: Medicare Other

## 2017-02-06 DIAGNOSIS — F0281 Dementia in other diseases classified elsewhere with behavioral disturbance: Secondary | ICD-10-CM

## 2017-02-06 DIAGNOSIS — G301 Alzheimer's disease with late onset: Secondary | ICD-10-CM

## 2017-02-06 DIAGNOSIS — F02818 Dementia in other diseases classified elsewhere, unspecified severity, with other behavioral disturbance: Secondary | ICD-10-CM

## 2017-02-06 DIAGNOSIS — D72829 Elevated white blood cell count, unspecified: Secondary | ICD-10-CM

## 2017-02-06 DIAGNOSIS — M7989 Other specified soft tissue disorders: Secondary | ICD-10-CM | POA: Diagnosis present

## 2017-02-06 DIAGNOSIS — D649 Anemia, unspecified: Secondary | ICD-10-CM | POA: Diagnosis present

## 2017-02-06 HISTORY — DX: Dementia in other diseases classified elsewhere with behavioral disturbance: F02.81

## 2017-02-06 HISTORY — DX: Dementia in other diseases classified elsewhere, unspecified severity, with other behavioral disturbance: F02.818

## 2017-02-06 LAB — CBC
HCT: 25.9 % — ABNORMAL LOW (ref 36.0–46.0)
HEMOGLOBIN: 8 g/dL — AB (ref 12.0–15.0)
MCH: 26.3 pg (ref 26.0–34.0)
MCHC: 30.9 g/dL (ref 30.0–36.0)
MCV: 85.2 fL (ref 78.0–100.0)
Platelets: 357 10*3/uL (ref 150–400)
RBC: 3.04 MIL/uL — ABNORMAL LOW (ref 3.87–5.11)
RDW: 17.1 % — AB (ref 11.5–15.5)
WBC: 17.2 10*3/uL — ABNORMAL HIGH (ref 4.0–10.5)

## 2017-02-06 LAB — BASIC METABOLIC PANEL
ANION GAP: 8 (ref 5–15)
BUN: 60 mg/dL — AB (ref 6–20)
CALCIUM: 10.4 mg/dL — AB (ref 8.9–10.3)
CO2: 21 mmol/L — AB (ref 22–32)
CREATININE: 2.77 mg/dL — AB (ref 0.44–1.00)
Chloride: 110 mmol/L (ref 101–111)
GFR calc Af Amer: 18 mL/min — ABNORMAL LOW (ref >60)
GFR, EST NON AFRICAN AMERICAN: 16 mL/min — AB (ref >60)
GLUCOSE: 132 mg/dL — AB (ref 65–99)
Potassium: 5 mmol/L (ref 3.5–5.1)
Sodium: 139 mmol/L (ref 135–145)

## 2017-02-06 LAB — SODIUM, URINE, RANDOM: Sodium, Ur: 58 mmol/L

## 2017-02-06 LAB — CREATININE, URINE, RANDOM: Creatinine, Urine: 130.29 mg/dL

## 2017-02-06 LAB — INFLUENZA PANEL BY PCR (TYPE A & B)
INFLAPCR: NEGATIVE
INFLBPCR: NEGATIVE

## 2017-02-06 MED ORDER — MEMANTINE HCL ER 28 MG PO CP24
28.0000 mg | ORAL_CAPSULE | Freq: Every day | ORAL | Status: DC
Start: 2017-02-06 — End: 2017-02-09
  Administered 2017-02-06 – 2017-02-09 (×4): 28 mg via ORAL
  Filled 2017-02-06 (×4): qty 1

## 2017-02-06 MED ORDER — ACETAMINOPHEN 325 MG PO TABS
650.0000 mg | ORAL_TABLET | Freq: Four times a day (QID) | ORAL | Status: DC | PRN
  Administered 2017-02-06 – 2017-02-09 (×4): 650 mg via ORAL
  Filled 2017-02-06 (×4): qty 2

## 2017-02-06 MED ORDER — DEXTROSE 5 % IV SOLN
500.0000 mg | INTRAVENOUS | Status: AC
  Administered 2017-02-06 – 2017-02-08 (×3): 500 mg via INTRAVENOUS
  Filled 2017-02-06 (×3): qty 500

## 2017-02-06 MED ORDER — SIMVASTATIN 10 MG PO TABS
10.0000 mg | ORAL_TABLET | Freq: Every day | ORAL | Status: DC
Start: 2017-02-06 — End: 2017-02-09
  Administered 2017-02-06 – 2017-02-08 (×4): 10 mg via ORAL
  Filled 2017-02-06 (×4): qty 1

## 2017-02-06 MED ORDER — PANTOPRAZOLE SODIUM 40 MG PO TBEC
40.0000 mg | DELAYED_RELEASE_TABLET | Freq: Every day | ORAL | Status: DC
  Administered 2017-02-06 – 2017-02-09 (×4): 40 mg via ORAL
  Filled 2017-02-06 (×4): qty 1

## 2017-02-06 MED ORDER — ACETAMINOPHEN 650 MG RE SUPP: 650.0000 mg | Freq: Four times a day (QID) | RECTAL | Status: DC | PRN

## 2017-02-06 MED ORDER — SODIUM CHLORIDE 0.9 % IV BOLUS (SEPSIS)
500.0000 mL | Freq: Once | INTRAVENOUS | Status: AC
  Administered 2017-02-06: 500 mL via INTRAVENOUS

## 2017-02-06 MED ORDER — DONEPEZIL HCL 10 MG PO TABS
10.0000 mg | ORAL_TABLET | Freq: Every day | ORAL | Status: DC
  Administered 2017-02-06 – 2017-02-08 (×4): 10 mg via ORAL
  Filled 2017-02-06 (×4): qty 1

## 2017-02-06 MED ORDER — QUETIAPINE FUMARATE 25 MG PO TABS
25.0000 mg | ORAL_TABLET | Freq: Every day | ORAL | Status: DC
  Administered 2017-02-06 – 2017-02-08 (×4): 25 mg via ORAL
  Filled 2017-02-06 (×4): qty 1

## 2017-02-06 MED ORDER — ASPIRIN EC 81 MG PO TBEC
81.0000 mg | DELAYED_RELEASE_TABLET | Freq: Every day | ORAL | Status: DC
  Administered 2017-02-06 – 2017-02-09 (×4): 81 mg via ORAL
  Filled 2017-02-06 (×4): qty 1

## 2017-02-06 MED ORDER — HEPARIN SODIUM (PORCINE) 5000 UNIT/ML IJ SOLN
5000.0000 [IU] | Freq: Three times a day (TID) | INTRAMUSCULAR | Status: DC
  Administered 2017-02-06 – 2017-02-09 (×11): 5000 [IU] via SUBCUTANEOUS
  Filled 2017-02-06 (×11): qty 1

## 2017-02-06 MED ORDER — CLORAZEPATE DIPOTASSIUM 7.5 MG PO TABS
7.5000 mg | ORAL_TABLET | Freq: Three times a day (TID) | ORAL | Status: DC
Start: 2017-02-06 — End: 2017-02-09
  Administered 2017-02-06 – 2017-02-09 (×11): 7.5 mg via ORAL
  Filled 2017-02-06 (×11): qty 1

## 2017-02-06 MED ORDER — HYDROCODONE-ACETAMINOPHEN 5-325 MG PO TABS
1.0000 | ORAL_TABLET | Freq: Three times a day (TID) | ORAL | Status: DC
Start: 2017-02-06 — End: 2017-02-09
  Administered 2017-02-06 – 2017-02-09 (×11): 1 via ORAL
  Filled 2017-02-06 (×11): qty 1

## 2017-02-06 MED ORDER — ACETAMINOPHEN 500 MG PO TABS: 500.0000 mg | ORAL_TABLET | Freq: Three times a day (TID) | ORAL | Status: DC | PRN

## 2017-02-06 MED ORDER — SODIUM CHLORIDE 0.9 % IV SOLN
INTRAVENOUS | Status: DC
  Administered 2017-02-06 – 2017-02-09 (×5): via INTRAVENOUS

## 2017-02-06 MED ORDER — CEFTRIAXONE SODIUM 1 G IJ SOLR
1.0000 g | Freq: Every day | INTRAMUSCULAR | Status: DC
  Administered 2017-02-06 – 2017-02-07 (×3): 1 g via INTRAVENOUS
  Filled 2017-02-06 (×5): qty 10
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
## 2017-02-06 NOTE — Progress Notes (Signed)
*  PRELIMINARY RESULTS* Vascular Ultrasound Left lower extremity venous duplex has been completed.  Preliminary findings: no evidence of DVT in visualized veins or baker's cyst.    Landry Mellow, RDMS, RVT  02/06/2017, 10:02 AM

## 2017-02-06 NOTE — Progress Notes (Signed)
PROGRESS NOTE  Katherine Cooper  [id number] DOB: [birth year] DOA: 02/05/2017 PCP: Philis Fendt, MD  Brief Narrative:   Katherine Cooper is a 76 y.o. female with a past medical history significant for moderate dementia, CKD IV baseline Cr 1.5, and HTN who presented with leg swelling.  Patient has dementia and is an unreliable historian.  She has had leg swelling for many months which became acutely worse recently.  She saw her PCP who prescribed antibiotic pills, but the area ulcerated so her family brought her to the ER.  She also endorsed SOB.  In the ER, she was septic with tachycardia, leukocytosis.  CXR demonstrated a possible left base pneumonia.  UA suggested possible UTI.  She had AKI.  She was started on IVF and antibiotics. Duplex of the left lower extremity was negative for DVT.    Assessment & Plan:   Principal Problem:   Acute renal failure (ARF) (HCC) Active Problems:   Essential hypertension   CKD (chronic kidney disease) stage 4, GFR 15-29 ml/min (HCC)   Alzheimer's dementia, late onset, with behavioral disturbance   Leukocytosis   Leg swelling   Normocytic anemia  Acute kidney injury on CKD IV, Baseline Cr 1.5-1.6 and 3.2 mg/dl on admission.  Take BID naprosyn, trending down with IVF -  Renal US:  Medical renal disease - minimize nephrotoxins -  Renally dose medications  Sepsis due to possible CAP, left lower extremity cellulitis or UTI -  Sepsis physiology (tachycardia, leukocytosis) only marginally improved since admission -  Continue IVF -  Continue ceftriaxone -  Add azithromycin for atypical coverage   Cellulitis and leg swelling:  likely worse on the left due to cellulitis, but underlying problem is likely venous insufficiency given the lateral/posterior location her ulcer.  Alternatively, perhaps this is gout flare.  The degree of tenderness suggests gout.   -  Duplex negative -  Appreciate wound care assistance -  Unna boots -  Continue ceftriaxone   -  ECHO to eval for heart failure  Possible UTI -Follow urine culture, blood cultures -Ceftriaxone IV  Normocytic anemia, hemoglobin trending down -  Iron studies, B12, folate -  TSH -  Occult stool -  Repeat hgb in AM  5. Hypertension:  Continue to hold HCTZ, triamterene, Coreg -Continue statin, aspirin  6. Dementia:  -Continue memantine, Aricept, BZD, Seroquel  7. Other medications:  -Continue Norco PRN -Hold nonessential oxybutynin and cetirizine  DVT prophylaxis:  Continue heparin  Code Status:  full Family Communication:  Patient and her son who was at bedside Disposition Plan:  Patient would like to return home but is confused and has not been able to manage without assistance at home.  Son would like to investigate ALF.  SW consult placed   Consultants:   none  Procedures:  none  Antimicrobials:  Anti-infectives    Start     Dose/Rate Route Frequency Ordered Stop   02/06/17 1200  azithromycin (ZITHROMAX) 500 mg in dextrose 5 % 250 mL IVPB     500 mg 250 mL/hr over 60 Minutes Intravenous Every 24 hours 02/06/17 1120 02/09/17 1159   02/06/17 0315  cefTRIAXone (ROCEPHIN) 1 g in dextrose 5 % 50 mL IVPB     1 g 100 mL/hr over 30 Minutes Intravenous Daily at bedtime 02/06/17 0122         Subjective: Continues to feel a little SOB.  Denies nausea, vomiting, diarrhea, dysuria.  Still has severe pain in the left  lower extremity with swelling  Objective: Vitals:   02/05/17 2201 02/05/17 2334 02/06/17 0537 02/06/17 1351  BP:  95/75 118/70 114/73  Pulse:  109 (!) 112 95  Resp:  18 18 18   Temp: 100 F (37.8 C)  99.7 F (37.6 C) 99.5 F (37.5 C)  TempSrc: Rectal  Oral Oral  SpO2:  98% 100% 97%    Intake/Output Summary (Last 24 hours) at 02/06/17 1740 Last data filed at 02/06/17 1000  Gross per 24 hour  Intake              640 ml  Output                2 ml  Net              638 ml   There were no vitals filed for this  visit.  Examination:  General exam:  Adult female.  No acute distress.  HEENT:  NCAT, MMM Respiratory system:  Left anterior chest with rales, no rhonchi or wheeze,  Cardiovascular system: Regular rate and rhythm, 1/6 systolic murmur. no gallops or clicks.  Feels very warm to touch/febrile Gastrointestinal system: Normal active bowel sounds, soft, nondistended, nontender. MSK:  Normal tone and bulk, 1+ right lower extremity and 2+ pitting left lower extremity edema with some pinkness around the distal left ankle circumferentially without distinct line of demarcation.  Ulcerated area on lateral/posterior left ankle with granulation tissue and draining cloudy fluid Neuro:  Grossly moves all extremities    Data Reviewed: I have personally reviewed following labs and imaging studies  CBC:  Recent Labs Lab 02/05/17 2055 02/06/17 0542  WBC 19.8* 17.2*  NEUTROABS 16.8*  --   HGB 9.1* 8.0*  HCT 28.9* 25.9*  MCV 87.3 85.2  PLT 430* [id number]   Basic Metabolic Panel:  Recent Labs Lab 02/05/17 2135 02/06/17 0542  NA 136 139  K 4.9 5.0  CL 107 110  CO2 19* 21*  GLUCOSE 136* 132*  BUN 68* 60*  CREATININE 3.28* 2.77*  CALCIUM 10.9* 10.4*   GFR: CrCl cannot be calculated (Unknown ideal weight.). Liver Function Tests: No results for input(s): AST, ALT, ALKPHOS, BILITOT, PROT, ALBUMIN in the last 168 hours. No results for input(s): LIPASE, AMYLASE in the last 168 hours. No results for input(s): AMMONIA in the last 168 hours. Coagulation Profile: No results for input(s): INR, PROTIME in the last 168 hours. Cardiac Enzymes: No results for input(s): CKTOTAL, CKMB, CKMBINDEX, TROPONINI in the last 168 hours. BNP (last 3 results) No results for input(s): PROBNP in the last 8760 hours. HbA1C: No results for input(s): HGBA1C in the last 72 hours. CBG: No results for input(s): GLUCAP in the last 168 hours. Lipid Profile: No results for input(s): CHOL, HDL, LDLCALC, TRIG, CHOLHDL,  LDLDIRECT in the last 72 hours. Thyroid Function Tests: No results for input(s): TSH, T4TOTAL, FREET4, T3FREE, THYROIDAB in the last 72 hours. Anemia Panel: No results for input(s): VITAMINB12, FOLATE, FERRITIN, TIBC, IRON, RETICCTPCT in the last 72 hours. Urine analysis:    Component Value Date/Time   COLORURINE YELLOW 02/05/2017 2040   APPEARANCEUR CLEAR 02/05/2017 2040   LABSPEC 1.016 02/05/2017 2040   PHURINE 5.0 02/05/2017 2040   GLUCOSEU NEGATIVE 02/05/2017 2040   HGBUR NEGATIVE 02/05/2017 2040   BILIRUBINUR NEGATIVE 02/05/2017 2040   KETONESUR NEGATIVE 02/05/2017 2040   PROTEINUR 30 (A) 02/05/2017 2040   UROBILINOGEN 0.2 04/29/2015 0628   NITRITE NEGATIVE 02/05/2017 2040   LEUKOCYTESUR LARGE (A) 02/05/2017 2040  Sepsis Labs: @LABRCNTIP (procalcitonin:4,lacticidven:4)  )No results found for this or any previous visit (from the past 240 hour(s)).    Radiology Studies: Dg Chest 2 View  Result Date: 02/05/2017 CLINICAL DATA:  Shortness breath and nausea for 1 month. History of bronchitis, hypertension, heart murmur. Former smoker. EXAM: CHEST  2 VIEW COMPARISON:  02/22/2016 FINDINGS: Shallow inspiration with mild linear atelectasis in the lung bases. Cardiac enlargement without vascular congestion. Focal increased density in the left lung base posteriorly suggesting focal pneumonia. Tortuous and dilated aorta. No blunting of costophrenic angles. No pneumothorax. IMPRESSION: Focal increased density in the left lung base posteriorly suggests focal pneumonia. Shallow inspiration. Dilated and tortuous aorta. Electronically Signed   By: Lucienne Capers M.D.   On: 02/05/2017 21:08   US Renal  Result Date: 02/06/2017 CLINICAL DATA:  Increased BUN creatinine, chronic kidney disease, hypertension EXAM: RENAL / URINARY TRACT ULTRASOUND COMPLETE COMPARISON:  11/13/2011 FINDINGS: Right Kidney: Length: 11 cm. Echogenicity within normal limits. No mass or hydronephrosis visualized. Left  Kidney: Length: 10.5 cm. Echogenicity within normal limits. No mass or hydronephrosis visualized. Mild bilateral renal cortical thinning probable due to atrophy. Bladder: Appears normal for degree of bladder distention. IMPRESSION: No hydronephrosis or renal calculi. Mild bilateral renal cortical thinning probable due to mild atrophy. Unremarkable urinary bladder. Electronically Signed   By: Lahoma Crocker M.D.   On: 02/06/2017 10:00     Scheduled Meds: . aspirin EC  81 mg Oral Daily  . azithromycin  500 mg Intravenous Q24H  . cefTRIAXone (ROCEPHIN)  IV  1 g Intravenous QHS  . clorazepate  7.5 mg Oral TID  . donepezil  10 mg Oral QHS  . heparin  5,000 Units Subcutaneous Q8H  . HYDROcodone-acetaminophen  1 tablet Oral Q8H  . memantine  28 mg Oral Daily  . pantoprazole  40 mg Oral Daily  . QUEtiapine  25 mg Oral QHS  . simvastatin  10 mg Oral QHS   Continuous Infusions: . sodium chloride 100 mL/hr at 02/06/17 0637     LOS: 1 day    Time spent: 30 min    Janece Canterbury, MD [facility] Pager [phone]  If 7PM-7AM, please contact night-coverage [email or web address] Password [facility] 02/06/2017, 5:40 PM

## 2017-02-06 NOTE — Progress Notes (Signed)
RN received report from ED. Pt arrived unit, alert and oriented. MD notified of Pt's location. Will continue with current plan of care.

## 2017-02-06 NOTE — Consult Note (Signed)
[place] Nurse wound consult note Reason for Consult: LLE with full thickness and scattered partial thickness ulcers secondary to fluid overload. NOw with cellulitis.  Wound type:Infectious, venous insufficiency Pressure Injury POA:No Measurement:  Left lateral LE wound measures 4cm x 4.5cm x 0.1cm with irregular wound edges and red, moist base.  Small amount of exudate (serous noted). Patient with dried toilet tissue adhering to wound bed. Atraumatic removal. There are several partial thickness wounds scattered at posterior, medial and anterior LE. Deep grooves in the anterior foot indicate degree of edema that has resolved since admission and LE elevation.  Son in room states that legs and feet have gone down remarkably since admission. Wound bed:  As described above Drainage (amount, consistency, odor) Scant serous now Periwound:Macerated, erythematous, warm on left.  Dry on right. Mild edema bilaterally. Hemosiderin staining. Dressing procedure/placement/frequency: I will provide Nursing with conservative care orders for the bilateral LEs while she is here so that we may continue to observe for resolution of erythema and edema.  Once discharged back to facility, would recommend continuing care with compression, such as with an [employer], with twice weekly initially and then once weekly changes. LE elevation and flotation of heels to avoid pressure injuries can also be included as we have implemented both here. If you agree, please include recommendation in your discharge summary. [place] nursing team will not follow, but will remain available to this patient, the nursing and medical teams.  Please re-consult if needed. Thanks, Maudie Flakes, MSN, RN, [place], Arther Abbott  Pager# [phone]

## 2017-02-06 NOTE — H&P (Signed)
History and Physical  Patient Name: Katherine Cooper     [id number]    DOB: 01-Dec-1941    DOA: 02/05/2017 PCP: Philis Fendt, MD   Patient coming from: Home  Chief Complaint: Leg swelling  HPI: Katherine Cooper is a 76 y.o. 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  Caveat that the patient has dementia of unclear degree and is possibly an unreliable historian.  EDP talked with family who stated that the patient had had leg swelling for many months, but that it had gotten acutely worse recently, she had seen a doctor who prescribed antibiotic pills, but now the spot had busted open and so they brought her to the ER. EDP also elicited from the patient that she had had some dyspnea with exertion recently and chest discomfort of some sort.  To me, the patient states that she came in because her left leg had gotten very swollen, the leg was painful and started to weep in the last day or so.  She denies chest pain, trouble breathing, feeling short of breath, orthopnea, cough, sputum production, fever, chills, nausea, malaise, urinary irritative symptoms, dysuria, foul-urine, joint pain, joint swelling.  ED course: -Temp 100F, heart rate 105, respirations and pulse ox normal, BP 121/106 -Na 136, K 4.9, Cr 3.28 (baseline 1.5), WBC 19.8K, Hgb 9.1 -UA showed leukocytes, hyaline casts, no RBCs -BNP normal, troponin normal -Dimer elevated -CXR showed questionable L base opacity -ECG showed sinus tachycardia -She was given 1L NS for AKI and TRH were asked to evaluate -Lactic acid was obtained and was normal, ceftriaxone was administered empirically     ROS: Review of Systems  Constitutional: Negative for chills, fever and malaise/fatigue.  Respiratory: Negative for cough, sputum production, shortness of breath and wheezing.   Cardiovascular: Positive for leg swelling. Negative for chest pain,  palpitations and orthopnea.  Gastrointestinal: Negative for abdominal pain, blood in stool, diarrhea, melena, nausea and vomiting.  Genitourinary: Negative for dysuria, frequency and urgency.  Musculoskeletal: Negative for myalgias.  Neurological: Positive for weakness.  All other systems reviewed and are negative.         Past Medical History:  Diagnosis Date  . Alzheimer disease   . Alzheimer's dementia, late onset, with behavioral disturbance 02/06/2017  . Anemia   . Angina   . Anxiety   . Arthritis   . Blood transfusion   . Breast cancer ([employer]) 10/22/2012  . CAP (community acquired pneumonia)   . Chronic kidney disease   . DDD (degenerative disc disease), lumbar   . Dementia   . Dysrhythmia   . Gout   . Headache(784.0)   . Heart murmur   . Hypertension   . Hypothyroidism   . Obesity   . Recurrent upper respiratory infection (URI)   . Shortness of breath 05/26/2013  . Stroke [facility])     Past Surgical History:  Procedure Laterality Date  . UTERINE FIBROID SURGERY      Social History: Patient lives at home.  The patient walks with a 4WW.  She was a Corporate investment banker and caretaker.  She is a former smoker.    Allergies  Allergen Reactions  . Sulfa Antibiotics Itching    Family history: family history includes Asthma in her mother; Heart attack in her father.  Prior to Admission medications   Medication Sig Start Date End Date Taking? Authorizing Provider  acetaminophen (TYLENOL) 500 MG  tablet Take 500 mg by mouth every 8 (eight) hours as needed for mild pain.   Yes Historical Provider, MD  aspirin EC 81 MG tablet Take 81 mg by mouth daily.   Yes Historical Provider, MD  carvedilol (COREG) 3.125 MG tablet Take 3.125 mg by mouth 2 (two) times daily with a meal.   Yes Historical Provider, MD  cetirizine (ZYRTEC) 10 MG tablet Take 10 mg by mouth daily.    Yes Historical Provider, MD  clorazepate (TRANXENE) 7.5 MG tablet Take 7.5 mg by mouth 3 (three) times daily.    Yes  Historical Provider, MD  donepezil (ARICEPT) 10 MG tablet Take 10 mg by mouth at bedtime.    Yes Historical Provider, MD  fluticasone (FLONASE) 50 MCG/ACT nasal spray Place 2 sprays into both nostrils daily.    Yes Historical Provider, MD  furosemide (LASIX) 40 MG tablet Take 40 mg by mouth daily.   Yes Historical Provider, MD  HYDROcodone-acetaminophen (NORCO/VICODIN) 5-325 MG tablet Take 1 tablet by mouth every 8 (eight) hours.   Yes Historical Provider, MD  Memantine HCl ER (NAMENDA XR) 28 MG CP24 Take 1 capsule by mouth every morning.    Yes Historical Provider, MD  Multiple Vitamin (MULTIVITAMIN WITH MINERALS) TABS tablet Take 1 tablet by mouth daily.   Yes Historical Provider, MD  oxybutynin (DITROPAN-XL) 5 MG 24 hr tablet Take 5 mg by mouth daily. 02/02/17  Yes Historical Provider, MD  pantoprazole (PROTONIX) 40 MG tablet Take 40 mg by mouth daily.    Yes Historical Provider, MD  QUEtiapine (SEROQUEL) 25 MG tablet Take 25 mg by mouth at bedtime.   Yes Historical Provider, MD  simvastatin (ZOCOR) 10 MG tablet Take 10 mg by mouth at bedtime.   Yes Historical Provider, MD  triamterene-hydrochlorothiazide (MAXZIDE-25) 37.5-25 MG tablet Take 1 tablet by mouth daily.   Yes Historical Provider, MD       Physical Exam: BP 95/75 (BP Location: Right Arm)   Pulse 109   Temp 100 F (37.8 C) (Rectal)   Resp 18   SpO2 98%  General appearance: Well-developed, elderly adult female, alert and in no acute distress, seems pleasantly confused, but oriented to place, month, situation.   Eyes: Anicteric, conjunctiva pink, lids and lashes normal. PERRL.    ENT: No nasal deformity, discharge, epistaxis.  Hearing normal. OP moist without lesions.   Neck: No neck masses.  Trachea midline.  No thyromegaly/tenderness. Lymph: No cervical or supraclavicular lymphadenopathy. Skin: Warm and dry.  No jaundice.  No suspicious rashes or lesions. Cardiac: RRR, nl S1-S2, no murmurs appreciated.  Capillary refill is  brisk.  JVP normal.  Marked pitting 3+ in left leg, 1+ in right.Radial pulses 2+ and symmetric. Respiratory: Normal respiratory rate and rhythm.  CTAB without rales or wheezes. Abdomen: Abdomen soft.  No TTP. No ascites, distension, hepatosplenomegaly.   MSK: No deformities or effusions.  No cyanosis or clubbing. Neuro: Cranial nerves grossly symmetric.  Sensation intact to light touch. Speech is fluent.  Muscle strength globally weak.    Psych: Sensorium intact and responding to questions, memory seems poor, but A&Ox4, attention normal.  Affect pleasant.      Labs on Admission:  I have personally reviewed following labs and imaging studies: CBC:  Recent Labs Lab 02/05/17 2055  WBC 19.8*  NEUTROABS 16.8*  HGB 9.1*  HCT 28.9*  MCV 87.3  PLT [id number]*   Basic Metabolic Panel:  Recent Labs Lab 02/05/17 2135  NA 136  K 4.9  CL 107  CO2 19*  GLUCOSE 136*  BUN 68*  CREATININE 3.28*  CALCIUM 10.9*   GFR: CrCl cannot be calculated (Unknown ideal weight.).  Liver Function Tests: No results for input(s): AST, ALT, ALKPHOS, BILITOT, PROT, ALBUMIN in the last 168 hours. No results for input(s): LIPASE, AMYLASE in the last 168 hours. No results for input(s): AMMONIA in the last 168 hours. Coagulation Profile: No results for input(s): INR, PROTIME in the last 168 hours. Cardiac Enzymes: No results for input(s): CKTOTAL, CKMB, CKMBINDEX, TROPONINI in the last 168 hours. BNP (last 3 results) No results for input(s): PROBNP in the last 8760 hours. HbA1C: No results for input(s): HGBA1C in the last 72 hours. CBG: No results for input(s): GLUCAP in the last 168 hours. Lipid Profile: No results for input(s): CHOL, HDL, LDLCALC, TRIG, CHOLHDL, LDLDIRECT in the last 72 hours. Thyroid Function Tests: No results for input(s): TSH, T4TOTAL, FREET4, T3FREE, THYROIDAB in the last 72 hours. Anemia Panel: No results for input(s): VITAMINB12, FOLATE, FERRITIN, TIBC, IRON, RETICCTPCT in the  last 72 hours. Sepsis Labs: Invalid input(s): PROCALCITONIN, LACTICIDVEN No results found for this or any previous visit (from the past 240 hour(s)).       Radiological Exams on Admission: Personally reviewed CXR shows possible LL opacity, unclear: Dg Chest 2 View  Result Date: 02/05/2017 CLINICAL DATA:  Shortness breath and nausea for 1 month. History of bronchitis, hypertension, heart murmur. Former smoker. EXAM: CHEST  2 VIEW COMPARISON:  02/22/2016 FINDINGS: Shallow inspiration with mild linear atelectasis in the lung bases. Cardiac enlargement without vascular congestion. Focal increased density in the left lung base posteriorly suggesting focal pneumonia. Tortuous and dilated aorta. No blunting of costophrenic angles. No pneumothorax. IMPRESSION: Focal increased density in the left lung base posteriorly suggests focal pneumonia. Shallow inspiration. Dilated and tortuous aorta. Electronically Signed   By: Lucienne Capers M.D.   On: 02/05/2017 21:08    EKG: Independently reviewed. Rate 105, QTc 427, sinus rhyhtm.        Assessment/Plan  1. Acute kidney injury on CKD IV:  Baseline Cr 1.5-1.6.  Now 3.2.  She is very vague about her medicines with me because of dementia, but she has blister packs that include a recent course of Augmentin and also an NSAID BID, nabumetone.  ?also if a UTI is contributing.   -Fluids -Trend BMP -Check urine electrolytes -Renal US ordered -Hold NSAID! -Hold antihypertensives for now, as BP is soft -Hold diuretic  -Follow urine culture and treat with ceftriaxone for now    2. Leg swelling:  Left leg worse than right.  No chest pain or SOB, overall clinical suspicion for PE low.  -Will obtain US left leg before VQ  3. UTI:  Suspect leukocytosis and low grade Temp are from UTI, perhaps cellulitis (she seems to have been on Augmentin recently for this).   No sepsis. -Follow urine culture, blood cultures -Ceftriaxone IV  4. Anemia:   Stable  5. Hypertension:  -Hold HCTZ, triamterene, Coreg -Continue statin, aspirin  6. Dementia:  -Continue memantine, Aricept, BZD, Seroquel  7. Other medications:  -Continue Norco PRN -Hold nonessential oxybutynin and cetirizine    DVT prophylaxis: heparin  Code Status: FULL  Family Communication: None present, made phone calls to daughters, no answer  Disposition Plan: Anticipate IV fluids, ancillary studies and trend BMP.  Follow urine/blood cultures. Consults called: None Admission status: INPATIENT          Medical decision making: Patient seen at 12:25 AM  on 02/06/2017.  The patient was discussed with Dr. Winfred Leeds.  What exists of the patient's chart was reviewed in depth and summarized above.  Clinical condition: stable from hemodynamic and respiratory standpoint, appears comfortable.        Edwin Dada [facility] Pager [phone]      At the time of admission, it appears that the appropriate admission status for this patient is INPATIENT. This is judged to be reasonable and necessary in order to provide the required intensity of service to ensure the patient's safety given the presenting symptoms, physical exam findings, and initial radiographic and laboratory data in the context of their chronic comorbidities.  Together, these circumstances are felt to place her at high risk for further clinical deterioration threatening life, limb, or organ.   Patient requires inpatient status due to high intensity of service, high risk for further deterioration and high frequency of surveillance required because of this acute illness that poses a threat to life, limb or bodily function.  I certify that at the point of admission it is my clinical judgment that the patient will require inpatient hospital care spanning beyond 2 midnights from the point of admission and that early discharge would result in unnecessary risk of decompensation and readmission or  threat to life, limb or bodily function.
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
## 2017-02-07 ENCOUNTER — Inpatient Hospital Stay (HOSPITAL_COMMUNITY): Payer: Medicare Other

## 2017-02-07 ENCOUNTER — Ambulatory Visit (INDEPENDENT_AMBULATORY_CARE_PROVIDER_SITE_OTHER): Payer: Self-pay | Admitting: Orthopedic Surgery

## 2017-02-07 DIAGNOSIS — G301 Alzheimer's disease with late onset: Secondary | ICD-10-CM

## 2017-02-07 DIAGNOSIS — M7989 Other specified soft tissue disorders: Secondary | ICD-10-CM

## 2017-02-07 DIAGNOSIS — D649 Anemia, unspecified: Secondary | ICD-10-CM

## 2017-02-07 DIAGNOSIS — F0281 Dementia in other diseases classified elsewhere with behavioral disturbance: Secondary | ICD-10-CM

## 2017-02-07 DIAGNOSIS — N184 Chronic kidney disease, stage 4 (severe): Secondary | ICD-10-CM

## 2017-02-07 DIAGNOSIS — D72829 Elevated white blood cell count, unspecified: Secondary | ICD-10-CM

## 2017-02-07 DIAGNOSIS — I10 Essential (primary) hypertension: Secondary | ICD-10-CM

## 2017-02-07 DIAGNOSIS — R600 Localized edema: Secondary | ICD-10-CM

## 2017-02-07 DIAGNOSIS — N179 Acute kidney failure, unspecified: Secondary | ICD-10-CM

## 2017-02-07 DIAGNOSIS — R0600 Dyspnea, unspecified: Secondary | ICD-10-CM

## 2017-02-07 LAB — CBC
HCT: 24.7 % — ABNORMAL LOW (ref 36.0–46.0)
HEMOGLOBIN: 7.6 g/dL — AB (ref 12.0–15.0)
MCH: 27 pg (ref 26.0–34.0)
MCHC: 30.8 g/dL (ref 30.0–36.0)
MCV: 87.9 fL (ref 78.0–100.0)
Platelets: 330 10*3/uL (ref 150–400)
RBC: 2.81 MIL/uL — ABNORMAL LOW (ref 3.87–5.11)
RDW: 16.9 % — ABNORMAL HIGH (ref 11.5–15.5)
WBC: 19.2 10*3/uL — AB (ref 4.0–10.5)

## 2017-02-07 LAB — ECHOCARDIOGRAM COMPLETE
AO mean calculated velocity dopler: 148 cm/s
AV Area VTI: 3.07 cm2
AV Area mean vel: 3.28 cm2
AV Mean grad: 11 mmHg
AV Peak grad: 22 mmHg
AV VEL mean LVOT/AV: 1.16
AV vel: 2.92
AVAREAMEANVIN: 1.58 cm2/m2
AVAREAVTIIND: 1.4 cm2/m2
AVPKVEL: 235 cm/s
Ao pk vel: 1.08 m/s
CHL CUP AV PEAK INDEX: 1.48
CHL CUP RV SYS PRESS: 49 mmHg
EERAT: 17.58
EWDT: 240 ms
FS: 41 % (ref 28–44)
IV/PV OW: 0.94
LA diam end sys: 18 mm
LA vol A4C: 45.2 ml
LA vol index: 20.5 mL/m2
LADIAMINDEX: 0.87 cm/m2
LASIZE: 18 mm
LAVOL: 42.7 mL
LDCA: 2.84 cm2
LV E/e'average: 17.58
LV TDI E'LATERAL: 6.2
LV TDI E'MEDIAL: 4.13
LVEEMED: 17.58
LVELAT: 6.2 cm/s
LVOT SV: 120 mL
LVOT VTI: 42.1 cm
LVOT peak grad rest: 26 mmHg
LVOTD: 19 mm
LVOTPV: 254 cm/s
LVOTVTI: 1.03 cm
MV Dec: 240
MV Peak grad: 5 mmHg
MV pk A vel: 165 m/s
MVPKEVEL: 109 m/s
P 1/2 time: 393 ms
PW: 9.48 mm — AB (ref 0.6–1.1)
RV TAPSE: 27.7 mm
Reg peak vel: 322 cm/s
TR max vel: 322 cm/s
VTI: 41 cm
Valve area index: 1.4
Valve area: 2.92 cm2

## 2017-02-07 LAB — BASIC METABOLIC PANEL
ANION GAP: 7 (ref 5–15)
BUN: 39 mg/dL — ABNORMAL HIGH (ref 6–20)
CHLORIDE: 111 mmol/L (ref 101–111)
CO2: 21 mmol/L — ABNORMAL LOW (ref 22–32)
Calcium: 10.3 mg/dL (ref 8.9–10.3)
Creatinine, Ser: 1.98 mg/dL — ABNORMAL HIGH (ref 0.44–1.00)
GFR calc Af Amer: 27 mL/min — ABNORMAL LOW (ref >60)
GFR, EST NON AFRICAN AMERICAN: 23 mL/min — AB (ref >60)
GLUCOSE: 98 mg/dL (ref 65–99)
Potassium: 4.8 mmol/L (ref 3.5–5.1)
SODIUM: 139 mmol/L (ref 135–145)

## 2017-02-07 LAB — UREA NITROGEN, URINE: UREA NITROGEN UR: 670 mg/dL

## 2017-02-07 LAB — URINE CULTURE: Culture: NO GROWTH

## 2017-02-07 NOTE — NC FL2 (Signed)
[place] LEVEL OF CARE SCREENING TOOL     IDENTIFICATION  Patient Name: Katherine Cooper Birthdate: 04-03-1941 Sex: female Admission Date (Current Location): 02/05/2017  [facility] and [place] Number:  Herbalist and Address:  [facility],  [place], [place]      Provider Number: [id number]  Attending Physician Name and Address:  Kerney Elbe, DO  Relative Name and Phone Number:       Current Level of Care: Hospital Recommended Level of Care: [place] Prior Approval Number:    Date Approved/Denied:   PASRR Number:    Discharge Plan: SNF    Current Diagnoses: Patient Active Problem List   Diagnosis Date Noted  . Alzheimer's dementia, late onset, with behavioral disturbance 02/06/2017  . Leukocytosis 02/06/2017  . Leg swelling 02/06/2017  . Normocytic anemia 02/06/2017  . Acute renal failure (ARF) ([place]) 02/05/2017  . Chest pain 10/12/2013  . Palpitations 10/12/2013  . Acute bronchitis 10/12/2013  . ARF (acute renal failure) ([place]) 10/12/2013  . Shortness of breath 05/26/2013  . Breast cancer ([place]) 10/22/2012  . Dehydration 07/22/2012  . Chronic pain 07/22/2012  . Anemia 07/22/2012  . Hypotension 07/21/2012  . Hip pain 07/21/2012  . Anxiety   . Elbow pain 07/09/2012  . PNA (pneumonia) 07/08/2012  . Hyperkalemia 07/08/2012  . Essential hypertension 07/08/2012  . CKD (chronic kidney disease) stage 4, GFR 15-29 ml/min (HCC) 07/08/2012  . Chest pain at rest 11/12/2011    Class: Acute  . Bilateral leg edema 11/12/2011    Class: Chronic  . Obesity (BMI 30-39.9) 11/12/2011    Class: Chronic  . Gout 11/12/2011    Class: Chronic    Orientation RESPIRATION BLADDER Height & Weight     Self, Place  Normal Incontinent Weight:   Height:     BEHAVIORAL SYMPTOMS/MOOD NEUROLOGICAL BOWEL NUTRITION STATUS      Incontinent  (Heart Healthy)  AMBULATORY STATUS COMMUNICATION OF NEEDS Skin   Limited  Assist Verbally Normal                       Personal Care Assistance Level of Assistance  Bathing, Feeding, Dressing Bathing Assistance: Limited assistance Feeding assistance: Independent (Has to patient up to eat. ) Dressing Assistance: Limited assistance     Functional Limitations Info  Sight, Hearing, Speech Sight Info: Impaired Hearing Info: Adequate Speech Info: Adequate    SPECIAL CARE FACTORS FREQUENCY  PT (By licensed PT), OT (By licensed OT)     PT Frequency: 5 OT Frequency: 5            Contractures Contractures Info: Present    Additional Factors Info  Code Status, Allergies, Psychotropic Code Status Info: Fullcode Allergies Info: Sulfa Antibiotics Psychotropic Info: Seroquel          Current Medications (02/07/2017):  This is the current hospital active medication list Current Facility-Administered Medications  Medication Dose Route Frequency Provider Last Rate Last Dose  . 0.9 %  sodium chloride infusion   Intravenous Continuous Janece Canterbury, MD 75 mL/hr at 02/06/17 1745    . acetaminophen (TYLENOL) tablet 650 mg  650 mg Oral Q6H PRN Edwin Dada, MD   650 mg at 02/06/17 1023   Or  . acetaminophen (TYLENOL) suppository 650 mg  650 mg Rectal Q6H PRN Edwin Dada, MD      . aspirin EC tablet 81 mg  81 mg Oral Daily Christopher P  Danford, MD   81 mg at 02/07/17 1000  . azithromycin (ZITHROMAX) 500 mg in dextrose 5 % 250 mL IVPB  500 mg Intravenous Q24H Janece Canterbury, MD   500 mg at 02/06/17 1315  . cefTRIAXone (ROCEPHIN) 1 g in dextrose 5 % 50 mL IVPB  1 g Intravenous QHS Edwin Dada, MD   1 g at 02/06/17 2059  . clorazepate (TRANXENE) tablet 7.5 mg  7.5 mg Oral TID Edwin Dada, MD   7.5 mg at 02/07/17 1000  . donepezil (ARICEPT) tablet 10 mg  10 mg Oral QHS Edwin Dada, MD   10 mg at 02/06/17 2058  . heparin injection 5,000 Units  5,000 Units Subcutaneous Q8H Edwin Dada, MD   5,000 Units  at 02/07/17 0528  . HYDROcodone-acetaminophen (NORCO/VICODIN) 5-325 MG per tablet 1 tablet  1 tablet Oral Q8H Edwin Dada, MD   1 tablet at 02/07/17 0528  . memantine (NAMENDA XR) 24 hr capsule 28 mg  28 mg Oral Daily Edwin Dada, MD   28 mg at 02/07/17 0959  . pantoprazole (PROTONIX) EC tablet 40 mg  40 mg Oral Daily Edwin Dada, MD   40 mg at 02/07/17 0959  . QUEtiapine (SEROQUEL) tablet 25 mg  25 mg Oral QHS Edwin Dada, MD   25 mg at 02/06/17 2058  . simvastatin (ZOCOR) tablet 10 mg  10 mg Oral QHS Edwin Dada, MD   10 mg at 02/06/17 2059     Discharge Medications: Please see discharge summary for a list of discharge medications.  Relevant Imaging Results:  Relevant Lab Results:   Additional Information SSN: [id number]  Lia Hopping, LCSW

## 2017-02-07 NOTE — Clinical Social Work Note (Signed)
Clinical Social Work Assessment  Patient Details  Name: Katherine Cooper MRN: [id number] Date of Birth: [birth year]  Date of referral:  02/07/17               Reason for consult:  Facility Placement                Permission sought to share information with:  Family Supports, Customer service manager Permission granted to share information::  Yes, Verbal Permission Granted  Name::     Equities trader::  SNF  Relationship::  Daughter   Contact Information:  [id number]. [id number]  Housing/Transportation Living arrangements for the past 2 months:  Apartment Source of Information:  Patient Patient Interpreter Needed:  None Criminal Activity/Legal Involvement Pertinent to Current Situation/Hospitalization:  No - Comment as needed Significant Relationships:  Adult Children Lives with:  Self Do you feel safe going back to the place where you live?    Need for family participation in patient care:  No (Coment)  Care giving concerns:  PT recommends patient go to rehab before returning home.    Social Worker assessment / plan:  LCSWA met with patient at bedside, explained role and reason for SNF intervention. Patient reports she has been living alone in her apartment. Patient reports her children assist her with care but they cannot be at her home all day. Patient reports her daughter Katherine Cooper is the best family contact and gave LCSWA permission to call her. The patient reports she went to [facility] in the past, if possible wants to return.  LCSWA complete FL2/ PASRR/ provide bed offers when available.   Employment status:  Retired Nurse, adult PT Recommendations:  [place], [place] / Referral to community resources:  [place]  Patient/Family's Response to care:  Patient prefers to go home with home health but is agreeable to do temporary rehab.   Patient/Family's Understanding of and Emotional  Response to Diagnosis, Current Treatment, and Prognosis:  Patient understands home health will not be able to provide 24 hour care therefore is agreeable to going to SNF temporarily.   Emotional Assessment Appearance:    Attitude/Demeanor/Rapport:    Affect (typically observed):  Accepting, Calm Orientation:  Oriented to Self, Oriented to Place, Oriented to  Time Alcohol / Substance use:  Not Applicable Psych involvement (Current and /or in the community):  No (Comment)  Discharge Needs  Concerns to be addressed:  Discharge Planning Concerns, Care Coordination Readmission within the last 30 days:  No Current discharge risk:  None Barriers to Discharge:  Continued Medical Work up   [employer], [place] 02/07/2017, 12:19 PM

## 2017-02-07 NOTE — Evaluation (Signed)
Physical Therapy Evaluation Patient Details Name: Katherine Cooper MRN: [id number] DOB: 08-15-1941 Today's Date: 02/07/2017   History of Present Illness  76 y.o. 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 Pt currently with functional limitations due to the deficits listed below (see PT Problem List). Pt will benefit from skilled PT to increase their independence and safety with mobility to allow discharge to the venue listed below.  Pt presents with generalized weakness and c/o soreness over entire body limiting mobility at this time.  Per chart, pt from home alone.  Pt requiring max-total assist for bed mobility and unable to stand today.  Recommend SNF upon d/c.     Follow Up Recommendations Supervision/Assistance - 24 hour;SNF    Equipment Recommendations  Wheelchair (measurements PT);Hospital bed    Recommendations for Other Services       Precautions / Restrictions Precautions Precautions: Fall      Mobility  Bed Mobility Overal bed mobility: Needs Assistance Bed Mobility: Supine to Sit;Sit to Supine     Supine to sit: Max assist;+2 for physical assistance Sit to supine: Total assist;+2 for physical assistance   General bed mobility comments: increased time and effort, pt initiates movement however too weak to complete  Transfers                 General transfer comment: unable at this time  Ambulation/Gait                Stairs            Wheelchair Mobility    Modified Rankin (Stroke Patients Only)       Balance                                             Pertinent Vitals/Pain Pain Assessment: Faces Faces Pain Scale: Hurts little more Pain Location: bil lower legs tender to touch Pain  Descriptors / Indicators: Sore Pain Intervention(s): Limited activity within patient's tolerance;Monitored during session;Repositioned    Home Living Family/patient expects to be discharged to:: Private residence Living Arrangements: Alone               Additional Comments: pt poor historian, per chart from home alone    Prior Function Level of Independence: Independent with assistive device(s)               Hand Dominance        Extremity/Trunk Assessment   Upper Extremity Assessment Upper Extremity Assessment: Generalized weakness    Lower Extremity Assessment Lower Extremity Assessment: Generalized weakness (grossly 2+/5 throughout)       Communication   Communication: No difficulties  Cognition Arousal/Alertness: Awake/alert Behavior During Therapy: WFL for tasks assessed/performed Overall Cognitive Status: History of cognitive impairments - at baseline (hx of dementia, likely baseline)                      General Comments      Exercises     Assessment/Plan    PT Assessment Patient needs continued PT services  PT Problem List Decreased strength;Decreased mobility;Decreased balance;Decreased activity tolerance;Pain          PT Treatment Interventions DME  instruction;Gait training;Therapeutic activities;Therapeutic exercise;Functional mobility training;Patient/family education    PT Goals (Current goals can be found in the Care Plan section)  Acute Rehab PT Goals PT Goal Formulation: With patient Time For Goal Achievement: 02/14/17 Potential to Achieve Goals: Fair    Frequency Min 3X/week   Barriers to discharge        Co-evaluation               End of Session   Activity Tolerance: Patient limited by fatigue Patient left: in bed;with call bell/phone within reach;with bed alarm set;with nursing/sitter in room Nurse Communication: Mobility status         Time: [id number] PT Time Calculation (min) (ACUTE ONLY): 28  min   Charges:   PT Evaluation $PT Eval Moderate Complexity: 1 Procedure PT Treatments $Therapeutic Activity: 8-22 mins   PT G Codes:        Greene Diodato,KATHrine E 02/07/2017, 1:21 PM Carmelia Bake, PT, DPT 02/07/2017 Pager: [phone]

## 2017-02-07 NOTE — Progress Notes (Signed)
Echocardiogram 2D Echocardiogram has been performed.  Aggie Cosier 02/07/2017, 1:59 PM

## 2017-02-07 NOTE — Progress Notes (Signed)
PROGRESS NOTE    Katherine Cooper  [id number] DOB: Apr 19, 1941 DOA: 02/05/2017 PCP: Philis Fendt, MD   Brief Narrative: Rashaun Cassone Reidis a 76 y.o.femalewith a past medical history significant for moderate dementia, CKD IV baseline Cr 1.5, and HTNwho presented with leg swelling.  Patient has dementia and is an unreliable historian. She has had leg swelling for many months which became acutely worse recently.  She saw her PCP who prescribed antibiotic pills, but the area ulcerated so her family brought her to the ER.  She also endorsed SOB.  In the ER, she was septic with tachycardia, leukocytosis.  CXR demonstrated a possible left base pneumonia.  UA suggested possible UTI.  She had AKI.  She was started on IVF and antibiotics. Duplex of the left lower extremity was negative for DVT.  AKI is improving and sepsis physiology also slightly improved. WBC remains elevated and patient is on Abx.   Assessment & Plan:   Principal Problem:   Acute renal failure (ARF) (HCC) Active Problems:   Bilateral leg edema   Essential hypertension   CKD (chronic kidney disease) stage 4, GFR 15-29 ml/min (HCC)   Alzheimer's dementia, late onset, with behavioral disturbance   Leukocytosis   Leg swelling   Normocytic anemia  Acute kidney injury on CKD IV, - Baseline Cr 1.5-1.6 and 3.2 mg/dl on admission.  Take BID naprosyn, trending down with IVF - Renal US:  Medical renal disease - minimize nephrotoxins - Renally dose medications - BUN/Cr improved from 60/2.77 -> 39/1.98 (admission Cr was 3.28) - C/w NS at 75 mL/hr - Repeat CMP in AM  Sepsis due to possible CAP, left lower extremity cellulitis or UTI -  Sepsis physiology (tachycardia, leukocytosis) improving since admission; Mildly tachycardic and tachypenic  -  Continue IVF at 75 mL/hr - Influenza Negative and Urine Cx Negative - Blood Cx x 2 show NGTD at 1 day - Continue IV Ceftriaxone and Azthromycin for atypical coverage; May need to  broaden Coverage if worsening - WBC went from 17.2 -> 19.2 - Repeat CBC in Am - May obtain Respiratory Viral Panel if not improving  Cellulitis and leg swelling:  -likely worse on the left due to cellulitis, but underlying problem is likely venous insufficiency given the lateral/posterior location her ulcer.  Alternatively, perhaps this is gout flare.  The degree of tenderness suggests gout.   - Lower Extremity Duplex negative (D-Dimer Elevated but unable to get CTA of Chest because of Cr) -  Appreciate wound care assistance; Per [place] nurse she will provide Nursing with conservative care orders for the bilateral LEs while she is here so that we may continue to observe for resolution of erythema and edema.  Once discharged back to facility, would recommend continuing care with compression, such as with an [employer], with twice weekly initially and then once weekly changes. LE elevation and flotation of heels to avoid pressure injuries can also be included. -  Unna boots -  Continue ceftriaxone  -  ECHO showed Systolic Fxn of [id number] with Grade 1 Diastolic Dysfunction; Moderate Regurge and PA pressure was 56 - Strict I's and O's  Elevated D-Dimer -Obtain V/Q Scan -Has mild Sinus Tachycardia -Lower Extremity Duplex Negative  ? UTI; less likely as Urine had 6-30 Epithelial Cells -Urine Cx showed NGTD -Continue Empiric IV Ceftriaxone for CAP  Normocytic Anemia,  - Hemoglobin trending down and went from 9.1 -> 7.6 (?Dilutional) -  Iron studies, B12, folate -  TSH pending -  Occult stool ordered -  Repeat CBC in AM  Hypertension: -Continue to hold HCTZ, triamterene, Coreg -Continue statin, aspirin  Dementia: -Continue Memantine XR, Aricept 10 mg po Daily, BZD, Seroquel 25 mg po qHS  Anxiety: -C/w Tranxene 7.5 mg po TID  Hx of CVA -C/w ASA and Statin  Other medications: -Continue Norco PRN -Hold nonessential oxybutynin and cetirizine  DVT prophylaxis: Heparin 5,000  units sq q8h Code Status: FULL CODE Family Communication: No family present at bedside Disposition Plan: SNF when stable for D/C  Consultants:   None  Procedures: V/Q Ordered  ECHOCARDIOGRAM ------------------------------------------------------------------- Study Conclusions  - Left ventricle: The cavity size was normal. Systolic function was   normal. The estimated ejection fraction was in the range of 60%   to 65%. Wall motion was normal; there were no regional wall   motion abnormalities. Doppler parameters are consistent with   abnormal left ventricular relaxation (grade 1 diastolic   dysfunction). Doppler parameters are consistent with high   ventricular filling pressure. - Aortic valve: There was very mild stenosis. There was moderate   regurgitation. Peak velocity (S): 235 cm/s. Mean gradient (S): 11   mm Hg. Valve area (VTI): 2.92 cm^2. Valve area (Vmax): 3.07 cm^2.   Valve area (Vmean): 3.28 cm^2. - Mitral valve: Transvalvular velocity was within the normal range.   There was no evidence for stenosis. There was no regurgitation. - Right ventricle: The cavity size was normal. Wall thickness was   normal. Systolic function was normal. - Atrial septum: No defect or patent foramen ovale was identified   by color flow Doppler. - Tricuspid valve: There was mild-moderate regurgitation. - Pulmonary arteries: Systolic pressure was moderately increased.   PA peak pressure: 56 mm Hg (S).  Antimicrobials: Anti-infectives    Start     Dose/Rate Route Frequency Ordered Stop   02/06/17 1200  azithromycin (ZITHROMAX) 500 mg in dextrose 5 % 250 mL IVPB     500 mg 250 mL/hr over 60 Minutes Intravenous Every 24 hours 02/06/17 1120 02/09/17 1159   02/06/17 0315  cefTRIAXone (ROCEPHIN) 1 g in dextrose 5 % 50 mL IVPB     1 g 100 mL/hr over 30 Minutes Intravenous Daily at bedtime 02/06/17 0122       Subjective: Seen and examined at bedside and was not feeling well. No nausea or  vomiting but stated she was weak and hurt because of her arthritis.   Objective: Vitals:   02/06/17 0537 02/06/17 1351 02/06/17 2048 02/07/17 0500  BP: 118/70 114/73 112/67 (!) 144/74  Pulse: (!) 112 95 99 99  Resp: 18 18 18 20   Temp: 99.7 F (37.6 C) 99.5 F (37.5 C) 98.3 F (36.8 C) 99.4 F (37.4 C)  TempSrc: Oral Oral Oral Oral  SpO2: 100% 97% 98% 99%    Intake/Output Summary (Last 24 hours) at 02/07/17 0750 Last data filed at 02/07/17 0300  Gross per 24 hour  Intake             1165 ml  Output                0 ml  Net             1165 ml   There were no vitals filed for this visit.  Examination: Physical Exam:  Constitutional: Obese WN/WD AAF; In mild Distress ENMT: External Ears, Nose appear normal. Grossly normal hearing. Has residual difficulty speaking from prior CVA Neck: Appears normal, supple, no cervical masses, normal ROM,  no appreciable thyromegaly, no JVD Respiratory: Diminished to auscultation bilaterally, no wheezing, rales, rhonchi or crackles. Normal respiratory effort and patient is not tachypenic. No accessory muscle use.  Cardiovascular: RRR, no murmurs / rubs / gallops. S1 and S2 auscultated. Legs wrapped in [employer] but some swelling appreciated. Abdomen: Soft, mildly tender, non-distended. No masses palpated. No appreciable hepatosplenomegaly. Bowel sounds positive x4.  GU: Deferred. Musculoskeletal: No clubbing / cyanosis of digits/nails.  Skin: Unable to view ulceration on posterior left ankle. Warm and dry Neurologic: Residual Weakness and facial droop from prior CVA Psychiatric: Normal judgment and insight. Alert and oriented x 3. Anxious mood and appropriate affect.   Data Reviewed: I have personally reviewed following labs and imaging studies  CBC:  Recent Labs Lab 02/05/17 2055 02/06/17 0542 02/07/17 0554  WBC 19.8* 17.2* 19.2*  NEUTROABS 16.8*  --   --   HGB 9.1* 8.0* 7.6*  HCT 28.9* 25.9* 24.7*  MCV 87.3 85.2 87.9  PLT 430*  357 [id number]   Basic Metabolic Panel:  Recent Labs Lab 02/05/17 2135 02/06/17 0542 02/07/17 0554  NA 136 139 139  K 4.9 5.0 4.8  CL 107 110 111  CO2 19* 21* 21*  GLUCOSE 136* 132* 98  BUN 68* 60* 39*  CREATININE 3.28* 2.77* 1.98*  CALCIUM 10.9* 10.4* 10.3   GFR: CrCl cannot be calculated (Unknown ideal weight.). Liver Function Tests: No results for input(s): AST, ALT, ALKPHOS, BILITOT, PROT, ALBUMIN in the last 168 hours. No results for input(s): LIPASE, AMYLASE in the last 168 hours. No results for input(s): AMMONIA in the last 168 hours. Coagulation Profile: No results for input(s): INR, PROTIME in the last 168 hours. Cardiac Enzymes: No results for input(s): CKTOTAL, CKMB, CKMBINDEX, TROPONINI in the last 168 hours. BNP (last 3 results) No results for input(s): PROBNP in the last 8760 hours. HbA1C: No results for input(s): HGBA1C in the last 72 hours. CBG: No results for input(s): GLUCAP in the last 168 hours. Lipid Profile: No results for input(s): CHOL, HDL, LDLCALC, TRIG, CHOLHDL, LDLDIRECT in the last 72 hours. Thyroid Function Tests: No results for input(s): TSH, T4TOTAL, FREET4, T3FREE, THYROIDAB in the last 72 hours. Anemia Panel: No results for input(s): VITAMINB12, FOLATE, FERRITIN, TIBC, IRON, RETICCTPCT in the last 72 hours. Sepsis Labs:  Recent Labs Lab 02/05/17 2331  LATICACIDVEN 1.24   No results found for this or any previous visit (from the past 240 hour(s)).   Radiology Studies: Dg Chest 2 View  Result Date: 02/05/2017 CLINICAL DATA:  Shortness breath and nausea for 1 month. History of bronchitis, hypertension, heart murmur. Former smoker. EXAM: CHEST  2 VIEW COMPARISON:  02/22/2016 FINDINGS: Shallow inspiration with mild linear atelectasis in the lung bases. Cardiac enlargement without vascular congestion. Focal increased density in the left lung base posteriorly suggesting focal pneumonia. Tortuous and dilated aorta. No blunting of costophrenic  angles. No pneumothorax. IMPRESSION: Focal increased density in the left lung base posteriorly suggests focal pneumonia. Shallow inspiration. Dilated and tortuous aorta. Electronically Signed   By: Lucienne Capers M.D.   On: 02/05/2017 21:08   US Renal  Result Date: 02/06/2017 CLINICAL DATA:  Increased BUN creatinine, chronic kidney disease, hypertension EXAM: RENAL / URINARY TRACT ULTRASOUND COMPLETE COMPARISON:  11/13/2011 FINDINGS: Right Kidney: Length: 11 cm. Echogenicity within normal limits. No mass or hydronephrosis visualized. Left Kidney: Length: 10.5 cm. Echogenicity within normal limits. No mass or hydronephrosis visualized. Mild bilateral renal cortical thinning probable due to atrophy. Bladder: Appears normal for degree  of bladder distention. IMPRESSION: No hydronephrosis or renal calculi. Mild bilateral renal cortical thinning probable due to mild atrophy. Unremarkable urinary bladder. Electronically Signed   By: Lahoma Crocker M.D.   On: 02/06/2017 10:00   Scheduled Meds: . aspirin EC  81 mg Oral Daily  . azithromycin  500 mg Intravenous Q24H  . cefTRIAXone (ROCEPHIN)  IV  1 g Intravenous QHS  . clorazepate  7.5 mg Oral TID  . donepezil  10 mg Oral QHS  . heparin  5,000 Units Subcutaneous Q8H  . HYDROcodone-acetaminophen  1 tablet Oral Q8H  . memantine  28 mg Oral Daily  . pantoprazole  40 mg Oral Daily  . QUEtiapine  25 mg Oral QHS  . simvastatin  10 mg Oral QHS   Continuous Infusions: . sodium chloride 75 mL/hr at 02/06/17 1745    LOS: 2 days   Kerney Elbe, DO [facility] Pager [phone]  If 7PM-7AM, please contact night-coverage [email or web address] Password [email or web address] 02/07/2017, 7:50 AM

## 2017-02-07 NOTE — Care Management Note (Signed)
Case Management Note  Patient Details  Name: Katherine Cooper MRN: [id number] Date of Birth: [birth year]  Subjective/Objective: 76 y.o. F admitted from home where she has the assistance of her 4 adult children. She tells me she has 3 daughters and one son. Has given me permission to talk with Mariann Laster, whom she is "closest" to. Has used  [facility] in the past and I have requested info with Rep for this agency.                   Action/Plan: Will wait on PT evaluation and return call from [facility].    Expected Discharge Date:                  Expected Discharge Plan:  [place]  In-House Referral:  Clinical Social Work  Discharge planning Services  CM Consult  Post Acute Care Choice:  Home Health Choice offered to:  Patient  DME Arranged:   (Has Conservation officer, nature with [place], and [facility]. ) DME Agency:  NA  HH Arranged:  RN, PT HH Agency:     Status of Service:  In process, will continue to follow  If discussed at Long Length of Stay Meetings, dates discussed:    Additional Comments:  Delrae Sawyers, RN 02/07/2017, 9:46 AM

## 2017-02-07 NOTE — Progress Notes (Signed)
[facility] will assist with SNF if rehab is needed, waiting for PT recommendations.

## 2017-02-08 ENCOUNTER — Inpatient Hospital Stay (HOSPITAL_COMMUNITY): Payer: Medicare Other

## 2017-02-08 DIAGNOSIS — A419 Sepsis, unspecified organism: Principal | ICD-10-CM

## 2017-02-08 LAB — CBC WITH DIFFERENTIAL/PLATELET
Basophils Absolute: 0 10*3/uL (ref 0.0–0.1)
Basophils Relative: 0 %
Eosinophils Absolute: 0.3 10*3/uL (ref 0.0–0.7)
Eosinophils Relative: 2 %
HEMATOCRIT: 24.5 % — AB (ref 36.0–46.0)
Hemoglobin: 7.7 g/dL — ABNORMAL LOW (ref 12.0–15.0)
LYMPHS ABS: 1.3 10*3/uL (ref 0.7–4.0)
Lymphocytes Relative: 8 %
MCH: 27 pg (ref 26.0–34.0)
MCHC: 31.4 g/dL (ref 30.0–36.0)
MCV: 86 fL (ref 78.0–100.0)
MONO ABS: 1.4 10*3/uL — AB (ref 0.1–1.0)
MONOS PCT: 9 %
NEUTROS ABS: 13 10*3/uL — AB (ref 1.7–7.7)
Neutrophils Relative %: 81 %
PLATELETS: 336 10*3/uL (ref 150–400)
RBC: 2.85 MIL/uL — AB (ref 3.87–5.11)
RDW: 16.7 % — AB (ref 11.5–15.5)
WBC: 16 10*3/uL — AB (ref 4.0–10.5)

## 2017-02-08 LAB — TSH: TSH: 1.349 u[IU]/mL (ref 0.350–4.500)

## 2017-02-08 LAB — COMPREHENSIVE METABOLIC PANEL
ALT: 18 U/L (ref 14–54)
ANION GAP: 5 (ref 5–15)
AST: 19 U/L (ref 15–41)
Albumin: 2.1 g/dL — ABNORMAL LOW (ref 3.5–5.0)
Alkaline Phosphatase: 137 U/L — ABNORMAL HIGH (ref 38–126)
BILIRUBIN TOTAL: 0.9 mg/dL (ref 0.3–1.2)
BUN: 24 mg/dL — ABNORMAL HIGH (ref 6–20)
CHLORIDE: 113 mmol/L — AB (ref 101–111)
CO2: 22 mmol/L (ref 22–32)
Calcium: 10.3 mg/dL (ref 8.9–10.3)
Creatinine, Ser: 1.57 mg/dL — ABNORMAL HIGH (ref 0.44–1.00)
GFR, EST AFRICAN AMERICAN: 36 mL/min — AB (ref >60)
GFR, EST NON AFRICAN AMERICAN: 31 mL/min — AB (ref >60)
Glucose, Bld: 99 mg/dL (ref 65–99)
POTASSIUM: 4.5 mmol/L (ref 3.5–5.1)
Sodium: 140 mmol/L (ref 135–145)
TOTAL PROTEIN: 6.8 g/dL (ref 6.5–8.1)

## 2017-02-08 LAB — MAGNESIUM: MAGNESIUM: 1.9 mg/dL (ref 1.7–2.4)

## 2017-02-08 LAB — PHOSPHORUS: PHOSPHORUS: 2.6 mg/dL (ref 2.5–4.6)

## 2017-02-08 LAB — T4, FREE: Free T4: 0.97 ng/dL (ref 0.61–1.12)

## 2017-02-08 MED ORDER — DEXTROSE 5 % IV SOLN
2.0000 g | INTRAVENOUS | Status: DC
  Administered 2017-02-09: 2 g via INTRAVENOUS
  Filled 2017-02-08: qty 2

## 2017-02-08 MED ORDER — DEXTROSE 5 % IV SOLN
2.0000 g | Freq: Once | INTRAVENOUS | Status: AC
  Administered 2017-02-08: 2 g via INTRAVENOUS
  Filled 2017-02-08: qty 2

## 2017-02-08 MED ORDER — TECHNETIUM TO 99M ALBUMIN AGGREGATED
4.1000 | Freq: Once | INTRAVENOUS | Status: AC | PRN
  Administered 2017-02-08: 4.1 via INTRAVENOUS

## 2017-02-08 MED ORDER — TECHNETIUM TC 99M DIETHYLENETRIAME-PENTAACETIC ACID
31.2000 | Freq: Once | INTRAVENOUS | Status: AC | PRN
  Administered 2017-02-08: 31.2 via RESPIRATORY_TRACT

## 2017-02-08 MED ORDER — VANCOMYCIN HCL 10 G IV SOLR
1250.0000 mg | INTRAVENOUS | Status: DC
  Administered 2017-02-08 – 2017-02-09 (×2): 1250 mg via INTRAVENOUS
  Filled 2017-02-08 (×2): qty 1250

## 2017-02-08 NOTE — Progress Notes (Signed)
Pharmacy Antibiotic Note  Katherine Cooper is a 76 y.o. female admitted on 02/05/2017 with sepsis due to possible CAP, left lower extremity cellulitis or UTI.  Initially started on Ceftriaxone and Azithromycin on admission but fever worsened, now pharmacy has been consulted for cefepime and vancomycin dosing.  Plan: Cefepime 2g q24h. Vancomycin 1250 mg q24h.     Temp (24hrs), Avg:100.4 F (38 C), Min:99.2 F (37.3 C), Max:101.9 F (38.8 C)   Recent Labs Lab 02/05/17 2055 02/05/17 2135 02/05/17 2331 02/06/17 0542 02/07/17 0554 02/08/17 0514  WBC 19.8*  --   --  17.2* 19.2* 16.0*  CREATININE  --  3.28*  --  2.77* 1.98* 1.57*  LATICACIDVEN  --   --  1.24  --   --   --     CrCl cannot be calculated (Unknown ideal weight.).    Allergies  Allergen Reactions  . Sulfa Antibiotics Itching    Antimicrobials this admission: 2/13 Ceftriaxone >> 2/15 2/13 Azithromycin >> 2/15 2/15 Cefepime >> 2/15 Vancomycin >>  Dose adjustments this admission:   Microbiology results: 2/12 BCx: ngtd 2/15 BCx: sent 2/12 UCx: NGF   Thank you for allowing pharmacy to be a part of this patient's care.  Hershal Coria 02/08/2017 8:23 PM

## 2017-02-08 NOTE — Care Management Note (Addendum)
Case Management Note  Patient Details  Name: Katherine Cooper MRN: [id number] Date of Birth: 24-Nov-1941  Subjective/Objective:                  Acute renal failure (ARF) [facility]) Action/Plan: Discharge planning Expected Discharge Date:                  Expected Discharge Plan:  Skilled Nursing Facility  In-House Referral:  Clinical Social Work  Discharge planning Services  CM Consult  Post Acute Care Choice:   Choice offered to:  Patient  DME Arranged:   DME Agency:  NA  HH Arranged:   [place] Agency:     Status of Service:  Completed, signed off  If discussed at [employer], dates discussed:    Additional Comments: CM notes and confirms with CSW and MD plan is now for SNF; CSW arranging. No other CM needs were communicated. Dellie Catholic, RN 02/08/2017, 9:49 AM

## 2017-02-08 NOTE — Care Management Note (Deleted)
Case Management Note  Patient Details  Name: Katherine Cooper MRN: [id number] Date of Birth: 1941/09/01  Subjective/Objective:                    Action/Plan:   Expected Discharge Date:                  Expected Discharge Plan:  Skilled Nursing Facility  In-House Referral:  Clinical Social Work  Discharge planning Services  CM Consult  Post Acute Care Choice:   Choice offered to:  Patient  DME Arranged:   DME Agency:  NA  HH Arranged:   [place] Agency:     Status of Service:  Completed, signed off  If discussed at [employer], dates discussed:    Additional Comments:  Dellie Catholic, RN 02/08/2017, 12:38 PM

## 2017-02-08 NOTE — Progress Notes (Signed)
PROGRESS NOTE    Katherine Cooper  [id number] DOB: 12-04-1941 DOA: 02/05/2017 PCP: Philis Fendt, MD   Brief Narrative: Katherine Cooper a 76 y.o.femalewith a past medical history significant for moderate dementia, CKD IV baseline Cr 1.5, and HTNwho presented with leg swelling.  Patient has dementia and is an unreliable historian. She has had leg swelling for many months which became acutely worse recently.  She saw her PCP who prescribed antibiotic pills, but the area ulcerated so her family brought her to the ER.  She also endorsed SOB.  In the ER, she was septic with tachycardia, leukocytosis.  CXR demonstrated a possible left base pneumonia.  UA suggested possible UTI.  She had AKI.  She was started on IVF and antibiotics. Duplex of the left lower extremity was negative for DVT.  AKI is improving and sepsis physiology also slightly improved. WBC remains elevated and patient is on Abx.   Assessment & Plan:   Principal Problem:   Acute renal failure (ARF) (HCC) Active Problems:   Bilateral leg edema   Essential hypertension   CKD (chronic kidney disease) stage 4, GFR 15-29 ml/min (HCC)   Alzheimer's dementia, late onset, with behavioral disturbance   Leukocytosis   Leg swelling   Normocytic anemia  Acute kidney injury on CKD IV, - Baseline Cr 1.5-1.6 and 3.2 mg/dl on admission.  Take BID naprosyn, trending down with IVF - Renal US:  Medical renal disease - minimize nephrotoxins - Renally dose medications - BUN/Cr improved from 60/2.77 -> 39/1.98 -> 24/1.57(admission Cr was 3.28) - C/w NS at 50 mL/hr - Repeat CMP in AM  Sepsis due to possible CAP, left lower extremity cellulitis or UTI -  Sepsis physiology (tachycardia, leukocytosis) improving since admission; Mildly tachycardic and tachypenic however spiked a Fever of 102 this AM. - Continue IVF at 50 mL/hr - Influenza Negative and Urine Cx Negative - Blood Cx x 2 show NGTD at 2 day; Repeat Blood Cx drawn as  patient spiked a fever - D/C'd IV Ceftriaxone and Azthromycin for atypical coverage and started IV Vancomycin and IV Cefepime as fever worsened  - WBC went from 17.2 -> 19.2 -> 16.0 - Repeat CBC in Am - May obtain Respiratory Viral Panel if not improving  Cellulitis and leg swelling:  -likely worse on the left due to cellulitis, but underlying problem is likely venous insufficiency given the lateral/posterior location her ulcer.  Alternatively, perhaps this is gout flare.  The degree of tenderness suggests gout.   - Lower Extremity Duplex negative (D-Dimer Elevated but unable to get CTA of Chest because of Cr) -  Appreciate wound care assistance; Per [place] nurse she will provide Nursing with conservative care orders for the bilateral LEs while she is here so that we may continue to observe for resolution of erythema and edema.  Once discharged back to facility, would recommend continuing care with compression, such as with an [employer], with twice weekly initially and then once weekly changes. LE elevation and flotation of heels to avoid pressure injuries can also be included. -  Unna boots - Antibiotics changed to Vanc and Cefepime  -  ECHO showed Systolic Fxn of 123456 with Grade 1 Diastolic Dysfunction; Moderate Regurge and PA pressure was 56 - Strict I's and O's  Elevated D-Dimer -V/Q Scan showed No ventilation-perfusion mismatch to suggest pulmonary embolism is noted. -Has mild Sinus Tachycardia and likely 2/2 to Infection  -Lower Extremity Duplex Negative  ? UTI; less likely as Urine  had 6-30 Epithelial Cells -Urine Cx showed NGTD -Changed Empiric IV Ceftriaxone for CAP to Vanc and Cefepime  Normocytic Anemia,  - Hemoglobin trending down and went from 9.1 -> 7.6 -> 7.7 (?Dilutional) -  Iron studies, B12, folate -  TSH pending -  Occult stool ordered -  Repeat CBC in AM  Hypertension: -Continue to hold HCTZ, triamterene, Coreg -Continue statin,  aspirin  Dementia: -Continue Memantine XR, Aricept 10 mg po Daily, BZD, Seroquel 25 mg po qHS  Anxiety: -C/w Tranxene 7.5 mg po TID  Hx of CVA -C/w ASA and Statin  Other medications: -Continue Norco PRN -Hold nonessential oxybutynin and cetirizine  DVT prophylaxis: Heparin 5,000 units sq q8h Code Status: FULL CODE Family Communication: No family present at bedside Disposition Plan: SNF when stable for D/C  Consultants:   None  Procedures: V/Q Ordered  ECHOCARDIOGRAM ------------------------------------------------------------------- Study Conclusions  - Left ventricle: The cavity size was normal. Systolic function was   normal. The estimated ejection fraction was in the range of 60%   to 65%. Wall motion was normal; there were no regional wall   motion abnormalities. Doppler parameters are consistent with   abnormal left ventricular relaxation (grade 1 diastolic   dysfunction). Doppler parameters are consistent with high   ventricular filling pressure. - Aortic valve: There was very mild stenosis. There was moderate   regurgitation. Peak velocity (S): 235 cm/s. Mean gradient (S): 11   mm Hg. Valve area (VTI): 2.92 cm^2. Valve area (Vmax): 3.07 cm^2.   Valve area (Vmean): 3.28 cm^2. - Mitral valve: Transvalvular velocity was within the normal range.   There was no evidence for stenosis. There was no regurgitation. - Right ventricle: The cavity size was normal. Wall thickness was   normal. Systolic function was normal. - Atrial septum: No defect or patent foramen ovale was identified   by color flow Doppler. - Tricuspid valve: There was mild-moderate regurgitation. - Pulmonary arteries: Systolic pressure was moderately increased.   PA peak pressure: 56 mm Hg (S).  Antimicrobials: Anti-infectives    Start     Dose/Rate Route Frequency Ordered Stop   02/08/17 2030  ceFEPIme (MAXIPIME) 2 g in dextrose 5 % 50 mL IVPB     2 g 100 mL/hr over 30 Minutes Intravenous   Once 02/08/17 2018     02/06/17 1200  azithromycin (ZITHROMAX) 500 mg in dextrose 5 % 250 mL IVPB     500 mg 250 mL/hr over 60 Minutes Intravenous Every 24 hours 02/06/17 1120 02/08/17 1454   02/06/17 0315  cefTRIAXone (ROCEPHIN) 1 g in dextrose 5 % 50 mL IVPB  Status:  Discontinued     1 g 100 mL/hr over 30 Minutes Intravenous Daily at bedtime 02/06/17 0122 02/08/17 1551     Subjective: Seen and examined at bedside and was still not feeling well. Stated body ached all over and didn't want me to touch her Left leg where the wound was. No nausea or vomiting and states she feels very deconditioned.   Objective: Vitals:   02/07/17 2234 02/08/17 0425 02/08/17 1500 02/08/17 1514  BP:  125/71 109/73   Pulse:  98 (!) 111   Resp:  20 20   Temp: 100.1 F (37.8 C) 99.2 F (37.3 C) (!) 101.9 F (38.8 C) (!) 100.4 F (38 C)  TempSrc: Oral Oral Oral Oral  SpO2:  99% 100%     Intake/Output Summary (Last 24 hours) at 02/08/17 2021 Last data filed at 02/08/17 1700  Gross per  24 hour  Intake             3650 ml  Output                0 ml  Net             3650 ml   There were no vitals filed for this visit.  Examination: Physical Exam:  Constitutional: Obese WN/WD AAF; NAD appears uncomfortable though ENMT: External Ears, Nose appear normal. Grossly normal hearing. Has residual difficulty speaking from prior CVA Neck: Appears normal, supple, no cervical masses, normal ROM, no appreciable thyromegaly, no JVD Respiratory: Diminished to auscultation bilaterally, no wheezing, rales, rhonchi or crackles. Normal respiratory effort and patient is not tachypenic. No accessory muscle use.  Cardiovascular: RRR, no murmurs / rubs / gallops. S1 and S2 auscultated. Legs wrapped in [employer] but some swelling appreciated. Abdomen: Soft, mildly tender, non-distended. No masses palpated. No appreciable hepatosplenomegaly. Bowel sounds positive x4.  GU: Deferred. Musculoskeletal: No clubbing / cyanosis  of digits/nails.  Skin: Unable to view ulceration on posterior left ankle as wrapped in boots. Warm and dry Neurologic: Residual Weakness and facial droop from prior CVA Psychiatric: Normal judgment and insight. Alert and oriented x 3. Anxious mood and appropriate affect.   Data Reviewed: I have personally reviewed following labs and imaging studies  CBC:  Recent Labs Lab 02/05/17 2055 02/06/17 0542 02/07/17 0554 02/08/17 0514  WBC 19.8* 17.2* 19.2* 16.0*  NEUTROABS 16.8*  --   --  13.0*  HGB 9.1* 8.0* 7.6* 7.7*  HCT 28.9* 25.9* 24.7* 24.5*  MCV 87.3 85.2 87.9 86.0  PLT 430* 357 330 [id number]   Basic Metabolic Panel:  Recent Labs Lab 02/05/17 2135 02/06/17 0542 02/07/17 0554 02/08/17 0514  NA 136 139 139 140  K 4.9 5.0 4.8 4.5  CL 107 110 111 113*  CO2 19* 21* 21* 22  GLUCOSE 136* 132* 98 99  BUN 68* 60* 39* 24*  CREATININE 3.28* 2.77* 1.98* 1.57*  CALCIUM 10.9* 10.4* 10.3 10.3  MG  --   --   --  1.9  PHOS  --   --   --  2.6   GFR: CrCl cannot be calculated (Unknown ideal weight.). Liver Function Tests:  Recent Labs Lab 02/08/17 0514  AST 19  ALT 18  ALKPHOS 137*  BILITOT 0.9  PROT 6.8  ALBUMIN 2.1*   No results for input(s): LIPASE, AMYLASE in the last 168 hours. No results for input(s): AMMONIA in the last 168 hours. Coagulation Profile: No results for input(s): INR, PROTIME in the last 168 hours. Cardiac Enzymes: No results for input(s): CKTOTAL, CKMB, CKMBINDEX, TROPONINI in the last 168 hours. BNP (last 3 results) No results for input(s): PROBNP in the last 8760 hours. HbA1C: No results for input(s): HGBA1C in the last 72 hours. CBG: No results for input(s): GLUCAP in the last 168 hours. Lipid Profile: No results for input(s): CHOL, HDL, LDLCALC, TRIG, CHOLHDL, LDLDIRECT in the last 72 hours. Thyroid Function Tests:  Recent Labs  02/08/17 0514  TSH 1.349  FREET4 0.97   Anemia Panel: No results for input(s): VITAMINB12, FOLATE, FERRITIN,  TIBC, IRON, RETICCTPCT in the last 72 hours. Sepsis Labs:  Recent Labs Lab 02/05/17 2331  LATICACIDVEN 1.24   Recent Results (from the past 240 hour(s))  Blood Culture (routine x 2)     Status: None (Preliminary result)   Collection Time: 02/05/17 11:19 PM  Result Value Ref Range Status   Specimen  Description BLOOD RIGHT ANTECUBITAL  Final   Special Requests BOTTLES DRAWN AEROBIC AND ANAEROBIC 5 CC  Final   Culture   Final    NO GROWTH 2 DAYS Performed at [facility], [address], [place], [place] [address]    Report Status PENDING  Incomplete  Urine culture     Status: None   Collection Time: 02/05/17 11:19 PM  Result Value Ref Range Status   Specimen Description URINE, CLEAN CATCH  Final   Special Requests NONE  Final   Culture   Final    NO GROWTH Performed at [facility], [address] [place], [place] [address]    Report Status 02/07/2017 FINAL  Final  Blood Culture (routine x 2)     Status: None (Preliminary result)   Collection Time: 02/05/17 11:20 PM  Result Value Ref Range Status   Specimen Description BLOOD LEFT ANTECUBITAL  Final   Special Requests BOTTLES DRAWN AEROBIC AND ANAEROBIC 5 CC  Final   Culture   Final    NO GROWTH 2 DAYS Performed at [facility], [place] [address] [place], [place] [address]    Report Status PENDING  Incomplete     Radiology Studies: Nm Pulmonary Perf And Vent  Result Date: 02/08/2017 CLINICAL DATA:  Shortness of breath on exertion EXAM: NUCLEAR MEDICINE VENTILATION - PERFUSION LUNG SCAN TECHNIQUE: Ventilation images were obtained in multiple projections using inhaled aerosol Tc-25m DTPA. Perfusion images were obtained in multiple projections after intravenous injection of Tc-4m MAA. RADIOPHARMACEUTICALS:  31.2 mCi Technetium-53m DTPA aerosol inhalation and 4.1 mCi Technetium-64m MAA IV COMPARISON:  02/05/2017 FINDINGS: Ventilation: No focal ventilation defect. Perfusion: No wedge shaped peripheral perfusion  defects to suggest acute pulmonary embolism. IMPRESSION: No ventilation-perfusion mismatch to suggest pulmonary embolism is noted. Electronically Signed   By: Inez Catalina M.D.   On: 02/08/2017 14:15   Scheduled Meds: . aspirin EC  81 mg Oral Daily  . ceFEPime (MAXIPIME) IV  2 g Intravenous Once  . clorazepate  7.5 mg Oral TID  . donepezil  10 mg Oral QHS  . heparin  5,000 Units Subcutaneous Q8H  . HYDROcodone-acetaminophen  1 tablet Oral Q8H  . memantine  28 mg Oral Daily  . pantoprazole  40 mg Oral Daily  . QUEtiapine  25 mg Oral QHS  . simvastatin  10 mg Oral QHS   Continuous Infusions: . sodium chloride 75 mL/hr at 02/08/17 1153    LOS: 3 days   Kerney Elbe, DO [facility] Pager [phone]  If 7PM-7AM, please contact night-coverage [email or web address] Password [email or web address] 02/08/2017, 8:21 PM

## 2017-02-09 DIAGNOSIS — R1084 Generalized abdominal pain: Secondary | ICD-10-CM | POA: Diagnosis not present

## 2017-02-09 DIAGNOSIS — L03116 Cellulitis of left lower limb: Secondary | ICD-10-CM | POA: Diagnosis not present

## 2017-02-09 DIAGNOSIS — E860 Dehydration: Secondary | ICD-10-CM | POA: Diagnosis not present

## 2017-02-09 DIAGNOSIS — M109 Gout, unspecified: Secondary | ICD-10-CM | POA: Diagnosis not present

## 2017-02-09 DIAGNOSIS — R5381 Other malaise: Secondary | ICD-10-CM | POA: Diagnosis not present

## 2017-02-09 DIAGNOSIS — I739 Peripheral vascular disease, unspecified: Secondary | ICD-10-CM | POA: Diagnosis not present

## 2017-02-09 DIAGNOSIS — G301 Alzheimer's disease with late onset: Secondary | ICD-10-CM | POA: Diagnosis not present

## 2017-02-09 DIAGNOSIS — R600 Localized edema: Secondary | ICD-10-CM | POA: Diagnosis not present

## 2017-02-09 DIAGNOSIS — R52 Pain, unspecified: Secondary | ICD-10-CM | POA: Diagnosis not present

## 2017-02-09 DIAGNOSIS — R278 Other lack of coordination: Secondary | ICD-10-CM | POA: Diagnosis not present

## 2017-02-09 DIAGNOSIS — M79671 Pain in right foot: Secondary | ICD-10-CM | POA: Diagnosis not present

## 2017-02-09 DIAGNOSIS — M6281 Muscle weakness (generalized): Secondary | ICD-10-CM | POA: Diagnosis not present

## 2017-02-09 DIAGNOSIS — K649 Unspecified hemorrhoids: Secondary | ICD-10-CM | POA: Diagnosis not present

## 2017-02-09 DIAGNOSIS — D649 Anemia, unspecified: Secondary | ICD-10-CM | POA: Diagnosis not present

## 2017-02-09 DIAGNOSIS — R5383 Other fatigue: Secondary | ICD-10-CM | POA: Diagnosis not present

## 2017-02-09 DIAGNOSIS — A419 Sepsis, unspecified organism: Secondary | ICD-10-CM | POA: Diagnosis not present

## 2017-02-09 DIAGNOSIS — I129 Hypertensive chronic kidney disease with stage 1 through stage 4 chronic kidney disease, or unspecified chronic kidney disease: Secondary | ICD-10-CM | POA: Diagnosis not present

## 2017-02-09 DIAGNOSIS — R1312 Dysphagia, oropharyngeal phase: Secondary | ICD-10-CM | POA: Diagnosis not present

## 2017-02-09 DIAGNOSIS — N184 Chronic kidney disease, stage 4 (severe): Secondary | ICD-10-CM | POA: Diagnosis not present

## 2017-02-09 DIAGNOSIS — J189 Pneumonia, unspecified organism: Secondary | ICD-10-CM

## 2017-02-09 DIAGNOSIS — R112 Nausea with vomiting, unspecified: Secondary | ICD-10-CM | POA: Diagnosis not present

## 2017-02-09 DIAGNOSIS — R2681 Unsteadiness on feet: Secondary | ICD-10-CM | POA: Diagnosis not present

## 2017-02-09 DIAGNOSIS — Z8673 Personal history of transient ischemic attack (TIA), and cerebral infarction without residual deficits: Secondary | ICD-10-CM | POA: Diagnosis not present

## 2017-02-09 DIAGNOSIS — E785 Hyperlipidemia, unspecified: Secondary | ICD-10-CM | POA: Diagnosis not present

## 2017-02-09 DIAGNOSIS — L03115 Cellulitis of right lower limb: Secondary | ICD-10-CM | POA: Diagnosis not present

## 2017-02-09 DIAGNOSIS — I10 Essential (primary) hypertension: Secondary | ICD-10-CM | POA: Diagnosis not present

## 2017-02-09 DIAGNOSIS — M79672 Pain in left foot: Secondary | ICD-10-CM | POA: Diagnosis not present

## 2017-02-09 DIAGNOSIS — N3281 Overactive bladder: Secondary | ICD-10-CM | POA: Diagnosis not present

## 2017-02-09 DIAGNOSIS — I959 Hypotension, unspecified: Secondary | ICD-10-CM | POA: Diagnosis not present

## 2017-02-09 DIAGNOSIS — N179 Acute kidney failure, unspecified: Secondary | ICD-10-CM | POA: Diagnosis not present

## 2017-02-09 DIAGNOSIS — K5900 Constipation, unspecified: Secondary | ICD-10-CM | POA: Diagnosis not present

## 2017-02-09 DIAGNOSIS — K219 Gastro-esophageal reflux disease without esophagitis: Secondary | ICD-10-CM | POA: Diagnosis not present

## 2017-02-09 DIAGNOSIS — R0989 Other specified symptoms and signs involving the circulatory and respiratory systems: Secondary | ICD-10-CM | POA: Diagnosis not present

## 2017-02-09 LAB — CBC WITH DIFFERENTIAL/PLATELET
BASOS ABS: 0 10*3/uL (ref 0.0–0.1)
BASOS PCT: 0 %
Eosinophils Absolute: 0.2 10*3/uL (ref 0.0–0.7)
Eosinophils Relative: 2 %
HCT: 23.1 % — ABNORMAL LOW (ref 36.0–46.0)
Hemoglobin: 7.1 g/dL — ABNORMAL LOW (ref 12.0–15.0)
Lymphocytes Relative: 8 %
Lymphs Abs: 1.1 10*3/uL (ref 0.7–4.0)
MCH: 26.3 pg (ref 26.0–34.0)
MCHC: 30.7 g/dL (ref 30.0–36.0)
MCV: 85.6 fL (ref 78.0–100.0)
MONO ABS: 1.5 10*3/uL — AB (ref 0.1–1.0)
Monocytes Relative: 10 %
NEUTROS PCT: 80 %
Neutro Abs: 11.4 10*3/uL — ABNORMAL HIGH (ref 1.7–7.7)
Platelets: 296 10*3/uL (ref 150–400)
RBC: 2.7 MIL/uL — AB (ref 3.87–5.11)
RDW: 16.5 % — AB (ref 11.5–15.5)
WBC: 14.3 10*3/uL — AB (ref 4.0–10.5)

## 2017-02-09 LAB — COMPREHENSIVE METABOLIC PANEL
ALBUMIN: 1.9 g/dL — AB (ref 3.5–5.0)
ALT: 17 U/L (ref 14–54)
AST: 18 U/L (ref 15–41)
Alkaline Phosphatase: 127 U/L — ABNORMAL HIGH (ref 38–126)
Anion gap: 6 (ref 5–15)
BILIRUBIN TOTAL: 0.6 mg/dL (ref 0.3–1.2)
BUN: 18 mg/dL (ref 6–20)
CHLORIDE: 112 mmol/L — AB (ref 101–111)
CO2: 21 mmol/L — ABNORMAL LOW (ref 22–32)
Calcium: 10.5 mg/dL — ABNORMAL HIGH (ref 8.9–10.3)
Creatinine, Ser: 1.43 mg/dL — ABNORMAL HIGH (ref 0.44–1.00)
GFR calc Af Amer: 40 mL/min — ABNORMAL LOW (ref >60)
GFR calc non Af Amer: 35 mL/min — ABNORMAL LOW (ref >60)
GLUCOSE: 100 mg/dL — AB (ref 65–99)
POTASSIUM: 4.4 mmol/L (ref 3.5–5.1)
Sodium: 139 mmol/L (ref 135–145)
Total Protein: 6.3 g/dL — ABNORMAL LOW (ref 6.5–8.1)

## 2017-02-09 LAB — MAGNESIUM: Magnesium: 1.8 mg/dL (ref 1.7–2.4)

## 2017-02-09 LAB — PHOSPHORUS: Phosphorus: 2.8 mg/dL (ref 2.5–4.6)

## 2017-02-09 MED ORDER — LEVOFLOXACIN 750 MG PO TABS: 750.0000 mg | ORAL_TABLET | ORAL | 0 refills | Status: DC

## 2017-02-09 MED ORDER — HYDROCODONE-ACETAMINOPHEN 5-325 MG PO TABS: 1.0000 | ORAL_TABLET | Freq: Three times a day (TID) | ORAL | 0 refills | Status: DC | PRN

## 2017-02-09 NOTE — Progress Notes (Signed)
Report given to Olivia Mackie at [facility]. No questions or concerns at this time. Patient transferred via [facility] to the facility.

## 2017-02-09 NOTE — Care Management Important Message (Signed)
Important Message  Patient Details  Name: [name] MRN: [id number] Date of Birth: 1941/05/16   Medicare Important Message Given:  Yes    Kerin Salen 02/09/2017, 10:52 AMImportant Message  Patient Details  Name: [name] MRN: [id number] Date of Birth: [birth year]   Medicare Important Message Given:  Yes    Kerin Salen 02/09/2017, 10:52 AM

## 2017-02-09 NOTE — Discharge Summary (Addendum)
Physician Discharge Summary  Katherine Cooper [id number] DOB: Feb 15, 1941 DOA: 02/05/2017  PCP: Philis Fendt, MD  Admit date: 02/05/2017 Discharge date: 02/09/2017   Admitted From: Home Disposition:  SNF at [facility]  Recommendations for Outpatient Follow-up:  1. Follow up with PCP in 1-2 weeks 2. Follow up with Cardiology at D/C 3. Please obtain BMP/CBC in one week 4. Repeat CXR as an outpatient 5. Follow up on results of Blood Cultures that were drawn in the hospital.  Home Health: No Equipment/Devices: None  Discharge Condition: Stable CODE STATUS: FULL CODE Diet recommendation: Heart Healthy / Carb Modified / Regular / Dysphagia   Brief/Interim Summary: Katherine Cooper Katherine Cooper a 76 y.o.femalewith a past medical history significant for moderate dementia, CKD IV baseline Cr 1.5, and HTNwho presented with leg swelling. Patient has dementia and is anunreliable historian. She has had leg swelling for many months which becameacutely worse recently. She saw her PCP who prescribed antibiotic pills, but the area ulcerated so her family brought her to the ER. She also endorsed SOB. In the ER, she was septic with tachycardia, leukocytosis. CXR demonstrated a possible left base pneumonia. UA suggested possible UTI. She had AKI. She was started on IVF and antibiotics. Duplex of the left lower extremity was negative for DVT. AKI is improving and sepsis physiology also improved. WBC remains elevated but trending down and Abx were adjusted. Patient underwent V/Q because of elevated D-Dimer and showed low probability for Pe. She is improving and at this time deemed medically stable to be transitioned to SNF where she will receive therapy. She will need to follow up with Cardiology for Diastolic Heart Failure as an outpatient.   Discharge Diagnoses:  Principal Problem:   Acute renal failure (ARF) (HCC) Active Problems:   Bilateral leg edema   Essential hypertension   CKD  (chronic kidney disease) stage 4, GFR 15-29 ml/min (HCC)   Alzheimer's dementia, late onset, with behavioral disturbance   Leukocytosis   Leg swelling   Normocytic anemia  Sepsis due to possible CAP, left lower extremity cellulitis or UTI - Sepsis physiology (tachycardia, leukocytosis) improving since admission; Mildly tachycardic and tachypenic however spiked a Fever of 102 yesterday AM. - D/C'd IVF at 50 mL/hr - Influenza Negative and Urine Cx Negative - Blood Cx x 2 show NGTD at 3 day; Repeat Blood Cx drawn as patient spiked a fever showed NGTD < 24 hours - D/C'd IV Ceftriaxone andAzthromycin for atypical coverage and started IV Vancomycin and IV Cefepime as fever worsened; Contiue with IV Vanc and Cefepime and change to po Levofloxacin 750 mg q48h - WBC went from 17.2 -> 19.2 -> 16.0 -> 14.3 - Repeat CBC at SNF - May obtain Respiratory Viral Panel if not improving  Acute kidney injury on CKD IV, - Baseline Cr 1.5-1.6 and 3.2 mg/dl on admission. Take BID naprosyn, trending down with IVF - Renal US: Medical renal disease -minimize nephrotoxins - Renally dose medications - BUN/Cr improved from 60/2.77 -> 39/1.98 -> 24/1.57 -> 18/1.43(admission Cr was 3.28) - D/C'd NS at 50 mL/hr - Repeat CMP at SNF  Cellulitis and leg swelling: -likely worse on the left due to cellulitis, but underlying problem is likely venous insufficiency given the lateral/posterior location her ulcer. Alternatively, perhaps this is gout flare. The degree of tenderness suggests gout.  - Lower Extremity Duplex negative (D-Dimer Elevated but unable to get CTA of Chest because of Cr) - Appreciate wound care assistance; Per [place] nurse she will provide Nursing with  conservative care orders for the bilateral LEs while she is here so that we may continue to observe for resolution of erythema and edema. Once discharged back to facility, would recommend continuing care with compression, such as with an [employer], with twice weekly initially and then once weekly changes. LE elevation and flotation of heels to avoid pressure injuries can also be included. - Unna boots - Antibiotics changed to Vanc and Cefepime and will switch to po Levofloxacin - ECHO showed Systolic Fxn of [id number] with Grade 1 Diastolic Dysfunction; Moderate Regurge and PA pressure was 56 which may be contributing to leg swelling - Strict I's and O's - Cardiology follow up Diastolic Dysfunction evaluation  Elevated D-Dimer -V/Q Scan showed No ventilation-perfusion mismatch to suggest pulmonary embolism is noted. -Has mild Sinus Tachycardia and likely 2/2 to Infection  -Lower Extremity Duplex Negative  ? UTI; less likely as Urine had 6-30 Epithelial Cells -Urine Cx showed NGTD -Changed Empiric IV Ceftriaxone for CAP to Vanc and Cefepime and start po Levofloxacin in AM  NormocyticAnemia,  - Hemoglobin trending down and went from 9.1 -> 7.1 ?Dilutional) - Iron studies, B12, folate - TSH pending - Occult stool ordered - Repeat CBC at SNF  Hypertension: -Resume Home Antihypertensives at D/C -Continue statin, aspirin  Dementia: -Continue Memantine XR, Aricept 10 mg po Daily, BZD, Seroquel 25 mg po qHS  Anxiety: -C/w Tranxene 7.5 mg po TID  Hx of CVA -C/w ASA and Statin  Other medications: -Continue Norco PRN  Discharge Instructions  Discharge Instructions    Call MD for:  difficulty breathing, headache or visual disturbances    Complete by:  As directed    Call MD for:  persistant dizziness or light-headedness    Complete by:  As directed    Call MD for:  persistant nausea and vomiting    Complete by:  As directed    Call MD for:  severe uncontrolled pain    Complete by:  As directed    Call MD for:  temperature >100.4    Complete by:  As directed    Diet - low sodium heart healthy    Complete by:  As directed    Discharge instructions    Complete by:  As directed    Follow up Care at  SNF. Will need Cardiology Follow Up at Discharge. Take all medications as prescribed and have bloodwork rechecked at SNF. If symptoms worsen or change please return to ED for evaluation. Will need Wound Care at facility.   Increase activity slowly    Complete by:  As directed      Allergies as of 02/09/2017      Reactions   Sulfa Antibiotics Itching      Medication List    TAKE these medications   acetaminophen 500 MG tablet Commonly known as:  TYLENOL Take 500 mg by mouth every 8 (eight) hours as needed for mild pain.   aspirin EC 81 MG tablet Take 81 mg by mouth daily.   carvedilol 3.125 MG tablet Commonly known as:  COREG Take 3.125 mg by mouth 2 (two) times daily with a meal.   cetirizine 10 MG tablet Commonly known as:  ZYRTEC Take 10 mg by mouth daily.   clorazepate 7.5 MG tablet Commonly known as:  TRANXENE Take 7.5 mg by mouth 3 (three) times daily.   donepezil 10 MG tablet Commonly known as:  ARICEPT Take 10 mg by mouth at bedtime.   fluticasone 50 MCG/ACT nasal  spray Commonly known as:  FLONASE Place 2 sprays into both nostrils daily.   furosemide 40 MG tablet Commonly known as:  LASIX Take 40 mg by mouth daily.   HYDROcodone-acetaminophen 5-325 MG tablet Commonly known as:  NORCO/VICODIN Take 1 tablet by mouth every 8 (eight) hours as needed for moderate pain. What changed:  when to take this  reasons to take this   levofloxacin 750 MG tablet Commonly known as:  LEVAQUIN Take 1 tablet (750 mg total) by mouth every other day. Start taking on:  02/10/2017   multivitamin with minerals Tabs tablet Take 1 tablet by mouth daily.   NAMENDA XR 28 MG Cp24 24 hr capsule Generic drug:  memantine Take 1 capsule by mouth every morning.   oxybutynin 5 MG 24 hr tablet Commonly known as:  DITROPAN-XL Take 5 mg by mouth daily.   pantoprazole 40 MG tablet Commonly known as:  PROTONIX Take 40 mg by mouth daily.   QUEtiapine 25 MG tablet Commonly known  as:  SEROQUEL Take 25 mg by mouth at bedtime.   simvastatin 10 MG tablet Commonly known as:  ZOCOR Take 10 mg by mouth at bedtime.   triamterene-hydrochlorothiazide 37.5-25 MG tablet Commonly known as:  MAXZIDE-25 Take 1 tablet by mouth daily.       Contact information for follow-up providers    Philis Fendt, MD Follow up.   Specialty:  Internal Medicine Why:  Once Patient Leaves SNF Contact information: [place] [address] [phone]        [facility]. Call.   Why:  Will need follow up for Diastolic CHF as an outpatient.  Contact information: [place] [phone]           Contact information for after-discharge care    [facility] Follow up.   Specialty:  Skilled Nursing Facility Contact information: [address] [address] [phone]                 Allergies  Allergen Reactions  . Sulfa Antibiotics Itching   Consultations: None  Procedures/Studies: Dg Chest 2 View  Result Date: 02/05/2017 CLINICAL DATA:  Shortness breath and nausea for 1 month. History of bronchitis, hypertension, heart murmur. Former smoker. EXAM: CHEST  2 VIEW COMPARISON:  02/22/2016 FINDINGS: Shallow inspiration with mild linear atelectasis in the lung bases. Cardiac enlargement without vascular congestion. Focal increased density in the left lung base posteriorly suggesting focal pneumonia. Tortuous and dilated aorta. No blunting of costophrenic angles. No pneumothorax. IMPRESSION: Focal increased density in the left lung base posteriorly suggests focal pneumonia. Shallow inspiration. Dilated and tortuous aorta. Electronically Signed   By: Lucienne Capers M.D.   On: 02/05/2017 21:08   US Renal  Result Date: 02/06/2017 CLINICAL DATA:  Increased BUN creatinine, chronic kidney disease,  hypertension EXAM: RENAL / URINARY TRACT ULTRASOUND COMPLETE COMPARISON:  11/13/2011 FINDINGS: Right Kidney: Length: 11 cm. Echogenicity within normal limits. No mass or hydronephrosis visualized. Left Kidney: Length: 10.5 cm. Echogenicity within normal limits. No mass or hydronephrosis visualized. Mild bilateral renal cortical thinning probable due to atrophy. Bladder: Appears normal for degree of bladder distention. IMPRESSION: No hydronephrosis or renal calculi. Mild bilateral renal cortical thinning probable due to mild atrophy. Unremarkable urinary bladder. Electronically Signed   By: Lahoma Crocker M.D.   On: 02/06/2017 10:00   Nm Pulmonary Perf And Vent  Result Date:  02/08/2017 CLINICAL DATA:  Shortness of breath on exertion EXAM: NUCLEAR MEDICINE VENTILATION - PERFUSION LUNG SCAN TECHNIQUE: Ventilation images were obtained in multiple projections using inhaled aerosol Tc-15m DTPA. Perfusion images were obtained in multiple projections after intravenous injection of Tc-74m MAA. RADIOPHARMACEUTICALS:  31.2 mCi Technetium-27m DTPA aerosol inhalation and 4.1 mCi Technetium-65m MAA IV COMPARISON:  02/05/2017 FINDINGS: Ventilation: No focal ventilation defect. Perfusion: No wedge shaped peripheral perfusion defects to suggest acute pulmonary embolism. IMPRESSION: No ventilation-perfusion mismatch to suggest pulmonary embolism is noted. Electronically Signed   By: Inez Catalina M.D.   On: 02/08/2017 14:15   ECHOCardiogram Study Conclusions  - Left ventricle: The cavity size was normal. Systolic function was   normal. The estimated ejection fraction was in the range of 60%   to 65%. Wall motion was normal; there were no regional wall   motion abnormalities. Doppler parameters are consistent with   abnormal left ventricular relaxation (grade 1 diastolic   dysfunction). Doppler parameters are consistent with high   ventricular filling pressure. - Aortic valve: There was very mild stenosis. There was  moderate   regurgitation. Peak velocity (S): 235 cm/s. Mean gradient (S): 11   mm Hg. Valve area (VTI): 2.92 cm^2. Valve area (Vmax): 3.07 cm^2.   Valve area (Vmean): 3.28 cm^2. - Mitral valve: Transvalvular velocity was within the normal range.   There was no evidence for stenosis. There was no regurgitation. - Right ventricle: The cavity size was normal. Wall thickness was   normal. Systolic function was normal. - Atrial septum: No defect or patent foramen ovale was identified   by color flow Doppler. - Tricuspid valve: There was mild-moderate regurgitation. - Pulmonary arteries: Systolic pressure was moderately increased.   PA peak pressure: 56 mm Hg (S).  Subjective: Seen and examined and was doing better today and felt better. No Nausea or Vomiting still had pain and pain in Lower Extermities. No other concerns or complaints and updated daughter about D/C.  Discharge Exam: Vitals:   02/09/17 1000 02/09/17 1303  BP: 122/74 (!) 143/92  Pulse: 90 (!) 101  Resp: (!) 27 20  Temp: 97.6 F (36.4 C) 99.4 F (37.4 C)   Vitals:   02/08/17 1514 02/08/17 2029 02/09/17 1000 02/09/17 1303  BP:  132/80 122/74 (!) 143/92  Pulse:  100 90 (!) 101  Resp:  20 (!) 27 20  Temp: (!) 100.4 F (38 C) 98.8 F (37.1 C) 97.6 F (36.4 C) 99.4 F (37.4 C)  TempSrc: Oral Oral Oral Oral  SpO2:  100% 100% 98%  Weight:  97.8 kg (215 lb 9.8 oz)     General: Pt is alert, awake, not in acute distress Cardiovascular: RRR, S1/S2 +, no rubs, no gallops Respiratory: Diminished bilaterally, no wheezing, no rhonchi; Patient not tachypenic or using any accessory muscles to breathe Abdominal: Soft, NT, ND, bowel sounds + Extremities: Mild Lower edema; Legs wrapped in unna boots so unable to view wound but per nurse was still bleeding slightly.  The results of significant diagnostics from this hospitalization (including imaging, microbiology, ancillary and laboratory) are listed below for reference.     Microbiology: Recent Results (from the past 240 hour(s))  Blood Culture (routine x 2)     Status: None (Preliminary result)   Collection Time: 02/05/17 11:19 PM  Result Value Ref Range Status   Specimen Description BLOOD RIGHT ANTECUBITAL  Final   Special Requests BOTTLES DRAWN AEROBIC AND ANAEROBIC 5 CC  Final   Culture   Final  NO GROWTH 3 DAYS Performed at [facility], [place] [address] [place], [place] [address]    Report Status PENDING  Incomplete  Urine culture     Status: None   Collection Time: 02/05/17 11:19 PM  Result Value Ref Range Status   Specimen Description URINE, CLEAN CATCH  Final   Special Requests NONE  Final   Culture   Final    NO GROWTH Performed at [facility], [address] [place], [place] [address]    Report Status 02/07/2017 FINAL  Final  Blood Culture (routine x 2)     Status: None (Preliminary result)   Collection Time: 02/05/17 11:20 PM  Result Value Ref Range Status   Specimen Description BLOOD LEFT ANTECUBITAL  Final   Special Requests BOTTLES DRAWN AEROBIC AND ANAEROBIC 5 CC  Final   Culture   Final    NO GROWTH 3 DAYS Performed at [facility], [place] [address], [place], [place] [address]    Report Status PENDING  Incomplete  Culture, blood (Routine X 2) w Reflex to ID Panel     Status: None (Preliminary result)   Collection Time: 02/08/17  4:06 PM  Result Value Ref Range Status   Specimen Description BLOOD RIGHT ARM  Final   Special Requests BOTTLES DRAWN AEROBIC AND ANAEROBIC 5 CC EA  Final   Culture   Final    NO GROWTH < 24 HOURS Performed at [facility], [place] [address] [place], [place] [address]    Report Status PENDING  Incomplete  Culture, blood (Routine X 2) w Reflex to ID Panel     Status: None (Preliminary result)   Collection Time: 02/08/17  4:25 PM  Result Value Ref Range Status   Specimen Description BLOOD RIGHT ARM  Final   Special Requests BOTTLES DRAWN AEROBIC AND ANAEROBIC 5 CC  Final    Culture   Final    NO GROWTH < 24 HOURS Performed at [facility], [place] [address], [place],  [address]    Report Status PENDING  Incomplete    Labs: BNP (last 3 results)  Recent Labs  02/05/17 2055  BNP [id number]   Basic Metabolic Panel:  Recent Labs Lab 02/05/17 2135 02/06/17 0542 02/07/17 0554 02/08/17 0514 02/09/17 0524  NA 136 139 139 140 139  K 4.9 5.0 4.8 4.5 4.4  CL 107 110 111 113* 112*  CO2 19* 21* 21* 22 21*  GLUCOSE 136* 132* 98 99 100*  BUN 68* 60* 39* 24* 18  CREATININE 3.28* 2.77* 1.98* 1.57* 1.43*  CALCIUM 10.9* 10.4* 10.3 10.3 10.5*  MG  --   --   --  1.9 1.8  PHOS  --   --   --  2.6 2.8   Liver Function Tests:  Recent Labs Lab 02/08/17 0514 02/09/17 0524  AST 19 18  ALT 18 17  ALKPHOS 137* 127*  BILITOT 0.9 0.6  PROT 6.8 6.3*  ALBUMIN 2.1* 1.9*   No results for input(s): LIPASE, AMYLASE in the last 168 hours. No results for input(s): AMMONIA in the last 168 hours. CBC:  Recent Labs Lab 02/05/17 2055 02/06/17 0542 02/07/17 0554 02/08/17 0514 02/09/17 0524  WBC 19.8* 17.2* 19.2* 16.0* 14.3*  NEUTROABS 16.8*  --   --  13.0* 11.4*  HGB 9.1* 8.0* 7.6* 7.7* 7.1*  HCT 28.9* 25.9* 24.7* 24.5* 23.1*  MCV 87.3 85.2 87.9 86.0 85.6  PLT 430* 357 330 336 296  Cardiac Enzymes: No results for input(s): CKTOTAL, CKMB, CKMBINDEX, TROPONINI in the last 168 hours. BNP: Invalid input(s): POCBNP CBG: No results for input(s): GLUCAP in the last 168 hours. D-Dimer No results for input(s): DDIMER in the last 72 hours. Hgb A1c No results for input(s): HGBA1C in the last 72 hours. Lipid Profile No results for input(s): CHOL, HDL, LDLCALC, TRIG, CHOLHDL, LDLDIRECT in the last 72 hours. Thyroid function studies  Recent Labs  02/08/17 0514  TSH 1.349   Anemia work up No results for input(s): VITAMINB12, FOLATE, FERRITIN, TIBC, IRON, RETICCTPCT in the last 72 hours. Urinalysis    Component Value Date/Time   COLORURINE YELLOW  02/05/2017 2040   APPEARANCEUR CLEAR 02/05/2017 2040   LABSPEC 1.016 02/05/2017 2040   PHURINE 5.0 02/05/2017 2040   GLUCOSEU NEGATIVE 02/05/2017 2040   HGBUR NEGATIVE 02/05/2017 2040   BILIRUBINUR NEGATIVE 02/05/2017 2040   KETONESUR NEGATIVE 02/05/2017 2040   PROTEINUR 30 (A) 02/05/2017 2040   UROBILINOGEN 0.2 04/29/2015 0628   NITRITE NEGATIVE 02/05/2017 2040   LEUKOCYTESUR LARGE (A) 02/05/2017 2040   Sepsis Labs Invalid input(s): PROCALCITONIN,  WBC,  LACTICIDVEN Microbiology Recent Results (from the past 240 hour(s))  Blood Culture (routine x 2)     Status: None (Preliminary result)   Collection Time: 02/05/17 11:19 PM  Result Value Ref Range Status   Specimen Description BLOOD RIGHT ANTECUBITAL  Final   Special Requests BOTTLES DRAWN AEROBIC AND ANAEROBIC 5 CC  Final   Culture   Final    NO GROWTH 3 DAYS Performed at [facility], [place] [address] [place], [place] [address]    Report Status PENDING  Incomplete  Urine culture     Status: None   Collection Time: 02/05/17 11:19 PM  Result Value Ref Range Status   Specimen Description URINE, CLEAN CATCH  Final   Special Requests NONE  Final   Culture   Final    NO GROWTH Performed at [facility], [address] [place], [place] [address]    Report Status 02/07/2017 FINAL  Final  Blood Culture (routine x 2)     Status: None (Preliminary result)   Collection Time: 02/05/17 11:20 PM  Result Value Ref Range Status   Specimen Description BLOOD LEFT ANTECUBITAL  Final   Special Requests BOTTLES DRAWN AEROBIC AND ANAEROBIC 5 CC  Final   Culture   Final    NO GROWTH 3 DAYS Performed at [facility], [place] [address], [place], [place] [address]    Report Status PENDING  Incomplete  Culture, blood (Routine X 2) w Reflex to ID Panel     Status: None (Preliminary result)   Collection Time: 02/08/17  4:06 PM  Result Value Ref Range Status   Specimen Description BLOOD RIGHT ARM  Final   Special Requests BOTTLES  DRAWN AEROBIC AND ANAEROBIC 5 CC EA  Final   Culture   Final    NO GROWTH < 24 HOURS Performed at [facility], [place] [address], [place], [place] [address]    Report Status PENDING  Incomplete  Culture, blood (Routine X 2) w Reflex to ID Panel     Status: None (Preliminary result)   Collection Time: 02/08/17  4:25 PM  Result Value Ref Range Status   Specimen Description BLOOD RIGHT ARM  Final   Special Requests BOTTLES DRAWN AEROBIC AND ANAEROBIC 5 CC  Final   Culture   Final    NO GROWTH < 24  HOURS Performed at [facility], [place] [address], [place], [place] [address]    Report Status PENDING  Incomplete   Time coordinating discharge: Over 30 minutes  SIGNED:  Kerney Elbe, DO [facility] 02/09/2017, 2:54 PM Pager [phone]  If 7PM-7AM, please contact night-coverage [email or web address] Password [email or web address]

## 2017-02-09 NOTE — Progress Notes (Signed)
[facility] scheduled for 5:00 pick up. D/C summary sent to facility.  Left voicemail for patient daughter.  Nurse given report number.  No other needs identified.

## 2017-02-11 LAB — CULTURE, BLOOD (ROUTINE X 2)
CULTURE: NO GROWTH
Culture: NO GROWTH

## 2017-02-12 DIAGNOSIS — I739 Peripheral vascular disease, unspecified: Secondary | ICD-10-CM | POA: Diagnosis not present

## 2017-02-12 DIAGNOSIS — R52 Pain, unspecified: Secondary | ICD-10-CM | POA: Diagnosis not present

## 2017-02-13 LAB — CULTURE, BLOOD (ROUTINE X 2)
CULTURE: NO GROWTH
Culture: NO GROWTH

## 2017-02-15 ENCOUNTER — Ambulatory Visit: Payer: Medicare Other | Admitting: Physician Assistant

## 2017-02-15 NOTE — Progress Notes (Deleted)
Cardiology Office Note    Date:  02/15/2017   ID:  Katherine Cooper, Katherine Cooper 04-27-1941, MRN [id number]  PCP:  Philis Fendt, MD  Cardiologist:  New  Chief Complaint: Hospital follow up for new finding of diastolic dysfucntion  History of Present Illness:   Katherine Cooper is a 76 y.o. 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  Patient was admitted to the hospital 02/05/17-02/09/17 for sepsis due to possible CPAP, left lower cellulitis or UTI. Patient initially presented for evaluation of leg swelling. Negative influenza and urine culture. Blood culture negative. Treated with IV antibiotics. Also had acute on chronic kidney disease. D-dimer was elevated. Unable to get CT of the chest due to elevated creatinine. VQ scan was normal. Lower extremity Doppler was negative. She was seen by wound care nurse. ECHO showed LV function  60-65% with Grade 1 Diastolic Dysfunction; Moderate Regurge and PA pressure was 56. ? LE due to CHF and send her for further evaluation.    Past Medical History:  Diagnosis Date  . Alzheimer disease   . Alzheimer's dementia, late onset, with behavioral disturbance 02/06/2017  . Anemia   . Angina   . Anxiety   . Arthritis   . Blood transfusion   . Breast cancer ([place]) 10/22/2012  . CAP (community acquired pneumonia)   . Chronic kidney disease   . DDD (degenerative disc disease), lumbar   . Dementia   . Dysrhythmia   . Gout   . Headache(784.0)   . Heart murmur   . Hypertension   . Hypothyroidism   . Obesity   . Recurrent upper respiratory infection (URI)   . Shortness of breath 05/26/2013  . Stroke [facility])     Past Surgical History:  Procedure Laterality Date  . UTERINE FIBROID SURGERY      Current Medications: Prior to Admission medications   Medication Sig Start Date End Date Taking? Authorizing Provider  acetaminophen (TYLENOL) 500 MG tablet Take 500 mg by  mouth every 8 (eight) hours as needed for mild pain.    Historical Provider, MD  aspirin EC 81 MG tablet Take 81 mg by mouth daily.    Historical Provider, MD  carvedilol (COREG) 3.125 MG tablet Take 3.125 mg by mouth 2 (two) times daily with a meal.    Historical Provider, MD  cetirizine (ZYRTEC) 10 MG tablet Take 10 mg by mouth daily.     Historical Provider, MD  clorazepate (TRANXENE) 7.5 MG tablet Take 7.5 mg by mouth 3 (three) times daily.     Historical Provider, MD  donepezil (ARICEPT) 10 MG tablet Take 10 mg by mouth at bedtime.     Historical Provider, MD  fluticasone (FLONASE) 50 MCG/ACT nasal spray Place 2 sprays into both nostrils daily.     Historical Provider, MD  furosemide (LASIX) 40 MG tablet Take 40 mg by mouth daily.    Historical Provider, MD  HYDROcodone-acetaminophen (NORCO/VICODIN) 5-325 MG tablet Take 1 tablet by mouth every 8 (eight) hours as needed for moderate pain. 02/09/17   [place], DO  levofloxacin (LEVAQUIN) 750 MG tablet Take 1 tablet (750 mg total) by mouth every other day. 02/10/17   Kerney Elbe, DO  Memantine HCl ER (NAMENDA XR) 28 MG CP24 Take 1 capsule by mouth every morning.     Historical Provider, MD  Multiple Vitamin (MULTIVITAMIN WITH MINERALS) TABS tablet Take 1 tablet  by mouth daily.    Historical Provider, MD  oxybutynin (DITROPAN-XL) 5 MG 24 hr tablet Take 5 mg by mouth daily. 02/02/17   Historical Provider, MD  pantoprazole (PROTONIX) 40 MG tablet Take 40 mg by mouth daily.     Historical Provider, MD  QUEtiapine (SEROQUEL) 25 MG tablet Take 25 mg by mouth at bedtime.    Historical Provider, MD  simvastatin (ZOCOR) 10 MG tablet Take 10 mg by mouth at bedtime.    Historical Provider, MD  triamterene-hydrochlorothiazide (MAXZIDE-25) 37.5-25 MG tablet Take 1 tablet by mouth daily.    Historical Provider, MD    Allergies:   Sulfa antibiotics   Social History   Social History  . Marital status: Widowed    Spouse name: N/A  . Number of  children: N/A  . Years of education: N/A   Social History Main Topics  . Smoking status: Former Smoker    Packs/day: 0.25    Types: Cigarettes    Quit date: 03/13/2012  . Smokeless tobacco: Never Used  . Alcohol use No  . Drug use: No  . Sexual activity: No   Other Topics Concern  . Not on file   Social History Narrative  . No narrative on file     Family History:  The patient's family history includes Asthma in her mother; Heart attack in her father; Kidney failure in her brother. ***  ROS:   Please see the history of present illness.    ROS All other systems reviewed and are negative.   PHYSICAL EXAM:   VS:  There were no vitals taken for this visit.   GEN: Well nourished, well developed, in no acute distress  HEENT: normal  Neck: no JVD, carotid bruits, or masses Cardiac: ***RRR; no murmurs, rubs, or gallops,no edema  Respiratory:  clear to auscultation bilaterally, normal work of breathing GI: soft, nontender, nondistended, + BS MS: no deformity or atrophy  Skin: warm and dry, no rash Neuro:  Alert and Oriented x 3, Strength and sensation are intact Psych: euthymic mood, full affect  Wt Readings from Last 3 Encounters:  02/08/17 215 lb 9.8 oz (97.8 kg)  09/21/16 230 lb (104.3 kg)  02/22/16 215 lb (97.5 kg)      Studies/Labs Reviewed:   EKG:  EKG is ordered today.  The ekg ordered today demonstrates ***  Recent Labs: 02/05/2017: B Natriuretic Peptide 54.6 02/08/2017: TSH 1.349 02/09/2017: ALT 17; BUN 18; Creatinine, Ser 1.43; Hemoglobin 7.1; Magnesium 1.8; Platelets 296; Potassium 4.4; Sodium 139   Lipid Panel    Component Value Date/Time   CHOL 110 11/13/2011 0553   TRIG 97 11/13/2011 0553   HDL 43 11/13/2011 0553   CHOLHDL 2.6 11/13/2011 0553   VLDL 19 11/13/2011 0553   LDLCALC 48 11/13/2011 0553    Additional studies/ records that were reviewed today include:   Echocardiogram: 02/07/17 Study Conclusions  - Left ventricle: The cavity size was  normal. Systolic function was   normal. The estimated ejection fraction was in the range of 60%   to 65%. Wall motion was normal; there were no regional wall   motion abnormalities. Doppler parameters are consistent with   abnormal left ventricular relaxation (grade 1 diastolic   dysfunction). Doppler parameters are consistent with high   ventricular filling pressure. - Aortic valve: There was very mild stenosis. There was moderate   regurgitation. Peak velocity (S): 235 cm/s. Mean gradient (S): 11   mm Hg. Valve area (VTI): 2.92 cm^2. Valve  area (Vmax): 3.07 cm^2.   Valve area (Vmean): 3.28 cm^2. - Mitral valve: Transvalvular velocity was within the normal range.   There was no evidence for stenosis. There was no regurgitation. - Right ventricle: The cavity size was normal. Wall thickness was   normal. Systolic function was normal. - Atrial septum: No defect or patent foramen ovale was identified   by color flow Doppler. - Tricuspid valve: There was mild-moderate regurgitation. - Pulmonary arteries: Systolic pressure was moderately increased.   PA peak pressure: 56 mm Hg (S).  LE doppler 02/06/17 Summary:  - No evidence of deep vein thrombosis involving the visualized   veins of the left lower extremity. - . Incidental findings are consistent with: enlarged lymph node on   the left. - No evidence of Baker&'s cyst on the left.  Renal US 02/06/17 IMPRESSION: No hydronephrosis or renal calculi. Mild bilateral renal cortical thinning probable due to mild atrophy. Unremarkable urinary bladder.    ASSESSMENT & PLAN:    1. ***    Medication Adjustments/Labs and Tests Ordered: Current medicines are reviewed at length with the patient today.  Concerns regarding medicines are outlined above.  Medication changes, Labs and Tests ordered today are listed in the Patient Instructions below. There are no Patient Instructions on file for this visit.   Jarrett Soho, [place]    02/15/2017 12:06 PM    [facility] [address], [place], [place]  [address] Phone: [phone]; Fax: [phone]

## 2017-02-16 DIAGNOSIS — L03115 Cellulitis of right lower limb: Secondary | ICD-10-CM | POA: Diagnosis not present

## 2017-02-16 DIAGNOSIS — G301 Alzheimer's disease with late onset: Secondary | ICD-10-CM | POA: Diagnosis not present

## 2017-02-16 DIAGNOSIS — I129 Hypertensive chronic kidney disease with stage 1 through stage 4 chronic kidney disease, or unspecified chronic kidney disease: Secondary | ICD-10-CM | POA: Diagnosis not present

## 2017-02-16 DIAGNOSIS — I10 Essential (primary) hypertension: Secondary | ICD-10-CM | POA: Diagnosis not present

## 2017-02-21 DIAGNOSIS — D649 Anemia, unspecified: Secondary | ICD-10-CM | POA: Diagnosis not present

## 2017-02-21 DIAGNOSIS — K5900 Constipation, unspecified: Secondary | ICD-10-CM | POA: Diagnosis not present

## 2017-02-21 DIAGNOSIS — R1084 Generalized abdominal pain: Secondary | ICD-10-CM | POA: Diagnosis not present

## 2017-02-21 DIAGNOSIS — R52 Pain, unspecified: Secondary | ICD-10-CM | POA: Diagnosis not present

## 2017-02-22 ENCOUNTER — Encounter: Payer: Self-pay | Admitting: Physician Assistant

## 2017-02-26 DIAGNOSIS — E860 Dehydration: Secondary | ICD-10-CM | POA: Diagnosis not present

## 2017-02-26 DIAGNOSIS — R5381 Other malaise: Secondary | ICD-10-CM | POA: Diagnosis not present

## 2017-02-26 DIAGNOSIS — R112 Nausea with vomiting, unspecified: Secondary | ICD-10-CM | POA: Diagnosis not present

## 2017-02-27 DIAGNOSIS — N184 Chronic kidney disease, stage 4 (severe): Secondary | ICD-10-CM | POA: Diagnosis not present

## 2017-02-27 DIAGNOSIS — G301 Alzheimer's disease with late onset: Secondary | ICD-10-CM | POA: Diagnosis not present

## 2017-02-27 DIAGNOSIS — R5383 Other fatigue: Secondary | ICD-10-CM | POA: Diagnosis not present

## 2017-02-28 DIAGNOSIS — N179 Acute kidney failure, unspecified: Secondary | ICD-10-CM | POA: Diagnosis not present

## 2017-02-28 DIAGNOSIS — E860 Dehydration: Secondary | ICD-10-CM | POA: Diagnosis not present

## 2017-02-28 DIAGNOSIS — R5383 Other fatigue: Secondary | ICD-10-CM | POA: Diagnosis not present

## 2017-02-28 DIAGNOSIS — N184 Chronic kidney disease, stage 4 (severe): Secondary | ICD-10-CM | POA: Diagnosis not present

## 2017-03-01 DIAGNOSIS — N179 Acute kidney failure, unspecified: Secondary | ICD-10-CM | POA: Diagnosis not present

## 2017-03-01 DIAGNOSIS — G301 Alzheimer's disease with late onset: Secondary | ICD-10-CM | POA: Diagnosis not present

## 2017-03-01 DIAGNOSIS — R1312 Dysphagia, oropharyngeal phase: Secondary | ICD-10-CM | POA: Diagnosis not present

## 2017-03-01 DIAGNOSIS — E860 Dehydration: Secondary | ICD-10-CM | POA: Diagnosis not present

## 2017-03-02 DIAGNOSIS — G301 Alzheimer's disease with late onset: Secondary | ICD-10-CM | POA: Diagnosis not present

## 2017-03-02 DIAGNOSIS — N179 Acute kidney failure, unspecified: Secondary | ICD-10-CM | POA: Diagnosis not present

## 2017-03-02 DIAGNOSIS — N184 Chronic kidney disease, stage 4 (severe): Secondary | ICD-10-CM | POA: Diagnosis not present

## 2017-03-02 DIAGNOSIS — E860 Dehydration: Secondary | ICD-10-CM | POA: Diagnosis not present

## 2017-03-08 DIAGNOSIS — M79671 Pain in right foot: Secondary | ICD-10-CM | POA: Diagnosis not present

## 2017-03-08 DIAGNOSIS — M109 Gout, unspecified: Secondary | ICD-10-CM | POA: Diagnosis not present

## 2017-03-08 DIAGNOSIS — M79672 Pain in left foot: Secondary | ICD-10-CM | POA: Diagnosis not present

## 2017-03-09 DIAGNOSIS — N184 Chronic kidney disease, stage 4 (severe): Secondary | ICD-10-CM | POA: Diagnosis not present

## 2017-03-09 DIAGNOSIS — E860 Dehydration: Secondary | ICD-10-CM | POA: Diagnosis not present

## 2017-03-09 DIAGNOSIS — R5383 Other fatigue: Secondary | ICD-10-CM | POA: Diagnosis not present

## 2017-03-12 DIAGNOSIS — G301 Alzheimer's disease with late onset: Secondary | ICD-10-CM | POA: Diagnosis not present

## 2017-03-12 DIAGNOSIS — K649 Unspecified hemorrhoids: Secondary | ICD-10-CM | POA: Diagnosis not present

## 2017-03-12 DIAGNOSIS — N184 Chronic kidney disease, stage 4 (severe): Secondary | ICD-10-CM | POA: Diagnosis not present

## 2017-03-12 DIAGNOSIS — E860 Dehydration: Secondary | ICD-10-CM | POA: Diagnosis not present

## 2017-03-13 DIAGNOSIS — N184 Chronic kidney disease, stage 4 (severe): Secondary | ICD-10-CM | POA: Diagnosis not present

## 2017-03-13 DIAGNOSIS — E860 Dehydration: Secondary | ICD-10-CM | POA: Diagnosis not present

## 2017-03-13 DIAGNOSIS — G301 Alzheimer's disease with late onset: Secondary | ICD-10-CM | POA: Diagnosis not present

## 2017-03-16 DIAGNOSIS — N184 Chronic kidney disease, stage 4 (severe): Secondary | ICD-10-CM | POA: Diagnosis not present

## 2017-03-16 DIAGNOSIS — G301 Alzheimer's disease with late onset: Secondary | ICD-10-CM | POA: Diagnosis not present

## 2017-03-16 DIAGNOSIS — I10 Essential (primary) hypertension: Secondary | ICD-10-CM | POA: Diagnosis not present

## 2017-03-16 DIAGNOSIS — E860 Dehydration: Secondary | ICD-10-CM | POA: Diagnosis not present

## 2017-03-19 DIAGNOSIS — E559 Vitamin D deficiency, unspecified: Secondary | ICD-10-CM | POA: Diagnosis not present

## 2017-03-19 DIAGNOSIS — E119 Type 2 diabetes mellitus without complications: Secondary | ICD-10-CM | POA: Diagnosis not present

## 2017-03-19 DIAGNOSIS — E039 Hypothyroidism, unspecified: Secondary | ICD-10-CM | POA: Diagnosis not present

## 2017-03-19 DIAGNOSIS — N189 Chronic kidney disease, unspecified: Secondary | ICD-10-CM | POA: Diagnosis not present

## 2017-03-21 DIAGNOSIS — R5381 Other malaise: Secondary | ICD-10-CM | POA: Diagnosis not present

## 2017-03-21 DIAGNOSIS — N390 Urinary tract infection, site not specified: Secondary | ICD-10-CM | POA: Diagnosis not present

## 2017-04-12 DIAGNOSIS — R5381 Other malaise: Secondary | ICD-10-CM | POA: Diagnosis not present

## 2017-04-12 DIAGNOSIS — R05 Cough: Secondary | ICD-10-CM | POA: Diagnosis not present

## 2017-04-12 DIAGNOSIS — M545 Low back pain: Secondary | ICD-10-CM | POA: Diagnosis not present

## 2017-04-12 DIAGNOSIS — R0989 Other specified symptoms and signs involving the circulatory and respiratory systems: Secondary | ICD-10-CM | POA: Diagnosis not present

## 2017-04-13 DIAGNOSIS — R5383 Other fatigue: Secondary | ICD-10-CM | POA: Diagnosis not present

## 2017-04-17 DIAGNOSIS — R509 Fever, unspecified: Secondary | ICD-10-CM | POA: Diagnosis not present

## 2017-04-17 DIAGNOSIS — M1990 Unspecified osteoarthritis, unspecified site: Secondary | ICD-10-CM | POA: Diagnosis not present

## 2017-04-18 DIAGNOSIS — R5383 Other fatigue: Secondary | ICD-10-CM | POA: Diagnosis not present

## 2017-04-19 DIAGNOSIS — R309 Painful micturition, unspecified: Secondary | ICD-10-CM | POA: Diagnosis not present

## 2017-04-19 DIAGNOSIS — R05 Cough: Secondary | ICD-10-CM | POA: Diagnosis not present

## 2017-04-19 DIAGNOSIS — R5381 Other malaise: Secondary | ICD-10-CM | POA: Diagnosis not present

## 2017-05-03 DIAGNOSIS — I10 Essential (primary) hypertension: Secondary | ICD-10-CM | POA: Diagnosis not present

## 2017-05-03 DIAGNOSIS — N184 Chronic kidney disease, stage 4 (severe): Secondary | ICD-10-CM | POA: Diagnosis not present

## 2017-05-14 ENCOUNTER — Encounter (HOSPITAL_COMMUNITY): Payer: Self-pay

## 2017-05-14 ENCOUNTER — Emergency Department (HOSPITAL_COMMUNITY): Payer: Medicare Other

## 2017-05-14 ENCOUNTER — Inpatient Hospital Stay (HOSPITAL_COMMUNITY)
Admission: EM | Admit: 2017-05-14 | Discharge: 2017-05-24 | DRG: 177 | Disposition: A | Discharge status: Home / Self Care | Payer: Medicare Other | Attending: Internal Medicine | Admitting: Internal Medicine

## 2017-05-14 ENCOUNTER — Inpatient Hospital Stay (HOSPITAL_COMMUNITY): Payer: Medicare Other

## 2017-05-14 DIAGNOSIS — Z66 Do not resuscitate: Secondary | ICD-10-CM | POA: Diagnosis not present

## 2017-05-14 DIAGNOSIS — Z515 Encounter for palliative care: Secondary | ICD-10-CM

## 2017-05-14 DIAGNOSIS — M109 Gout, unspecified: Secondary | ICD-10-CM | POA: Diagnosis present

## 2017-05-14 DIAGNOSIS — E669 Obesity, unspecified: Secondary | ICD-10-CM | POA: Diagnosis present

## 2017-05-14 DIAGNOSIS — L89892 Pressure ulcer of other site, stage 2: Secondary | ICD-10-CM | POA: Diagnosis not present

## 2017-05-14 DIAGNOSIS — N184 Chronic kidney disease, stage 4 (severe): Secondary | ICD-10-CM | POA: Diagnosis not present

## 2017-05-14 DIAGNOSIS — F0281 Dementia in other diseases classified elsewhere with behavioral disturbance: Secondary | ICD-10-CM | POA: Diagnosis not present

## 2017-05-14 DIAGNOSIS — Z7189 Other specified counseling: Secondary | ICD-10-CM

## 2017-05-14 DIAGNOSIS — E872 Acidosis: Secondary | ICD-10-CM | POA: Diagnosis present

## 2017-05-14 DIAGNOSIS — Z1612 Extended spectrum beta lactamase (ESBL) resistance: Secondary | ICD-10-CM | POA: Diagnosis not present

## 2017-05-14 DIAGNOSIS — R630 Anorexia: Secondary | ICD-10-CM | POA: Diagnosis present

## 2017-05-14 DIAGNOSIS — Z993 Dependence on wheelchair: Secondary | ICD-10-CM

## 2017-05-14 DIAGNOSIS — G9340 Encephalopathy, unspecified: Secondary | ICD-10-CM | POA: Diagnosis present

## 2017-05-14 DIAGNOSIS — N179 Acute kidney failure, unspecified: Secondary | ICD-10-CM | POA: Diagnosis not present

## 2017-05-14 DIAGNOSIS — Z8249 Family history of ischemic heart disease and other diseases of the circulatory system: Secondary | ICD-10-CM

## 2017-05-14 DIAGNOSIS — R0902 Hypoxemia: Secondary | ICD-10-CM | POA: Diagnosis not present

## 2017-05-14 DIAGNOSIS — N189 Chronic kidney disease, unspecified: Secondary | ICD-10-CM | POA: Diagnosis not present

## 2017-05-14 DIAGNOSIS — E860 Dehydration: Secondary | ICD-10-CM | POA: Diagnosis present

## 2017-05-14 DIAGNOSIS — F329 Major depressive disorder, single episode, unspecified: Secondary | ICD-10-CM | POA: Diagnosis present

## 2017-05-14 DIAGNOSIS — F0280 Dementia in other diseases classified elsewhere without behavioral disturbance: Secondary | ICD-10-CM | POA: Diagnosis present

## 2017-05-14 DIAGNOSIS — J690 Pneumonitis due to inhalation of food and vomit: Principal | ICD-10-CM | POA: Diagnosis present

## 2017-05-14 DIAGNOSIS — I959 Hypotension, unspecified: Secondary | ICD-10-CM | POA: Diagnosis present

## 2017-05-14 DIAGNOSIS — G92 Toxic encephalopathy: Secondary | ICD-10-CM | POA: Diagnosis present

## 2017-05-14 DIAGNOSIS — Z7401 Bed confinement status: Secondary | ICD-10-CM

## 2017-05-14 DIAGNOSIS — R0602 Shortness of breath: Secondary | ICD-10-CM | POA: Diagnosis not present

## 2017-05-14 DIAGNOSIS — F02818 Dementia in other diseases classified elsewhere, unspecified severity, with other behavioral disturbance: Secondary | ICD-10-CM | POA: Diagnosis present

## 2017-05-14 DIAGNOSIS — R627 Adult failure to thrive: Secondary | ICD-10-CM | POA: Diagnosis present

## 2017-05-14 DIAGNOSIS — I10 Essential (primary) hypertension: Secondary | ICD-10-CM | POA: Diagnosis not present

## 2017-05-14 DIAGNOSIS — Z7982 Long term (current) use of aspirin: Secondary | ICD-10-CM

## 2017-05-14 DIAGNOSIS — K5900 Constipation, unspecified: Secondary | ICD-10-CM | POA: Diagnosis not present

## 2017-05-14 DIAGNOSIS — J9811 Atelectasis: Secondary | ICD-10-CM | POA: Diagnosis not present

## 2017-05-14 DIAGNOSIS — E876 Hypokalemia: Secondary | ICD-10-CM | POA: Diagnosis not present

## 2017-05-14 DIAGNOSIS — E870 Hyperosmolality and hypernatremia: Secondary | ICD-10-CM | POA: Diagnosis present

## 2017-05-14 DIAGNOSIS — E039 Hypothyroidism, unspecified: Secondary | ICD-10-CM | POA: Diagnosis present

## 2017-05-14 DIAGNOSIS — I129 Hypertensive chronic kidney disease with stage 1 through stage 4 chronic kidney disease, or unspecified chronic kidney disease: Secondary | ICD-10-CM | POA: Diagnosis present

## 2017-05-14 DIAGNOSIS — R5383 Other fatigue: Secondary | ICD-10-CM | POA: Diagnosis not present

## 2017-05-14 DIAGNOSIS — G301 Alzheimer's disease with late onset: Secondary | ICD-10-CM | POA: Diagnosis not present

## 2017-05-14 DIAGNOSIS — F419 Anxiety disorder, unspecified: Secondary | ICD-10-CM | POA: Diagnosis present

## 2017-05-14 DIAGNOSIS — B961 Klebsiella pneumoniae [K. pneumoniae] as the cause of diseases classified elsewhere: Secondary | ICD-10-CM | POA: Diagnosis present

## 2017-05-14 DIAGNOSIS — N390 Urinary tract infection, site not specified: Secondary | ICD-10-CM | POA: Diagnosis not present

## 2017-05-14 DIAGNOSIS — J189 Pneumonia, unspecified organism: Secondary | ICD-10-CM | POA: Diagnosis not present

## 2017-05-14 DIAGNOSIS — D649 Anemia, unspecified: Secondary | ICD-10-CM | POA: Diagnosis present

## 2017-05-14 DIAGNOSIS — M5136 Other intervertebral disc degeneration, lumbar region: Secondary | ICD-10-CM | POA: Diagnosis present

## 2017-05-14 DIAGNOSIS — Z79899 Other long term (current) drug therapy: Secondary | ICD-10-CM

## 2017-05-14 DIAGNOSIS — R402431 Glasgow coma scale score 3-8, in the field [EMT or ambulance]: Secondary | ICD-10-CM | POA: Diagnosis not present

## 2017-05-14 DIAGNOSIS — K219 Gastro-esophageal reflux disease without esophagitis: Secondary | ICD-10-CM | POA: Diagnosis not present

## 2017-05-14 DIAGNOSIS — L8990 Pressure ulcer of unspecified site, unspecified stage: Secondary | ICD-10-CM | POA: Insufficient documentation

## 2017-05-14 DIAGNOSIS — L89612 Pressure ulcer of right heel, stage 2: Secondary | ICD-10-CM | POA: Diagnosis present

## 2017-05-14 DIAGNOSIS — Z87891 Personal history of nicotine dependence: Secondary | ICD-10-CM

## 2017-05-14 DIAGNOSIS — Z6834 Body mass index (BMI) 34.0-34.9, adult: Secondary | ICD-10-CM

## 2017-05-14 DIAGNOSIS — Z8673 Personal history of transient ischemic attack (TIA), and cerebral infarction without residual deficits: Secondary | ICD-10-CM

## 2017-05-14 DIAGNOSIS — R739 Hyperglycemia, unspecified: Secondary | ICD-10-CM | POA: Diagnosis not present

## 2017-05-14 DIAGNOSIS — Z853 Personal history of malignant neoplasm of breast: Secondary | ICD-10-CM

## 2017-05-14 DIAGNOSIS — M25512 Pain in left shoulder: Secondary | ICD-10-CM | POA: Diagnosis not present

## 2017-05-14 DIAGNOSIS — Z789 Other specified health status: Secondary | ICD-10-CM

## 2017-05-14 LAB — COMPREHENSIVE METABOLIC PANEL
ALT: 14 U/L (ref 14–54)
AST: 21 U/L (ref 15–41)
Albumin: 2.4 g/dL — ABNORMAL LOW (ref 3.5–5.0)
Alkaline Phosphatase: 73 U/L (ref 38–126)
Anion gap: 12 (ref 5–15)
BUN: 132 mg/dL — AB (ref 6–20)
CO2: 18 mmol/L — AB (ref 22–32)
CREATININE: 2.74 mg/dL — AB (ref 0.44–1.00)
Calcium: 11.2 mg/dL — ABNORMAL HIGH (ref 8.9–10.3)
Chloride: 105 mmol/L (ref 101–111)
GFR calc Af Amer: 18 mL/min — ABNORMAL LOW (ref >60)
GFR calc non Af Amer: 16 mL/min — ABNORMAL LOW (ref >60)
Glucose, Bld: 117 mg/dL — ABNORMAL HIGH (ref 65–99)
POTASSIUM: 4.3 mmol/L (ref 3.5–5.1)
SODIUM: 135 mmol/L (ref 135–145)
Total Bilirubin: 0.7 mg/dL (ref 0.3–1.2)
Total Protein: 6.6 g/dL (ref 6.5–8.1)

## 2017-05-14 LAB — CBC WITH DIFFERENTIAL/PLATELET
BASOS ABS: 0 10*3/uL (ref 0.0–0.1)
BASOS PCT: 0 %
EOS ABS: 0.1 10*3/uL (ref 0.0–0.7)
Eosinophils Relative: 1 %
HCT: 36.1 % (ref 36.0–46.0)
Hemoglobin: 10.9 g/dL — ABNORMAL LOW (ref 12.0–15.0)
Lymphocytes Relative: 18 %
Lymphs Abs: 1.8 10*3/uL (ref 0.7–4.0)
MCH: 25 pg — ABNORMAL LOW (ref 26.0–34.0)
MCHC: 30.2 g/dL (ref 30.0–36.0)
MCV: 82.8 fL (ref 78.0–100.0)
Monocytes Absolute: 0.6 10*3/uL (ref 0.1–1.0)
Monocytes Relative: 7 %
Neutro Abs: 7.4 10*3/uL (ref 1.7–7.7)
Neutrophils Relative %: 74 %
PLATELETS: 344 10*3/uL (ref 150–400)
RBC: 4.36 MIL/uL (ref 3.87–5.11)
RDW: 17.2 % — AB (ref 11.5–15.5)
WBC: 9.9 10*3/uL (ref 4.0–10.5)

## 2017-05-14 LAB — I-STAT TROPONIN, ED: Troponin i, poc: 0 ng/mL (ref 0.00–0.08)

## 2017-05-14 LAB — PHOSPHORUS: PHOSPHORUS: 4.8 mg/dL — AB (ref 2.5–4.6)

## 2017-05-14 LAB — URINALYSIS, ROUTINE W REFLEX MICROSCOPIC
BILIRUBIN URINE: NEGATIVE
GLUCOSE, UA: NEGATIVE mg/dL
KETONES UR: NEGATIVE mg/dL
NITRITE: NEGATIVE
Protein, ur: 30 mg/dL — AB
Specific Gravity, Urine: 1.016 (ref 1.005–1.030)
Squamous Epithelial / LPF: NONE SEEN
pH: 5 (ref 5.0–8.0)

## 2017-05-14 LAB — MAGNESIUM: Magnesium: 3 mg/dL — ABNORMAL HIGH (ref 1.7–2.4)

## 2017-05-14 LAB — TSH: TSH: 1.589 u[IU]/mL (ref 0.350–4.500)

## 2017-05-14 LAB — I-STAT CG4 LACTIC ACID, ED: Lactic Acid, Venous: 1.18 mmol/L (ref 0.5–1.9)

## 2017-05-14 LAB — BRAIN NATRIURETIC PEPTIDE: B Natriuretic Peptide: 74.9 pg/mL (ref 0.0–100.0)

## 2017-05-14 LAB — VITAMIN B12: Vitamin B-12: 1318 pg/mL — ABNORMAL HIGH (ref 180–914)

## 2017-05-14 MED ORDER — SIMVASTATIN 10 MG PO TABS
10.0000 mg | ORAL_TABLET | Freq: Every day | ORAL | Status: DC
  Administered 2017-05-15 – 2017-05-18 (×4): 10 mg via ORAL
  Filled 2017-05-14 (×5): qty 1

## 2017-05-14 MED ORDER — SENNOSIDES-DOCUSATE SODIUM 8.6-50 MG PO TABS
2.0000 | ORAL_TABLET | Freq: Every day | ORAL | Status: DC
  Administered 2017-05-15: 2 via ORAL
  Filled 2017-05-14 (×2): qty 2

## 2017-05-14 MED ORDER — MEMANTINE HCL ER 28 MG PO CP24
28.0000 mg | ORAL_CAPSULE | ORAL | Status: DC
  Administered 2017-05-16 – 2017-05-24 (×8): 28 mg via ORAL
  Filled 2017-05-14 (×11): qty 1

## 2017-05-14 MED ORDER — SODIUM CHLORIDE 0.9 % IV BOLUS (SEPSIS)
1000.0000 mL | Freq: Once | INTRAVENOUS | Status: AC
  Administered 2017-05-14: 1000 mL via INTRAVENOUS

## 2017-05-14 MED ORDER — HEPARIN SODIUM (PORCINE) 5000 UNIT/ML IJ SOLN
5000.0000 [IU] | Freq: Three times a day (TID) | INTRAMUSCULAR | Status: DC
  Administered 2017-05-14 – 2017-05-22 (×22): 5000 [IU] via SUBCUTANEOUS
  Filled 2017-05-14 (×21): qty 1

## 2017-05-14 MED ORDER — ADULT MULTIVITAMIN W/MINERALS CH
1.0000 | ORAL_TABLET | Freq: Every day | ORAL | Status: DC
  Administered 2017-05-15 – 2017-05-24 (×8): 1 via ORAL
  Filled 2017-05-14 (×10): qty 1

## 2017-05-14 MED ORDER — FLUTICASONE PROPIONATE 50 MCG/ACT NA SUSP
2.0000 | Freq: Every day | NASAL | Status: DC
  Administered 2017-05-17 – 2017-05-24 (×6): 2 via NASAL
  Filled 2017-05-14: qty 16

## 2017-05-14 MED ORDER — PIPERACILLIN-TAZOBACTAM 3.375 G IVPB
3.3750 g | Freq: Three times a day (TID) | INTRAVENOUS | Status: DC
  Administered 2017-05-14 – 2017-05-16 (×5): 3.375 g via INTRAVENOUS
  Filled 2017-05-14 (×7): qty 50

## 2017-05-14 MED ORDER — CARVEDILOL 3.125 MG PO TABS
3.1250 mg | ORAL_TABLET | Freq: Two times a day (BID) | ORAL | Status: DC
  Administered 2017-05-15: 3.125 mg via ORAL
  Filled 2017-05-14 (×2): qty 1

## 2017-05-14 MED ORDER — ONDANSETRON HCL 4 MG/2ML IJ SOLN: 4.0000 mg | Freq: Four times a day (QID) | INTRAMUSCULAR | Status: DC | PRN

## 2017-05-14 MED ORDER — PANTOPRAZOLE SODIUM 40 MG PO TBEC
40.0000 mg | DELAYED_RELEASE_TABLET | Freq: Every day | ORAL | Status: DC
  Administered 2017-05-15 – 2017-05-24 (×8): 40 mg via ORAL
  Filled 2017-05-14 (×9): qty 1

## 2017-05-14 MED ORDER — HYDROCODONE-ACETAMINOPHEN 5-325 MG PO TABS
1.0000 | ORAL_TABLET | Freq: Two times a day (BID) | ORAL | Status: DC | PRN
  Administered 2017-05-15 – 2017-05-23 (×4): 1 via ORAL
  Filled 2017-05-14 (×4): qty 1

## 2017-05-14 MED ORDER — ACETAMINOPHEN 325 MG PO TABS
650.0000 mg | ORAL_TABLET | Freq: Four times a day (QID) | ORAL | Status: DC | PRN
  Administered 2017-05-20 – 2017-05-23 (×4): 650 mg via ORAL
  Filled 2017-05-14 (×4): qty 2

## 2017-05-14 MED ORDER — LORATADINE 10 MG PO TABS
10.0000 mg | ORAL_TABLET | Freq: Every day | ORAL | Status: DC
  Administered 2017-05-15 – 2017-05-24 (×8): 10 mg via ORAL
  Filled 2017-05-14 (×9): qty 1

## 2017-05-14 MED ORDER — DEXTROSE 5 % IV SOLN
1.0000 g | Freq: Once | INTRAVENOUS | Status: AC
  Administered 2017-05-14: 1 g via INTRAVENOUS
  Filled 2017-05-14: qty 10

## 2017-05-14 MED ORDER — DONEPEZIL HCL 10 MG PO TABS
10.0000 mg | ORAL_TABLET | Freq: Every day | ORAL | Status: DC
  Administered 2017-05-15 – 2017-05-23 (×8): 10 mg via ORAL
  Filled 2017-05-14 (×10): qty 1

## 2017-05-14 MED ORDER — ASPIRIN EC 81 MG PO TBEC
81.0000 mg | DELAYED_RELEASE_TABLET | Freq: Every day | ORAL | Status: DC
  Administered 2017-05-15 – 2017-05-24 (×8): 81 mg via ORAL
  Filled 2017-05-14 (×9): qty 1

## 2017-05-14 MED ORDER — ONDANSETRON HCL 4 MG PO TABS: 4.0000 mg | ORAL_TABLET | Freq: Four times a day (QID) | ORAL | Status: DC | PRN

## 2017-05-14 MED ORDER — PHENYLEPH-SHARK LIV OIL-MO-PET 0.25-3-14-71.9 % RE OINT: 1.0000 "application " | TOPICAL_OINTMENT | Freq: Two times a day (BID) | RECTAL | Status: DC | PRN

## 2017-05-14 MED ORDER — SODIUM CHLORIDE 0.9 % IV SOLN
INTRAVENOUS | Status: DC
  Administered 2017-05-14 – 2017-05-15 (×2): via INTRAVENOUS
  Administered 2017-05-16: 1000 mL via INTRAVENOUS
  Administered 2017-05-16: 04:00:00 via INTRAVENOUS
  Administered 2017-05-17 – 2017-05-18 (×2): 1000 mL via INTRAVENOUS

## 2017-05-14 MED ORDER — MIRTAZAPINE 15 MG PO TABS
15.0000 mg | ORAL_TABLET | Freq: Every day | ORAL | Status: DC
  Administered 2017-05-15 – 2017-05-23 (×8): 15 mg via ORAL
  Filled 2017-05-14 (×10): qty 1

## 2017-05-14 NOTE — ED Notes (Signed)
Patient transported to CT 

## 2017-05-14 NOTE — ED Triage Notes (Signed)
Pt brought in by EMS from [facility] for [facility] since 3 days ago. Per EMS pt mental status has declined the last 3 days. Per EMS pt had difficulty swallowing dinner yesterday evening. On presentation pt was hypotensive and oxygen sat was at 81%. NRB placed and pt oxygen sat at 100%. Pt responding to painful stimuli at this time.

## 2017-05-14 NOTE — H&P (Signed)
History and Physical    Katherine Cooper NOB:[id number] DOB: Jun 05, 1941 DOA: 05/14/2017  PCP: Nolene Ebbs, MD   I have briefly reviewed patients previous medical reports in [facility].  Patient coming from: SNF  Chief Complaint: worsening mentation, SOB  HPI: Katherine Cooper is a 76 y/o woman with hx of dementia, HTN, CVA, obesity, DDD, CKD stage 3 and GERD; who presented to ED from SNF due to worsening mentation and SOB. Patient at baseline is essentially bed-ridden and oriented X 2 intermittently. For the last 3 days she has experienced increase confusion/disorientation and anorexia. 24 hrs prior to admission patient also have an episode of choking while being fed as per SNF nursing staff. On day of admission patient was essentially unresponsive, with non understandable mumbling, soft BP and hypoxic. EMS was called and patient transported to ED for further evaluation and treatment.  ED course: In the ED, patient received IVF's, was placed on oxygen supplementation, UA done (suggesting UTI), CXR demonstrated LLL infiltrates and blood work showed acute on chronic renal failure. cx's take and antibiotics initiated. Lactic acid WNL.   Review of Systems:  Unable to be reviewed due to patient dementia. But per SNF report and EMS records, no other abnormality appreciated.  Past Medical History:  Diagnosis Date  . Alzheimer disease   . Alzheimer's dementia, late onset, with behavioral disturbance 02/06/2017  . Anemia   . Angina   . Anxiety   . Arthritis   . Blood transfusion   . Breast cancer ([place]) 10/22/2012  . CAP (community acquired pneumonia)   . Chronic kidney disease   . DDD (degenerative disc disease), lumbar   . Dementia   . Dysrhythmia   . Gout   . Headache(784.0)   . Heart murmur   . Hypertension   . Hypothyroidism   . Obesity   . Recurrent upper respiratory infection (URI)   . Shortness of breath 05/26/2013  . Stroke [facility])     Past Surgical History:    Procedure Laterality Date  . UTERINE FIBROID SURGERY      Social History  reports that she quit smoking about 5 years ago. Her smoking use included Cigarettes. She smoked 0.25 packs per day. No hx of alcohol or illicit drugs.  Allergies  Allergen Reactions  . Sulfa Antibiotics Itching    Family History  Problem Relation Age of Onset  . Asthma Mother   . Heart attack Father   . Kidney failure Brother      Prior to Admission medications   Medication Sig Start Date End Date Taking? Authorizing Provider  acetaminophen (TYLENOL) 325 MG tablet Take 650 mg by mouth every 8 (eight) hours. 0000, 0800, 1600   Yes [provider]  acetaminophen (TYLENOL) 500 MG tablet Take 500 mg by mouth every 8 (eight) hours as needed for mild pain.   Yes [provider]  aspirin EC 81 MG tablet Take 81 mg by mouth daily.   Yes [provider]  carvedilol (COREG) 3.125 MG tablet Take 3.125 mg by mouth 2 (two) times daily with a meal.   Yes [provider]  cetirizine (ZYRTEC) 10 MG tablet Take 10 mg by mouth daily.    Yes [provider]  clorazepate (TRANXENE) 7.5 MG tablet Take 7.5 mg by mouth 3 (three) times daily.    Yes [provider]  donepezil (ARICEPT) 10 MG tablet Take 10 mg by mouth at bedtime.    Yes [provider]  fluticasone (FLONASE) 50 MCG/ACT nasal spray Place 2 sprays into both nostrils daily.    Yes [provider]  furosemide (LASIX) 40 MG tablet Take 40 mg by mouth daily.   Yes [provider]  HYDROcodone-acetaminophen (NORCO/VICODIN) 5-325 MG tablet Take 1 tablet by mouth every 8 (eight) hours as needed for moderate pain. 02/09/17  Yes Sheikh, Omair Latif, DO  Memantine HCl ER (NAMENDA XR) 28 MG CP24 Take 1 capsule by mouth every morning.    Yes [provider]  mirtazapine (REMERON) 15 MG tablet Take 15 mg by mouth at bedtime.   Yes [provider]  Multiple Vitamin (MULTIVITAMIN WITH  MINERALS) TABS tablet Take 1 tablet by mouth daily.   Yes [provider]  oxybutynin (DITROPAN-XL) 5 MG 24 hr tablet Take 5 mg by mouth daily. 02/02/17  Yes [provider]  pantoprazole (PROTONIX) 40 MG tablet Take 40 mg by mouth daily.    Yes [provider]  phenylephrine-shark liver oil-mineral oil-petrolatum (PREPARATION H) 0.25-3-14-71.9 % rectal ointment Place 1 application rectally 2 (two) times daily as needed for hemorrhoids.   Yes [provider]  PROMETHAZINE HCL IJ Inject 12.5 mg into the muscle every 6 (six) hours as needed (nausea/vomiting).   Yes [provider]  senna-docusate (SENOKOT-S) 8.6-50 MG tablet Take 2 tablets by mouth at bedtime.   Yes [provider]  simvastatin (ZOCOR) 10 MG tablet Take 10 mg by mouth at bedtime.   Yes [provider]  triamterene-hydrochlorothiazide (MAXZIDE-25) 37.5-25 MG tablet Take 1 tablet by mouth daily.   Yes [provider]    Physical Exam: Vitals:   05/14/17 1730 05/14/17 1745 05/14/17 1800 05/14/17 1815  BP: 105/81 111/81 125/85 124/88  Pulse: 93 89 90 90  Resp: 15 17 18 17   Temp:      TempSrc:      SpO2: 99% 99% 99% 99%  Weight:      Height:          Constitutional: patient is afebrile, mild SOB and requiring oxygen supplementation (new for her); moving four limbs and oriented X 1. Intermittently follow simple commands. Eyes: PERTLA, lids and conjunctivae normal, no icterus. ENMT: Mucous membranes dry on exam, no thrush. Posterior pharynx clear of any exudate or lesions.  Neck: supple, no masses, no thyromegaly, no JVD Respiratory: fair air movement, no wheezing, positive rhonchi. No using accessory muscles. Positive tachypnea. Cardiovascular: S1 & S2 heard, regular rate; positive SEM, no rubs, no gallops. Abdomen: No distension, no tenderness, Soft, no guarding and with positive BS.  Musculoskeletal: no clubbing / cyanosis or edema. No contractures  appreciated; patient with signs of muscular atrophy on her LE. Skin: no rashes, lesions or ulcers seen on limited exam; patient with difficulty cooperating with exam and unable to turn on request to assess her back. Neurologic: cranial nerve grossly intact, moving four limbs spontaneously, intermittently following simple commands; patient with decreased strength on her legs bilaterally. Psychiatric: Oriented X1,por insight, no SI or hallucinations.  Labs on Admission: I have personally reviewed following labs and imaging studies  CBC:  Recent Labs Lab 05/14/17 1530  WBC 9.9  NEUTROABS 7.4  HGB 10.9*  HCT 36.1  MCV 82.8  PLT 025   Basic Metabolic Panel:  Recent Labs Lab 05/14/17 1530  NA 135  K 4.3  CL 105  CO2 18*  GLUCOSE 117*  BUN 132*  CREATININE 2.74*  CALCIUM 11.2*   Liver Function Tests:  Recent Labs Lab 05/14/17  1530  AST 21  ALT 14  ALKPHOS 73  BILITOT 0.7  PROT 6.6  ALBUMIN 2.4*   Urine analysis:    Component Value Date/Time   COLORURINE AMBER (A) 05/14/2017 1645   APPEARANCEUR CLOUDY (A) 05/14/2017 1645   LABSPEC 1.016 05/14/2017 1645   PHURINE 5.0 05/14/2017 1645   GLUCOSEU NEGATIVE 05/14/2017 1645   HGBUR SMALL (A) 05/14/2017 1645   BILIRUBINUR NEGATIVE 05/14/2017 [place] 05/14/2017 1645   PROTEINUR 30 (A) 05/14/2017 1645   UROBILINOGEN 0.2 04/29/2015 0628   NITRITE NEGATIVE 05/14/2017 1645   LEUKOCYTESUR LARGE (A) 05/14/2017 1645     Radiological Exams on Admission: Ct Head Wo Contrast  Result Date: 05/14/2017 CLINICAL DATA:  Altered mental status EXAM: CT HEAD WITHOUT CONTRAST TECHNIQUE: Contiguous axial images were obtained from the base of the skull through the vertex without intravenous contrast. COMPARISON:  09/04/2013 FINDINGS: Brain: No evidence of acute infarction, hemorrhage, hydrocephalus, extra-axial collection or mass lesion/mass effect. Mild atrophy. Mild periventricular and deep white matter small vessel  ischemic changes. Vascular: No hyperdense vessels.  Carotid artery calcifications. Skull: Normal. Negative for fracture or focal lesion. Sinuses/Orbits: Minimal mucosal thickening in the ethmoid sinuses. No acute orbital abnormality. Bilateral lens extraction. Other: None IMPRESSION: No CT evidence for acute intracranial abnormality. Electronically Signed   By: Donavan Foil M.D.   On: 05/14/2017 16:11   Dg Chest Portable 1 View  Result Date: 05/14/2017 CLINICAL DATA:  Altered mental status EXAM: PORTABLE CHEST 1 VIEW COMPARISON:  02/05/2017 FINDINGS: Low lung volumes with left basilar atelectasis. No focal consolidation or effusion. Stable enlarged cardiomediastinal silhouette with dilatation and tortuosity of the aortic knob. No pneumothorax. IMPRESSION: Low lung volumes with left basilar atelectasis. Electronically Signed   By: Donavan Foil M.D.   On: 05/14/2017 15:42    EKG: sinus rhythm, normal axis, normal rate; no acute ischemic changes appreciated.  Assessment/Plan 1-Acute encephalopathy: appears to be toxic due to aspiration PNA and UTI. Patient also with hypercalcemia, which can contribute to AMS. -admitted to med-surg -normal lactic acid, no elevated WBC's and afebrile; so no septic on admission.  -will provide IVF's resuscitation, IV antibiotics, PRN antiemetics/analgesics -continue oxygen supplementation  -follow blood cx, UTI and sputum cx's. -will ask for speech evaluation and mean while will use full liquid diet. -flutter valve and IS ordered -will follow clinical response   2-PNA/Pneumonitis: patient with LLL infiltrates, which is either atelectasis vs earlier PNA from aspiration. -will continue oxygen supplementation -continue abx's, using zosyn for now -provide supportive care and follow response  3-Obesity (BMI 30-39.9) -Body mass index is 34.33 kg/m. -healthy diet discussed with family  4-Essential hypertension -will monitor BP and orthostatic changes  -continue  IVF's for now and hold antihypertensive agents  5-CKD (chronic kidney disease) stage 4, GFR 15-29 ml/min ([place]): with Acute renal failure due to dehydration, infection and continue use of diuretics. -will hold nephrotoxic agents -provide IVF's -abx;s fo infection -follow renal function trend  6-Dehydration: with decrease skin turgor and lack of proper intake in 3 days -patient also on diuretics -will hold diuretics and provide IVF's  7-Alzheimer's dementia, late onset, with behavioral disturbance. -no agitation or anxiety currently -continue supportive care and continue namenda/aricept  8 Hypercalcemia -most likely associated to dehydration and use of HCTZ -will hold diuretics -provide IVF's -follow electrolytes trend  9-metabolic acidosis -mild CO2 18; most likely from worsening renal function  -will monitor electrolytes, provide IVF's and if needed will give bicarbonate.  Time:70 minutes   DVT prophylaxis: heparin  Code Status: Full Family Communication: daughters at bedside Disposition Plan: back to SNF once medically stable. Consults called: none Admission status: inpatient, med-surg; LOS > 2 midnights.   Barton Dubois MD [facility] Pager [phone]  If 7PM-7AM, please contact night-coverage [email or web address] Password [email or web address]  05/14/2017, 7:00 PM

## 2017-05-14 NOTE — ED Provider Notes (Signed)
[facility] DEPT Provider Note   CSN: [id number] Arrival date & time: 05/14/17  1511     History   Chief Complaint Chief Complaint  Patient presents with  . Altered Mental Status    HPI Katherine Cooper is a 76 y.o. female.  The history is provided by the nursing home, the EMS personnel and the patient. The history is limited by the condition of the patient.     Presents with EMS for concern for altered mental status at [facility] [place], patient not eating or drinking as much, trying to push fluids, has been sleepier for about 3 days. Taking medications Normally she is alert and oriented x1, can let needs be known, asks for water, wheelchair bound, doesn't help with transfers, no focal weakness.  Over the last few days has been sleepier, less interactive. No falls or trauma, not sure of left shoulder deformity if present or not, no fevers. Vital signs ok this morning but this afternoon when EMS arrived was hypotensive and hypoxic.  EMS reports blood pressure 32K systolic, 02% on room air.   Patient reports urinary symptoms, headache, neck pain. Unable to state for how long.     Past Medical History:  Diagnosis Date  . Alzheimer disease   . Alzheimer's dementia, late onset, with behavioral disturbance 02/06/2017  . Anemia   . Angina   . Anxiety   . Arthritis   . Blood transfusion   . Breast cancer ([place]) 10/22/2012  . CAP (community acquired pneumonia)   . Chronic kidney disease   . DDD (degenerative disc disease), lumbar   . Dementia   . Dysrhythmia   . Gout   . Headache(784.0)   . Heart murmur   . Hypertension   . Hypothyroidism   . Obesity   . Recurrent upper respiratory infection (URI)   . Shortness of breath 05/26/2013  . Stroke [facility])     Patient Active Problem List   Diagnosis Date Noted  . Acute encephalopathy 05/14/2017  . Alzheimer's dementia, late onset, with behavioral disturbance 02/06/2017  . Leukocytosis 02/06/2017  . Leg swelling  02/06/2017  . Normocytic anemia 02/06/2017  . Acute renal failure (ARF) ([place]) 02/05/2017  . Chest pain 10/12/2013  . Palpitations 10/12/2013  . Acute bronchitis 10/12/2013  . ARF (acute renal failure) ([place]) 10/12/2013  . Shortness of breath 05/26/2013  . Breast cancer ([place]) 10/22/2012  . Dehydration 07/22/2012  . Chronic pain 07/22/2012  . Anemia 07/22/2012  . Hypotension 07/21/2012  . Hip pain 07/21/2012  . Anxiety   . Elbow pain 07/09/2012  . PNA (pneumonia) 07/08/2012  . Hyperkalemia 07/08/2012  . Essential hypertension 07/08/2012  . CKD (chronic kidney disease) stage 4, GFR 15-29 ml/min (HCC) 07/08/2012  . Chest pain at rest 11/12/2011    Class: Acute  . Bilateral leg edema 11/12/2011    Class: Chronic  . Obesity (BMI 30-39.9) 11/12/2011    Class: Chronic  . Gout 11/12/2011    Class: Chronic    Past Surgical History:  Procedure Laterality Date  . UTERINE FIBROID SURGERY      OB History    No data available       Home Medications    Prior to Admission medications   Medication Sig Start Date End Date Taking? Authorizing Provider  acetaminophen (TYLENOL) 325 MG tablet Take 650 mg by mouth every 8 (eight) hours. 0000, 0800, 1600   Yes [provider]  acetaminophen (TYLENOL) 500 MG tablet Take 500 mg by  mouth every 8 (eight) hours as needed for mild pain.   Yes [provider]  aspirin EC 81 MG tablet Take 81 mg by mouth daily.   Yes [provider]  carvedilol (COREG) 3.125 MG tablet Take 3.125 mg by mouth 2 (two) times daily with a meal.   Yes [provider]  cetirizine (ZYRTEC) 10 MG tablet Take 10 mg by mouth daily.    Yes [provider]  clorazepate (TRANXENE) 7.5 MG tablet Take 7.5 mg by mouth 3 (three) times daily.    Yes [provider]  donepezil (ARICEPT) 10 MG tablet Take 10 mg by mouth at bedtime.    Yes [provider]  fluticasone (FLONASE) 50 MCG/ACT nasal spray Place 2 sprays into both  nostrils daily.    Yes [provider]  furosemide (LASIX) 40 MG tablet Take 40 mg by mouth daily.   Yes [provider]  HYDROcodone-acetaminophen (NORCO/VICODIN) 5-325 MG tablet Take 1 tablet by mouth every 8 (eight) hours as needed for moderate pain. 02/09/17  Yes Sheikh, Omair Latif, DO  Memantine HCl ER (NAMENDA XR) 28 MG CP24 Take 1 capsule by mouth every morning.    Yes [provider]  mirtazapine (REMERON) 15 MG tablet Take 15 mg by mouth at bedtime.   Yes [provider]  Multiple Vitamin (MULTIVITAMIN WITH MINERALS) TABS tablet Take 1 tablet by mouth daily.   Yes [provider]  oxybutynin (DITROPAN-XL) 5 MG 24 hr tablet Take 5 mg by mouth daily. 02/02/17  Yes [provider]  pantoprazole (PROTONIX) 40 MG tablet Take 40 mg by mouth daily.    Yes [provider]  phenylephrine-shark liver oil-mineral oil-petrolatum (PREPARATION H) 0.25-3-14-71.9 % rectal ointment Place 1 application rectally 2 (two) times daily as needed for hemorrhoids.   Yes [provider]  PROMETHAZINE HCL IJ Inject 12.5 mg into the muscle every 6 (six) hours as needed (nausea/vomiting).   Yes [provider]  senna-docusate (SENOKOT-S) 8.6-50 MG tablet Take 2 tablets by mouth at bedtime.   Yes [provider]  simvastatin (ZOCOR) 10 MG tablet Take 10 mg by mouth at bedtime.   Yes [provider]  triamterene-hydrochlorothiazide (MAXZIDE-25) 37.5-25 MG tablet Take 1 tablet by mouth daily.   Yes [provider]  levofloxacin (LEVAQUIN) 750 MG tablet Take 1 tablet (750 mg total) by mouth every other day. Patient not taking: Reported on 05/14/2017 02/10/17   Kerney Elbe, DO    Family History Family History  Problem Relation Age of Onset  . Asthma Mother   . Heart attack Father   . Kidney failure Brother     Social History Social History  Substance Use Topics  . Smoking status: Former Smoker     Packs/day: 0.25    Types: Cigarettes    Quit date: 03/13/2012  . Smokeless tobacco: Never Used  . Alcohol use No     Allergies   Sulfa antibiotics   Review of Systems Review of Systems  Unable to perform ROS: Dementia  Constitutional: Negative for fever.  Eyes: Negative for visual disturbance.  Respiratory: Negative for cough and shortness of breath.   Cardiovascular: Negative for chest pain.  Gastrointestinal: Negative for abdominal pain, nausea and vomiting.  Genitourinary: Positive for dysuria.  Musculoskeletal: Positive for neck pain. Negative for back pain.  Skin: Negative for rash.  Neurological: Positive for headaches. Negative for syncope.     Physical Exam Updated Vital Signs BP 114/87  Pulse 92   Temp 98.3 F (36.8 C) (Rectal)   Resp 17   Ht 5\' 4"  (1.626 m)   Wt 90.7 kg (200 lb)   SpO2 100%   BMI 34.33 kg/m   Physical Exam  Constitutional: She appears well-developed and well-nourished. She appears listless. No distress.  HENT:  Head: Normocephalic and atraumatic.  Eyes: Conjunctivae and EOM are normal.  Neck: Muscular tenderness (right sided, spasm) present.  Right sided paraspinal muscular tenderness, head turned to left, pain with movement of head to midline, does not appear to have midline tenderness  Cardiovascular: Normal rate, regular rhythm, normal heart sounds and intact distal pulses.  Exam reveals no gallop and no friction rub.   No murmur heard. Pulmonary/Chest: Effort normal and breath sounds normal. No respiratory distress. She has no wheezes. She has no rales.  Abdominal: Soft. She exhibits no distension. There is no tenderness. There is no guarding.  Musculoskeletal: She exhibits no edema.       Left shoulder: She exhibits tenderness and deformity.  Unclear if positional left shoulder deformity   Neurological: She appears listless.  Symmetric facies, midline tongue, able to move both upper and lower extremities but not reliably  following commands, generalized weakness.  Patient sleepy, intermittently follows commands  Skin: Skin is warm and dry. No rash noted. She is not diaphoretic. No erythema.  Nursing note and vitals reviewed.    ED Treatments / Results  Labs (all labs ordered are listed, but only abnormal results are displayed) Labs Reviewed  CBC WITH DIFFERENTIAL/PLATELET - Abnormal; Notable for the following:       Result Value   Hemoglobin 10.9 (*)    MCH 25.0 (*)    RDW 17.2 (*)    All other components within normal limits  COMPREHENSIVE METABOLIC PANEL - Abnormal; Notable for the following:    CO2 18 (*)    Glucose, Bld 117 (*)    BUN 132 (*)    Creatinine, Ser 2.74 (*)    Calcium 11.2 (*)    Albumin 2.4 (*)    GFR calc non Af Amer 16 (*)    GFR calc Af Amer 18 (*)    All other components within normal limits  URINALYSIS, ROUTINE W REFLEX MICROSCOPIC - Abnormal; Notable for the following:    Color, Urine AMBER (*)    APPearance CLOUDY (*)    Hgb urine dipstick SMALL (*)    Protein, ur 30 (*)    Leukocytes, UA LARGE (*)    Bacteria, UA MANY (*)    All other components within normal limits  URINE CULTURE  CULTURE, BLOOD (ROUTINE X 2)  CULTURE, BLOOD (ROUTINE X 2)  BRAIN NATRIURETIC PEPTIDE  I-STAT CG4 LACTIC ACID, ED  I-STAT TROPOININ, ED    EKG  EKG Interpretation  Date/Time:  Monday May 14 2017 15:14:48 EDT Ventricular Rate:  92 PR Interval:    QRS Duration: 98 QT Interval:  362 QTC Calculation: 448 R Axis:   -5 Text Interpretation:  Sinus rhythm Abnormal R-wave progression, early transition Inferior infarct, old No significant change since last tracing Confirmed by [facility] MD, [place] ([id number]) on 05/14/2017 5:10:57 PM       Radiology Ct Head Wo Contrast  Result Date: 05/14/2017 CLINICAL DATA:  Altered mental status EXAM: CT HEAD WITHOUT CONTRAST TECHNIQUE: Contiguous axial images were obtained from the base of the skull through the vertex without intravenous contrast.  COMPARISON:  09/04/2013 FINDINGS: Brain: No evidence of acute infarction, hemorrhage, hydrocephalus,  extra-axial collection or mass lesion/mass effect. Mild atrophy. Mild periventricular and deep white matter small vessel ischemic changes. Vascular: No hyperdense vessels.  Carotid artery calcifications. Skull: Normal. Negative for fracture or focal lesion. Sinuses/Orbits: Minimal mucosal thickening in the ethmoid sinuses. No acute orbital abnormality. Bilateral lens extraction. Other: None IMPRESSION: No CT evidence for acute intracranial abnormality. Electronically Signed   By: Donavan Foil M.D.   On: 05/14/2017 16:11   Dg Chest Portable 1 View  Result Date: 05/14/2017 CLINICAL DATA:  Altered mental status EXAM: PORTABLE CHEST 1 VIEW COMPARISON:  02/05/2017 FINDINGS: Low lung volumes with left basilar atelectasis. No focal consolidation or effusion. Stable enlarged cardiomediastinal silhouette with dilatation and tortuosity of the aortic knob. No pneumothorax. IMPRESSION: Low lung volumes with left basilar atelectasis. Electronically Signed   By: Donavan Foil M.D.   On: 05/14/2017 15:42    Procedures Procedures (including critical care time)  Medications Ordered in ED Medications  cefTRIAXone (ROCEPHIN) 1 g in dextrose 5 % 50 mL IVPB (1 g Intravenous New Bag/Given 05/14/17 1735)  sodium chloride 0.9 % bolus 1,000 mL (1,000 mLs Intravenous New Bag/Given 05/14/17 1736)  sodium chloride 0.9 % bolus 1,000 mL (1,000 mLs Intravenous New Bag/Given 05/14/17 1735)     Initial Impression / Assessment and Plan / ED Course  I have reviewed the triage vital signs and the nursing notes.  Pertinent labs & imaging results that were available during my care of the patient were reviewed by me and considered in my medical decision making (see chart for details).    76 year old female with a history of moderate dementia, hypertension, CKD who presents with concern for altered mental status from [facility] skilled  nursing facility. Per EMS, patient hypoxic and hypotensive on their arrival.  On arrival to the emergency department, blood pressures have improved following fluids with EMS, and her oxygen saturations are normal on 2 L per nasal cannula.  Given improved blood pressures, afebrile, no leukocytosis or source of infection, code sepsis not initiated on arrival.  CT head done given altered mental status and shows no acute findings. No history of trauma or falls to suggest cervical spine fracture, and feel that patient's neck pain is likely secondary to muscular spasm.  Her neurologic exam is limited by her ability to participate in the exam and follow commands, however appears nonfocal at this time and will continue to monitor as an inpatient.  Patient most likely with urinary tract infection and dehydration contributing to worsening mental status and acute kidney injury.  Given IV fluids, rocephin and will admit for continued care.   Final Clinical Impressions(s) / ED Diagnoses   Final diagnoses:  Hypoxia  Hypotension  Urinary tract infection without hematuria, site unspecified  AKI (acute kidney injury) ([place])  Dehydration    New Prescriptions New Prescriptions   No medications on file     Gareth Morgan, MD 05/14/17 [phone]

## 2017-05-14 NOTE — ED Notes (Signed)
Patient transported to X-ray 

## 2017-05-15 DIAGNOSIS — L8990 Pressure ulcer of unspecified site, unspecified stage: Secondary | ICD-10-CM | POA: Insufficient documentation

## 2017-05-15 DIAGNOSIS — J690 Pneumonitis due to inhalation of food and vomit: Principal | ICD-10-CM

## 2017-05-15 DIAGNOSIS — L89892 Pressure ulcer of other site, stage 2: Secondary | ICD-10-CM

## 2017-05-15 DIAGNOSIS — R0902 Hypoxemia: Secondary | ICD-10-CM

## 2017-05-15 LAB — CBC
HEMATOCRIT: 37.7 % (ref 36.0–46.0)
HEMOGLOBIN: 11 g/dL — AB (ref 12.0–15.0)
MCH: 24.4 pg — ABNORMAL LOW (ref 26.0–34.0)
MCHC: 29.2 g/dL — ABNORMAL LOW (ref 30.0–36.0)
MCV: 83.8 fL (ref 78.0–100.0)
PLATELETS: 360 10*3/uL (ref 150–400)
RBC: 4.5 MIL/uL (ref 3.87–5.11)
RDW: 17.3 % — AB (ref 11.5–15.5)
WBC: 9.7 10*3/uL (ref 4.0–10.5)

## 2017-05-15 LAB — COMPREHENSIVE METABOLIC PANEL
ALT: 15 U/L (ref 14–54)
ANION GAP: 12 (ref 5–15)
AST: 23 U/L (ref 15–41)
Albumin: 2.3 g/dL — ABNORMAL LOW (ref 3.5–5.0)
Alkaline Phosphatase: 75 U/L (ref 38–126)
BUN: 116 mg/dL — ABNORMAL HIGH (ref 6–20)
CHLORIDE: 107 mmol/L (ref 101–111)
CO2: 21 mmol/L — ABNORMAL LOW (ref 22–32)
Calcium: 12 mg/dL — ABNORMAL HIGH (ref 8.9–10.3)
Creatinine, Ser: 2.39 mg/dL — ABNORMAL HIGH (ref 0.44–1.00)
GFR, EST AFRICAN AMERICAN: 22 mL/min — AB (ref >60)
GFR, EST NON AFRICAN AMERICAN: 19 mL/min — AB (ref >60)
Glucose, Bld: 108 mg/dL — ABNORMAL HIGH (ref 65–99)
POTASSIUM: 3.8 mmol/L (ref 3.5–5.1)
Sodium: 140 mmol/L (ref 135–145)
Total Bilirubin: 0.4 mg/dL (ref 0.3–1.2)
Total Protein: 6.9 g/dL (ref 6.5–8.1)

## 2017-05-15 LAB — BLOOD CULTURE ID PANEL (REFLEXED)
Acinetobacter baumannii: NOT DETECTED
CANDIDA ALBICANS: NOT DETECTED
CANDIDA GLABRATA: NOT DETECTED
CANDIDA PARAPSILOSIS: NOT DETECTED
CANDIDA TROPICALIS: NOT DETECTED
Candida krusei: NOT DETECTED
ENTEROBACTER CLOACAE COMPLEX: NOT DETECTED
ENTEROBACTERIACEAE SPECIES: NOT DETECTED
Enterococcus species: NOT DETECTED
Escherichia coli: NOT DETECTED
Haemophilus influenzae: NOT DETECTED
KLEBSIELLA PNEUMONIAE: NOT DETECTED
Klebsiella oxytoca: NOT DETECTED
Listeria monocytogenes: NOT DETECTED
Methicillin resistance: NOT DETECTED
Neisseria meningitidis: NOT DETECTED
Proteus species: NOT DETECTED
Pseudomonas aeruginosa: NOT DETECTED
STREPTOCOCCUS PYOGENES: NOT DETECTED
STREPTOCOCCUS SPECIES: NOT DETECTED
Serratia marcescens: NOT DETECTED
Staphylococcus aureus (BCID): NOT DETECTED
Staphylococcus species: DETECTED — AB
Streptococcus agalactiae: NOT DETECTED
Streptococcus pneumoniae: NOT DETECTED

## 2017-05-15 LAB — MRSA PCR SCREENING: MRSA by PCR: POSITIVE — AB

## 2017-05-15 MED ORDER — CALCITONIN (SALMON) 200 UNIT/ML IJ SOLN
400.0000 [IU] | Freq: Every day | INTRAMUSCULAR | Status: AC
  Administered 2017-05-15 – 2017-05-16 (×2): 400 [IU] via SUBCUTANEOUS
  Filled 2017-05-15 (×3): qty 2

## 2017-05-15 MED ORDER — MUPIROCIN 2 % EX OINT
1.0000 "application " | TOPICAL_OINTMENT | Freq: Two times a day (BID) | CUTANEOUS | Status: AC
  Administered 2017-05-15 – 2017-05-18 (×9): 1 via NASAL
  Filled 2017-05-15 (×3): qty 22

## 2017-05-15 MED ORDER — ORAL CARE MOUTH RINSE
15.0000 mL | Freq: Two times a day (BID) | OROMUCOSAL | Status: DC
  Administered 2017-05-16 – 2017-05-24 (×10): 15 mL via OROMUCOSAL

## 2017-05-15 MED ORDER — CHLORHEXIDINE GLUCONATE CLOTH 2 % EX PADS
6.0000 | MEDICATED_PAD | Freq: Every day | CUTANEOUS | Status: AC
  Administered 2017-05-15 – 2017-05-19 (×5): 6 via TOPICAL

## 2017-05-15 MED ORDER — PRO-STAT SUGAR FREE PO LIQD
30.0000 mL | Freq: Three times a day (TID) | ORAL | Status: DC
  Administered 2017-05-15 – 2017-05-22 (×11): 30 mL via ORAL
  Filled 2017-05-15 (×15): qty 30

## 2017-05-15 NOTE — Progress Notes (Signed)
Pt arrived to 5w05 via stretcher. Transferred to bed, no family at the bedside initialy. Pt responding to verbal stimuli, very lethargic, oriented to self and place. No signs of acute distress, VS stable, pt receiving NS IV, unable to take medications due to lethargy, possible risk for aspiration.  Admission assessment completed, family at the bedside, family oriented to room and equipment and instructed on how to use call light if assistance needed. At this time, pt unable to receive any education due to being lethargic.  Will continue to monitor and treat pt per MD orders.

## 2017-05-15 NOTE — Progress Notes (Signed)
Initial Nutrition Assessment  DOCUMENTATION CODES:   Morbid obesity  INTERVENTION:   -30 ml Prostat TID, each supplement provides 100 kcals and 15 grams of protein -Continue MVI -RD will follow for diet advancement -Recommend SLP evaluation for safest diet  NUTRITION DIAGNOSIS:   Increased nutrient needs related to wound healing as evidenced by estimated needs.  GOAL:   Patient will meet greater than or equal to 90% of their needs  MONITOR:   PO intake, Supplement acceptance, Diet advancement, Labs, Weight trends, Skin, I & O's  REASON FOR ASSESSMENT:   Low Braden    ASSESSMENT:   Katherine Cooper is a 76 y/o woman with hx of dementia, HTN, CVA, obesity, DDD, CKD stage 3 and GERD; who presented to ED from SNF due to worsening mentation and SOB. Patient at baseline is essentially bed-ridden and oriented X 2 intermittently. For the last 3 days she has experienced increase confusion/disorientation and anorexia. 24 hrs prior to admission patient also have an episode of choking while being fed as per SNF nursing staff. On day of admission patient was essentially unresponsive, with non understandable mumbling, soft BP and hypoxic. EMS was called and patient transported to ED for further evaluation and treatment.  Pt admitted with acute encephalopathy and aspiration pneumonia.   Pt agitated at time of visit, stating "I don't want to eat, I just want out of this bed".   Hx of obtained from pt son at bedside, who describes that pt has experienced a general decline in health over the past 2-3 months since transfer to SNF. Son endorses weight loss and poor appetite, but unsure of how much weight she has lost. He shares that pt had a lot of fluid drawn off her legs prior to admission to SNF. Pt son reports pt does not feel like eating and appetite has declined significant since pt was downgraded to a pureed diet about 1 month ago related to swallowing difficulties.   Nutrition-Focused  physical exam completed. Findings are mild fat depletion, mild muscle depletion, and mild edema. Noted depletions are mainly in upper and lower extremities and may be related to bedbound status.   Pt son acknowledges presence of wounds and is very receptive to RD recommendations. Pt and son are amenable to MVI and Prostat supplements. Discussed importance of good meal and supplement intake in the presence of wound healing.   Labs reviewed: Phos: 4.8, Mg: 3.0.   Diet Order:  Diet full liquid Room service appropriate? Yes; Fluid consistency: Thin  Skin:  Wound (see comment) (stage II buttocks)  Last BM:  05/15/17  Height:   Ht Readings from Last 1 Encounters:  05/15/17 5\' 4"  (1.626 m)    Weight:   Wt Readings from Last 1 Encounters:  05/15/17 262 lb 4.8 oz (119 kg)    Ideal Body Weight:  54.5 kg  BMI:  Body mass index is 45.02 kg/m.  Estimated Nutritional Needs:   Kcal:  1600-1800  Protein:  95-110 grams  Fluid:  >1.6 L  EDUCATION NEEDS:   Education needs addressed  Laporsche Hoeger A. Jimmye Norman, RD, LDN, CDE Pager: [phone] After hours Pager: [phone]

## 2017-05-15 NOTE — Progress Notes (Addendum)
[facility] PROGRESS NOTE  Nataly Pacifico URK:[id number] DOB: 1941-12-16 DOA: 05/14/2017 PCP: Nolene Ebbs, MD  Interim summary and HPI 76 y/o woman with hx of dementia, HTN, CVA, obesity, DDD, CKD stage 3 and GERD; who presented to ED from SNF due to worsening mentation and SOB. Patient at baseline is essentially bed-ridden and oriented X 2 intermittently. For the last 3 days she has experienced increase confusion/disorientation and anorexia. 24 hrs prior to admission patient also have an episode of choking while being fed as per SNF nursing staff. On day of admission patient was essentially unresponsive, with non understandable mumbling, soft BP and hypoxic. Patient was found with left lower lobe infiltrates and also UA suggesting UTI. Her blood work also indicated acute on chronic renal failure.  Assessment/Plan: 1-Acute encephalopathy: appears to be toxic due to aspiration PNA and UTI. Patient also with hypercalcemia, which can contribute to AMS. -will continue Antibiotics For pneumonia and UTI -Follow cultures -no sepsis -continue IV fluids and calcitonin and mentioned below as part of treatment for hyperglycemia -continue supportive care -will follow clinical response  -speech therapy pending to see her   2-PNA/Pneumonitis: patient with LLL infiltrates, which is either atelectasis vs earlier PNA from aspiration. -Continue oxygen supplementation and wean it off as tolerated -Continue Zosyn for now -continue supportive care and use of IS/flutter valve  3-Obesity (BMI 30-39.9) -Body mass index is 34.33 kg/m. - low calorie intake and healthy options discussed with patient today.  4-Essential hypertension -Stable after initial aggressive fluid resuscitation. -Will monitor vital signs and continue use of Coreg  5-CKD (chronic kidney disease) stage 4, GFR 15-29 ml/min ([place]): with Acute renal failure due to dehydration, infection and continue use of diuretics. -Continue  holding nephrotoxic agents -continue IV fluids -continue antibiotics for UTI -minimize opportunity for hypotension  -follow renal function trend   6-Dehydration:  -Will continue holding diuretics -Continue IV fluids.  7-Alzheimer's dementia, late onset, with behavioral disturbance. -Currently without  Behavioral disturbances -Will continue Namenda, Aricept and supportive care.  8 Hypercalcemia -most likely secondary to dehydration and use of hydrochlorothiazide -will continue IV fluids -HCTZ on hold and with plans to be discontinued on discharge -follow electrolytes trend  -calcitonin subcutaneously and will check PTH.  9-metabolic acidosis -CO2 improved to 21 -renal function improving -will continue IVF's -follow electrolytes   10-depression and poor appetite -Continue Remeron  11-pressure injury -stage 2 -affecting toe in right foot -will follow preventive measures  Code Status: full code Family Communication: no family at bedside Disposition Plan: patient remained in the hospital and will be discharge back to skilled nursing facility once medically stable.   Consultants:  None   Procedures:  See below for x-ray reports  Antibiotics:  Zosyn 5/21  HPI/Subjective: Afebrile, improved, no chest pain, shortness of breath. Patient reports poor appetite. Still slightly short of breath and using oxygen supplementation ( about 2 L through nasal cannula).  Objective: Vitals:   05/15/17 0546 05/15/17 1033  BP: 118/69 (!) 97/97  Pulse: 96 94  Resp: 18   Temp: 97.9 F (36.6 C)     Intake/Output Summary (Last 24 hours) at 05/15/17 1344 Last data filed at 05/15/17 0933  Gross per 24 hour  Intake            512.5 ml  Output                0 ml  Net            512.5 ml  Filed Weights   05/14/17 1538 05/15/17 0545  Weight: 90.7 kg (200 lb) 119 kg (262 lb 4.8 oz)    Exam:   General: afebrile, patient is more awake, oriented 2 and able to commands.  Reports poor appetite and denies chest pain or shortness of breath.using only 2L [place] supplementation.  Cardiovascular: S1 and S2, positive systolic murmur, no rubs, no gallop  Respiratory: no wheezing, no crackles, no use of accessory muscles; patient with good air movement bilaterally.  Abdomen: Vague discomfort on deep palpation, no distention, no guarding, positive bowel sounds.  Musculoskeletal: no edema, no clubbing; patient with right foot stage 2 pressure injury (some dry blood appreciated; no oozing, no purulent discharge)  Data Reviewed: Basic Metabolic Panel:  Recent Labs Lab 05/14/17 1530 05/14/17 2115 05/15/17 0624  NA 135  --  140  K 4.3  --  3.8  CL 105  --  107  CO2 18*  --  21*  GLUCOSE 117*  --  108*  BUN 132*  --  116*  CREATININE 2.74*  --  2.39*  CALCIUM 11.2*  --  12.0*  MG  --  3.0*  --   PHOS  --  4.8*  --    Liver Function Tests:  Recent Labs Lab 05/14/17 1530 05/15/17 0624  AST 21 23  ALT 14 15  ALKPHOS 73 75  BILITOT 0.7 0.4  PROT 6.6 6.9  ALBUMIN 2.4* 2.3*   CBC:  Recent Labs Lab 05/14/17 1530 05/15/17 0624  WBC 9.9 9.7  NEUTROABS 7.4  --   HGB 10.9* 11.0*  HCT 36.1 37.7  MCV 82.8 83.8  PLT 344 360   BNP (last 3 results)  Recent Labs  02/05/17 2055 05/14/17 1530  BNP 54.6 74.9   CBG: No results for input(s): GLUCAP in the last 168 hours.  Recent Results (from the past 240 hour(s))  Blood culture (routine x 2)     Status: None (Preliminary result)   Collection Time: 05/14/17  3:30 PM  Result Value Ref Range Status   Specimen Description BLOOD LEFT ANTECUBITAL  Final   Special Requests   Final    BOTTLES DRAWN AEROBIC AND ANAEROBIC Blood Culture adequate volume   Culture NO GROWTH < 24 HOURS  Final   Report Status PENDING  Incomplete  Blood culture (routine x 2)     Status: None (Preliminary result)   Collection Time: 05/14/17  4:55 PM  Result Value Ref Range Status   Specimen Description BLOOD RIGHT HAND  Final    Special Requests   Final    BOTTLES DRAWN AEROBIC ONLY Blood Culture adequate volume   Culture NO GROWTH < 24 HOURS  Final   Report Status PENDING  Incomplete  MRSA PCR Screening     Status: Abnormal   Collection Time: 05/14/17  9:00 PM  Result Value Ref Range Status   MRSA by PCR POSITIVE (A) NEGATIVE Final    Comment:        The GeneXpert MRSA Assay (FDA approved for NASAL specimens only), is one component of a comprehensive MRSA colonization surveillance program. It is not intended to diagnose MRSA infection nor to guide or monitor treatment for MRSA infections. RESULT CALLED TO, READ BACK BY AND VERIFIED WITH: I.MASLENJAK,RN AT 0157 BY L.PITT 05/15/17      Studies: Ct Head Wo Contrast  Result Date: 05/14/2017 CLINICAL DATA:  Altered mental status EXAM: CT HEAD WITHOUT CONTRAST TECHNIQUE: Contiguous axial images were obtained from the base of  the skull through the vertex without intravenous contrast. COMPARISON:  09/04/2013 FINDINGS: Brain: No evidence of acute infarction, hemorrhage, hydrocephalus, extra-axial collection or mass lesion/mass effect. Mild atrophy. Mild periventricular and deep white matter small vessel ischemic changes. Vascular: No hyperdense vessels.  Carotid artery calcifications. Skull: Normal. Negative for fracture or focal lesion. Sinuses/Orbits: Minimal mucosal thickening in the ethmoid sinuses. No acute orbital abnormality. Bilateral lens extraction. Other: None IMPRESSION: No CT evidence for acute intracranial abnormality. Electronically Signed   By: Donavan Foil M.D.   On: 05/14/2017 16:11   Dg Chest Portable 1 View  Result Date: 05/14/2017 CLINICAL DATA:  Altered mental status EXAM: PORTABLE CHEST 1 VIEW COMPARISON:  02/05/2017 FINDINGS: Low lung volumes with left basilar atelectasis. No focal consolidation or effusion. Stable enlarged cardiomediastinal silhouette with dilatation and tortuosity of the aortic knob. No pneumothorax. IMPRESSION: Low lung  volumes with left basilar atelectasis. Electronically Signed   By: Donavan Foil M.D.   On: 05/14/2017 15:42   Dg Shoulder Left  Result Date: 05/14/2017 CLINICAL DATA:  Left shoulder to formed in with pain EXAM: LEFT SHOULDER - 2+ VIEW COMPARISON:  None. FINDINGS: No fracture or dislocation. Minimal inferior collateral humeral degenerative change and AC joint degenerative change. The left lung apex is clear. IMPRESSION: No acute osseous abnormality. Electronically Signed   By: Donavan Foil M.D.   On: 05/14/2017 18:59    Scheduled Meds: . aspirin EC  81 mg Oral Daily  . carvedilol  3.125 mg Oral BID WC  . Chlorhexidine Gluconate Cloth  6 each Topical Q0600  . donepezil  10 mg Oral QHS  . feeding supplement (PRO-STAT SUGAR FREE 64)  30 mL Oral TID BM  . fluticasone  2 spray Each Nare Daily  . heparin  5,000 Units Subcutaneous Q8H  . loratadine  10 mg Oral Daily  . mouth rinse  15 mL Mouth Rinse BID  . memantine  28 mg Oral [id number]  . mirtazapine  15 mg Oral QHS  . multivitamin with minerals  1 tablet Oral Daily  . mupirocin ointment  1 application Nasal BID  . pantoprazole  40 mg Oral Daily  . senna-docusate  2 tablet Oral QHS  . simvastatin  10 mg Oral QHS   Continuous Infusions: . sodium chloride 75 mL/hr at 05/15/17 0358  . piperacillin-tazobactam (ZOSYN)  IV Stopped (05/15/17 1194)    Principal Problem:   Acute encephalopathy Active Problems:   Obesity (BMI 30-39.9)   Essential hypertension   CKD (chronic kidney disease) stage 4, GFR 15-29 ml/min (HCC)   Dehydration   Acute renal failure (ARF) (HCC)   Alzheimer's dementia, late onset, with behavioral disturbance   Face, Legs, Activity, Cry, Consolability (FLACC) pain assessment scale cry score 2, crying steadily, screams or sobs, frequent complaints   Hypercalcemia   Pressure injury of skin    Time spent: 25 minutes    Barton Dubois  [facility] Pager [phone]. If 7PM-7AM, please contact night-coverage at  [email or web address], password [facility] 05/15/2017, 1:44 PM  LOS: 1 day

## 2017-05-15 NOTE — Progress Notes (Signed)
PHARMACY - PHYSICIAN COMMUNICATION CRITICAL VALUE ALERT - BLOOD CULTURE IDENTIFICATION (BCID)  Results for orders placed or performed during the hospital encounter of 05/14/17  Blood Culture ID Panel (Reflexed) (Collected: 05/14/2017  4:55 PM)  Result Value Ref Range   Enterococcus species NOT DETECTED NOT DETECTED   Listeria monocytogenes NOT DETECTED NOT DETECTED   Staphylococcus species DETECTED (A) NOT DETECTED   Staphylococcus aureus NOT DETECTED NOT DETECTED   Methicillin resistance NOT DETECTED NOT DETECTED   Streptococcus species NOT DETECTED NOT DETECTED   Streptococcus agalactiae NOT DETECTED NOT DETECTED   Streptococcus pneumoniae NOT DETECTED NOT DETECTED   Streptococcus pyogenes NOT DETECTED NOT DETECTED   Acinetobacter baumannii NOT DETECTED NOT DETECTED   Enterobacteriaceae species NOT DETECTED NOT DETECTED   Enterobacter cloacae complex NOT DETECTED NOT DETECTED   Escherichia coli NOT DETECTED NOT DETECTED   Klebsiella oxytoca NOT DETECTED NOT DETECTED   Klebsiella pneumoniae NOT DETECTED NOT DETECTED   Proteus species NOT DETECTED NOT DETECTED   Serratia marcescens NOT DETECTED NOT DETECTED   Haemophilus influenzae NOT DETECTED NOT DETECTED   Neisseria meningitidis NOT DETECTED NOT DETECTED   Pseudomonas aeruginosa NOT DETECTED NOT DETECTED   Candida albicans NOT DETECTED NOT DETECTED   Candida glabrata NOT DETECTED NOT DETECTED   Candida krusei NOT DETECTED NOT DETECTED   Candida parapsilosis NOT DETECTED NOT DETECTED   Candida tropicalis NOT DETECTED NOT DETECTED    Name of physician (or Provider) Contacted: Reino Bellis  Changes to prescribed antibiotics required: None, CNS likely contaminant, and only present in 1 of 4 bottles.  Uvaldo Rising, BCPS  Clinical Pharmacist Pager [phone]  05/15/2017 5:43 PM

## 2017-05-16 LAB — BASIC METABOLIC PANEL
Anion gap: 10 (ref 5–15)
BUN: 99 mg/dL — AB (ref 6–20)
CALCIUM: 11.4 mg/dL — AB (ref 8.9–10.3)
CHLORIDE: 112 mmol/L — AB (ref 101–111)
CO2: 21 mmol/L — AB (ref 22–32)
CREATININE: 1.91 mg/dL — AB (ref 0.44–1.00)
GFR calc Af Amer: 28 mL/min — ABNORMAL LOW (ref >60)
GFR calc non Af Amer: 24 mL/min — ABNORMAL LOW (ref >60)
Glucose, Bld: 133 mg/dL — ABNORMAL HIGH (ref 65–99)
POTASSIUM: 3.6 mmol/L (ref 3.5–5.1)
Sodium: 143 mmol/L (ref 135–145)

## 2017-05-16 LAB — URINE CULTURE

## 2017-05-16 LAB — PARATHYROID HORMONE, INTACT (NO CA): PTH: 120 pg/mL — ABNORMAL HIGH (ref 15–65)

## 2017-05-16 MED ORDER — MEROPENEM 1 G IV SOLR
1.0000 g | Freq: Two times a day (BID) | INTRAVENOUS | Status: AC
  Administered 2017-05-16 – 2017-05-20 (×10): 1 g via INTRAVENOUS
  Filled 2017-05-16 (×10): qty 1

## 2017-05-16 MED ORDER — POLYETHYLENE GLYCOL 3350 17 G PO PACK: 17.0000 g | PACK | Freq: Every day | ORAL | Status: DC

## 2017-05-16 MED ORDER — CLOTRIMAZOLE 1 % EX CREA
TOPICAL_CREAM | Freq: Two times a day (BID) | CUTANEOUS | Status: DC
  Administered 2017-05-16: 23:00:00 via TOPICAL
  Administered 2017-05-17: 1 via TOPICAL
  Administered 2017-05-18 – 2017-05-24 (×10): via TOPICAL
  Filled 2017-05-16: qty 15

## 2017-05-16 MED ORDER — SENNOSIDES-DOCUSATE SODIUM 8.6-50 MG PO TABS
2.0000 | ORAL_TABLET | Freq: Every day | ORAL | Status: DC
  Administered 2017-05-16 – 2017-05-22 (×5): 2 via ORAL
  Filled 2017-05-16 (×6): qty 2

## 2017-05-16 MED ORDER — SENNOSIDES-DOCUSATE SODIUM 8.6-50 MG PO TABS: 2.0000 | ORAL_TABLET | Freq: Two times a day (BID) | ORAL | Status: DC

## 2017-05-16 NOTE — Progress Notes (Signed)
  Pharmacy Antibiotic Note  Katherine Cooper is a 76 y.o. female admitted on 05/14/2017 with multiple medical issues, now w/ urine Cx showing ESBL infection.  Pharmacy has been consulted for Merrem dosing, switching from Zosyn.  Plan: Merrem 1g IV Q12H.  Height: 5\' 4"  (162.6 cm) Weight: 262 lb 4.8 oz (119 kg) IBW/kg (Calculated) : 54.7  Temp (24hrs), Avg:98 F (36.7 C), Min:97.7 F (36.5 C), Max:98.4 F (36.9 C)   Recent Labs Lab 05/14/17 1530 05/14/17 1540 05/15/17 0624 05/16/17 0442  WBC 9.9  --  9.7  --   CREATININE 2.74*  --  2.39* 1.91*  LATICACIDVEN  --  1.18  --   --     Estimated Creatinine Clearance: 31.8 mL/min (A) (by C-G formula based on SCr of 1.91 mg/dL (H)).    Allergies  Allergen Reactions  . Sulfa Antibiotics Itching     Thank you for allowing pharmacy to be a part of this patient's care.  Wynona Neat, PharmD, BCPS  05/16/2017 7:39 AM

## 2017-05-16 NOTE — NC FL2 (Signed)
[place] LEVEL OF CARE SCREENING TOOL     IDENTIFICATION  Patient Name: Katherine Cooper Birthdate: [birth year] Sex: female Admission Date (Current Location): 05/14/2017  [facility] and [place] Number:  Herbalist and Address:  [facility] [place] [address] [place], [place] [address]      Provider Number: [id number]  Attending Physician Name and Address:  Lavina Hamman, MD  Relative Name and Phone Number:  Metro Kung, daughter, [phone]    Current Level of Care: Hospital Recommended Level of Care: [place] Prior Approval Number:    Date Approved/Denied:   PASRR Number:   [id number]  Discharge Plan: SNF    Current Diagnoses: Patient Active Problem List   Diagnosis Date Noted  . Pressure injury of skin 05/15/2017  . Acute encephalopathy 05/14/2017  . Face, Legs, Activity, Cry, Consolability (FLACC) pain assessment scale cry score 2, crying steadily, screams or sobs, frequent complaints 05/14/2017  . Hypercalcemia 05/14/2017  . Alzheimer's dementia, late onset, with behavioral disturbance 02/06/2017  . Leukocytosis 02/06/2017  . Leg swelling 02/06/2017  . Normocytic anemia 02/06/2017  . Acute renal failure (ARF) ([place]) 02/05/2017  . Chest pain 10/12/2013  . Palpitations 10/12/2013  . Acute bronchitis 10/12/2013  . ARF (acute renal failure) ([place]) 10/12/2013  . SOB (shortness of breath) 05/26/2013  . Breast cancer ([place]) 10/22/2012  . Dehydration 07/22/2012  . Chronic pain 07/22/2012  . Anemia 07/22/2012  . Hypotension 07/21/2012  . Hip pain 07/21/2012  . Anxiety   . Elbow pain 07/09/2012  . PNA (pneumonia) 07/08/2012  . Hyperkalemia 07/08/2012  . Essential hypertension 07/08/2012  . CKD (chronic kidney disease) stage 4, GFR 15-29 ml/min (HCC) 07/08/2012  . Chest pain at rest 11/12/2011    Class: Acute  . Bilateral leg edema 11/12/2011    Class: Chronic  . Obesity (BMI 30-39.9) 11/12/2011    Class:  Chronic  . Gout 11/12/2011    Class: Chronic    Orientation RESPIRATION BLADDER Height & Weight     Self, Place  O2 (Nasal cannula 3L) Incontinent Weight: 119 kg (262 lb 4.8 oz) Height:  5\' 4"  (162.6 cm)  BEHAVIORAL SYMPTOMS/MOOD NEUROLOGICAL BOWEL NUTRITION STATUS      Continent Diet (Please see DC Summary)  AMBULATORY STATUS COMMUNICATION OF NEEDS Skin   Extensive Assist Verbally PU Stage and Appropriate Care (Stage II pressure injury on buttocks; wound on toe)                       Personal Care Assistance Level of Assistance  Bathing, Feeding, Dressing Bathing Assistance: Maximum assistance Feeding assistance: Limited assistance Dressing Assistance: Maximum assistance     Functional Limitations Info             SPECIAL CARE FACTORS FREQUENCY                       Contractures      Additional Factors Info  Code Status, Allergies, Isolation Precautions Code Status Info: Full Allergies Info: Sulfa Antibiotics     Isolation Precautions Info: MRSA     Current Medications (05/16/2017):  This is the current hospital active medication list Current Facility-Administered Medications  Medication Dose Route Frequency Provider Last Rate Last Dose  . 0.9 %  sodium chloride infusion   Intravenous Continuous Barton Dubois, MD 75 mL/hr at 05/16/17 0414    . acetaminophen (TYLENOL) tablet 650 mg  650 mg Oral Q6H  PRN Barton Dubois, MD      . aspirin EC tablet 81 mg  81 mg Oral Daily Barton Dubois, MD   81 mg at 05/15/17 1050  . calcitonin (MIACALCIN) injection 400 Units  400 Units Subcutaneous Daily Barton Dubois, MD   400 Units at 05/15/17 1616  . carvedilol (COREG) tablet 3.125 mg  3.125 mg Oral BID WC Barton Dubois, MD   3.125 mg at 05/15/17 1619  . Chlorhexidine Gluconate Cloth 2 % PADS 6 each  6 each Topical Q0600 Barton Dubois, MD   6 each at 05/16/17 [phone]  . donepezil (ARICEPT) tablet 10 mg  10 mg Oral QHS Barton Dubois, MD   10 mg at 05/15/17 2138  .  feeding supplement (PRO-STAT SUGAR FREE 64) liquid 30 mL  30 mL Oral TID BM Barton Dubois, MD   30 mL at 05/16/17 0525  . fluticasone (FLONASE) 50 MCG/ACT nasal spray 2 spray  2 spray Each Nare Daily Barton Dubois, MD      . heparin injection 5,000 Units  5,000 Units Subcutaneous Q8H Barton Dubois, MD   5,000 Units at 05/16/17 [phone]  . HYDROcodone-acetaminophen (NORCO/VICODIN) 5-325 MG per tablet 1 tablet  1 tablet Oral Q12H PRN Barton Dubois, MD   1 tablet at 05/15/17 1050  . loratadine (CLARITIN) tablet 10 mg  10 mg Oral Daily Barton Dubois, MD   10 mg at 05/15/17 1050  . MEDLINE mouth rinse  15 mL Mouth Rinse BID Barton Dubois, MD   15 mL at 05/16/17 0525  . memantine (NAMENDA XR) 24 hr capsule 28 mg  28 mg Oral Blair Hailey, MD   28 mg at 05/16/17 4431  . meropenem (MERREM) 1 g in sodium chloride 0.9 % 100 mL IVPB  1 g Intravenous Q12H Laren Everts, RPH      . mirtazapine (REMERON) tablet 15 mg  15 mg Oral QHS Barton Dubois, MD   15 mg at 05/15/17 2138  . multivitamin with minerals tablet 1 tablet  1 tablet Oral Daily Barton Dubois, MD   1 tablet at 05/15/17 1050  . mupirocin ointment (BACTROBAN) 2 % 1 application  1 application Nasal BID Barton Dubois, MD   1 application at 54/00/86 2138  . ondansetron (ZOFRAN) tablet 4 mg  4 mg Oral Q6H PRN Barton Dubois, MD       Or  . ondansetron [facility]) injection 4 mg  4 mg Intravenous Q6H PRN Barton Dubois, MD      . pantoprazole (PROTONIX) EC tablet 40 mg  40 mg Oral Daily Barton Dubois, MD   40 mg at 05/15/17 1050  . senna-docusate (Senokot-S) tablet 2 tablet  2 tablet Oral QHS Lavina Hamman, MD      . simvastatin (ZOCOR) tablet 10 mg  10 mg Oral QHS Barton Dubois, MD   10 mg at 05/15/17 2139     Discharge Medications: Please see discharge summary for a list of discharge medications.  Relevant Imaging Results:  Relevant Lab Results:   Additional Information SSN: [id number]  Benard Halsted, LCSWA

## 2017-05-16 NOTE — Clinical Social Work Note (Signed)
Clinical Social Work Assessment  Patient Details  Name: Katherine Cooper MRN: [id number] Date of Birth: November 06, 1941  Date of referral:  05/16/17               Reason for consult:  Facility Placement                Permission sought to share information with:  Facility Sport and exercise psychologist, Family Supports Permission granted to share information::  Yes, Verbal Permission Granted  Name::     Equities trader::  [place]  Relationship::  Daughter  Contact Information:  [phone]  Housing/Transportation Living arrangements for the past 2 months:  [place] of Information:  Adult Children Patient Interpreter Needed:  None Criminal Activity/Legal Involvement Pertinent to Current Situation/Hospitalization:  No - Comment as needed Significant Relationships:  Adult Children Lives with:  Facility Resident Do you feel safe going back to the place where you live?  Yes Need for family participation in patient care:  No (Coment)  Care giving concerns:  CSW received consult regarding discharge planning. Patient is disoriented. CSW spoke with patient's daughter, Katherine Cooper. Patient resides at [facility] and will return there at discharge. CSW to continue to follow and assist with discharge planning needs.   Social Worker assessment / plan:  CSW spoke with patient's daughter concerning return to [facility] at discharge.   Employment status:  Retired Nurse, adult PT Recommendations:  Not assessed at this time Information / Referral to community resources:  [place]  Patient/Family's Response to care:  Patient's daughter expresses agreement with discharge plan. She would like patient to be transported by [facility]   Patient/Family's Understanding of and Emotional Response to Diagnosis, Current Treatment, and Prognosis:  Patient/family is realistic regarding medical needs and expressed being hopeful for SNF placement.  Patient's daughter expressed understanding of CSW role and discharge process. No questions/concerns about plan or treatment.    Emotional Assessment Appearance:  Appears stated age Attitude/Demeanor/Rapport:  Unable to Assess Affect (typically observed):  Unable to Assess Orientation:  Oriented to Self, Oriented to Place Alcohol / Substance use:  Not Applicable Psych involvement (Current and /or in the community):  No (Comment)  Discharge Needs  Concerns to be addressed:  Care Coordination Readmission within the last 30 days:  No Current discharge risk:  None Barriers to Discharge:  Continued Medical Work up   [employer], [place] 05/16/2017, 8:39 AM

## 2017-05-16 NOTE — Progress Notes (Signed)
Received patient, lying in bed, with family at bedside.  Patient has droop and lying on her side.   Not very talkative. Bed alarm on.Safety Maintained.Report given to me by [facility].

## 2017-05-16 NOTE — Progress Notes (Signed)
Patient informed me she could not swallow pills whole.  She stated she had to have pills crushed in applesauce, however, she did not take half of it.

## 2017-05-16 NOTE — Progress Notes (Signed)
[facility] Progress Note  Patient: Katherine Cooper FFM:[id number]   PCP: Nolene Ebbs, MD DOB: 03/21/1941   DOA: 05/14/2017   DOS: 05/16/2017   Date of Service: the patient was seen and examined on 05/16/2017  Subjective: Feeling better, no acute complaint. Refused examination.   Brief hospital course: Pt. with PMH of dementia, HTN, CVA, obesity, CKD stage III, GERD; admitted on 05/14/2017, presented with complaint of confusion, was found to have UTI and CAP. Currently further plan is continued IV antibiotics.  Assessment and Plan: 1. ESBL UTI. Community-acquired pneumonia. Acute encephalopathy. Blood cultures are negative, 1 out of 2 positive for prognosis negative staph which is likely contamination. Urine is growing Klebsiella which is ESBL. Patient was on IV Zosyn, I would change to IV meropenem. Patient with at least require 5 Days of Treatment. Continue IV Fluids. Speech Therapy Consulted.  2. Acute on chronic kidney disease stage IV. Due to dehydration and infection. Avoid nephrotoxic medication. Continue IV fluids. Renal function getting better.  3. Essential hypertension. Continue home regimen at present.  4. Alzheimer's dementia. Continue home regimen.  5. Hyperglycemia. Calcium level getting better, continue IV hydration at present.  6.. Stage II pressure injury of the right foot and heel. Continue supportive measures.    Diet: cadiac diet DVT Prophylaxis: subcutaneous Heparin  Advance goals of care discussion: full code  Family Communication: no family was present at bedside, at the time of interview.  Disposition:  Discharge to SNF.  Consultants: none Procedures: none  Antibiotics: Anti-infectives    Start     Dose/Rate Route Frequency Ordered Stop   05/16/17 1000  meropenem (MERREM) 1 g in sodium chloride 0.9 % 100 mL IVPB     1 g 200 mL/hr over 30 Minutes Intravenous Every 12 hours 05/16/17 0744     05/14/17 2200   piperacillin-tazobactam (ZOSYN) IVPB 3.375 g  Status:  Discontinued     3.375 g 12.5 mL/hr over 240 Minutes Intravenous Every 8 hours 05/14/17 2020 05/16/17 0736   05/14/17 1715  cefTRIAXone (ROCEPHIN) 1 g in dextrose 5 % 50 mL IVPB     1 g 100 mL/hr over 30 Minutes Intravenous  Once 05/14/17 1700 05/14/17 1823       Objective: Physical Exam: Vitals:   05/15/17 2152 05/16/17 0522 05/16/17 0744 05/16/17 1429  BP: (!) 143/88 97/80 95/64  112/81  Pulse: (!) 105 90 90 94  Resp: 18 18  16   Temp: 97.9 F (36.6 C) 97.7 F (36.5 C)  97.9 F (36.6 C)  TempSrc: Oral Oral    SpO2: 100% 100%  100%  Weight:      Height:        Intake/Output Summary (Last 24 hours) at 05/16/17 1713 Last data filed at 05/16/17 1525  Gross per 24 hour  Intake          2926.25 ml  Output                0 ml  Net          2926.25 ml   Filed Weights   05/14/17 1538 05/15/17 0545  Weight: 90.7 kg (200 lb) 119 kg (262 lb 4.8 oz)   General: Alert, Awake and Oriented to Time, Place and Person. Appear in moderate distress, affect appropriate Refused further examination.   Data Reviewed: CBC:  Recent Labs Lab 05/14/17 1530 05/15/17 0624  WBC 9.9 9.7  NEUTROABS 7.4  --   HGB 10.9* 11.0*  HCT 36.1 37.7  MCV  82.8 83.8  PLT 344 875   Basic Metabolic Panel:  Recent Labs Lab 05/14/17 1530 05/14/17 2115 05/15/17 0624 05/16/17 0442  NA 135  --  140 143  K 4.3  --  3.8 3.6  CL 105  --  107 112*  CO2 18*  --  21* 21*  GLUCOSE 117*  --  108* 133*  BUN 132*  --  116* 99*  CREATININE 2.74*  --  2.39* 1.91*  CALCIUM 11.2*  --  12.0* 11.4*  MG  --  3.0*  --   --   PHOS  --  4.8*  --   --     Liver Function Tests:  Recent Labs Lab 05/14/17 1530 05/15/17 0624  AST 21 23  ALT 14 15  ALKPHOS 73 75  BILITOT 0.7 0.4  PROT 6.6 6.9  ALBUMIN 2.4* 2.3*   No results for input(s): LIPASE, AMYLASE in the last 168 hours. No results for input(s): AMMONIA in the last 168 hours. Coagulation  Profile: No results for input(s): INR, PROTIME in the last 168 hours. Cardiac Enzymes: No results for input(s): CKTOTAL, CKMB, CKMBINDEX, TROPONINI in the last 168 hours. BNP (last 3 results) No results for input(s): PROBNP in the last 8760 hours. CBG: No results for input(s): GLUCAP in the last 168 hours. Studies: No results found.  Scheduled Meds: . aspirin EC  81 mg Oral Daily  . Chlorhexidine Gluconate Cloth  6 each Topical Q0600  . clotrimazole   Topical BID  . donepezil  10 mg Oral QHS  . feeding supplement (PRO-STAT SUGAR FREE 64)  30 mL Oral TID BM  . fluticasone  2 spray Each Nare Daily  . heparin  5,000 Units Subcutaneous Q8H  . loratadine  10 mg Oral Daily  . mouth rinse  15 mL Mouth Rinse BID  . memantine  28 mg Oral BH-q7a  . mirtazapine  15 mg Oral QHS  . multivitamin with minerals  1 tablet Oral Daily  . mupirocin ointment  1 application Nasal BID  . pantoprazole  40 mg Oral Daily  . senna-docusate  2 tablet Oral QHS  . simvastatin  10 mg Oral QHS   Continuous Infusions: . sodium chloride 75 mL/hr at 05/16/17 0414  . meropenem (MERREM) IV Stopped (05/16/17 1045)   PRN Meds: acetaminophen, HYDROcodone-acetaminophen, ondansetron **OR** ondansetron (ZOFRAN) IV  Time spent: 30 minutes  Author: Berle Mull, MD [facility] Pager: [phone] 05/16/2017 5:13 PM  If 7PM-7AM, please contact night-coverage at [email or web address], password [facility]

## 2017-05-17 LAB — CULTURE, BLOOD (ROUTINE X 2): SPECIAL REQUESTS: ADEQUATE

## 2017-05-17 NOTE — Care Management Note (Signed)
Case Management Note  Patient Details  Name: Katherine Cooper MRN: [id number] Date of Birth: 29-Jul-1941  Subjective/Objective:        Admitted with acute encephalopathy/ UTI,hx of dementia, HTN, CVA, obesity, DDD, CKD stage 3 and GERD.  Gerlean Ren (Daughter) York Pellant (Daughter)    [phone]      Action/Plan: Plan is to d/c back to SNF, [facility] when medically stable. CSW managing disposition to SNF. CM will continue to follow as needs presents.  Expected Discharge Date:  05/18/2017             Expected Discharge Plan:  [place]  In-House Referral:  Clinical Social Work  Discharge planning Services  CM Consult   Status of Service:  Completed, signed off  If discussed at [employer] of Stay Meetings, dates discussed:    Additional Comments:  Sharin Mons, RN 05/17/2017, 2:18 PM

## 2017-05-17 NOTE — Progress Notes (Signed)
[facility] Progress Note  Patient: Katherine Cooper ERX:[id number]   PCP: Nolene Ebbs, MD DOB: 1941-03-27   DOA: 05/14/2017   DOS: 05/17/2017   Date of Service: the patient was seen and examined on 05/17/2017  Subjective: Feeling better, no acute complaint. No acute events overnight.  Brief hospital course: Pt. with PMH of dementia, HTN, CVA, obesity, CKD stage III, GERD; admitted on 05/14/2017, presented with complaint of confusion, was found to have UTI and CAP. Currently further plan is continued IV antibiotics.  Assessment and Plan: 1. ESBL UTI. Community-acquired pneumonia. Acute encephalopathy. Blood cultures are negative, 1 out of 2 positive for prognosis negative staph which is likely contamination. Urine is growing Klebsiella which is ESBL. Patient was on IV Zosyn, I would change to IV meropenem. Will Discuss with ID regarding treatment option duration. Continue IV Fluids.  2. Acute on chronic kidney disease stage IV. Due to dehydration and infection. BUN more than 100, serum creatinine significantly elevated from baseline. Avoid nephrotoxic medication. Continue IV fluids. Renal function getting better.  3. Essential hypertension. Continue home regimen at present.  4. Alzheimer's dementia. Continue home regimen.  5. Hyperglycemia. Calcium level getting better, continue IV hydration at present.  6.. Stage II pressure injury of the right foot and heel. Continue supportive measures.  Diet: cardiac diet DVT Prophylaxis: subcutaneous Heparin  Advance goals of care discussion: full code  Family Communication: no family was present at bedside, at the time of interview.  Disposition:  Discharge to SNF.  Consultants: none Procedures: none  Antibiotics: Anti-infectives    Start     Dose/Rate Route Frequency Ordered Stop   05/16/17 1000  meropenem (MERREM) 1 g in sodium chloride 0.9 % 100 mL IVPB     1 g 200 mL/hr over 30 Minutes Intravenous Every 12  hours 05/16/17 0744     05/14/17 2200  piperacillin-tazobactam (ZOSYN) IVPB 3.375 g  Status:  Discontinued     3.375 g 12.5 mL/hr over 240 Minutes Intravenous Every 8 hours 05/14/17 2020 05/16/17 0736   05/14/17 1715  cefTRIAXone (ROCEPHIN) 1 g in dextrose 5 % 50 mL IVPB     1 g 100 mL/hr over 30 Minutes Intravenous  Once 05/14/17 1700 05/14/17 1823       Objective: Physical Exam: Vitals:   05/16/17 2212 05/17/17 0457 05/17/17 0458 05/17/17 1343  BP: (!) 140/95 128/83  112/76  Pulse: (!) 104 98 97 97  Resp: 18 18  16   Temp: 97.2 F (36.2 C)  97.8 F (36.6 C) 98.9 F (37.2 C)  TempSrc: Oral  Oral   SpO2: 100% 100% 100% 100%  Weight:  76.3 kg (168 lb 3.2 oz)    Height:        Intake/Output Summary (Last 24 hours) at 05/17/17 1906 Last data filed at 05/17/17 1847  Gross per 24 hour  Intake          2288.75 ml  Output                2 ml  Net          2286.75 ml   Filed Weights   05/14/17 1538 05/15/17 0545 05/17/17 0457  Weight: 90.7 kg (200 lb) 119 kg (262 lb 4.8 oz) 76.3 kg (168 lb 3.2 oz)   General: Alert, Awake and Oriented to Time, Place and Person. Appear in mild distress, affect appropriate Eyes: PERRL, Conjunctiva normal ENT: Oral Mucosa clear moist. Neck: difficult to assess JVD, no Abnormal Mass Or  lumps Cardiovascular: S1 and S2 Present, no Murmur, Peripheral Pulses Present Respiratory: Bilateral Air entry equal and Decreased, no use of accessory muscle, Clear to Auscultation, no Crackles, no wheezes Abdomen: Bowel Sound present, Soft and no tenderness Skin: redness no, no Rash, no induration Extremities: no Pedal edema, no calf tenderness Neurologic: Grossly no focal neuro deficit. Bilaterally Equal motor strength  Data Reviewed: CBC:  Recent Labs Lab 05/14/17 1530 05/15/17 0624  WBC 9.9 9.7  NEUTROABS 7.4  --   HGB 10.9* 11.0*  HCT 36.1 37.7  MCV 82.8 83.8  PLT 344 924   Basic Metabolic Panel:  Recent Labs Lab 05/14/17 1530 05/14/17 2115  05/15/17 0624 05/16/17 0442  NA 135  --  140 143  K 4.3  --  3.8 3.6  CL 105  --  107 112*  CO2 18*  --  21* 21*  GLUCOSE 117*  --  108* 133*  BUN 132*  --  116* 99*  CREATININE 2.74*  --  2.39* 1.91*  CALCIUM 11.2*  --  12.0* 11.4*  MG  --  3.0*  --   --   PHOS  --  4.8*  --   --     Liver Function Tests:  Recent Labs Lab 05/14/17 1530 05/15/17 0624  AST 21 23  ALT 14 15  ALKPHOS 73 75  BILITOT 0.7 0.4  PROT 6.6 6.9  ALBUMIN 2.4* 2.3*   No results for input(s): LIPASE, AMYLASE in the last 168 hours. No results for input(s): AMMONIA in the last 168 hours. Coagulation Profile: No results for input(s): INR, PROTIME in the last 168 hours. Cardiac Enzymes: No results for input(s): CKTOTAL, CKMB, CKMBINDEX, TROPONINI in the last 168 hours. BNP (last 3 results) No results for input(s): PROBNP in the last 8760 hours. CBG: No results for input(s): GLUCAP in the last 168 hours. Studies: No results found.  Scheduled Meds: . aspirin EC  81 mg Oral Daily  . Chlorhexidine Gluconate Cloth  6 each Topical Q0600  . clotrimazole   Topical BID  . donepezil  10 mg Oral QHS  . feeding supplement (PRO-STAT SUGAR FREE 64)  30 mL Oral TID BM  . fluticasone  2 spray Each Nare Daily  . heparin  5,000 Units Subcutaneous Q8H  . loratadine  10 mg Oral Daily  . mouth rinse  15 mL Mouth Rinse BID  . memantine  28 mg Oral BH-q7a  . mirtazapine  15 mg Oral QHS  . multivitamin with minerals  1 tablet Oral Daily  . mupirocin ointment  1 application Nasal BID  . pantoprazole  40 mg Oral Daily  . senna-docusate  2 tablet Oral QHS  . simvastatin  10 mg Oral QHS   Continuous Infusions: . sodium chloride 1,000 mL (05/17/17 1118)  . meropenem (MERREM) IV Stopped (05/17/17 1030)   PRN Meds: acetaminophen, HYDROcodone-acetaminophen, ondansetron **OR** ondansetron (ZOFRAN) IV  Time spent: 30 minutes  Author: Berle Mull, MD [facility] Pager: [phone] 05/17/2017 7:06 PM  If  7PM-7AM, please contact night-coverage at [email or web address], password [facility]

## 2017-05-17 NOTE — Consult Note (Signed)
   [facility] CM Inpatient Consult   05/17/2017  Katherine Cooper [birth year] [id number]  Patient assessed for [facility] Management needs in the [place] registry. Chart reveals the patient is to return to skilled nursing facility at discharge.  No community [facility] Care Management needs at the current time.  For questions, please contact:  Natividad Brood, RN BSN [facility] Liaison  [phone] business mobile phone Toll free office [phone]

## 2017-05-18 LAB — MAGNESIUM: Magnesium: 1.9 mg/dL (ref 1.7–2.4)

## 2017-05-18 LAB — BASIC METABOLIC PANEL
ANION GAP: 7 (ref 5–15)
ANION GAP: 9 (ref 5–15)
Anion gap: 9 (ref 5–15)
BUN: 43 mg/dL — AB (ref 6–20)
BUN: 40 mg/dL — ABNORMAL HIGH (ref 6–20)
BUN: 37 mg/dL — ABNORMAL HIGH (ref 6–20)
CALCIUM: 10.3 mg/dL (ref 8.9–10.3)
CHLORIDE: 122 mmol/L — AB (ref 101–111)
CHLORIDE: 120 mmol/L — AB (ref 101–111)
CO2: 22 mmol/L (ref 22–32)
CO2: 23 mmol/L (ref 22–32)
CO2: 21 mmol/L — AB (ref 22–32)
Calcium: 10.5 mg/dL — ABNORMAL HIGH (ref 8.9–10.3)
Calcium: 10.6 mg/dL — ABNORMAL HIGH (ref 8.9–10.3)
Chloride: 122 mmol/L — ABNORMAL HIGH (ref 101–111)
Creatinine, Ser: 1.1 mg/dL — ABNORMAL HIGH (ref 0.44–1.00)
Creatinine, Ser: 1.09 mg/dL — ABNORMAL HIGH (ref 0.44–1.00)
Creatinine, Ser: 0.97 mg/dL (ref 0.44–1.00)
GFR calc Af Amer: 55 mL/min — ABNORMAL LOW (ref >60)
GFR calc non Af Amer: 48 mL/min — ABNORMAL LOW (ref >60)
GFR calc non Af Amer: 48 mL/min — ABNORMAL LOW (ref >60)
GFR calc non Af Amer: 55 mL/min — ABNORMAL LOW (ref >60)
GFR, EST AFRICAN AMERICAN: 56 mL/min — AB (ref >60)
GLUCOSE: 95 mg/dL (ref 65–99)
Glucose, Bld: 145 mg/dL — ABNORMAL HIGH (ref 65–99)
Glucose, Bld: 132 mg/dL — ABNORMAL HIGH (ref 65–99)
POTASSIUM: 3.1 mmol/L — AB (ref 3.5–5.1)
POTASSIUM: 3 mmol/L — AB (ref 3.5–5.1)
Potassium: 2.8 mmol/L — ABNORMAL LOW (ref 3.5–5.1)
Sodium: 153 mmol/L — ABNORMAL HIGH (ref 135–145)
Sodium: 152 mmol/L — ABNORMAL HIGH (ref 135–145)
Sodium: 150 mmol/L — ABNORMAL HIGH (ref 135–145)

## 2017-05-18 LAB — RENAL FUNCTION PANEL
ANION GAP: 9 (ref 5–15)
Albumin: 2 g/dL — ABNORMAL LOW (ref 3.5–5.0)
BUN: 46 mg/dL — ABNORMAL HIGH (ref 6–20)
CALCIUM: 10.6 mg/dL — AB (ref 8.9–10.3)
CHLORIDE: 120 mmol/L — AB (ref 101–111)
CO2: 23 mmol/L (ref 22–32)
Creatinine, Ser: 1.08 mg/dL — ABNORMAL HIGH (ref 0.44–1.00)
GFR, EST AFRICAN AMERICAN: 56 mL/min — AB (ref >60)
GFR, EST NON AFRICAN AMERICAN: 49 mL/min — AB (ref >60)
Glucose, Bld: 89 mg/dL (ref 65–99)
Phosphorus: 1.4 mg/dL — ABNORMAL LOW (ref 2.5–4.6)
Potassium: 3.2 mmol/L — ABNORMAL LOW (ref 3.5–5.1)
SODIUM: 152 mmol/L — AB (ref 135–145)

## 2017-05-18 LAB — OSMOLALITY: Osmolality: 330 mOsm/kg (ref 275–295)

## 2017-05-18 LAB — CBC
HCT: 34.4 % — ABNORMAL LOW (ref 36.0–46.0)
HEMOGLOBIN: 10.1 g/dL — AB (ref 12.0–15.0)
MCH: 25.1 pg — ABNORMAL LOW (ref 26.0–34.0)
MCHC: 29.4 g/dL — ABNORMAL LOW (ref 30.0–36.0)
MCV: 85.6 fL (ref 78.0–100.0)
PLATELETS: 300 10*3/uL (ref 150–400)
RBC: 4.02 MIL/uL (ref 3.87–5.11)
RDW: 18.1 % — ABNORMAL HIGH (ref 11.5–15.5)
WBC: 9.8 10*3/uL (ref 4.0–10.5)

## 2017-05-18 MED ORDER — POTASSIUM PHOSPHATE MONOBASIC 500 MG PO TABS
500.0000 mg | ORAL_TABLET | Freq: Three times a day (TID) | ORAL | Status: DC
  Filled 2017-05-18: qty 1

## 2017-05-18 MED ORDER — K PHOS MONO-SOD PHOS DI & MONO 155-852-130 MG PO TABS
500.0000 mg | ORAL_TABLET | Freq: Three times a day (TID) | ORAL | Status: DC
  Administered 2017-05-18 (×4): 500 mg via ORAL
  Filled 2017-05-18 (×5): qty 2

## 2017-05-18 MED ORDER — DEXTROSE 5 % IV SOLN
INTRAVENOUS | Status: DC
  Administered 2017-05-18: 1000 mL via INTRAVENOUS
  Administered 2017-05-19 – 2017-05-20 (×3): via INTRAVENOUS

## 2017-05-18 NOTE — Progress Notes (Signed)
CRITICAL VALUE ALERT  Critical Value:  Osmolarity = 330  Date & Time Notied:  09:06  Provider Notified: Dr. Posey Pronto  Orders Received/Actions taken: new order for IV fluids

## 2017-05-18 NOTE — Progress Notes (Signed)
[facility] Progress Note  Patient: Katherine Cooper KZS:[id number]   PCP: Nolene Ebbs, MD DOB: [birth year]   DOA: 05/14/2017   DOS: 05/18/2017   Date of Service: the patient was seen and examined on 05/18/2017  Subjective: No acute events overnight. No acute complaints at present.  Brief hospital course: Pt. with PMH of dementia, HTN, CVA, obesity, CKD stage III, GERD; admitted on 05/14/2017, presented with complaint of confusion, was found to have UTI and CAP. Currently further plan is continued IV antibiotics.  Assessment and Plan: 1. ESBL UTI. Community-acquired pneumonia. Acute encephalopathy. Blood cultures are negative, 1 out of 2 positive for prognosis negative staph which is likely contamination. Urine is growing Klebsiella which is ESBL. Patient was on IV Zosyn, I changed to IV meropenem. Per my discussion with ID, we will finish at least 5 days of IV treatment and give her fosfomycin prior to discharge.  2. Acute on chronic kidney disease stage IV. Due to dehydration and infection. BUN more than 100, serum creatinine significantly elevated from baseline. Avoid nephrotoxic medication. Continue IV fluids. Renal function close to baseline.  3. Hypernatremia. in the setting of receiving IV normal saline. Osmolality also elevated. I will change the patient's fluids to D5 and monitor her BMP every 4 hours. Currently no significant confusion or deficit on examination. Expecting rapid improvement over next 24-48 hours.  4. Alzheimer's dementia. Continue home regimen.  5. Hypercalcemia. Calcium level getting better, continue IV hydration at present.  6.. Stage II pressure injury of the right foot and heel. Continue supportive measures.  7.  Essential hypertension. Continue home regimen at present.  8. Hypokalemia. Replacing. Rechecking tomorrow.  Diet: cardiac diet DVT Prophylaxis: subcutaneous Heparin  Advance goals of care discussion: full  code  Family Communication: no family was present at bedside, at the time of interview.  Disposition:  Discharge to SNF.  Consultants: none Procedures: none  Antibiotics: Anti-infectives    Start     Dose/Rate Route Frequency Ordered Stop   05/16/17 1000  meropenem (MERREM) 1 g in sodium chloride 0.9 % 100 mL IVPB     1 g 200 mL/hr over 30 Minutes Intravenous Every 12 hours 05/16/17 0744     05/14/17 2200  piperacillin-tazobactam (ZOSYN) IVPB 3.375 g  Status:  Discontinued     3.375 g 12.5 mL/hr over 240 Minutes Intravenous Every 8 hours 05/14/17 2020 05/16/17 0736   05/14/17 1715  cefTRIAXone (ROCEPHIN) 1 g in dextrose 5 % 50 mL IVPB     1 g 100 mL/hr over 30 Minutes Intravenous  Once 05/14/17 1700 05/14/17 1823       Objective: Physical Exam: Vitals:   05/17/17 1343 05/17/17 2223 05/18/17 0453 05/18/17 1418  BP: 112/76 112/73 98/67 103/70  Pulse: 97 (!) 105 (!) 107 (!) 107  Resp: 16 18 18    Temp: 98.9 F (37.2 C) 98.5 F (36.9 C) 98.8 F (37.1 C) 98.3 F (36.8 C)  TempSrc:  Oral  Oral  SpO2: 100% 100% 100% 100%  Weight:      Height:        Intake/Output Summary (Last 24 hours) at 05/18/17 1731 Last data filed at 05/18/17 1628  Gross per 24 hour  Intake          1415.42 ml  Output              450 ml  Net           965.42 ml   [employer]  05/14/17 1538 05/15/17 0545 05/17/17 0457  Weight: 90.7 kg (200 lb) 119 kg (262 lb 4.8 oz) 76.3 kg (168 lb 3.2 oz)   General: Alert, Awake and Oriented to Time, Place and Person. Appear in mild distress, affect appropriate Eyes: PERRL, Conjunctiva normal ENT: Oral Mucosa clear moist. Neck: difficult to assess JVD, no Abnormal Mass Or lumps Cardiovascular: S1 and S2 Present, no Murmur, Respiratory: Bilateral Air entry equal and Decreased, no use of accessory muscle, Clear to Auscultation, no Crackles, no wheezes Abdomen: Bowel Sound present, Soft and no tenderness Skin: no redness, no Rash, no  induration Extremities: no Pedal edema, no calf tenderness  Data Reviewed: CBC:  Recent Labs Lab 05/14/17 1530 05/15/17 0624 05/18/17 0501  WBC 9.9 9.7 9.8  NEUTROABS 7.4  --   --   HGB 10.9* 11.0* 10.1*  HCT 36.1 37.7 34.4*  MCV 82.8 83.8 85.6  PLT 344 360 572   Basic Metabolic Panel:  Recent Labs Lab 05/14/17 2115 05/15/17 0624 05/16/17 0442 05/18/17 0501 05/18/17 0802 05/18/17 1219  NA  --  140 143 152* 153* 152*  K  --  3.8 3.6 3.2* 3.1* 3.0*  CL  --  107 112* 120* 122* 122*  CO2  --  21* 21* 23 22 23   GLUCOSE  --  108* 133* 89 95 145*  BUN  --  116* 99* 46* 43* 40*  CREATININE  --  2.39* 1.91* 1.08* 1.10* 1.09*  CALCIUM  --  12.0* 11.4* 10.6* 10.5* 10.6*  MG 3.0*  --   --   --  1.9  --   PHOS 4.8*  --   --  1.4*  --   --     Liver Function Tests:  Recent Labs Lab 05/14/17 1530 05/15/17 0624 05/18/17 0501  AST 21 23  --   ALT 14 15  --   ALKPHOS 73 75  --   BILITOT 0.7 0.4  --   PROT 6.6 6.9  --   ALBUMIN 2.4* 2.3* 2.0*   No results for input(s): LIPASE, AMYLASE in the last 168 hours. No results for input(s): AMMONIA in the last 168 hours. Coagulation Profile: No results for input(s): INR, PROTIME in the last 168 hours. Cardiac Enzymes: No results for input(s): CKTOTAL, CKMB, CKMBINDEX, TROPONINI in the last 168 hours. BNP (last 3 results) No results for input(s): PROBNP in the last 8760 hours. CBG: No results for input(s): GLUCAP in the last 168 hours. Studies: No results found.  Scheduled Meds: . aspirin EC  81 mg Oral Daily  . Chlorhexidine Gluconate Cloth  6 each Topical Q0600  . clotrimazole   Topical BID  . donepezil  10 mg Oral QHS  . feeding supplement (PRO-STAT SUGAR FREE 64)  30 mL Oral TID BM  . fluticasone  2 spray Each Nare Daily  . heparin  5,000 Units Subcutaneous Q8H  . loratadine  10 mg Oral Daily  . mouth rinse  15 mL Mouth Rinse BID  . memantine  28 mg Oral BH-q7a  . mirtazapine  15 mg Oral QHS  . multivitamin with  minerals  1 tablet Oral Daily  . mupirocin ointment  1 application Nasal BID  . pantoprazole  40 mg Oral Daily  . phosphorus  500 mg Oral TID WC & HS  . senna-docusate  2 tablet Oral QHS  . simvastatin  10 mg Oral QHS   Continuous Infusions: . dextrose 1,000 mL (05/18/17 1022)  . meropenem (MERREM) IV Stopped (05/18/17  1053)   PRN Meds: acetaminophen, HYDROcodone-acetaminophen, ondansetron **OR** ondansetron (ZOFRAN) IV  Time spent: 35 minutes  Author: Berle Mull, MD [facility] Pager: [phone] 05/18/2017 5:31 PM  If 7PM-7AM, please contact night-coverage at [email or web address], password [facility]

## 2017-05-19 ENCOUNTER — Inpatient Hospital Stay (HOSPITAL_COMMUNITY): Payer: Medicare Other

## 2017-05-19 DIAGNOSIS — Z515 Encounter for palliative care: Secondary | ICD-10-CM

## 2017-05-19 LAB — RENAL FUNCTION PANEL
ALBUMIN: 2 g/dL — AB (ref 3.5–5.0)
ANION GAP: 11 (ref 5–15)
BUN: 33 mg/dL — ABNORMAL HIGH (ref 6–20)
CO2: 21 mmol/L — ABNORMAL LOW (ref 22–32)
Calcium: 10.1 mg/dL (ref 8.9–10.3)
Chloride: 117 mmol/L — ABNORMAL HIGH (ref 101–111)
Creatinine, Ser: 0.96 mg/dL (ref 0.44–1.00)
GFR, EST NON AFRICAN AMERICAN: 56 mL/min — AB (ref >60)
Glucose, Bld: 125 mg/dL — ABNORMAL HIGH (ref 65–99)
PHOSPHORUS: 2.1 mg/dL — AB (ref 2.5–4.6)
POTASSIUM: 2.9 mmol/L — AB (ref 3.5–5.1)
SODIUM: 149 mmol/L — AB (ref 135–145)

## 2017-05-19 LAB — BASIC METABOLIC PANEL
ANION GAP: 9 (ref 5–15)
ANION GAP: 6 (ref 5–15)
Anion gap: 9 (ref 5–15)
BUN: 31 mg/dL — ABNORMAL HIGH (ref 6–20)
BUN: 33 mg/dL — ABNORMAL HIGH (ref 6–20)
BUN: 36 mg/dL — ABNORMAL HIGH (ref 6–20)
CHLORIDE: 119 mmol/L — AB (ref 101–111)
CHLORIDE: 118 mmol/L — AB (ref 101–111)
CO2: 22 mmol/L (ref 22–32)
CO2: 22 mmol/L (ref 22–32)
CO2: 23 mmol/L (ref 22–32)
CREATININE: 0.94 mg/dL (ref 0.44–1.00)
CREATININE: 0.98 mg/dL (ref 0.44–1.00)
CREATININE: 1 mg/dL (ref 0.44–1.00)
Calcium: 10.2 mg/dL (ref 8.9–10.3)
Calcium: 10.2 mg/dL (ref 8.9–10.3)
Calcium: 9.9 mg/dL (ref 8.9–10.3)
Chloride: 116 mmol/L — ABNORMAL HIGH (ref 101–111)
GFR calc Af Amer: >60 mL/min (ref >60)
GFR calc non Af Amer: 53 mL/min — ABNORMAL LOW (ref >60)
GFR calc non Af Amer: 55 mL/min — ABNORMAL LOW (ref >60)
GFR calc non Af Amer: 57 mL/min — ABNORMAL LOW (ref >60)
Glucose, Bld: 123 mg/dL — ABNORMAL HIGH (ref 65–99)
Glucose, Bld: 126 mg/dL — ABNORMAL HIGH (ref 65–99)
Glucose, Bld: 129 mg/dL — ABNORMAL HIGH (ref 65–99)
POTASSIUM: 2.7 mmol/L — AB (ref 3.5–5.1)
POTASSIUM: 2.9 mmol/L — AB (ref 3.5–5.1)
Potassium: 3.1 mmol/L — ABNORMAL LOW (ref 3.5–5.1)
SODIUM: 145 mmol/L (ref 135–145)
SODIUM: 149 mmol/L — AB (ref 135–145)
SODIUM: 150 mmol/L — AB (ref 135–145)

## 2017-05-19 LAB — CBC
HCT: 33.6 % — ABNORMAL LOW (ref 36.0–46.0)
HEMOGLOBIN: 9.6 g/dL — AB (ref 12.0–15.0)
MCH: 24.5 pg — ABNORMAL LOW (ref 26.0–34.0)
MCHC: 28.6 g/dL — AB (ref 30.0–36.0)
MCV: 85.7 fL (ref 78.0–100.0)
Platelets: 259 10*3/uL (ref 150–400)
RBC: 3.92 MIL/uL (ref 3.87–5.11)
RDW: 18.4 % — ABNORMAL HIGH (ref 11.5–15.5)
WBC: 8.9 10*3/uL (ref 4.0–10.5)

## 2017-05-19 LAB — CULTURE, BLOOD (ROUTINE X 2)
CULTURE: NO GROWTH
Special Requests: ADEQUATE

## 2017-05-19 LAB — MAGNESIUM: MAGNESIUM: 1.6 mg/dL — AB (ref 1.7–2.4)

## 2017-05-19 MED ORDER — POTASSIUM CHLORIDE CRYS ER 20 MEQ PO TBCR
40.0000 meq | EXTENDED_RELEASE_TABLET | Freq: Once | ORAL | Status: DC
  Filled 2017-05-19: qty 2

## 2017-05-19 MED ORDER — MAGNESIUM SULFATE 2 GM/50ML IV SOLN
2.0000 g | Freq: Once | INTRAVENOUS | Status: AC
  Administered 2017-05-19: 2 g via INTRAVENOUS
  Filled 2017-05-19: qty 50

## 2017-05-19 MED ORDER — POTASSIUM CHLORIDE CRYS ER 20 MEQ PO TBCR
40.0000 meq | EXTENDED_RELEASE_TABLET | Freq: Once | ORAL | Status: AC
  Administered 2017-05-19: 40 meq via ORAL
  Filled 2017-05-19: qty 2

## 2017-05-19 MED ORDER — POLYETHYLENE GLYCOL 3350 17 G PO PACK
17.0000 g | PACK | Freq: Every day | ORAL | Status: DC
  Administered 2017-05-21: 17 g via ORAL
  Filled 2017-05-19: qty 1

## 2017-05-19 MED ORDER — MEGESTROL ACETATE 400 MG/10ML PO SUSP
400.0000 mg | Freq: Every day | ORAL | Status: DC
  Administered 2017-05-21 – 2017-05-22 (×2): 400 mg via ORAL
  Filled 2017-05-19 (×4): qty 10

## 2017-05-19 MED ORDER — POTASSIUM CHLORIDE 10 MEQ/100ML IV SOLN
10.0000 meq | INTRAVENOUS | Status: AC
  Administered 2017-05-19 (×3): 10 meq via INTRAVENOUS
  Filled 2017-05-19 (×3): qty 100

## 2017-05-19 MED ORDER — POTASSIUM PHOSPHATES 15 MMOLE/5ML IV SOLN
10.0000 meq | Freq: Once | INTRAVENOUS | Status: AC
  Administered 2017-05-19: 10 meq via INTRAVENOUS
  Filled 2017-05-19: qty 2.27

## 2017-05-19 NOTE — Progress Notes (Addendum)
[facility] Progress Note  Patient: Katherine Cooper CHE:[id number]   PCP: Nolene Ebbs, MD DOB: 1941/05/09   DOA: 05/14/2017   DOS: 05/19/2017   Date of Service: the patient was seen and examined on 05/19/2017  Subjective: No acute events overnight. No acute complaints at present.  Brief hospital course: Pt. with PMH of dementia, HTN, CVA, obesity, CKD stage III, GERD; admitted on 05/14/2017, presented with complaint of confusion, was found to have UTI and CAP. Currently further plan is continued IV antibiotics.  Assessment and Plan: 1. ESBL UTI. Community-acquired pneumonia. Acute encephalopathy. Blood cultures are negative, 1 out of 2 positive for coag negative staph which is likely contamination. Urine is growing Klebsiella which is ESBL. Patient was on IV Zosyn, I changed to IV meropenem. Per my discussion with ID, we will finish at least 5 days of IV treatment and give her fosfomycin prior to discharge.  2. Acute on chronic kidney disease stage IV. Due to dehydration and infection. BUN more than 100, serum creatinine significantly elevated from baseline. Avoid nephrotoxic medication. Continue IV fluids. Renal function close to baseline.  3. Hypernatremia. in the setting of receiving IV normal saline. Osmolality also elevated. Started on D5, now getting better. Continue D5.  4. Alzheimer's dementia. Continue home regimen.  5. Hypercalcemia. Calcium level getting better, continue IV hydration at present.  6.. Stage II pressure injury of the right foot and heel. Continue supportive measures.  7.  Essential hypertension. Continue home regimen at present.  8. Hypokalemia. Replacing. Rechecking tomorrow.  9. Poor by mouth intake. Patient has not been taking any of her medications as well as any significant meals by mouth. Patient primarily presented with acute kidney injury likely from dehydration. At present her sodium and potassium appears to be also  getting better. No nausea or vomiting has been reported. X-ray abdomen shows moderate stool in the rectum I would start her on bowel regimen. I would also start her on Megace for appetite stimulation. At present my concern is this is progression of patient's dementia and patient would continue to do poorly from here onwards. Palliative care consulted for further help.  Diet: cardiac diet DVT Prophylaxis: subcutaneous Heparin  Advance goals of care discussion: full code, I had extensive discussion with patient's daughter who was at bedside. Patient with poor appetite and presentation with acute kidney injury secondary to dehydration. At present I'm concerned that this will continue to get worse. Family had questions regarding use of alternative means to provide nutrition to the patient as well as medication. At present while NG tube or a PEG tube can temporarily provide medication as well as nutrition, in a patient with advanced dementia and history of aspiration, in the long run this would not be an optimal choice. Palliative care consulted for further input.  Family Communication: family was present at bedside, at the time of interview.  Disposition:  Discharge to SNF.  Consultants:  palliative care Procedures: none  Antibiotics: Anti-infectives    Start     Dose/Rate Route Frequency Ordered Stop   05/16/17 1000  meropenem (MERREM) 1 g in sodium chloride 0.9 % 100 mL IVPB     1 g 200 mL/hr over 30 Minutes Intravenous Every 12 hours 05/16/17 0744     05/14/17 2200  piperacillin-tazobactam (ZOSYN) IVPB 3.375 g  Status:  Discontinued     3.375 g 12.5 mL/hr over 240 Minutes Intravenous Every 8 hours 05/14/17 2020 05/16/17 0736   05/14/17 1715  cefTRIAXone (ROCEPHIN) 1 g  in dextrose 5 % 50 mL IVPB     1 g 100 mL/hr over 30 Minutes Intravenous  Once 05/14/17 1700 05/14/17 1823       Objective: Physical Exam: Vitals:   05/18/17 2138 05/19/17 0434 05/19/17 0553 05/19/17 1618  BP:  106/73  112/80 104/80  Pulse: (!) 106  (!) 110 (!) 105  Resp: 18  18 20   Temp: 98.6 F (37 C)  97.8 F (36.6 C) 98.2 F (36.8 C)  TempSrc:      SpO2: 100%  100%   Weight:  76.9 kg (169 lb 8 oz)    Height:        Intake/Output Summary (Last 24 hours) at 05/19/17 1717 Last data filed at 05/19/17 1607  Gross per 24 hour  Intake           3405.6 ml  Output                0 ml  Net           3405.6 ml   Filed Weights   05/15/17 0545 05/17/17 0457 05/19/17 0434  Weight: 119 kg (262 lb 4.8 oz) 76.3 kg (168 lb 3.2 oz) 76.9 kg (169 lb 8 oz)   General: Alert, Awake and Oriented to Time, Place and Person. Appear in mild distress, affect appropriate Eyes: PERRL, Conjunctiva normal ENT: Oral Mucosa clear moist. Neck: difficult to assess JVD, no Abnormal Mass Or lumps, patient with right side  Neck preference chronically per family Cardiovascular: S1 and S2 Present, no Murmur, Peripheral Pulses Present Respiratory: Bilateral Air entry equal and Decreased, no use of accessory muscle, Clear to Auscultation, no Crackles, no wheezes Abdomen: Bowel Sound present, Soft and no tenderness Skin: redness no, no Rash, no induration Extremities: no Pedal edema, no calf tenderness Neurologic: Grossly no focal neuro deficit. Bilaterally Equal motor strength other then generalized weakness Data Reviewed: CBC:  Recent Labs Lab 05/14/17 1530 05/15/17 0624 05/18/17 0501 05/19/17 0558  WBC 9.9 9.7 9.8 8.9  NEUTROABS 7.4  --   --   --   HGB 10.9* 11.0* 10.1* 9.6*  HCT 36.1 37.7 34.4* 33.6*  MCV 82.8 83.8 85.6 85.7  PLT 344 360 300 283   Basic Metabolic Panel:  Recent Labs Lab 05/14/17 2115  05/18/17 0501 05/18/17 0802  05/18/17 1754 05/18/17 2342 05/19/17 0551 05/19/17 0558 05/19/17 1126  NA  --   < > 152* 153*  < > 150* 150* 149* 149* 145  K  --   < > 3.2* 3.1*  < > 2.8* 2.7* 2.9* 2.9* 3.1*  CL  --   < > 120* 122*  < > 120* 119* 117* 118* 116*  CO2  --   < > 23 22  < > 21* 22 21* 22  23  GLUCOSE  --   < > 89 95  < > 132* 129* 125* 126* 123*  BUN  --   < > 46* 43*  < > 37* 36* 33* 33* 31*  CREATININE  --   < > 1.08* 1.10*  < > 0.97 0.98 0.96 1.00 0.94  CALCIUM  --   < > 10.6* 10.5*  < > 10.3 10.2 10.1 10.2 9.9  MG 3.0*  --   --  1.9  --   --   --   --  1.6*  --   PHOS 4.8*  --  1.4*  --   --   --   --  2.1*  --   --   < > =  values in this interval not displayed.  Liver Function Tests:  Recent Labs Lab 05/14/17 1530 05/15/17 0624 05/18/17 0501 05/19/17 0551  AST 21 23  --   --   ALT 14 15  --   --   ALKPHOS 73 75  --   --   BILITOT 0.7 0.4  --   --   PROT 6.6 6.9  --   --   ALBUMIN 2.4* 2.3* 2.0* 2.0*   No results for input(s): LIPASE, AMYLASE in the last 168 hours. No results for input(s): AMMONIA in the last 168 hours. Coagulation Profile: No results for input(s): INR, PROTIME in the last 168 hours. Cardiac Enzymes: No results for input(s): CKTOTAL, CKMB, CKMBINDEX, TROPONINI in the last 168 hours. BNP (last 3 results) No results for input(s): PROBNP in the last 8760 hours. CBG: No results for input(s): GLUCAP in the last 168 hours. Studies: Dg Abd Portable 1v  Result Date: 05/19/2017 CLINICAL DATA:  Constipation EXAM: PORTABLE ABDOMEN - 1 VIEW COMPARISON:  CT abdomen/pelvis dated 09/16/2010 FINDINGS: Nonobstructive bowel gas pattern. Moderate stool in the rectum. Otherwise normal colonic stool burden. Soft tissue calcifications overlying the pelvis. IMPRESSION: Nonobstructive bowel gas pattern. Moderate stool in the rectum. Electronically Signed   By: Julian Hy M.D.   On: 05/19/2017 14:52    Scheduled Meds: . aspirin EC  81 mg Oral Daily  . clotrimazole   Topical BID  . donepezil  10 mg Oral QHS  . feeding supplement (PRO-STAT SUGAR FREE 64)  30 mL Oral TID BM  . fluticasone  2 spray Each Nare Daily  . heparin  5,000 Units Subcutaneous Q8H  . loratadine  10 mg Oral Daily  . mouth rinse  15 mL Mouth Rinse BID  . megestrol  400 mg Oral Daily   . memantine  28 mg Oral BH-q7a  . mirtazapine  15 mg Oral QHS  . multivitamin with minerals  1 tablet Oral Daily  . mupirocin ointment  1 application Nasal BID  . pantoprazole  40 mg Oral Daily  . potassium chloride  40 mEq Oral Once  . senna-docusate  2 tablet Oral QHS   Continuous Infusions: . dextrose 100 mL/hr at 05/19/17 1257  . meropenem (MERREM) IV Stopped (05/19/17 1147)   PRN Meds: acetaminophen, HYDROcodone-acetaminophen, ondansetron **OR** ondansetron (ZOFRAN) IV  Time spent: 35 minutes  Author: Berle Mull, MD [facility] Pager: [phone] 05/19/2017 5:17 PM  If 7PM-7AM, please contact night-coverage at [email or web address], password [facility]

## 2017-05-19 NOTE — Progress Notes (Signed)
Patient would not take morning meds or ordered potassium. Joslyn Hy, MSN, RN, [employer]

## 2017-05-19 NOTE — Progress Notes (Signed)
Notified Dr Posey Pronto about patient not taking meds and the need to possibly replete potassium through IV. Joslyn Hy, MSN, RN, [employer]

## 2017-05-19 NOTE — Progress Notes (Signed)
  Pharmacy Antibiotic Note  Katherine Cooper is a 76 y.o. female admitted on 05/14/2017 with multiple medical issues, now w/ urine Cx showing ESBL infection.  Pharmacy has been consulted for Merrem dosing.   The patient continues on Meropenem D#4/5 as intended per discussion with ID. Planning fosfomycin x 1 prior to discharge. SCr 1, CrCl~40-50 ml/min. Dose remains appropriate.   Plan: 1. Continue Meropenem 1g IV every 12 hours 2. Will continue to follow renal function, culture results, LOT, and antibiotic de-escalation plans   Height: 5\' 4"  (162.6 cm) Weight: 169 lb 8 oz (76.9 kg) IBW/kg (Calculated) : 54.7  Temp (24hrs), Avg:98.2 F (36.8 C), Min:97.8 F (36.6 C), Max:98.6 F (37 C)   Recent Labs Lab 05/14/17 1530 05/14/17 1540 05/15/17 0624  05/18/17 0501  05/18/17 1219 05/18/17 1754 05/18/17 2342 05/19/17 0551 05/19/17 0558  WBC 9.9  --  9.7  --  9.8  --   --   --   --   --  8.9  CREATININE 2.74*  --  2.39*  < > 1.08*  < > 1.09* 0.97 0.98 0.96 1.00  LATICACIDVEN  --  1.18  --   --   --   --   --   --   --   --   --   < > = values in this interval not displayed.  Estimated Creatinine Clearance: 48.1 mL/min (by C-G formula based on SCr of 1 mg/dL).    Allergies  Allergen Reactions  . Sulfa Antibiotics Itching    5/21 MRSA PCR + 5/21 BCx x 2 >> 1/4 staph 5/22 BCID w/ 1 of 4 bottles + for CNS - spoke with Dr. Dyann Kief - only contaminant 5/21 urine ESBL Kleb PNA (S only to imipenem)  Zosyn 5/21 >> 5/23 Merrem 5/23 >>   Thank you for allowing pharmacy to be a part of this patient's care.  Alycia Rossetti, PharmD, BCPS Clinical Pharmacist Pager: [phone] Clinical phone for 05/19/2017 from 7a-3:30p: [phone] If after 3:30p, please call main pharmacy at: [phone] 05/19/2017 10:55 AM

## 2017-05-19 NOTE — Progress Notes (Signed)
CRITICAL VALUE ALERT  Critical Value:  Potassium 2.7  Date & Time Notied:  05/19/17 at 0035  Provider Notified: T.opyd  Orders Received/Actions taken: dr orders for oral and iv potassium

## 2017-05-19 NOTE — Progress Notes (Signed)
Consult received. Chart reviewed as well as staffing with Dr. Posey Pronto. Went to see pt. She is unable to participate in discussion. No family at the bedside. LM with dtr Gerlean Ren to arrange a time for [place] discussion.  Romona Curls, ANP-ACHPN

## 2017-05-20 LAB — CBC
HEMATOCRIT: 36 % (ref 36.0–46.0)
HEMOGLOBIN: 10.8 g/dL — AB (ref 12.0–15.0)
MCH: 25.2 pg — ABNORMAL LOW (ref 26.0–34.0)
MCHC: 30 g/dL (ref 30.0–36.0)
MCV: 84.1 fL (ref 78.0–100.0)
Platelets: 238 10*3/uL (ref 150–400)
RBC: 4.28 MIL/uL (ref 3.87–5.11)
RDW: 18.3 % — ABNORMAL HIGH (ref 11.5–15.5)
WBC: 8.9 10*3/uL (ref 4.0–10.5)

## 2017-05-20 LAB — RENAL FUNCTION PANEL
ALBUMIN: 2.1 g/dL — AB (ref 3.5–5.0)
ANION GAP: 9 (ref 5–15)
BUN: 22 mg/dL — ABNORMAL HIGH (ref 6–20)
CALCIUM: 10 mg/dL (ref 8.9–10.3)
CO2: 20 mmol/L — ABNORMAL LOW (ref 22–32)
Chloride: 111 mmol/L (ref 101–111)
Creatinine, Ser: 0.88 mg/dL (ref 0.44–1.00)
GFR calc non Af Amer: >60 mL/min (ref >60)
Glucose, Bld: 115 mg/dL — ABNORMAL HIGH (ref 65–99)
PHOSPHORUS: 1.5 mg/dL — AB (ref 2.5–4.6)
POTASSIUM: 3.2 mmol/L — AB (ref 3.5–5.1)
SODIUM: 140 mmol/L (ref 135–145)

## 2017-05-20 MED ORDER — POTASSIUM PHOSPHATE MONOBASIC 500 MG PO TABS
500.0000 mg | ORAL_TABLET | Freq: Three times a day (TID) | ORAL | Status: DC
  Filled 2017-05-20 (×3): qty 1

## 2017-05-20 MED ORDER — FOSFOMYCIN TROMETHAMINE 3 G PO PACK
3.0000 g | PACK | Freq: Once | ORAL | Status: AC
  Administered 2017-05-21: 3 g via ORAL
  Filled 2017-05-20: qty 3

## 2017-05-20 MED ORDER — K PHOS MONO-SOD PHOS DI & MONO 155-852-130 MG PO TABS
500.0000 mg | ORAL_TABLET | Freq: Three times a day (TID) | ORAL | Status: DC
  Administered 2017-05-20 – 2017-05-24 (×13): 500 mg via ORAL
  Filled 2017-05-20 (×17): qty 2

## 2017-05-20 MED ORDER — WHITE PETROLATUM GEL
Status: AC
  Administered 2017-05-20: 13:00:00
  Filled 2017-05-20: qty 1

## 2017-05-20 MED ORDER — FOSFOMYCIN TROMETHAMINE 3 G PO PACK
3.0000 g | PACK | Freq: Once | ORAL | Status: DC
  Filled 2017-05-20: qty 3

## 2017-05-20 NOTE — Consult Note (Signed)
Consultation Note Date: 05/20/2017   Patient Name: Katherine Cooper  DOB: February 22, 1941  MRN: [id number]  Age / Sex: 76 y.o., female  PCP: Nolene Ebbs, MD Referring Physician: Lavina Hamman, MD  Reason for Consultation: Establishing goals of care and Psychosocial/spiritual support  HPI/Patient Profile: 77 y.o. female  with past medical history of Dementia, hypertension, CVA, chronic kidney disease stage III, GERD admitted on 05/14/2017 with confusion, was found to have UTI and pneumonia.  Patient has been started on antibiotics and IV fluids. She is bedbound at baseline. She's had a fluctuating level of consciousness as well as oral intake which has been very concerning to family per chart review..   Clinical Assessment and Goals of Care: Met with patient today. No family at the bedside. I did reach out to patient's daughter, Mrs. Bonnita Levan, to arrange a family meeting, left message. Patient does not initiate conversation but when asked a question will answer and does appear to be answering relevantly. She did request coffee this morning. Yesterday, she would not take her medications before consume anything to eat. She is taking sips of liquid only. When I saw her this morning and spoke about specific issues, such as pain shortness of breath anxiety etc. she did verbalize that her left ankle hurt. I was able to elicit pain with just light touch and movement. Both her lower extremities are cold but her left is colder than her right; both with diminished pedal pulses Chart reviewed, and no pain medicine has been administered to patient  Patient has dementia and healthcare proxy at this point is presumed to be her children of which we have 2 daughters and 1 son listed. I have left a message with first initial contact, Gerlean Ren to arrange a goals of care meeting. Awaiting a return call.    SUMMARY OF RECOMMENDATIONS    Patient continues to be a full code by default. This is a goals of care decision that we would like to address with family to ensure there our care is in alignment With patient's goals Question whether patient's withdrawal seen over the past 24-48 hours, as evidenced by limited verbalizations, no by mouth intake could be unmanaged pain. Family expressing concern per chart review, the patient has not been eating or drinking or taking her oral medications and is questioning alternate route presumably a feeding tube to address this problem. Palliative medicine team to address PEG tube in addition to CODE STATUS and other issues relevant to patient and patient's family as part of goals of care discussion Continue to try to reach family. Code Status/Advance Care Planning:  Full code    Symptom Management:   Pain: Patient complaining of left ankle pain that is easily replicated with light touch and movement. Patient given Vicodin which is ordered twice daily as needed. Monitor for need for scheduled dosing. Continue to assess whether unmanaged pain is factoring into patient's withdrawal, decreased oral intake  Palliative Prophylaxis:   Aspiration, Bowel Regimen, Delirium Protocol, Eye Care, Frequent Pain Assessment,  Oral Care and Turn Reposition  Additional Recommendations (Limitations, Scope, Preferences):  Full Scope Treatment  Psycho-social/Spiritual:   Desire for further Chaplaincy support:no   Prognosis:   Unable to determine  Discharge Planning: [place] for rehab with Palliative care service follow-up      Primary Diagnoses: Present on Admission: . Acute encephalopathy . Obesity (BMI 30-39.9) . Essential hypertension . CKD (chronic kidney disease) stage 4, GFR 15-29 ml/min (HCC) . Alzheimer's dementia, late onset, with behavioral disturbance . Acute renal failure (ARF) ([place]) . Dehydration . Hypercalcemia   I have reviewed the medical record,  interviewed the patient and family, and examined the patient. The following aspects are pertinent.  Past Medical History:  Diagnosis Date  . Alzheimer disease   . Alzheimer's dementia, late onset, with behavioral disturbance 02/06/2017  . Anemia   . Angina   . Anxiety   . Arthritis   . Blood transfusion   . Breast cancer ([place]) 10/22/2012  . CAP (community acquired pneumonia)   . Chronic kidney disease   . DDD (degenerative disc disease), lumbar   . Dementia   . Dysrhythmia   . Gout   . Headache(784.0)   . Heart murmur   . Hypertension   . Hypothyroidism   . Obesity   . Recurrent upper respiratory infection (URI)   . Shortness of breath 05/26/2013  . Stroke [facility])    Social History   Social History  . Marital status: Widowed    Spouse name: N/A  . Number of children: N/A  . Years of education: N/A   Social History Main Topics  . Smoking status: Former Smoker    Packs/day: 0.25    Types: Cigarettes    Quit date: 03/13/2012  . Smokeless tobacco: Never Used  . Alcohol use No  . Drug use: No  . Sexual activity: No   Other Topics Concern  . None   Social History Narrative  . None   Family History  Problem Relation Age of Onset  . Asthma Mother   . Heart attack Father   . Kidney failure Brother    Scheduled Meds: . aspirin EC  81 mg Oral Daily  . clotrimazole   Topical BID  . donepezil  10 mg Oral QHS  . feeding supplement (PRO-STAT SUGAR FREE 64)  30 mL Oral TID BM  . fluticasone  2 spray Each Nare Daily  . heparin  5,000 Units Subcutaneous Q8H  . loratadine  10 mg Oral Daily  . mouth rinse  15 mL Mouth Rinse BID  . megestrol  400 mg Oral Daily  . memantine  28 mg Oral BH-q7a  . mirtazapine  15 mg Oral QHS  . multivitamin with minerals  1 tablet Oral Daily  . pantoprazole  40 mg Oral Daily  . polyethylene glycol  17 g Oral Daily  . potassium phosphate (monobasic)  500 mg Oral TID WC & HS  . senna-docusate  2 tablet Oral QHS   Continuous Infusions: .  meropenem (MERREM) IV Stopped (05/20/17 0056)   PRN Meds:.acetaminophen, HYDROcodone-acetaminophen, ondansetron **OR** ondansetron (ZOFRAN) IV Medications Prior to Admission:  Prior to Admission medications   Medication Sig Start Date End Date Taking? Authorizing Provider  acetaminophen (TYLENOL) 325 MG tablet Take 650 mg by mouth every 8 (eight) hours. 0000, 0800, 1600   Yes [provider]  acetaminophen (TYLENOL) 500 MG tablet Take 500 mg by mouth every 8 (eight) hours as needed for mild pain.   Yes [provider]  aspirin EC 81 MG tablet Take 81 mg by mouth daily.   Yes [provider]  carvedilol (COREG) 3.125 MG tablet Take 3.125 mg by mouth 2 (two) times daily with a meal.   Yes [provider]  cetirizine (ZYRTEC) 10 MG tablet Take 10 mg by mouth daily.    Yes [provider]  clorazepate (TRANXENE) 7.5 MG tablet Take 7.5 mg by mouth 3 (three) times daily.    Yes [provider]  donepezil (ARICEPT) 10 MG tablet Take 10 mg by mouth at bedtime.    Yes [provider]  fluticasone (FLONASE) 50 MCG/ACT nasal spray Place 2 sprays into both nostrils daily.    Yes [provider]  furosemide (LASIX) 40 MG tablet Take 40 mg by mouth daily.   Yes [provider]  HYDROcodone-acetaminophen (NORCO/VICODIN) 5-325 MG tablet Take 1 tablet by mouth every 8 (eight) hours as needed for moderate pain. 02/09/17  Yes Sheikh, Omair Latif, DO  Memantine HCl ER (NAMENDA XR) 28 MG CP24 Take 1 capsule by mouth every morning.    Yes [provider]  mirtazapine (REMERON) 15 MG tablet Take 15 mg by mouth at bedtime.   Yes [provider]  Multiple Vitamin (MULTIVITAMIN WITH MINERALS) TABS tablet Take 1 tablet by mouth daily.   Yes [provider]  oxybutynin (DITROPAN-XL) 5 MG 24 hr tablet Take 5 mg by mouth daily. 02/02/17  Yes [provider]  pantoprazole (PROTONIX) 40 MG tablet Take 40 mg by mouth  daily.    Yes [provider]  phenylephrine-shark liver oil-mineral oil-petrolatum (PREPARATION H) 0.25-3-14-71.9 % rectal ointment Place 1 application rectally 2 (two) times daily as needed for hemorrhoids.   Yes [provider]  PROMETHAZINE HCL IJ Inject 12.5 mg into the muscle every 6 (six) hours as needed (nausea/vomiting).   Yes [provider]  senna-docusate (SENOKOT-S) 8.6-50 MG tablet Take 2 tablets by mouth at bedtime.   Yes [provider]  simvastatin (ZOCOR) 10 MG tablet Take 10 mg by mouth at bedtime.   Yes [provider]  triamterene-hydrochlorothiazide (MAXZIDE-25) 37.5-25 MG tablet Take 1 tablet by mouth daily.   Yes [provider]   Allergies  Allergen Reactions  . Sulfa Antibiotics Itching   Review of Systems  Unable to perform ROS: Dementia    Physical Exam  Constitutional: She appears well-developed and well-nourished.  Older female, in no acute distress but vocalizing pain to her left ankle  Cardiovascular:  Diminished pedal pulses  Pulmonary/Chest: Effort normal.  Abdominal: Soft.  Skin:  Lower extremities are cool left greater than right   Psychiatric:  Affect flat We will converse to a limited degree and can verbalize distress  Nursing note and vitals reviewed.   Vital Signs: BP 100/76 (BP Location: Left Wrist)   Pulse (!) 115   Temp 97.5 F (36.4 C) (Oral)   Resp 18   Ht 5' 4" (1.626 m)   Wt 79 kg (174 lb 3.2 oz)   SpO2 100%   BMI 29.90 kg/m  Pain Assessment: No/denies pain   Pain Score: 0-No pain   SpO2: SpO2: 100 % O2 Device:SpO2: 100 % O2 Flow Rate: .O2 Flow Rate (L/min): 4 L/min  IO: Intake/output summary:  Intake/Output Summary (Last 24 hours) at 05/20/17 1133 Last data filed at 05/20/17 0747  Gross per 24 hour  Intake          2302.08 ml  Output  400 ml  Net          1902.08 ml    LBM: Last BM Date: 05/19/17 Baseline Weight: Weight: 90.7 kg (200 lb) Most  recent weight: Weight: 79 kg (174 lb 3.2 oz)     Palliative Assessment/Data:   Flowsheet Rows     Most Recent Value  Intake Tab  Referral Department  Hospitalist  Unit at Time of Referral  Med/Surg Unit  Palliative Care Primary Diagnosis  Sepsis/Infectious Disease  Date Notified  05/19/17  Palliative Care Type  New Palliative care  Reason for referral  Clarify Goals of Care  Date of Admission  05/14/17  Date first seen by Palliative Care  05/20/17  # of days Palliative referral response time  1 Day(s)  # of days IP prior to Palliative referral  5  Clinical Assessment  Palliative Performance Scale Score  30%  Pain Max last 24 hours  Not able to report  Pain Min Last 24 hours  Not able to report  Dyspnea Max Last 24 Hours  Not able to report  Dyspnea Min Last 24 hours  Not able to report  Nausea Max Last 24 Hours  Not able to report  Nausea Min Last 24 Hours  Not able to report  Anxiety Max Last 24 Hours  Not able to report  Anxiety Min Last 24 Hours  Not able to report  Other Max Last 24 Hours  Not able to report  Psychosocial & Spiritual Assessment  Palliative Care Outcomes  Patient/Family meeting held?  No  Palliative Care Outcomes  Improved pain interventions  Palliative Care follow-up planned  Yes, Facility      Time In: 0800 Time Out: 0855 Time Total: 55 min Greater than 50%  of this time was spent counseling and coordinating care related to the above assessment and plan.Staffed with bedside RN, and Dr. Posey Pronto Signed by: Dory Horn, NP   Please contact Palliative Medicine Team phone at [phone] for questions and concerns.  For individual provider: See Shea Evans

## 2017-05-20 NOTE — Progress Notes (Signed)
[facility] Progress Note  Patient: Katherine Cooper ERX:[id number]   PCP: Nolene Ebbs, MD DOB: 1941/02/24   DOA: 05/14/2017   DOS: 05/20/2017   Date of Service: the patient was seen and examined on 05/20/2017  Subjective: Complains about pain in her ankle today. No nausea no vomiting. Still remains constipated. Oral intake is improving today.  Brief hospital course: Pt. with PMH of dementia, HTN, CVA, obesity, CKD stage III, GERD; admitted on 05/14/2017, presented with complaint of confusion, was found to have UTI and CAP. Currently further plan is complete IV antibiotics.  Assessment and Plan: 1. ESBL UTI. Community-acquired pneumonia. Acute encephalopathy. Blood cultures are negative, 1 out of 2 positive for prognosis negative staph which is likely contamination. Urine is growing Klebsiella which is ESBL. Patient was on IV Zosyn, I changed to IV meropenem. Per my discussion with ID, we will finish 5 days of IV treatment and give her fosfomycin tomorrow  2. Acute on chronic kidney disease stage IV. Due to dehydration and infection. BUN more than 100, serum creatinine significantly elevated from baseline. Avoid nephrotoxic medication. Stop IV fluids. Renal function close to baseline.  3. Hypernatremia. Currently resolved in the setting of receiving IV normal saline. Osmolality also elevated. I changed the patient's fluids to D5. Currently stopped. Sodium better.  4. Alzheimer's dementia. Continue home regimen.  5. Hypercalcemia. Calcium level getting better, continue IV hydration at present.  6.. Stage II pressure injury of the right foot and heel. Continue supportive measures.  7.  Essential hypertension. Continue home regimen at present.  8. Hypokalemia. Hypophosphatemia Replacing. Rechecking tomorrow.  9. Poor by mouth intake. Patient has not been taking any of her medications as well as any significant meals by mouth. Patient primarily presented  with acute kidney injury likely from dehydration. At present her sodium and potassium appears to be also getting better. No nausea or vomiting has been reported. X-ray abdomen shows moderate stool in the rectum I would start her on bowel regimen. I would also start her on Megace for appetite stimulation. At present my concern is this is progression of patient's dementia and patient would continue to do poorly from here onwards. Palliative care consulted for further help.  Diet: cardiac diet DVT Prophylaxis: subcutaneous Heparin  Advance goals of care discussion: full code, I had extensive discussion with patient's daughter who was at bedside. Patient with poor appetite and presentation with acute kidney injury secondary to dehydration. At present I'm concerned that this will continue to get worse. Family had questions regarding use of alternative means to provide nutrition to the patient as well as medication. At present while NG tube or a PEG tube can temporarily provide medication as well as nutrition, in a patient with advanced dementia and history of aspiration, in the long run this would not be an optimal choice. Palliative care consulted for further input.  Family Communication: no family was present at bedside, at the time of interview.  Disposition:  Discharge to SNF.  Consultants:  palliative care Procedures: none  Antibiotics: Anti-infectives    Start     Dose/Rate Route Frequency Ordered Stop   05/16/17 1000  meropenem (MERREM) 1 g in sodium chloride 0.9 % 100 mL IVPB     1 g 200 mL/hr over 30 Minutes Intravenous Every 12 hours 05/16/17 0744 05/20/17 2359   05/14/17 2200  piperacillin-tazobactam (ZOSYN) IVPB 3.375 g  Status:  Discontinued     3.375 g 12.5 mL/hr over 240 Minutes Intravenous Every 8 hours  05/14/17 2020 05/16/17 0736   05/14/17 1715  cefTRIAXone (ROCEPHIN) 1 g in dextrose 5 % 50 mL IVPB     1 g 100 mL/hr over 30 Minutes Intravenous  Once 05/14/17 1700  05/14/17 1823       Objective: Physical Exam: Vitals:   05/19/17 2153 05/20/17 0500 05/20/17 0639 05/20/17 1512  BP: 106/72  100/76 94/66  Pulse: (!) 115  (!) 115 (!) 114  Resp: 18  18 20   Temp: 97.5 F (36.4 C)  97.5 F (36.4 C)   TempSrc: Axillary  Oral   SpO2: 100%  100% 100%  Weight:  79 kg (174 lb 3.2 oz)    Height:        Intake/Output Summary (Last 24 hours) at 05/20/17 1537 Last data filed at 05/20/17 0747  Gross per 24 hour  Intake          2302.08 ml  Output              400 ml  Net          1902.08 ml   Filed Weights   05/17/17 0457 05/19/17 0434 05/20/17 0500  Weight: 76.3 kg (168 lb 3.2 oz) 76.9 kg (169 lb 8 oz) 79 kg (174 lb 3.2 oz)   General: lethragic, Awake and Oriented to Place and Person. Appear in mild distress, affect withdrawn Eyes: PERRL, Conjunctiva normal ENT: Oral Mucosa clear dry. Neck: difficult to assess JVD, no Abnormal Mass Or lumps Cardiovascular: S1 and S2 Present, no Murmur, Peripheral Pulses Present Respiratory: Bilateral Air entry equal and Decreased, no use of accessory muscle, Clear to Auscultation, no Crackles, no wheezes Abdomen: Bowel Sound present, Soft and no tenderness Skin: redness no, no Rash, no induration Extremities: no Pedal edema, no calf tenderness Neurologic: Grossly no focal neuro deficit. Bilaterally Equal motor strength  Data Reviewed: CBC:  Recent Labs Lab 05/14/17 1530 05/15/17 0624 05/18/17 0501 05/19/17 0558 05/20/17 0800  WBC 9.9 9.7 9.8 8.9 8.9  NEUTROABS 7.4  --   --   --   --   HGB 10.9* 11.0* 10.1* 9.6* 10.8*  HCT 36.1 37.7 34.4* 33.6* 36.0  MCV 82.8 83.8 85.6 85.7 84.1  PLT 344 360 300 259 903   Basic Metabolic Panel:  Recent Labs Lab 05/14/17 2115  05/18/17 0501 05/18/17 0802  05/18/17 2342 05/19/17 0551 05/19/17 0558 05/19/17 1126 05/20/17 0800  NA  --   < > 152* 153*  < > 150* 149* 149* 145 140  K  --   < > 3.2* 3.1*  < > 2.7* 2.9* 2.9* 3.1* 3.2*  CL  --   < > 120* 122*  < >  119* 117* 118* 116* 111  CO2  --   < > 23 22  < > 22 21* 22 23 20*  GLUCOSE  --   < > 89 95  < > 129* 125* 126* 123* 115*  BUN  --   < > 46* 43*  < > 36* 33* 33* 31* 22*  CREATININE  --   < > 1.08* 1.10*  < > 0.98 0.96 1.00 0.94 0.88  CALCIUM  --   < > 10.6* 10.5*  < > 10.2 10.1 10.2 9.9 10.0  MG 3.0*  --   --  1.9  --   --   --  1.6*  --   --   PHOS 4.8*  --  1.4*  --   --   --  2.1*  --   --  1.5*  < > = values in this interval not displayed.  Liver Function Tests:  Recent Labs Lab 05/14/17 1530 05/15/17 0624 05/18/17 0501 05/19/17 0551 05/20/17 0800  AST 21 23  --   --   --   ALT 14 15  --   --   --   ALKPHOS 73 75  --   --   --   BILITOT 0.7 0.4  --   --   --   PROT 6.6 6.9  --   --   --   ALBUMIN 2.4* 2.3* 2.0* 2.0* 2.1*   No results for input(s): LIPASE, AMYLASE in the last 168 hours. No results for input(s): AMMONIA in the last 168 hours. Coagulation Profile: No results for input(s): INR, PROTIME in the last 168 hours. Cardiac Enzymes: No results for input(s): CKTOTAL, CKMB, CKMBINDEX, TROPONINI in the last 168 hours. BNP (last 3 results) No results for input(s): PROBNP in the last 8760 hours. CBG: No results for input(s): GLUCAP in the last 168 hours. Studies: No results found.  Scheduled Meds: . aspirin EC  81 mg Oral Daily  . clotrimazole   Topical BID  . donepezil  10 mg Oral QHS  . feeding supplement (PRO-STAT SUGAR FREE 64)  30 mL Oral TID BM  . fluticasone  2 spray Each Nare Daily  . [START ON 05/21/2017] fosfomycin  3 g Oral Once  . [START ON 05/21/2017] fosfomycin  3 g Oral Once  . heparin  5,000 Units Subcutaneous Q8H  . loratadine  10 mg Oral Daily  . mouth rinse  15 mL Mouth Rinse BID  . megestrol  400 mg Oral Daily  . memantine  28 mg Oral BH-q7a  . mirtazapine  15 mg Oral QHS  . multivitamin with minerals  1 tablet Oral Daily  . pantoprazole  40 mg Oral Daily  . polyethylene glycol  17 g Oral Daily  . potassium phosphate (monobasic)  500 mg  Oral TID WC & HS  . senna-docusate  2 tablet Oral QHS   Continuous Infusions: . meropenem (MERREM) IV Stopped (05/20/17 1030)   PRN Meds: acetaminophen, HYDROcodone-acetaminophen, ondansetron **OR** ondansetron (ZOFRAN) IV  Time spent: 35 minutes  Author: Berle Mull, MD [facility] Pager: [phone] 05/20/2017 3:37 PM  If 7PM-7AM, please contact night-coverage at [email or web address], password [facility]

## 2017-05-21 LAB — RENAL FUNCTION PANEL
ALBUMIN: 1.9 g/dL — AB (ref 3.5–5.0)
Anion gap: 9 (ref 5–15)
BUN: 20 mg/dL (ref 6–20)
CALCIUM: 9.9 mg/dL (ref 8.9–10.3)
CO2: 22 mmol/L (ref 22–32)
CREATININE: 0.92 mg/dL (ref 0.44–1.00)
Chloride: 110 mmol/L (ref 101–111)
GFR calc Af Amer: >60 mL/min (ref >60)
GFR calc non Af Amer: 59 mL/min — ABNORMAL LOW (ref >60)
GLUCOSE: 90 mg/dL (ref 65–99)
PHOSPHORUS: 2.4 mg/dL — AB (ref 2.5–4.6)
Potassium: 3.4 mmol/L — ABNORMAL LOW (ref 3.5–5.1)
SODIUM: 141 mmol/L (ref 135–145)

## 2017-05-21 LAB — CBC
HEMATOCRIT: 32.7 % — AB (ref 36.0–46.0)
Hemoglobin: 9.8 g/dL — ABNORMAL LOW (ref 12.0–15.0)
MCH: 24.9 pg — ABNORMAL LOW (ref 26.0–34.0)
MCHC: 30 g/dL (ref 30.0–36.0)
MCV: 83.2 fL (ref 78.0–100.0)
PLATELETS: 220 10*3/uL (ref 150–400)
RBC: 3.93 MIL/uL (ref 3.87–5.11)
RDW: 18.3 % — AB (ref 11.5–15.5)
WBC: 9.6 10*3/uL (ref 4.0–10.5)

## 2017-05-21 NOTE — Progress Notes (Signed)
[facility] Progress Note  Patient: Katherine Cooper [id number]:[id number]   PCP: Nolene Ebbs, MD DOB: 02/15/1941   DOA: 05/14/2017   DOS: 05/21/2017   Date of Service: the patient was seen and examined on 05/21/2017  Subjective: Patient not taking anything by mouth. Not eating anything for last 3 days per nurse tach with seeing the patient for 3 days. Patient denies any complains of nausea or vomiting abdominal pain. Also had a bowel movement.  Brief hospital course: Pt. with PMH of dementia, HTN, CVA, obesity, CKD stage III, GERD; admitted on 05/14/2017, presented with complaint of confusion, was found to have UTI and CAP. Currently further plan is discussed goals of care with family.  Assessment and Plan: 1. ESBL UTI. Community-acquired pneumonia. Acute encephalopathy. Blood cultures are negative, 1 out of 2 positive for prognosis negative staph which is likely contamination. Urine is growing Klebsiella which is ESBL. Patient was on IV Zosyn, I changed to IV meropenem. Per my discussion with ID, we finish 5 days of IV treatment and give her fosfomycin  2. Acute on chronic kidney disease stage IV. Due to dehydration and infection. BUN more than 100, serum creatinine significantly elevated from baseline. Avoid nephrotoxic medication. Renal function close to baseline.  3. Hypernatremia. Currently resolved in the setting of receiving IV normal saline. Osmolality also elevated. I changed the patient's fluids to D5. Currently stopped. Sodium better.  4. Alzheimer's dementia. Continue home regimen.  5. Hypercalcemia. Calcium level getting better, continue IV hydration at present.  6.. Stage II pressure injury of the right foot and heel. Continue supportive measures.  7.  Essential hypertension. Continue home regimen at present.  8. Hypokalemia. Hypophosphatemia Replacing. Rechecking tomorrow.  9. Poor by mouth intake. Patient has not been taking any of her  medications as well as any significant meals by mouth. Patient primarily presented with acute kidney injury likely from dehydration. At present her sodium and potassium appears to be also getting better. No nausea or vomiting has been reported. X-ray abdomen shows moderate stool in the rectum I would start her on bowel regimen. I would also start her on Megace for appetite stimulation. At present my concern is this is progression of patient's dementia and patient would continue to do poorly from here onwards. Palliative care consulted for further help. Patient would be appropriate for hospice on discharge.  Diet: cardiac diet DVT Prophylaxis: subcutaneous Heparin  Advance goals of care discussion: full code, I had extensive discussion with patient's daughter who was at bedside. Patient with poor appetite and presentation with acute kidney injury secondary to dehydration. At present I'm concerned that this will continue to get worse. Family had questions regarding use of alternative means to provide nutrition to the patient as well as medication. At present while NG tube or a PEG tube can temporarily provide medication as well as nutrition, in a patient with advanced dementia and history of aspiration, in the long run this would not be an optimal choice. Palliative care consulted for further input.  Family Communication: no family was present at bedside, at the time of interview.  Disposition:  Discharge to SNF.  Consultants:  palliative care Procedures: none  Antibiotics: Anti-infectives    Start     Dose/Rate Route Frequency Ordered Stop   05/16/17 1000  meropenem (MERREM) 1 g in sodium chloride 0.9 % 100 mL IVPB     1 g 200 mL/hr over 30 Minutes Intravenous Every 12 hours 05/16/17 0744 05/20/17 2249   05/14/17 2200  piperacillin-tazobactam (ZOSYN) IVPB 3.375 g  Status:  Discontinued     3.375 g 12.5 mL/hr over 240 Minutes Intravenous Every 8 hours 05/14/17 2020 05/16/17 0736    05/14/17 1715  cefTRIAXone (ROCEPHIN) 1 g in dextrose 5 % 50 mL IVPB     1 g 100 mL/hr over 30 Minutes Intravenous  Once 05/14/17 1700 05/14/17 1823       Objective: Physical Exam: Vitals:   05/20/17 2223 05/21/17 0545 05/21/17 0643 05/21/17 1608  BP: 108/81 98/65  121/85  Pulse: (!) 117 (!) 106  (!) 115  Resp: 18 18  20   Temp: 99.2 F (37.3 C) 97.7 F (36.5 C)  98.8 F (37.1 C)  TempSrc: Oral Oral    SpO2: 100% 100%  100%  Weight:   78.7 kg (173 lb 6.4 oz)   Height:        Intake/Output Summary (Last 24 hours) at 05/21/17 1805 Last data filed at 05/21/17 1724  Gross per 24 hour  Intake              190 ml  Output              200 ml  Net              -10 ml   Filed Weights   05/19/17 0434 05/20/17 0500 05/21/17 0643  Weight: 76.9 kg (169 lb 8 oz) 79 kg (174 lb 3.2 oz) 78.7 kg (173 lb 6.4 oz)   General: lethragic, Awake and Oriented to Place and Person. Appear in mild distress, affect withdrawn Eyes: PERRL, Conjunctiva normal ENT: Oral Mucosa clear dry. Neck: difficult to assess JVD, no Abnormal Mass Or lumps Cardiovascular: S1 and S2 Present, no Murmur, Peripheral Pulses Present Respiratory: Bilateral Air entry equal and Decreased, no use of accessory muscle, Clear to Auscultation, no Crackles, no wheezes Abdomen: Bowel Sound present, Soft and no tenderness Skin: redness no, no Rash, no induration Extremities: no Pedal edema, no calf tenderness Neurologic: Grossly no focal neuro deficit. Bilaterally Equal motor strength  Data Reviewed: CBC:  Recent Labs Lab 05/15/17 0624 05/18/17 0501 05/19/17 0558 05/20/17 0800 05/21/17 0520  WBC 9.7 9.8 8.9 8.9 9.6  HGB 11.0* 10.1* 9.6* 10.8* 9.8*  HCT 37.7 34.4* 33.6* 36.0 32.7*  MCV 83.8 85.6 85.7 84.1 83.2  PLT 360 300 259 238 678   Basic Metabolic Panel:  Recent Labs Lab 05/14/17 2115  05/18/17 0501 05/18/17 0802  05/19/17 0551 05/19/17 0558 05/19/17 1126 05/20/17 0800 05/21/17 0520  NA  --   < > 152*  153*  < > 149* 149* 145 140 141  K  --   < > 3.2* 3.1*  < > 2.9* 2.9* 3.1* 3.2* 3.4*  CL  --   < > 120* 122*  < > 117* 118* 116* 111 110  CO2  --   < > 23 22  < > 21* 22 23 20* 22  GLUCOSE  --   < > 89 95  < > 125* 126* 123* 115* 90  BUN  --   < > 46* 43*  < > 33* 33* 31* 22* 20  CREATININE  --   < > 1.08* 1.10*  < > 0.96 1.00 0.94 0.88 0.92  CALCIUM  --   < > 10.6* 10.5*  < > 10.1 10.2 9.9 10.0 9.9  MG 3.0*  --   --  1.9  --   --  1.6*  --   --   --  PHOS 4.8*  --  1.4*  --   --  2.1*  --   --  1.5* 2.4*  < > = values in this interval not displayed.  Liver Function Tests:  Recent Labs Lab 05/15/17 0624 05/18/17 0501 05/19/17 0551 05/20/17 0800 05/21/17 0520  AST 23  --   --   --   --   ALT 15  --   --   --   --   ALKPHOS 75  --   --   --   --   BILITOT 0.4  --   --   --   --   PROT 6.9  --   --   --   --   ALBUMIN 2.3* 2.0* 2.0* 2.1* 1.9*   No results for input(s): LIPASE, AMYLASE in the last 168 hours. No results for input(s): AMMONIA in the last 168 hours. Coagulation Profile: No results for input(s): INR, PROTIME in the last 168 hours. Cardiac Enzymes: No results for input(s): CKTOTAL, CKMB, CKMBINDEX, TROPONINI in the last 168 hours. BNP (last 3 results) No results for input(s): PROBNP in the last 8760 hours. CBG: No results for input(s): GLUCAP in the last 168 hours. Studies: No results found.  Scheduled Meds: . aspirin EC  81 mg Oral Daily  . clotrimazole   Topical BID  . donepezil  10 mg Oral QHS  . feeding supplement (PRO-STAT SUGAR FREE 64)  30 mL Oral TID BM  . fluticasone  2 spray Each Nare Daily  . heparin  5,000 Units Subcutaneous Q8H  . loratadine  10 mg Oral Daily  . mouth rinse  15 mL Mouth Rinse BID  . megestrol  400 mg Oral Daily  . memantine  28 mg Oral BH-q7a  . mirtazapine  15 mg Oral QHS  . multivitamin with minerals  1 tablet Oral Daily  . pantoprazole  40 mg Oral Daily  . phosphorus  500 mg Oral TID WC & HS  . polyethylene glycol  17 g  Oral Daily  . senna-docusate  2 tablet Oral QHS   Continuous Infusions:  PRN Meds: acetaminophen, HYDROcodone-acetaminophen, ondansetron **OR** ondansetron (ZOFRAN) IV  Time spent: 35 minutes  Author: Berle Mull, MD [facility] Pager: [phone] 05/21/2017 6:05 PM  If 7PM-7AM, please contact night-coverage at [email or web address], password [facility]

## 2017-05-22 ENCOUNTER — Inpatient Hospital Stay (HOSPITAL_COMMUNITY): Payer: Medicare Other

## 2017-05-22 DIAGNOSIS — Z7189 Other specified counseling: Secondary | ICD-10-CM

## 2017-05-22 LAB — CBC
HCT: 33.5 % — ABNORMAL LOW (ref 36.0–46.0)
Hemoglobin: 9.9 g/dL — ABNORMAL LOW (ref 12.0–15.0)
MCH: 24.7 pg — ABNORMAL LOW (ref 26.0–34.0)
MCHC: 29.6 g/dL — ABNORMAL LOW (ref 30.0–36.0)
MCV: 83.5 fL (ref 78.0–100.0)
PLATELETS: 241 10*3/uL (ref 150–400)
RBC: 4.01 MIL/uL (ref 3.87–5.11)
RDW: 18.3 % — ABNORMAL HIGH (ref 11.5–15.5)
WBC: 10 10*3/uL (ref 4.0–10.5)

## 2017-05-22 LAB — RENAL FUNCTION PANEL
Albumin: 1.9 g/dL — ABNORMAL LOW (ref 3.5–5.0)
Anion gap: 11 (ref 5–15)
BUN: 18 mg/dL (ref 6–20)
CALCIUM: 9.8 mg/dL (ref 8.9–10.3)
CO2: 23 mmol/L (ref 22–32)
CREATININE: 0.97 mg/dL (ref 0.44–1.00)
Chloride: 111 mmol/L (ref 101–111)
GFR calc Af Amer: >60 mL/min (ref >60)
GFR, EST NON AFRICAN AMERICAN: 55 mL/min — AB (ref >60)
Glucose, Bld: 85 mg/dL (ref 65–99)
Phosphorus: 3 mg/dL (ref 2.5–4.6)
Potassium: 3.1 mmol/L — ABNORMAL LOW (ref 3.5–5.1)
SODIUM: 145 mmol/L (ref 135–145)

## 2017-05-22 MED ORDER — ENSURE ENLIVE PO LIQD
237.0000 mL | Freq: Two times a day (BID) | ORAL | Status: DC
  Administered 2017-05-22: 237 mL via ORAL

## 2017-05-22 MED ORDER — ENSURE ENLIVE PO LIQD
237.0000 mL | Freq: Three times a day (TID) | ORAL | Status: DC
  Administered 2017-05-24: 237 mL via ORAL

## 2017-05-22 NOTE — Progress Notes (Signed)
Nutrition Brief Note  Chart reviewed. Pt now transitioning to comfort care.  No further nutrition interventions warranted at this time.  Please re-consult as needed.   Ranny Wiebelhaus A. Twan Harkin, RD, LDN, CDE Pager: [phone] After hours Pager: [phone]  

## 2017-05-22 NOTE — Progress Notes (Signed)
Patient has Sat O2=100% on room air. Will continue to monitor.

## 2017-05-22 NOTE — Progress Notes (Signed)
Daily Progress Note   Patient Name: Katherine Cooper       Date: 05/22/2017 DOB: 1941/08/23  Age: 76 y.o. MRN#: [id number] Attending Physician: Katherine Hamman, MD Primary Care Physician: Katherine Ebbs, MD Admit Date: 05/14/2017  Reason for Consultation/Follow-up: Disposition, Establishing goals of care and Psychosocial/spiritual support  Subjective: Katherine Cooper is asleep in bed. She will open her eyes to voice, but does not engage beyond a few words then falls back asleep. She was able to take her oral medication this morning, but continues to eat <10% of her meals.  Length of Stay: 8  Current Medications: Scheduled Meds:  . aspirin EC  81 mg Oral Daily  . clotrimazole   Topical BID  . donepezil  10 mg Oral QHS  . feeding supplement (ENSURE ENLIVE)  237 mL Oral TID BM  . fluticasone  2 spray Each Nare Daily  . loratadine  10 mg Oral Daily  . mouth rinse  15 mL Mouth Rinse BID  . megestrol  400 mg Oral Daily  . memantine  28 mg Oral BH-q7a  . mirtazapine  15 mg Oral QHS  . multivitamin with minerals  1 tablet Oral Daily  . pantoprazole  40 mg Oral Daily  . phosphorus  500 mg Oral TID WC & HS  . polyethylene glycol  17 g Oral Daily  . senna-docusate  2 tablet Oral QHS   Continuous Infusions:  PRN Meds: acetaminophen, HYDROcodone-acetaminophen, ondansetron **OR** ondansetron (ZOFRAN) IV  Physical Exam  Constitutional: She appears well-developed and well-nourished. She appears lethargic. She has a sickly appearance. No distress.  HENT:  Head: Normocephalic and atraumatic.  Mouth/Throat: Mucous membranes are dry.  Eyes: EOM are normal.  Neck: Normal range of motion. Neck supple.  Cardiovascular: Regular rhythm.  Tachycardia present.   Pulmonary/Chest: Effort normal. No respiratory distress.  Abdominal: Soft. Normal appearance  and bowel sounds are normal.  Musculoskeletal:  Not following simple commands, UTA  Neurological: She appears lethargic.  Simple social responses, but otherwise not interactive. Very lethargic.  Skin: Skin is warm and dry.  Extremities cool, but pulses intact and no skin mottling noted.  Psychiatric:  Minimally interactive. Appears comfortable in bed.             Vital Signs: BP (!) 91/56 (BP Location: Right Arm)   Pulse (!) 107   Temp 98.3 F (36.8 C) (Oral)   Resp 18   Ht 5' 4" (1.626 m)   Wt 74.6 kg (164 lb 6.4 oz)   SpO2 100%   BMI 28.22 kg/m   SpO2: SpO2: 100 % O2 Device: O2 Device: Not Delivered O2 Flow Rate: O2 Flow Rate (L/min): 3 L/min  Intake/output summary:  Intake/Output Summary (Last 24 hours) at 05/22/17 1404 Last data filed at 05/22/17 1039  Gross per 24 hour  Intake              750 ml  Output                0 ml  Net              750 ml   LBM: Last BM Date: 05/22/17 Baseline Weight: Weight: 90.7 kg (  200 lb) Most recent weight: Weight: 74.6 kg (164 lb 6.4 oz)  Palliative Assessment/Data: PPS 10%   Flowsheet Rows     Most Recent Value  Intake Tab  Referral Department  Hospitalist  Unit at Time of Referral  Med/Surg Unit  Palliative Care Primary Diagnosis  Sepsis/Infectious Disease  Date Notified  05/19/17  Palliative Care Type  New Palliative care  Reason for referral  Clarify Goals of Care  Date of Admission  05/14/17  Date first seen by Palliative Care  05/20/17  # of days Palliative referral response time  1 Day(s)  # of days IP prior to Palliative referral  5  Clinical Assessment  Palliative Performance Scale Score  30%  Pain Max last 24 hours  Not able to report  Pain Min Last 24 hours  Not able to report  Dyspnea Max Last 24 Hours  Not able to report  Dyspnea Min Last 24 hours  Not able to report  Nausea Max Last 24 Hours  Not able to report  Nausea Min Last 24 Hours  Not able to report  Anxiety Max Last 24 Hours  Not able to report    Anxiety Min Last 24 Hours  Not able to report  Other Max Last 24 Hours  Not able to report  Psychosocial & Spiritual Assessment  Palliative Care Outcomes  Patient/Family meeting held?  No  Palliative Care Outcomes  Improved pain interventions  Palliative Care follow-up planned  Yes, Facility      Patient Active Problem List   Diagnosis Date Noted  . Palliative care by specialist   . Pressure injury of skin 05/15/2017  . Acute encephalopathy 05/14/2017  . Face, Legs, Activity, Cry, Consolability (FLACC) pain assessment scale cry score 2, crying steadily, screams or sobs, frequent complaints 05/14/2017  . Hypercalcemia 05/14/2017  . Alzheimer's dementia, late onset, with behavioral disturbance 02/06/2017  . Leukocytosis 02/06/2017  . Leg swelling 02/06/2017  . Normocytic anemia 02/06/2017  . Acute renal failure (ARF) ([place]) 02/05/2017  . Chest pain 10/12/2013  . Palpitations 10/12/2013  . Acute bronchitis 10/12/2013  . ARF (acute renal failure) ([place]) 10/12/2013  . SOB (shortness of breath) 05/26/2013  . Breast cancer ([place]) 10/22/2012  . Dehydration 07/22/2012  . Chronic pain 07/22/2012  . Anemia 07/22/2012  . Hypotension 07/21/2012  . Hip pain 07/21/2012  . Anxiety   . Elbow pain 07/09/2012  . PNA (pneumonia) 07/08/2012  . Hyperkalemia 07/08/2012  . Essential hypertension 07/08/2012  . CKD (chronic kidney disease) stage 4, GFR 15-29 ml/min (HCC) 07/08/2012  . Chest pain at rest 11/12/2011    Class: Acute  . Bilateral leg edema 11/12/2011    Class: Chronic  . Obesity (BMI 30-39.9) 11/12/2011    Class: Chronic  . Gout 11/12/2011    Class: Chronic    Palliative Care Assessment & Plan   HPI: 76 y.o. female  with past medical history of Dementia, hypertension, CVA, chronic kidney disease stage III, and GERD. She presented to the ED from SNF with SOB and confusion, and was admitted on 05/14/2017 with  ESBL UTI, Community acquired pneumonia, and electrolyte abnormalities.  Abx course completed and electrolytes improved with intervention. Unfortunately, her intake became progressively poor and now she is not eating or drinking more than a bite/sip or two. At baseline she is bed-bound but normally verbally interactive. Palliative consulted to assist in clarifying goals of care given her dementia and significant decline.   Assessment: Katherine Cooper is unable to meaningfully participate  in a goals of care conversation secondary to dementia. I met with her two daughters, son, and grandson outside of the room, per their request. In our meeting they detailed that Mrs. Demuro was once a very vivacious and independent woman. She enjoyed dancing and time with her family. Since her dementia diagnosis about 3-4 years ago, they have noted her slow decline both mentally and physically. In the last few months this decline has been more steady, progressing to the point of her being bed-bound, and with a decreasing appetite. Additionally, she has been expressing feeling "tired and worn out," which her family feels signifies her diminishing spirit and will to live.   In listening to their stories, I shared my concern that her lack of eating/drinking is likely related to the progression of her underlying dementia. I suspect the infections were likely a tipping point wherein her body could not longer cope and she suffered a more rapid deterioration. In considering care trajectory, we talked through how to best support her. We talked about a feeding tube and rehab, which is following a more aggressive approach; we also talked about focusing on comfort and quality of life. After discussing these options, they felt proceeding with comfort measures and pursuing residential hospice was the best option.   I do believe she would be appropriate for residential placement. In the context of dementia, she is not eating or drinking (Albumin 1.9) and now sleeping the majority of the day. Her family is familiar with  [facility], and requests she transfer there once a bed is available.   Finally, we did talk about code status. I recommended allowing her to die a natural death, which her family agreed with. I explained that would mean no CPR, defibrillation, or intubation, and instead efforts would be focused on ensuring comfort and dignity as she passed.   Recommendations/Plan:  DNR, full comfort care  SW consulted for referral for residential hospice, [facility] is first choice  Goals of Care and Additional Recommendations:  Limitations on Scope of Treatment: Full Comfort Care  Code Status:  DNR  Prognosis:   < 2 weeks  Discharge Planning:  Hospice facility  Care plan was discussed with pt's family.  Thank you for allowing the Palliative Medicine Team to assist in the care of this patient.  Time in: 1320 Time out: 1420  Total time: 110 minutes     Greater than 50%  of this time was spent counseling and coordinating care related to the above assessment and plan.  Charlynn Court, NP Palliative Medicine Team [phone] pager (7a-5p) Team Phone # [phone]

## 2017-05-22 NOTE — Progress Notes (Signed)
[facility] Progress Note  Patient: Katherine Cooper NUU:[id number]   PCP: Nolene Ebbs, MD DOB: 1941/03/26   DOA: 05/14/2017   DOS: 05/22/2017   Date of Service: the patient was seen and examined on 05/22/2017  Subjective: The patient is more awake today. Still mentions about complaining her ankle pain. No nausea no vomiting. Continues to have no by mouth intake but was able to drink full glass of water for me. No nausea no vomiting.  Brief hospital course: Pt. with PMH of dementia, HTN, CVA, obesity, CKD stage III, GERD; admitted on 05/14/2017, presented with complaint of confusion, was found to have UTI and CAP. Treated with IV meropenem and fosfomycin. Continues to have poor by mouth intake and after discussion with family the patient is transitioned to full comfort and awaiting inpatient hospice facility Currently further plan is for wide comfort care in the hospital while awaiting blood  Assessment and Plan: 1. ESBL UTI. Community-acquired pneumonia. Acute encephalopathy. Blood cultures are negative, 1 out of 2 positive for coagulase negative staph which is likely contamination. Urine is growing Klebsiella which is ESBL. Patient was on IV Zosyn, I changed to IV meropenem. Per my discussion with ID, we finish 5 days of IV treatment and give her fosfomycin  2. Acute on chronic kidney disease stage IV. Due to dehydration and infection. BUN more than 100, serum creatinine significantly elevated from baseline. Avoid nephrotoxic medication. Renal function close to baseline.  3. Hypernatremia. Currently resolved in the setting of receiving IV normal saline. Osmolality also elevated. I changed the patient's fluids to D5. Currently stopped. Sodium better.  4. Alzheimer's dementia. Continue home regimen.  5. Hypercalcemia. Calcium level getting better,  6.. Stage II pressure injury of the right foot and heel. Continue supportive measures.  7.  Essential  hypertension. Continue home regimen at present.  8. Hypokalemia. Hypophosphatemia Replaced  9.Poor by mouth intake. Patient has not been taking any of her medications as well as any significant meals by mouth. Patient primarily presented with acute kidney injury likely from dehydration. At present her sodium and potassium appears to be also getting better. No nausea or vomiting has been reported. X-ray abdomen shows moderate stool in the rectum I would start her on bowel regimen. I would also start her on Megacefor appetite stimulation. At present my concern is this is progression of patient's dementia and patient would continue to do poorly from here onwards. Palliative care consulted for further help.  Currently transitioned to comfort care would likely go to inpatient hospice due to poor by mouth intake limiting prognosis to less than 4 weeks.  Diet: cardiac diet DVT Prophylaxis:subcutaneous Heparin  Advance goals of care discussion:DNR/DNI, comfort care  Family Communication:no family was present at bedside, at the time of interview.  Disposition: Discharge to inpatient hospice.  Consultants: Palliative care, ID chart review Procedures: None  Antibiotics: Anti-infectives    Start     Dose/Rate Route Frequency Ordered Stop   05/16/17 1000  meropenem (MERREM) 1 g in sodium chloride 0.9 % 100 mL IVPB     1 g 200 mL/hr over 30 Minutes Intravenous Every 12 hours 05/16/17 0744 05/20/17 2249   05/14/17 2200  piperacillin-tazobactam (ZOSYN) IVPB 3.375 g  Status:  Discontinued     3.375 g 12.5 mL/hr over 240 Minutes Intravenous Every 8 hours 05/14/17 2020 05/16/17 0736   05/14/17 1715  cefTRIAXone (ROCEPHIN) 1 g in dextrose 5 % 50 mL IVPB     1 g 100 mL/hr over  30 Minutes Intravenous  Once 05/14/17 1700 05/14/17 1823       Objective: Physical Exam: Vitals:   05/22/17 0734 05/22/17 1039 05/22/17 1055 05/22/17 1327  BP: 104/71   (!) 91/56  Pulse: (!) 109   (!) 107   Resp:    18  Temp: 97.8 F (36.6 C)   98.3 F (36.8 C)  TempSrc:    Oral  SpO2: 100% 100% 98% 100%  Weight:      Height:        Intake/Output Summary (Last 24 hours) at 05/22/17 1753 Last data filed at 05/22/17 1651  Gross per 24 hour  Intake              720 ml  Output                0 ml  Net              720 ml   Filed Weights   05/20/17 0500 05/21/17 0643 05/22/17 0500  Weight: 79 kg (174 lb 3.2 oz) 78.7 kg (173 lb 6.4 oz) 74.6 kg (164 lb 6.4 oz)   General: Alert, Awake and Oriented to Time, Place and Person. Appear in mild distress, affect blunted Eyes: PERRL, Conjunctiva normal ENT: Oral Mucosa clear moist. Neck: difficult to assess JVD, no Abnormal Mass Or lumps Cardiovascular: S1 and S2 Present, aortic systolic Murmur, Peripheral Pulses Present Respiratory: normal respiratory effort, Bilateral Air entry equal and Decreased, no use of accessory muscle, basal Crackles, no wheezes Abdomen: Bowel Sound present, Soft and no tenderness,  Skin: no redness, no Rash, no induration Extremities: no Pedal edema, no calf tenderness  Data Reviewed: CBC:  Recent Labs Lab 05/18/17 0501 05/19/17 0558 05/20/17 0800 05/21/17 0520 05/22/17 0602  WBC 9.8 8.9 8.9 9.6 10.0  HGB 10.1* 9.6* 10.8* 9.8* 9.9*  HCT 34.4* 33.6* 36.0 32.7* 33.5*  MCV 85.6 85.7 84.1 83.2 83.5  PLT 300 259 238 220 629   Basic Metabolic Panel:  Recent Labs Lab 05/18/17 0501 05/18/17 0802  05/19/17 0551 05/19/17 0558 05/19/17 1126 05/20/17 0800 05/21/17 0520 05/22/17 0602  NA 152* 153*  < > 149* 149* 145 140 141 145  K 3.2* 3.1*  < > 2.9* 2.9* 3.1* 3.2* 3.4* 3.1*  CL 120* 122*  < > 117* 118* 116* 111 110 111  CO2 23 22  < > 21* 22 23 20* 22 23  GLUCOSE 89 95  < > 125* 126* 123* 115* 90 85  BUN 46* 43*  < > 33* 33* 31* 22* 20 18  CREATININE 1.08* 1.10*  < > 0.96 1.00 0.94 0.88 0.92 0.97  CALCIUM 10.6* 10.5*  < > 10.1 10.2 9.9 10.0 9.9 9.8  MG  --  1.9  --   --  1.6*  --   --   --   --   PHOS  1.4*  --   --  2.1*  --   --  1.5* 2.4* 3.0  < > = values in this interval not displayed.  Liver Function Tests:  Recent Labs Lab 05/18/17 0501 05/19/17 0551 05/20/17 0800 05/21/17 0520 05/22/17 0602  ALBUMIN 2.0* 2.0* 2.1* 1.9* 1.9*   No results for input(s): LIPASE, AMYLASE in the last 168 hours. No results for input(s): AMMONIA in the last 168 hours. Coagulation Profile: No results for input(s): INR, PROTIME in the last 168 hours. Cardiac Enzymes: No results for input(s): CKTOTAL, CKMB, CKMBINDEX, TROPONINI in the last 168 hours. BNP (last  3 results) No results for input(s): PROBNP in the last 8760 hours. CBG: No results for input(s): GLUCAP in the last 168 hours. Studies: No results found.  Scheduled Meds: . aspirin EC  81 mg Oral Daily  . clotrimazole   Topical BID  . donepezil  10 mg Oral QHS  . feeding supplement (ENSURE ENLIVE)  237 mL Oral TID BM  . fluticasone  2 spray Each Nare Daily  . loratadine  10 mg Oral Daily  . mouth rinse  15 mL Mouth Rinse BID  . memantine  28 mg Oral BH-q7a  . mirtazapine  15 mg Oral QHS  . multivitamin with minerals  1 tablet Oral Daily  . pantoprazole  40 mg Oral Daily  . phosphorus  500 mg Oral TID WC & HS  . polyethylene glycol  17 g Oral Daily  . senna-docusate  2 tablet Oral QHS   Continuous Infusions: PRN Meds: acetaminophen, HYDROcodone-acetaminophen, ondansetron **OR** ondansetron (ZOFRAN) IV  Time spent: 30 minutes  Author: Berle Mull, MD [facility] Pager: [phone] 05/22/2017 5:53 PM  If 7PM-7AM, please contact night-coverage at [email or web address], password [facility]

## 2017-05-22 NOTE — Progress Notes (Signed)
Nutrition Follow-up  DOCUMENTATION CODES:   Morbid obesity  INTERVENTION:   -D/c 30 ml Prostat TID, due to poor acceptance -Increase Ensure Enlive po to TID, each supplement provides 350 kcal and 20 grams of protein -Continue MVI daily -If pt/family desire aggressive measures/nutrition support, recommend:  Initiate Jevity 1.2 @ 20 ml/hr via cortrak tube and increase by 10 ml every 12 hours to goal rate of 50 ml/hr.   60 ml Prostat daily.    Tube feeding regimen provides 1640 kcal (100% of needs), 97 grams of protein, and 968 ml of H2O.   NUTRITION DIAGNOSIS:   Increased nutrient needs related to wound healing as evidenced by estimated needs.  Ongoing  GOAL:   Patient will meet greater than or equal to 90% of their needs  Unmet  MONITOR:   PO intake, Supplement acceptance, Diet advancement, Labs, Weight trends, Skin, I & O's  REASON FOR ASSESSMENT:   Consult Assessment of nutrition requirement/status  ASSESSMENT:   Katherine Cooper is a 76 y/o woman with hx of dementia, HTN, CVA, obesity, DDD, CKD stage 3 and GERD; who presented to ED from SNF due to worsening mentation and SOB. Patient at baseline is essentially bed-ridden and oriented X 2 intermittently. For the last 3 days she has experienced increase confusion/disorientation and anorexia. 24 hrs prior to admission patient also have an episode of choking while being fed as per SNF nursing staff. On day of admission patient was essentially unresponsive, with non understandable mumbling, soft BP and hypoxic. EMS was called and patient transported to ED for further evaluation and treatment.  Pt in bed, mumbling unintelligibly and unable to participate in interview. No family at bedside.   Pt advanced to a soft diet on 05/16/17. Intake remains minimal; PO: 0-10%. Pt has been refusing Prostat supplements, but did accept Ensure.   Palliative care team following for goals of care. Per chart review, family meeting scheduled  for 1300.   Medications reviewed and include Megace and Remeron.   Labs reviewed: K: 3.1 (KPhos).   Diet Order:  DIET SOFT Room service appropriate? Yes; Fluid consistency: Thin  Skin:  Wound (see comment) (stage II buttocks)  Last BM:  05/22/17  Height:   Ht Readings from Last 1 Encounters:  05/15/17 5\' 4"  (1.626 m)    Weight:   Wt Readings from Last 1 Encounters:  05/22/17 164 lb 6.4 oz (74.6 kg)    Ideal Body Weight:  54.5 kg  BMI:  Body mass index is 28.22 kg/m.  Estimated Nutritional Needs:   Kcal:  1600-1800  Protein:  95-110 grams  Fluid:  >1.6 L  EDUCATION NEEDS:   Education needs addressed  Rhanda Lemire A. Jimmye Norman, RD, LDN, CDE Pager: [phone] After hours Pager: [phone]

## 2017-05-22 NOTE — Progress Notes (Addendum)
Palliative Care  Presently our team is working to schedule a meeting with Katherine Cooper family for a goals of care discussion. I have left my contact information on voicemail messages for two daughters (listed in the chart). I was able to speak with the patient's son, Katherine Cooper. He is unsure of when his family can meet, but plans to speak with his sisters and call me back with possible times.   ADDENDUM: Family is able to meet at 1300 today.   Charlynn Court [facility] [phone]

## 2017-05-22 NOTE — Progress Notes (Signed)
CSW sent referral to [facility] with [facility] for assessment.  Percell Locus Kaniesha Barile LCSWA [phone]

## 2017-05-23 DIAGNOSIS — Z515 Encounter for palliative care: Secondary | ICD-10-CM

## 2017-05-23 NOTE — Clinical Social Work Note (Signed)
[facility] called to let CSW know that pt isnot a canadite for their hospice home. Pt's daughter aware. CSW spoke with pt's daughter at bedside. CSW explained that hospice would be a private pay if pt get its at the facility. Pt is from [place]. Pt's daughter does not wish to purse hospice at the facility at this time. Pt's daughter also wishes to get her mom out of [place]. At this time pt's daughter wants her mom to go to [facility]. Althea Charon made aware.   Rebecca, [place]

## 2017-05-23 NOTE — Progress Notes (Signed)
[facility] does not have beds. CSW sent referral to [facility] for assessment. CSW left patient's daughters a voicemail to talk with them about the possible change in location.  Percell Locus Annice Jolly LCSWA [phone]

## 2017-05-23 NOTE — Progress Notes (Signed)
Daily Progress Note   Patient Name: Katherine Cooper       Date: 05/23/2017 DOB: 1941/07/16  Age: 76 y.o. MRN#: [id number] Attending Physician: Modena Jansky, MD Primary Care Physician: Nolene Ebbs, MD Admit Date: 05/14/2017  Reason for Consultation/Follow-up: Non pain symptom management and Psychosocial/spiritual support  Subjective: Katherine Cooper is much more awake and interactive today. She was oriented to person, place, and situation. Her only complaint was right hip soreness, which was improved with position change. She did take a sip of ice water for me, but refused all food and supplement drinks. No family at the bedside.   Length of Stay: 9  Current Medications: Scheduled Meds:  . aspirin EC  81 mg Oral Daily  . clotrimazole   Topical BID  . donepezil  10 mg Oral QHS  . feeding supplement (ENSURE ENLIVE)  237 mL Oral TID BM  . fluticasone  2 spray Each Nare Daily  . loratadine  10 mg Oral Daily  . mouth rinse  15 mL Mouth Rinse BID  . memantine  28 mg Oral BH-q7a  . mirtazapine  15 mg Oral QHS  . multivitamin with minerals  1 tablet Oral Daily  . pantoprazole  40 mg Oral Daily  . phosphorus  500 mg Oral TID WC & HS  . polyethylene glycol  17 g Oral Daily  . senna-docusate  2 tablet Oral QHS   Continuous Infusions:  PRN Meds: acetaminophen, HYDROcodone-acetaminophen, ondansetron **OR** ondansetron (ZOFRAN) IV  Physical Exam  Constitutional: She is oriented to person, place, and time. She appears well-developed and well-nourished. She has a sickly appearance. No distress.  HENT:  Head: Normocephalic and atraumatic.  Mouth/Throat: Mucous membranes are dry. No oropharyngeal exudate.  Eyes: EOM are normal.  Keeps right eye closed, but can open it on request. States: "I close it by habit."  Neck: Normal range of motion.  Neck supple.  Cardiovascular: Regular rhythm.  Tachycardia present.   Pulmonary/Chest: Effort normal. No respiratory distress.  Abdominal: Soft. Normal appearance and bowel sounds are normal.  Musculoskeletal: She exhibits edema (dependent edema in BUE).  Significant generalized weakness. All extremities can move against gravity with great effort.  Neurological: She is alert and oriented to person, place, and time.  Conversant and oriented to person, place, and situation. Difficulty responding to complex questions, but consistent responses to simple ones. Follows simple commands with verbal cues.   Skin: Skin is warm and dry.  Extremities cool, but pulses intact and no skin mottling noted.  Psychiatric: She has a normal mood and affect. Thought content normal. Her speech is delayed. She is slowed.              Vital Signs: BP 113/84 (BP Location: Right Arm)   Pulse (!) 123   Temp 99.3 F (37.4 C) (Oral)   Resp 18   Ht 5\' 4"  (1.626 m)   Wt 74.6 kg (164 lb 6.4 oz)   SpO2 98%   BMI 28.22 kg/m  SpO2: SpO2: 98 % O2 Device: O2 Device: Not Delivered O2 Flow Rate: O2 Flow Rate (L/min): 2 L/min  Intake/output summary:   Intake/Output Summary (Last 24 hours) at 05/23/17 0912 Last data  filed at 05/23/17 8127  Gross per 24 hour  Intake              400 ml  Output                1 ml  Net              399 ml   LBM: Last BM Date: 05/23/17 Baseline Weight: Weight: 90.7 kg (200 lb) Most recent weight: Weight: 74.6 kg (164 lb 6.4 oz)  Palliative Assessment/Data: PPS 20%   Flowsheet Rows     Most Recent Value  Intake Tab  Referral Department  Hospitalist  Unit at Time of Referral  Med/Surg Unit  Palliative Care Primary Diagnosis  Sepsis/Infectious Disease  Date Notified  05/19/17  Palliative Care Type  New Palliative care  Reason for referral  Clarify Goals of Care  Date of Admission  05/14/17  Date first seen by Palliative Care  05/20/17  # of days Palliative referral response  time  1 Day(s)  # of days IP prior to Palliative referral  5  Clinical Assessment  Palliative Performance Scale Score  30%  Pain Max last 24 hours  Not able to report  Pain Min Last 24 hours  Not able to report  Dyspnea Max Last 24 Hours  Not able to report  Dyspnea Min Last 24 hours  Not able to report  Nausea Max Last 24 Hours  Not able to report  Nausea Min Last 24 Hours  Not able to report  Anxiety Max Last 24 Hours  Not able to report  Anxiety Min Last 24 Hours  Not able to report  Other Max Last 24 Hours  Not able to report  Psychosocial & Spiritual Assessment  Palliative Care Outcomes  Patient/Family meeting held?  No  Palliative Care Outcomes  Improved pain interventions  Palliative Care follow-up planned  Yes, Facility      Patient Active Problem List   Diagnosis Date Noted  . Goals of care, counseling/discussion   . Palliative care by specialist   . Pressure injury of skin 05/15/2017  . Acute encephalopathy 05/14/2017  . Face, Legs, Activity, Cry, Consolability (FLACC) pain assessment scale cry score 2, crying steadily, screams or sobs, frequent complaints 05/14/2017  . Hypercalcemia 05/14/2017  . Alzheimer's dementia, late onset, with behavioral disturbance 02/06/2017  . Leukocytosis 02/06/2017  . Leg swelling 02/06/2017  . Normocytic anemia 02/06/2017  . Acute renal failure (ARF) ([facility]) 02/05/2017  . Chest pain 10/12/2013  . Palpitations 10/12/2013  . Acute bronchitis 10/12/2013  . ARF (acute renal failure) ([place]) 10/12/2013  . SOB (shortness of breath) 05/26/2013  . Breast cancer ([place]) 10/22/2012  . Dehydration 07/22/2012  . Chronic pain 07/22/2012  . Anemia 07/22/2012  . Hypotension 07/21/2012  . Hip pain 07/21/2012  . Anxiety   . Elbow pain 07/09/2012  . PNA (pneumonia) 07/08/2012  . Hyperkalemia 07/08/2012  . Essential hypertension 07/08/2012  . CKD (chronic kidney disease) stage 4, GFR 15-29 ml/min (HCC) 07/08/2012  . Chest pain at rest 11/12/2011     Class: Acute  . Bilateral leg edema 11/12/2011    Class: Chronic  . Obesity (BMI 30-39.9) 11/12/2011    Class: Chronic  . Gout 11/12/2011    Class: Chronic    Palliative Care Assessment & Plan   HPI: 76 y.o. female  with past medical history of Dementia, hypertension, CVA, chronic kidney disease stage III, and GERD. She presented to the ED from SNF with  SOB and confusion, and was admitted on 05/14/2017 with  ESBL UTI, Community acquired pneumonia, and electrolyte abnormalities. Abx course completed and electrolytes improved with intervention. Unfortunately, her intake became progressively poor and now she is not eating or drinking more than a bite/sip or two. At baseline she is bed-bound but normally verbally interactive. Palliative consulted to assist in clarifying goals of care given her dementia and significant decline.   Assessment: I had a family meeting with Katherine Cooper family on 5/29. In that meeting the family made the decision to focus on comfort-related care and transition her to residential hospice. This morning I have found Katherine Cooper much more awake, alert, and interactive. She did take a sip of ice water when offered, however she continues to refuse food or supplement drinks. She feels like she has no appetite, "all food is wrong," and she isn't interested. I could not determine why food was "wrong;" she denies it tasting strange, being difficult to swallow, or the choices being unappetizing. We talked a bit about the importance of eating and drinking to maintain health and strength, however she continues to reiterate her disinterest in eating.  While Katherine Cooper has mentally improved today, she continues to decline food and her fluid intake remains inadequate. I suspect this is related to her underlying dementia. I believe she remains appropriate for residential hospice, as I would expect her to decline quickly without adequate oral intake.   Recommendations/Plan:  DNR, full comfort  care  [facility] hospice evaluation pending  Goals of Care and Additional Recommendations:  Limitations on Scope of Treatment: Full Comfort Care  Code Status:  DNR  Prognosis:   < 2 weeks. If she continues to accept sips of ice water, her time may be weeks, though I would expect <4 weeks.   Discharge Planning:  Hospice facility  Care plan was discussed with pt.  Thank you for allowing the Palliative Medicine Team to assist in the care of this patient.  Total time: 25 minutes    Greater than 50%  of this time was spent counseling and coordinating care related to the above assessment and plan.  Charlynn Court, NP Palliative Medicine Team [phone] pager (7a-5p) Team Phone # [phone]

## 2017-05-23 NOTE — Progress Notes (Signed)
PROGRESS NOTE   Katherine Cooper  CWC:[id number]    DOB: 05-18-1941    DOA: 05/14/2017  PCP: Nolene Ebbs, MD   I have briefly reviewed patients previous medical records in [facility].  Brief Narrative:  76 year old female with PMH of dementia, HTN, CVA, obesity, stage III chronic kidney disease, GERD, admitted for confusion and found to have UTI and CAP, treated with IV antibiotics but continued to have poor oral intake and failure to thrive. Discussed with family and transitioned to full comfort care and the waiting residential hospice.   Assessment & Plan:   Principal Problem:   Acute encephalopathy Active Problems:   Obesity (BMI 30-39.9)   Essential hypertension   CKD (chronic kidney disease) stage 4, GFR 15-29 ml/min (HCC)   Dehydration   Acute renal failure (ARF) (HCC)   Alzheimer's dementia, late onset, with behavioral disturbance   Face, Legs, Activity, Cry, Consolability (FLACC) pain assessment scale cry score 2, crying steadily, screams or sobs, frequent complaints   Hypercalcemia   Pressure injury of skin   Palliative care by specialist   Goals of care, counseling/discussion   Terminal care   1. ESBL Klebsiella UTI: IV Zosyn >IV meropenem >fosfomycin. Completed treatment. 2. Community acquired pneumonia: Completed 7 days of IV antibiotics. Completed treatment. 3. One of 2 blood cultures positive for coagulase-negative staph: Likely contaminant. 4. Acute encephalopathy: Secondary to acute illness including infections, acute kidney injury, hypernatremia complicating underlying dementia. 5. Acute on stage III chronic kidney disease: Creatinine has normalized. 6. Dehydration with hypernatremia: Hypernatremia resolved. At risk for recurrent dehydration due to inconsistent oral intake due to mental status changes. 7. Anemia: Stable 8. Alzheimer's dementia: Discussion as above regarding mental status. 9. Hypercalcemia: Improved. 10. Stage II pressure injury of  right foot and heel: Supportive care. 11. Essential hypertension: Controlled. 12. Hypokalemia: Now on comfort care and not replacing. 13. Hypophosphatemia: Replaced. 17. Adult failure to thrive: Multifactorial secondary to multiple comorbidities contributing to advanced age and dementia. Poor oral intake. Palliative care team met with family on 5/29 and transition to full comfort care and awaiting residential hospice.   DVT prophylaxis: Subcutaneous heparin Code Status: DO NOT RESUSCITATE Family Communication: None at bedside Disposition: DC to residential hospice when bed available.   Consultants:  Palliative care team    Procedures:  None  Antimicrobials:  Completed    Subjective: Pleasantly confused. No pain reported.   ROS: Unable to obtain.  Objective:  Vitals:   05/22/17 1327 05/22/17 2238 05/23/17 0608 05/23/17 1337  BP: (!) 91/56 102/76 113/84 113/80  Pulse: (!) 107 (!) 122 (!) 123 (!) 114  Resp: _0 Temp: 98.3 F (36.8 C) 98 F (36.7 C) 99.3 F (37.4 C) 99.8 F (37.7 C)  TempSrc: Oral Oral Oral Oral  SpO2: 100% 99% 98% 96%  Weight:      Height:        Examination:  General exam: Chronically ill-looking pleasant elderly female lying comfortably in bed. Respiratory system: Clear to auscultation. Respiratory effort normal. Cardiovascular system: S1 & S2 heard, RRR. No JVD, murmurs, rubs, gallops or clicks. No pedal edema. Gastrointestinal system: Abdomen is nondistended, soft and nontender. No organomegaly or masses felt. Normal bowel sounds heard. Central nervous system: Alert and oriented only to self. No focal neurological deficits. Extremities: Symmetric 5 x 5 power. Psychiatry: Judgement and insight impaired. Mood & affect flat    Data Reviewed: I have personally reviewed following labs and imaging studies  CBC:  Recent Labs Lab 05/18/17 0501 05/19/17 0558 05/20/17 0800 05/21/17 0520 05/22/17 0602  WBC 9.8 8.9 8.9 9.6 10.0  HGB  10.1* 9.6* 10.8* 9.8* 9.9*  HCT 34.4* 33.6* 36.0 32.7* 33.5*  MCV 85.6 85.7 84.1 83.2 83.5  PLT 300 259 238 220 790   Basic Metabolic Panel:  Recent Labs Lab 05/18/17 0501 05/18/17 0802  05/19/17 0551 05/19/17 0558 05/19/17 1126 05/20/17 0800 05/21/17 0520 05/22/17 0602  NA 152* 153*  < > 149* 149* 145 140 141 145  K 3.2* 3.1*  < > 2.9* 2.9* 3.1* 3.2* 3.4* 3.1*  CL 120* 122*  < > 117* 118* 116* 111 110 111  CO2 23 22  < > 21* 22 23 20* 22 23  GLUCOSE 89 95  < > 125* 126* 123* 115* 90 85  BUN 46* 43*  < > 33* 33* 31* 22* 20 18  CREATININE 1.08* 1.10*  < > 0.96 1.00 0.94 0.88 0.92 0.97  CALCIUM 10.6* 10.5*  < > 10.1 10.2 9.9 10.0 9.9 9.8  MG  --  1.9  --   --  1.6*  --   --   --   --   PHOS 1.4*  --   --  2.1*  --   --  1.5* 2.4* 3.0  < > = values in this interval not displayed. Liver Function Tests:  Recent Labs Lab 05/18/17 0501 05/19/17 0551 05/20/17 0800 05/21/17 0520 05/22/17 0602  ALBUMIN 2.0* 2.0* 2.1* 1.9* 1.9*     Recent Results (from the past 240 hour(s))  Blood culture (routine x 2)     Status: None   Collection Time: 05/14/17  3:30 PM  Result Value Ref Range Status   Specimen Description BLOOD LEFT ANTECUBITAL  Final   Special Requests   Final    BOTTLES DRAWN AEROBIC AND ANAEROBIC Blood Culture adequate volume   Culture NO GROWTH 5 DAYS  Final   Report Status 05/19/2017 FINAL  Final  Urine culture     Status: Abnormal   Collection Time: 05/14/17  4:45 PM  Result Value Ref Range Status   Specimen Description URINE, CATHETERIZED  Final   Special Requests NONE  Final   Culture (A)  Final    >=100,000 COLONIES/mL KLEBSIELLA PNEUMONIAE Confirmed Extended Spectrum Beta-Lactamase Producer (ESBL)    Report Status 05/16/2017 FINAL  Final   Organism ID, Bacteria KLEBSIELLA PNEUMONIAE (A)  Final      Susceptibility   Klebsiella pneumoniae - MIC*    AMPICILLIN >=32 RESISTANT Resistant     CEFAZOLIN >=64 RESISTANT Resistant     CEFTRIAXONE >=64 RESISTANT  Resistant     CIPROFLOXACIN >=4 RESISTANT Resistant     GENTAMICIN >=16 RESISTANT Resistant     IMIPENEM <=0.25 SENSITIVE Sensitive     NITROFURANTOIN 64 INTERMEDIATE Intermediate     TRIMETH/SULFA 80 RESISTANT Resistant     AMPICILLIN/SULBACTAM >=32 RESISTANT Resistant     PIP/TAZO >=128 RESISTANT Resistant     Extended ESBL POSITIVE Resistant     * >=100,000 COLONIES/mL KLEBSIELLA PNEUMONIAE  Blood culture (routine x 2)     Status: Abnormal   Collection Time: 05/14/17  4:55 PM  Result Value Ref Range Status   Specimen Description BLOOD RIGHT HAND  Final   Special Requests   Final    BOTTLES DRAWN AEROBIC ONLY Blood Culture adequate volume   Culture  Setup Time   Final    GRAM POSITIVE COCCI IN CLUSTERS AEROBIC BOTTLE ONLY CRITICAL RESULT CALLED  TO, READ BACK BY AND VERIFIED WITH: J. Carney Pharm.D. 17:40 05/15/17 ([email or web address])    Culture (A)  Final    STAPHYLOCOCCUS SPECIES (COAGULASE NEGATIVE) THE SIGNIFICANCE OF ISOLATING THIS ORGANISM FROM A SINGLE SET OF BLOOD CULTURES WHEN MULTIPLE SETS ARE DRAWN IS UNCERTAIN. PLEASE NOTIFY THE MICROBIOLOGY DEPARTMENT WITHIN ONE WEEK IF SPECIATION AND SENSITIVITIES ARE REQUIRED.    Report Status 05/17/2017 FINAL  Final  Blood Culture ID Panel (Reflexed)     Status: Abnormal   Collection Time: 05/14/17  4:55 PM  Result Value Ref Range Status   Enterococcus species NOT DETECTED NOT DETECTED Final   Listeria monocytogenes NOT DETECTED NOT DETECTED Final   Staphylococcus species DETECTED (A) NOT DETECTED Final    Comment: Methicillin (oxacillin) susceptible coagulase negative staphylococcus. Possible blood culture contaminant (unless isolated from more than one blood culture draw or clinical case suggests pathogenicity). No antibiotic treatment is indicated for blood  culture contaminants. CRITICAL RESULT CALLED TO, READ BACK BY AND VERIFIED WITH: J. Carney Pharm.D. 17:40 05/15/17 ([email or web address])    Staphylococcus aureus NOT DETECTED NOT DETECTED Final    Methicillin resistance NOT DETECTED NOT DETECTED Final   Streptococcus species NOT DETECTED NOT DETECTED Final   Streptococcus agalactiae NOT DETECTED NOT DETECTED Final   Streptococcus pneumoniae NOT DETECTED NOT DETECTED Final   Streptococcus pyogenes NOT DETECTED NOT DETECTED Final   Acinetobacter baumannii NOT DETECTED NOT DETECTED Final   Enterobacteriaceae species NOT DETECTED NOT DETECTED Final   Enterobacter cloacae complex NOT DETECTED NOT DETECTED Final   Escherichia coli NOT DETECTED NOT DETECTED Final   Klebsiella oxytoca NOT DETECTED NOT DETECTED Final   Klebsiella pneumoniae NOT DETECTED NOT DETECTED Final   Proteus species NOT DETECTED NOT DETECTED Final   Serratia marcescens NOT DETECTED NOT DETECTED Final   Haemophilus influenzae NOT DETECTED NOT DETECTED Final   Neisseria meningitidis NOT DETECTED NOT DETECTED Final   Pseudomonas aeruginosa NOT DETECTED NOT DETECTED Final   Candida albicans NOT DETECTED NOT DETECTED Final   Candida glabrata NOT DETECTED NOT DETECTED Final   Candida krusei NOT DETECTED NOT DETECTED Final   Candida parapsilosis NOT DETECTED NOT DETECTED Final   Candida tropicalis NOT DETECTED NOT DETECTED Final  MRSA PCR Screening     Status: Abnormal   Collection Time: 05/14/17  9:00 PM  Result Value Ref Range Status   MRSA by PCR POSITIVE (A) NEGATIVE Final    Comment:        The GeneXpert MRSA Assay (FDA approved for NASAL specimens only), is one component of a comprehensive MRSA colonization surveillance program. It is not intended to diagnose MRSA infection nor to guide or monitor treatment for MRSA infections. RESULT CALLED TO, READ BACK BY AND VERIFIED WITH: I.MASLENJAK,RN AT 0157 BY L.PITT 05/15/17          Radiology Studies: No results found.      Scheduled Meds: . aspirin EC  81 mg Oral Daily  . clotrimazole   Topical BID  . donepezil  10 mg Oral QHS  . feeding supplement (ENSURE ENLIVE)  237 mL Oral TID BM  .  fluticasone  2 spray Each Nare Daily  . loratadine  10 mg Oral Daily  . mouth rinse  15 mL Mouth Rinse BID  . memantine  28 mg Oral BH-q7a  . mirtazapine  15 mg Oral QHS  . multivitamin with minerals  1 tablet Oral Daily  . pantoprazole  40 mg Oral Daily  . phosphorus  500 mg Oral TID  WC & HS  . polyethylene glycol  17 g Oral Daily  . senna-docusate  2 tablet Oral QHS   Continuous Infusions:   LOS: 9 days     ,, MD, FACP, FHM. [facility] Pager [phone]  If 7PM-7AM, please contact night-coverage [email or web address] Password [email or web address] 05/23/2017, 4:30 PM   

## 2017-05-24 DIAGNOSIS — L89892 Pressure ulcer of other site, stage 2: Secondary | ICD-10-CM | POA: Diagnosis not present

## 2017-05-24 DIAGNOSIS — R627 Adult failure to thrive: Secondary | ICD-10-CM | POA: Diagnosis not present

## 2017-05-24 DIAGNOSIS — N179 Acute kidney failure, unspecified: Secondary | ICD-10-CM | POA: Diagnosis not present

## 2017-05-24 DIAGNOSIS — R531 Weakness: Secondary | ICD-10-CM | POA: Diagnosis not present

## 2017-05-24 DIAGNOSIS — Z515 Encounter for palliative care: Secondary | ICD-10-CM | POA: Diagnosis not present

## 2017-05-24 DIAGNOSIS — M130 Polyarthritis, unspecified: Secondary | ICD-10-CM | POA: Diagnosis not present

## 2017-05-24 DIAGNOSIS — J189 Pneumonia, unspecified organism: Secondary | ICD-10-CM | POA: Diagnosis not present

## 2017-05-24 DIAGNOSIS — G301 Alzheimer's disease with late onset: Secondary | ICD-10-CM | POA: Diagnosis not present

## 2017-05-24 DIAGNOSIS — N390 Urinary tract infection, site not specified: Secondary | ICD-10-CM | POA: Diagnosis not present

## 2017-05-24 DIAGNOSIS — G9340 Encephalopathy, unspecified: Secondary | ICD-10-CM | POA: Diagnosis not present

## 2017-05-24 DIAGNOSIS — I959 Hypotension, unspecified: Secondary | ICD-10-CM | POA: Diagnosis not present

## 2017-05-24 DIAGNOSIS — L89150 Pressure ulcer of sacral region, unstageable: Secondary | ICD-10-CM | POA: Diagnosis not present

## 2017-05-24 DIAGNOSIS — E876 Hypokalemia: Secondary | ICD-10-CM | POA: Diagnosis not present

## 2017-05-24 DIAGNOSIS — N184 Chronic kidney disease, stage 4 (severe): Secondary | ICD-10-CM | POA: Diagnosis not present

## 2017-05-24 DIAGNOSIS — F0281 Dementia in other diseases classified elsewhere with behavioral disturbance: Secondary | ICD-10-CM | POA: Diagnosis not present

## 2017-05-24 DIAGNOSIS — G8929 Other chronic pain: Secondary | ICD-10-CM | POA: Diagnosis not present

## 2017-05-24 DIAGNOSIS — E860 Dehydration: Secondary | ICD-10-CM | POA: Diagnosis not present

## 2017-05-24 DIAGNOSIS — I10 Essential (primary) hypertension: Secondary | ICD-10-CM | POA: Diagnosis not present

## 2017-05-24 DIAGNOSIS — L89810 Pressure ulcer of head, unstageable: Secondary | ICD-10-CM | POA: Diagnosis not present

## 2017-05-24 DIAGNOSIS — M6281 Muscle weakness (generalized): Secondary | ICD-10-CM | POA: Diagnosis not present

## 2017-05-24 DIAGNOSIS — Z7189 Other specified counseling: Secondary | ICD-10-CM | POA: Diagnosis not present

## 2017-05-24 MED ORDER — HYDROCODONE-ACETAMINOPHEN 5-325 MG PO TABS: 1.0000 | ORAL_TABLET | Freq: Three times a day (TID) | ORAL | 0 refills | Status: AC | PRN

## 2017-05-24 MED ORDER — ENSURE ENLIVE PO LIQD: 237.0000 mL | Freq: Three times a day (TID) | ORAL | Status: DC

## 2017-05-24 NOTE — Progress Notes (Addendum)
Palliative Care  Mrs. Kueker is less interactive today. She will open her eyes to voice and give simple responses to questions, however she is not as conversational as yesterday. Unable to determine orientation as she would only state her name. She does continue to follow simple commands, but now requiring increased verbal cues. She refused food and water this morning, but has been taking her pills.   Physical Exam  Constitutional: Oriented to person only. Does not respond to other orientation questions. She has a sickly appearance. No distress.  HENT:  Head: Normocephalic and atraumatic.  Mouth/Throat: Mucous membranes are dry. No oropharyngeal exudate.  Eyes: EOM are normal.  Neck: Normal range of motion. Neck supple.  Cardiovascular: Regular rhythm. Tachycardia present.  Pulmonary/Chest: Effort normal. No respiratory distress.  Abdominal: Soft. Normal appearance and bowel sounds are normal.  Musculoskeletal: She exhibits edema (dependent edema in BUE).  Significant generalized weakness. Will wiggle her fingers and toes on request. Neurological: She is lethargic. Provides single word responses to simple questions, though does not consistently respond. Follows simple commands with multiple verbal cues. Skin: Skin is warm and dry.  Extremities Warm. Psychiatric: Her speech is delayed. She is slowed.   Plan -[facility] continues to not have beds, and [facility] declined her for residential placement. At this point the best option is to go to SNF with Hospice. She does have both Medicare and Medicaid, and would be covered for Hospice services at SNF if her family was willing to relinquish Medicaid check (per SW). Both SW and myself have left messages for family trying to discuss this option with them. Presently waiting for a call back.   ADDENDUM: Percell Locus with SW received a call back from pt's daughter. She would prefer to not utilize her mother's SS/Medicaid check and instead wants to  proceed with discharge to SNF with Palliative Care. Family will plan to reach out to Hospice after discharge if they feel the need becomes greater.  Charlynn Court AGNP-C Palliative Care [phone]  Total time: 35 minutes Greater than 50%  of this time was spent counseling and coordinating care related to the above assessment and plan.

## 2017-05-24 NOTE — Progress Notes (Signed)
Melanee Spry to be D/C'd to [facility] per MD order.  Informed with the patient and all questions fully answered.  VSS, Skin clean, dry and intact with evidence of skin break down,stage II - stage III noted to buttock. IV catheter intact.  An After Visit Summary was printed and given to the patient. Patient received prescription.  D/c education completed with patient/family including follow up instructions, medication list, d/c activities limitations if indicated, with other d/c instructions as indicated by MD - patient unable to verbalize understanding due to dementia.   Patient instructed to return to ED, call 911, or call MD for any changes in condition.   Patient taken via stretcher & ambulance, and D/C'd to [facility].  Milas Hock 05/24/2017 3:03 PM

## 2017-05-24 NOTE — Progress Notes (Signed)
Patient will DC to: [facility] Anticipated DC date: 05/24/17 Family notified: [facility] by: Corey Harold   Per MD patient ready for DC to [facility]. RN, patient, patient's family, and facility notified of DC. Discharge Summary sent to facility. RN given number for report. DC packet on chart. Ambulance transport requested for patient.   CSW signing off.  Cedric Fishman, [place] Social Worker [phone]

## 2017-05-24 NOTE — Clinical Social Work Placement (Signed)
   CLINICAL SOCIAL WORK PLACEMENT  NOTE  Date:  05/24/2017  Patient Details  Name: Katherine Cooper MRN: [id number] Date of Birth: June 10, 1941  Clinical Social Work is seeking post-discharge placement for this patient at the [place] level of care (*CSW will initial, date and re-position this form in  chart as items are completed):  Yes   Patient/family provided with [place] Work Department's list of facilities offering this level of care within the geographic area requested by the patient (or if unable, by the patient's family).  Yes   Patient/family informed of their freedom to choose among providers that offer the needed level of care, that participate in Medicare, Medicaid or managed care program needed by the patient, have an available bed and are willing to accept the patient.  Yes   Patient/family informed of [facility]'s ownership interest in [facility] and [facility], as well as of the fact that they are under no obligation to receive care at these facilities.  PASRR submitted to EDS on       PASRR number received on       Existing PASRR number confirmed on 05/24/17     FL2 transmitted to all facilities in geographic area requested by pt/family on 05/24/17     FL2 transmitted to all facilities within larger geographic area on       Patient informed that his/her managed care company has contracts with or will negotiate with certain facilities, including the following:        Yes   Patient/family informed of bed offers received.  Patient chooses bed at [facility]     Physician recommends and patient chooses bed at      Patient to be transferred to [facility] on 05/24/17.  Patient to be transferred to facility by [facility]     Patient family notified on 05/24/17 of transfer.  Name of family member notified:  [employer] [facility]       Additional  Comment:    _______________________________________________ Benard Halsted, [place] 05/24/2017, 2:02 PM

## 2017-05-24 NOTE — Discharge Summary (Signed)
Physician Discharge Summary  Katherine Cooper XVQ:[id number] DOB: 1941-06-06  PCP: Nolene Ebbs, MD  Admit date: 05/14/2017 Discharge date: 05/24/2017  Recommendations for Outpatient Follow-up:  1. M.D. at SNF in 1-2 days. 2. Recommend palliative care consultation and follow-up at SNF. 3. Dr. Nolene Ebbs, PCP, as needed.  Home Health: None Equipment/Devices: None    Discharge Condition: Improved, stable but guarded in that risk for decline including death. CODE STATUS: DO NOT RESUSCITATE  Diet recommendation: Regular diet.  Discharge Diagnoses:  Principal Problem:   Acute encephalopathy Active Problems:   Obesity (BMI 30-39.9)   Essential hypertension   CKD (chronic kidney disease) stage 4, GFR 15-29 ml/min (HCC)   Dehydration   Acute renal failure (ARF) (HCC)   Alzheimer's dementia, late onset, with behavioral disturbance   Face, Legs, Activity, Cry, Consolability (FLACC) pain assessment scale cry score 2, crying steadily, screams or sobs, frequent complaints   Hypercalcemia   Pressure injury of skin   Palliative care by specialist   Goals of care, counseling/discussion   Terminal care   Brief Summary: 76 year old female with PMH of dementia, HTN, CVA, obesity, stage III chronic kidney disease, GERD, admitted for confusion and found to have UTI and CAP, treated with IV antibiotics but continued to have poor oral intake and failure to thrive. Palliative care was consulted and family transitioned her to comfort care.   Assessment & Plan:   1. ESBL Klebsiella UTI: IV Zosyn >IV meropenem >fosfomycin. Completed treatment. 2. Community acquired pneumonia: Completed 7 days of IV antibiotics. Completed treatment. 3. One of 2 blood cultures positive for coagulase-negative staph: Likely contaminant. 4. Acute encephalopathy: Secondary to acute illness including infections, acute kidney injury, hypernatremia complicating underlying dementia. Goal of care at this time is  comfort. 5. Acute on stage III chronic kidney disease: Creatinine has normalized. 6. Dehydration with hypernatremia: Hypernatremia resolved. At risk for recurrent dehydration due to inconsistent oral intake due to mental status changes. 7. Anemia: Stable 8. Alzheimer's dementia: Discussion as above regarding mental status. 9. Hypercalcemia: Improved. 10. Stage II pressure injury of right foot and heel: Supportive care. 11. Essential hypertension:  soft to controlled despite being off of medications. Discontinued carvedilol, Maxide and Lasix at discharge. 12. Hypokalemia: Now on comfort care and not replacing. 13. Hypophosphatemia: Replaced. 55. Adult failure to thrive: Multifactorial secondary to multiple comorbidities contributing to advanced age and dementia. Poor oral intake. Palliative care team met with family on 5/29 and transitioned to full comfort care. Discussed with palliative care team today. They're follow-up appreciated. [facility] not have beds and [facility] declined her for residential placement. As per palliative care team and clinical social worker discussion with family, they would prefer not to utilize patient's SS/Medicaid check and instead wanted to proceed with discharge to SNF with palliative care. Family to reach out to hospice after discharge if they feel the need becomes greater.   Consultants:  Palliative care team    Procedures:  None   Discharge Instructions  Discharge Instructions    (HEART FAILURE PATIENTS) Call MD:  Anytime you have any of the following symptoms: 1) 3 pound weight gain in 24 hours or 5 pounds in 1 week 2) shortness of breath, with or without a dry hacking cough 3) swelling in the hands, feet or stomach 4) if you have to sleep on extra pillows at night in order to breathe.    Complete by:  As directed    Call MD for:  difficulty breathing,  headache or visual disturbances    Complete by:  As directed    Call MD for:  extreme  fatigue    Complete by:  As directed    Call MD for:  persistant dizziness or light-headedness    Complete by:  As directed    Call MD for:  persistant nausea and vomiting    Complete by:  As directed    Call MD for:  severe uncontrolled pain    Complete by:  As directed    Call MD for:  temperature >100.4    Complete by:  As directed    Diet general    Complete by:  As directed    Increase activity slowly    Complete by:  As directed        Medication List    STOP taking these medications   carvedilol 3.125 MG tablet Commonly known as:  COREG   clorazepate 7.5 MG tablet Commonly known as:  TRANXENE   furosemide 40 MG tablet Commonly known as:  LASIX   PROMETHAZINE HCL IJ   triamterene-hydrochlorothiazide 37.5-25 MG tablet Commonly known as:  MAXZIDE-25     TAKE these medications   acetaminophen 325 MG tablet Commonly known as:  TYLENOL Take 650 mg by mouth every 8 (eight) hours. 0000, 0800, 1600 What changed:  Another medication with the same name was removed. Continue taking this medication, and follow the directions you see here.   aspirin EC 81 MG tablet Take 81 mg by mouth daily.   cetirizine 10 MG tablet Commonly known as:  ZYRTEC Take 10 mg by mouth daily.   donepezil 10 MG tablet Commonly known as:  ARICEPT Take 10 mg by mouth at bedtime.   feeding supplement (ENSURE ENLIVE) Liqd Take 237 mLs by mouth 3 (three) times daily between meals.   fluticasone 50 MCG/ACT nasal spray Commonly known as:  FLONASE Place 2 sprays into both nostrils daily.   HYDROcodone-acetaminophen 5-325 MG tablet Commonly known as:  NORCO/VICODIN Take 1 tablet by mouth every 8 (eight) hours as needed for moderate pain or severe pain. What changed:  reasons to take this   mirtazapine 15 MG tablet Commonly known as:  REMERON Take 15 mg by mouth at bedtime.   multivitamin with minerals Tabs tablet Take 1 tablet by mouth daily.   NAMENDA XR 28 MG Cp24 24 hr  capsule Generic drug:  memantine Take 1 capsule by mouth every morning.   oxybutynin 5 MG 24 hr tablet Commonly known as:  DITROPAN-XL Take 5 mg by mouth daily.   pantoprazole 40 MG tablet Commonly known as:  PROTONIX Take 40 mg by mouth daily.   phenylephrine-shark liver oil-mineral oil-petrolatum 0.25-3-14-71.9 % rectal ointment Commonly known as:  PREPARATION H Place 1 application rectally 2 (two) times daily as needed for hemorrhoids.   senna-docusate 8.6-50 MG tablet Commonly known as:  Senokot-S Take 2 tablets by mouth at bedtime.   simvastatin 10 MG tablet Commonly known as:  ZOCOR Take 10 mg by mouth at bedtime.       Contact information for follow-up providers    M.D. at SNF. Schedule an appointment as soon as possible for a visit.   Why:  To be seen in 1-2 days. Recommend palliative care consultation and follow-up at SNF.       Nolene Ebbs, MD. Schedule an appointment as soon as possible for a visit.   Specialty:  Internal Medicine Why:  As needed. Contact information: [place]  [place] [address] [phone]            Contact information for after-discharge care    Destination    [facility] Follow up.   Specialty:  [facility] information: [place] [phone]                 Allergies  Allergen Reactions  . Sulfa Antibiotics Itching      Procedures/Studies: Ct Head Wo Contrast  Result Date: 05/14/2017 CLINICAL DATA:  Altered mental status EXAM: CT HEAD WITHOUT CONTRAST TECHNIQUE: Contiguous axial images were obtained from the base of the skull through the vertex without intravenous contrast. COMPARISON:  09/04/2013 FINDINGS: Brain: No evidence of acute infarction, hemorrhage, hydrocephalus, extra-axial collection or mass lesion/mass effect. Mild atrophy. Mild periventricular and deep white matter small vessel ischemic  changes. Vascular: No hyperdense vessels.  Carotid artery calcifications. Skull: Normal. Negative for fracture or focal lesion. Sinuses/Orbits: Minimal mucosal thickening in the ethmoid sinuses. No acute orbital abnormality. Bilateral lens extraction. Other: None IMPRESSION: No CT evidence for acute intracranial abnormality. Electronically Signed   By: Donavan Foil M.D.   On: 05/14/2017 16:11   Dg Chest Portable 1 View  Result Date: 05/14/2017 CLINICAL DATA:  Altered mental status EXAM: PORTABLE CHEST 1 VIEW COMPARISON:  02/05/2017 FINDINGS: Low lung volumes with left basilar atelectasis. No focal consolidation or effusion. Stable enlarged cardiomediastinal silhouette with dilatation and tortuosity of the aortic knob. No pneumothorax. IMPRESSION: Low lung volumes with left basilar atelectasis. Electronically Signed   By: Donavan Foil M.D.   On: 05/14/2017 15:42   Dg Shoulder Left  Result Date: 05/14/2017 CLINICAL DATA:  Left shoulder to formed in with pain EXAM: LEFT SHOULDER - 2+ VIEW COMPARISON:  None. FINDINGS: No fracture or dislocation. Minimal inferior collateral humeral degenerative change and AC joint degenerative change. The left lung apex is clear. IMPRESSION: No acute osseous abnormality. Electronically Signed   By: Donavan Foil M.D.   On: 05/14/2017 18:59   Dg Abd Portable 1v  Result Date: 05/19/2017 CLINICAL DATA:  Constipation EXAM: PORTABLE ABDOMEN - 1 VIEW COMPARISON:  CT abdomen/pelvis dated 09/16/2010 FINDINGS: Nonobstructive bowel gas pattern. Moderate stool in the rectum. Otherwise normal colonic stool burden. Soft tissue calcifications overlying the pelvis. IMPRESSION: Nonobstructive bowel gas pattern. Moderate stool in the rectum. Electronically Signed   By: Julian Hy M.D.   On: 05/19/2017 14:52      Subjective: Lethargic and not very interactive today. To questions, she will just mumble or nod. As per RN, had couple of loose stools and hence MiraLAX and senna were  held this morning.  Discharge Exam:  Vitals:   05/23/17 0608 05/23/17 1337 05/23/17 2243 05/24/17 0552  BP: 113/84 113/80 124/87 97/75  Pulse: (!) 123 (!) 114 (!) 119 (!) 122  Resp: '18 18 18 18  ' Temp: 99.3 F (37.4 C) 99.8 F (37.7 C) 97.7 F (36.5 C) 99 F (37.2 C)  TempSrc: Oral Oral Oral Oral  SpO2: 98% 96% 98% 98%  Weight:      Height:        General exam: Chronically ill-looking pleasant elderly female lying comfortably propped up in bed Respiratory system: Clear to auscultation. Respiratory effort normal. Cardiovascular system: S1 & S2 heard, RRR. No JVD, murmurs, rubs, gallops or clicks. No pedal edema. Gastrointestinal system: Abdomen is nondistended, soft and nontender. No organomegaly or masses felt. Normal bowel sounds heard. Central nervous system:  lethargic  but arousable and opens eyes, mumbles incomprehensively. No focal neurological deficits. Extremities: Moves all extremities symmetrically. Psychiatry: Judgement and insight impaired. Mood & affect flat     The results of significant diagnostics from this hospitalization (including imaging, microbiology, ancillary and laboratory) are listed below for reference.     Microbiology: Recent Results (from the past 240 hour(s))  Blood culture (routine x 2)     Status: None   Collection Time: 05/14/17  3:30 PM  Result Value Ref Range Status   Specimen Description BLOOD LEFT ANTECUBITAL  Final   Special Requests   Final    BOTTLES DRAWN AEROBIC AND ANAEROBIC Blood Culture adequate volume   Culture NO GROWTH 5 DAYS  Final   Report Status 05/19/2017 FINAL  Final  Urine culture     Status: Abnormal   Collection Time: 05/14/17  4:45 PM  Result Value Ref Range Status   Specimen Description URINE, CATHETERIZED  Final   Special Requests NONE  Final   Culture (A)  Final    >=100,000 COLONIES/mL KLEBSIELLA PNEUMONIAE Confirmed Extended Spectrum Beta-Lactamase Producer (ESBL)    Report Status 05/16/2017 FINAL  Final    Organism ID, Bacteria KLEBSIELLA PNEUMONIAE (A)  Final      Susceptibility   Klebsiella pneumoniae - MIC*    AMPICILLIN >=32 RESISTANT Resistant     CEFAZOLIN >=64 RESISTANT Resistant     CEFTRIAXONE >=64 RESISTANT Resistant     CIPROFLOXACIN >=4 RESISTANT Resistant     GENTAMICIN >=16 RESISTANT Resistant     IMIPENEM <=0.25 SENSITIVE Sensitive     NITROFURANTOIN 64 INTERMEDIATE Intermediate     TRIMETH/SULFA 80 RESISTANT Resistant     AMPICILLIN/SULBACTAM >=32 RESISTANT Resistant     PIP/TAZO >=128 RESISTANT Resistant     Extended ESBL POSITIVE Resistant     * >=100,000 COLONIES/mL KLEBSIELLA PNEUMONIAE  Blood culture (routine x 2)     Status: Abnormal   Collection Time: 05/14/17  4:55 PM  Result Value Ref Range Status   Specimen Description BLOOD RIGHT HAND  Final   Special Requests   Final    BOTTLES DRAWN AEROBIC ONLY Blood Culture adequate volume   Culture  Setup Time   Final    GRAM POSITIVE COCCI IN CLUSTERS AEROBIC BOTTLE ONLY CRITICAL RESULT CALLED TO, READ BACK BY AND VERIFIED WITH: J. Carney Pharm.D. 17:40 05/15/17 ([email or web address])    Culture (A)  Final    STAPHYLOCOCCUS SPECIES (COAGULASE NEGATIVE) THE SIGNIFICANCE OF ISOLATING THIS ORGANISM FROM A SINGLE SET OF BLOOD CULTURES WHEN MULTIPLE SETS ARE DRAWN IS UNCERTAIN. PLEASE NOTIFY THE MICROBIOLOGY DEPARTMENT WITHIN ONE WEEK IF SPECIATION AND SENSITIVITIES ARE REQUIRED.    Report Status 05/17/2017 FINAL  Final  Blood Culture ID Panel (Reflexed)     Status: Abnormal   Collection Time: 05/14/17  4:55 PM  Result Value Ref Range Status   Enterococcus species NOT DETECTED NOT DETECTED Final   Listeria monocytogenes NOT DETECTED NOT DETECTED Final   Staphylococcus species DETECTED (A) NOT DETECTED Final    Comment: Methicillin (oxacillin) susceptible coagulase negative staphylococcus. Possible blood culture contaminant (unless isolated from more than one blood culture draw or clinical case suggests pathogenicity). No antibiotic  treatment is indicated for blood  culture contaminants. CRITICAL RESULT CALLED TO, READ BACK BY AND VERIFIED WITH: J. Carney Pharm.D. 17:40 05/15/17 ([email or web address])    Staphylococcus aureus NOT DETECTED NOT DETECTED Final   Methicillin resistance NOT DETECTED NOT DETECTED Final   Streptococcus species NOT DETECTED NOT DETECTED Final  Streptococcus agalactiae NOT DETECTED NOT DETECTED Final   Streptococcus pneumoniae NOT DETECTED NOT DETECTED Final   Streptococcus pyogenes NOT DETECTED NOT DETECTED Final   Acinetobacter baumannii NOT DETECTED NOT DETECTED Final   Enterobacteriaceae species NOT DETECTED NOT DETECTED Final   Enterobacter cloacae complex NOT DETECTED NOT DETECTED Final   Escherichia coli NOT DETECTED NOT DETECTED Final   Klebsiella oxytoca NOT DETECTED NOT DETECTED Final   Klebsiella pneumoniae NOT DETECTED NOT DETECTED Final   Proteus species NOT DETECTED NOT DETECTED Final   Serratia marcescens NOT DETECTED NOT DETECTED Final   Haemophilus influenzae NOT DETECTED NOT DETECTED Final   Neisseria meningitidis NOT DETECTED NOT DETECTED Final   Pseudomonas aeruginosa NOT DETECTED NOT DETECTED Final   Candida albicans NOT DETECTED NOT DETECTED Final   Candida glabrata NOT DETECTED NOT DETECTED Final   Candida krusei NOT DETECTED NOT DETECTED Final   Candida parapsilosis NOT DETECTED NOT DETECTED Final   Candida tropicalis NOT DETECTED NOT DETECTED Final  MRSA PCR Screening     Status: Abnormal   Collection Time: 05/14/17  9:00 PM  Result Value Ref Range Status   MRSA by PCR POSITIVE (A) NEGATIVE Final    Comment:        The GeneXpert MRSA Assay (FDA approved for NASAL specimens only), is one component of a comprehensive MRSA colonization surveillance program. It is not intended to diagnose MRSA infection nor to guide or monitor treatment for MRSA infections. RESULT CALLED TO, READ BACK BY AND VERIFIED WITH: I.[email or web address],RN AT 0157 BY L.PITT 05/15/17       Labs: CBC:  Recent Labs Lab 05/18/17 0501 05/19/17 0558 05/20/17 0800 05/21/17 0520 05/22/17 0602  WBC 9.8 8.9 8.9 9.6 10.0  HGB 10.1* 9.6* 10.8* 9.8* 9.9*  HCT 34.4* 33.6* 36.0 32.7* 33.5*  MCV 85.6 85.7 84.1 83.2 83.5  PLT 300 259 238 220 916   Basic Metabolic Panel:  Recent Labs Lab 05/18/17 0501 05/18/17 0802  05/19/17 0551 05/19/17 0558 05/19/17 1126 05/20/17 0800 05/21/17 0520 05/22/17 0602  NA 152* 153*  < > 149* 149* 145 140 141 145  K 3.2* 3.1*  < > 2.9* 2.9* 3.1* 3.2* 3.4* 3.1*  CL 120* 122*  < > 117* 118* 116* 111 110 111  CO2 23 22  < > 21* 22 23 20* 22 23  GLUCOSE 89 95  < > 125* 126* 123* 115* 90 85  BUN 46* 43*  < > 33* 33* 31* 22* 20 18  CREATININE 1.08* 1.10*  < > 0.96 1.00 0.94 0.88 0.92 0.97  CALCIUM 10.6* 10.5*  < > 10.1 10.2 9.9 10.0 9.9 9.8  MG  --  1.9  --   --  1.6*  --   --   --   --   PHOS 1.4*  --   --  2.1*  --   --  1.5* 2.4* 3.0  < > = values in this interval not displayed. Liver Function Tests:  Recent Labs Lab 05/18/17 0501 05/19/17 0551 05/20/17 0800 05/21/17 0520 05/22/17 0602  ALBUMIN 2.0* 2.0* 2.1* 1.9* 1.9*   BNP (last 3 results)  Recent Labs  02/05/17 2055 05/14/17 1530  BNP 54.6 74.9    Urinalysis    Component Value Date/Time   COLORURINE AMBER (A) 05/14/2017 1645   APPEARANCEUR CLOUDY (A) 05/14/2017 1645   LABSPEC 1.016 05/14/2017 1645   PHURINE 5.0 05/14/2017 1645   GLUCOSEU NEGATIVE 05/14/2017 1645   HGBUR SMALL (A) 05/14/2017 1645  BILIRUBINUR NEGATIVE 05/14/2017 [place] 05/14/2017 1645   PROTEINUR 30 (A) 05/14/2017 1645   UROBILINOGEN 0.2 04/29/2015 0628   NITRITE NEGATIVE 05/14/2017 1645   LEUKOCYTESUR LARGE (A) 05/14/2017 1645      Time coordinating discharge: Over 30 minutes  SIGNED:  Vernell Leep, MD, FACP, FHM. [facility] Pager [phone]  If 7PM-7AM, please contact night-coverage [email or web address] Password [facility] 05/24/2017, 12:54 PM

## 2017-05-28 ENCOUNTER — Encounter: Payer: Self-pay | Admitting: Adult Health

## 2017-05-28 ENCOUNTER — Non-Acute Institutional Stay (SKILLED_NURSING_FACILITY): Payer: Medicare Other | Admitting: Adult Health

## 2017-05-28 DIAGNOSIS — M6281 Muscle weakness (generalized): Secondary | ICD-10-CM | POA: Diagnosis not present

## 2017-05-28 DIAGNOSIS — E876 Hypokalemia: Secondary | ICD-10-CM | POA: Insufficient documentation

## 2017-05-28 DIAGNOSIS — R627 Adult failure to thrive: Secondary | ICD-10-CM | POA: Insufficient documentation

## 2017-05-28 DIAGNOSIS — F0281 Dementia in other diseases classified elsewhere with behavioral disturbance: Secondary | ICD-10-CM | POA: Diagnosis not present

## 2017-05-28 DIAGNOSIS — I959 Hypotension, unspecified: Secondary | ICD-10-CM

## 2017-05-28 DIAGNOSIS — M130 Polyarthritis, unspecified: Secondary | ICD-10-CM | POA: Diagnosis not present

## 2017-05-28 DIAGNOSIS — N3281 Overactive bladder: Secondary | ICD-10-CM | POA: Diagnosis not present

## 2017-05-28 DIAGNOSIS — G8929 Other chronic pain: Secondary | ICD-10-CM

## 2017-05-28 DIAGNOSIS — D631 Anemia in chronic kidney disease: Secondary | ICD-10-CM | POA: Insufficient documentation

## 2017-05-28 DIAGNOSIS — K219 Gastro-esophageal reflux disease without esophagitis: Secondary | ICD-10-CM

## 2017-05-28 DIAGNOSIS — N184 Chronic kidney disease, stage 4 (severe): Secondary | ICD-10-CM

## 2017-05-28 DIAGNOSIS — F02818 Dementia in other diseases classified elsewhere, unspecified severity, with other behavioral disturbance: Secondary | ICD-10-CM

## 2017-05-28 DIAGNOSIS — G301 Alzheimer's disease with late onset: Secondary | ICD-10-CM

## 2017-05-28 HISTORY — DX: Gastro-esophageal reflux disease without esophagitis: K21.9

## 2017-05-28 NOTE — Progress Notes (Signed)
Location:   [facility] Room Number: 146 A Place of Service:  SNF (31)   CODE STATUS: Full Code  Allergies  Allergen Reactions  . Sulfa Antibiotics Itching    Chief Complaint  Patient presents with  . Hospitalization Follow-up    Hospital Follow up    HPI:  She has a history of cva; dementia; hypertension; chronic kidney disease stage III. She was hospitalized for UTI and pneumonia. She has been treated with IV abt and has completed her treatment. She does have poor fluid intake and has failure to thrive. She is to be followed by palliative care and will transition to hospice care. Her family had looked into [facility]. More than likely this does represent a long term placement for her    Past Medical History:  Diagnosis Date  . Alzheimer disease   . Alzheimer's dementia, late onset, with behavioral disturbance 02/06/2017  . Anemia   . Angina   . Anxiety   . Arthritis   . Blood transfusion   . Breast cancer ([place]) 10/22/2012  . CAP (community acquired pneumonia)   . Chronic kidney disease   . DDD (degenerative disc disease), lumbar   . Dementia   . Dysrhythmia   . Gout   . Headache(784.0)   . Heart murmur   . Hypertension   . Hypothyroidism   . Obesity   . Recurrent upper respiratory infection (URI)   . Shortness of breath 05/26/2013  . Stroke [facility])     Past Surgical History:  Procedure Laterality Date  . UTERINE FIBROID SURGERY      Social History   Social History  . Marital status: Widowed    Spouse name: N/A  . Number of children: N/A  . Years of education: N/A   Occupational History  . Not on file.   Social History Main Topics  . Smoking status: Former Smoker    Packs/day: 0.25    Types: Cigarettes    Quit date: 03/13/2012  . Smokeless tobacco: Never Used  . Alcohol use No  . Drug use: No  . Sexual activity: No   Other Topics Concern  . Not on file   Social History Narrative  . No narrative on  file   Family History  Problem Relation Age of Onset  . Asthma Mother   . Heart attack Father   . Kidney failure Brother       VITAL SIGNS BP 98/62   Pulse (!) 124   Temp 98.4 F (36.9 C)   Resp 16   Ht 5\' 4"  (1.626 m)   Wt 164 lb 6 oz (74.6 kg)   SpO2 97%   BMI 28.21 kg/m    Patient's Medications  New Prescriptions   No medications on file  Previous Medications   ASPIRIN EC 81 MG TABLET    Take 81 mg by mouth daily.   CETIRIZINE (ZYRTEC) 10 MG TABLET    Take 10 mg by mouth daily.    DONEPEZIL (ARICEPT) 10 MG TABLET    Take 10 mg by mouth at bedtime.    FLUTICASONE (FLONASE) 50 MCG/ACT NASAL SPRAY    Place 2 sprays into both nostrils daily.    HYDROCODONE-ACETAMINOPHEN (NORCO/VICODIN) 5-325 MG TABLET    Take 1 tablet by mouth every 8 (eight) hours as needed for moderate pain or severe pain.   MEMANTINE HCL ER (NAMENDA XR) 28 MG CP24    Take 1 capsule by mouth  every morning.    MIRTAZAPINE (REMERON) 15 MG TABLET    Take 15 mg by mouth at bedtime.   MULTIPLE VITAMIN (MULTIVITAMIN WITH MINERALS) TABS TABLET    Take 1 tablet by mouth daily.   NUTRITIONAL SUPPLEMENT LIQD    Give 240cc three times a day between meals   OXYBUTYNIN (DITROPAN-XL) 5 MG 24 HR TABLET    Take 5 mg by mouth daily.   PANTOPRAZOLE (PROTONIX) 40 MG TABLET    Take 40 mg by mouth daily.    PHENYLEPHRINE-SHARK LIVER OIL-MINERAL OIL-PETROLATUM (PREPARATION H) 0.25-3-14-71.9 % RECTAL OINTMENT    Place 1 application rectally 2 (two) times daily as needed for hemorrhoids.   SENNA-DOCUSATE (SENOKOT-S) 8.6-50 MG TABLET    Take 2 tablets by mouth at bedtime.   SIMVASTATIN (ZOCOR) 10 MG TABLET    Take 10 mg by mouth at bedtime.  Modified Medications   No medications on file  Discontinued Medications   ACETAMINOPHEN (TYLENOL) 325 MG TABLET    Take 650 mg by mouth every 8 (eight) hours. 0000, 0800, 1600   FEEDING SUPPLEMENT, ENSURE ENLIVE, (ENSURE ENLIVE) LIQD    Take 237 mLs by mouth 3 (three) times daily between  meals.     SIGNIFICANT DIAGNOSTIC EXAMS  05-14-17: chest x-ray: Low lung volumes with left basilar atelectasis  05-14-17: ct of head: No CT evidence for acute intracranial abnormality.  05-14-17: left shoulder x-ray: No acute osseous abnormality.   05-19-17: kub: Nonobstructive bowel gas pattern. Moderate stool in the rectum.  LABS REVIEWED:   05-14-17: wbc 9.9; hgb 10.9; hct 36.1; mcv 82.8; plt 344; glucose 117; bun 132; creat 2.74; k+ 4.3; na++ 135; ca 11.2; liver normal albumin 2.4; mag 3.0; phos 4.8; tsh 1.589; urine culture: klebsiella pneumoniae  05-19-17; wbc 9.8; hgb 10.1; hct 34.3; mcv 85.6; plt 300; glucose 95; bun 43; creat 1.10; k+ 3.1; na++ 153; ca 10.5; mag 1.9; osmolality 330 05-20-17: wbc 8.9; hgb 10.8; hct 36.0; mcv 84.1; plt 238; glucose 90; bun 20; creat 0.92; k+ 3.4; na++ 141; ca 9.9; phos 2.4; albumin 1.9  05-22-17: wbc 10.0; hgb 9.9; hct 33.5; mcv 83.5; plt 241; glucose 85; bun 18 ;creat 0.97; k+ 3.1 ;na++ 145; ca 9.8    Review of Systems  Unable to perform ROS: Other (sleepy )    Physical Exam  Constitutional: She appears well-nourished. No distress.  Eyes: Conjunctivae are normal.  Neck: Neck supple. No JVD present. No thyromegaly present.  Cardiovascular: Regular rhythm, normal heart sounds and intact distal pulses.   Tachycardia   Respiratory: Effort normal and breath sounds normal. No respiratory distress. She has no wheezes.  GI: Soft. Bowel sounds are normal. She exhibits no distension. There is no tenderness.  Musculoskeletal: She exhibits no edema.  Has left side weakness   Lymphadenopathy:    She has no cervical adenopathy.  Neurological: She is alert.  Skin: Skin is warm and dry. She is not diaphoretic.  Psychiatric:  Sleepy      ASSESSMENT/ PLAN:  1. Hypotension: b/p 98/62: will continue to monitor   2. CVA: is neurologically stable will continue asa 81 mg daily   3. Allergic rhinitis: will continue zyrtec 10 mg daily and flonase daily    4. Dyslipidemia: will continue zocor 10 mg daily   5. Gerd: will continue protonix 40 mg daily  6. Overactive bladder: will continue ditropan xl 5 mg daily   7. Constipation: will continue senna s 2 tabs daily   8. Alzheimer's disease:  weight is 164 pounds; will continue aricept 10 mg daily and namenda xr 28 mg daily   9. Depression: will continue remeron 15 mg nightly;   10. Failure to thrive: weight is 164 pounds; albumin 1.9; will continue supplements per facility protocol and will begin prostat 30 cc twice daily   11. CKD stage IV: bun 18; creat 0.97; will monitor   12. Anemia of chronic disease: hgb 9.9   13. Hypercalcemia: ca level is 9.8 will monitor  14. Hypokalemia: k+ 3.1; will begin k+ 20 meq daily   15. Chronic pain: will yell out during nursing care; will continue vicodin 5/325 mg every 8 hours as needed.    Will check cbc; bmp this week Will setup palliative care consult    Time spent with patient 50  minutes >50% time spent counseling; reviewing medical record; tests; labs; and developing future plan of care   MD is aware of resident's narcotic use and is in agreement with current plan of care. We will attempt to wean resident as apropriate   Ok Edwards NP [facility]  Contact [phone] Monday through Friday 8am- 5pm  After hours call [phone]

## 2017-05-29 ENCOUNTER — Non-Acute Institutional Stay (SKILLED_NURSING_FACILITY): Payer: Medicare Other | Admitting: Internal Medicine

## 2017-05-29 ENCOUNTER — Encounter: Payer: Self-pay | Admitting: Internal Medicine

## 2017-05-29 DIAGNOSIS — I959 Hypotension, unspecified: Secondary | ICD-10-CM | POA: Diagnosis not present

## 2017-05-29 DIAGNOSIS — R627 Adult failure to thrive: Secondary | ICD-10-CM

## 2017-05-29 DIAGNOSIS — G301 Alzheimer's disease with late onset: Secondary | ICD-10-CM | POA: Diagnosis not present

## 2017-05-29 DIAGNOSIS — L89150 Pressure ulcer of sacral region, unstageable: Secondary | ICD-10-CM | POA: Diagnosis not present

## 2017-05-29 DIAGNOSIS — E876 Hypokalemia: Secondary | ICD-10-CM | POA: Diagnosis not present

## 2017-05-29 DIAGNOSIS — N184 Chronic kidney disease, stage 4 (severe): Secondary | ICD-10-CM | POA: Diagnosis not present

## 2017-05-29 DIAGNOSIS — D631 Anemia in chronic kidney disease: Secondary | ICD-10-CM

## 2017-05-29 DIAGNOSIS — L89810 Pressure ulcer of head, unstageable: Secondary | ICD-10-CM | POA: Diagnosis not present

## 2017-05-29 DIAGNOSIS — G8929 Other chronic pain: Secondary | ICD-10-CM

## 2017-05-29 DIAGNOSIS — F0281 Dementia in other diseases classified elsewhere with behavioral disturbance: Secondary | ICD-10-CM

## 2017-05-29 DIAGNOSIS — N3281 Overactive bladder: Secondary | ICD-10-CM | POA: Diagnosis not present

## 2017-05-29 DIAGNOSIS — F02818 Dementia in other diseases classified elsewhere, unspecified severity, with other behavioral disturbance: Secondary | ICD-10-CM

## 2017-05-29 NOTE — Progress Notes (Signed)
Patient ID: Katherine Cooper, female   DOB: 07-Nov-1941, 76 y.o.   MRN: [id number]    HISTORY AND PHYSICAL   DATE: 05/29/2017 Location:    [place] Room Number: 146 A Place of Service: SNF (31)   Extended Emergency Contact Information Primary Emergency Contact: Harle Stanford, [place] [address] [place] Phone: [phone] Relation: Daughter Secondary Emergency Contact: Mark,Michelle Jetty Duhamel [place] Phone: [phone] Relation: Daughter  Advanced Directive information Does Patient Have a Medical Advance Directive?: No, Would patient like information on creating a medical advance directive?: No - Patient declined  Chief Complaint  Patient presents with  . New Admit To SNF    Admission    HPI:  76 yo female seen today as a new admission into SNF following hospital stay for acute encephalopathy, A/CKD stage 4, dehydration, hypercalcemia, ESBL Kleb UTI, CAP, hypokalemia, FTT, right foot/heel pressure injury stage 2, Alzheimer's dementia. She presented to the ED with confusion. Urine cx (+) ESBL Kleb. She completed tx. CXR (+) CAP. Completed 7days IV abx (zosyn-->meropenum-->fosfomycin). Palliative care consulted and after discussion with family, decision made to make her comfort care. Hgb 9.9; albumin 1.9; K 3.1; Cr 0.97; Ca 12 with PTH 120 at d/c. She presents to SNF for comfort care/long term care.  Today she reports dry mouth and dysuria. No f/c. No recent fall. No nursing concerns. Appetite reduced. Sleeps well. She is a poor historian due to dementia. Hx obtained from chart.  Hypotension - stable. BP 95/68  Hx CVA - stable on ASA 81 mg daily   Allergic rhinitis - stable on zyrtec 10 mg daily and flonase daily   Dyslipidemia - stable on zocor 10 mg daily   GERD - stable on protonix 40 mg daily  Overactive bladder - stable on ditropan xl 5 mg daily   Constipation - stable on senna s 2 tabs daily    Alzheimer's disease - stable on aricept 10 mg daily and namenda xr 28 mg daily. Weight stable  Depression - stable on remeron 15 mg nightly;   Failure to thrive - weight is 170 lbs; albumin 1.9; she gets supplements per facility protocol and prostat 30 cc twice daily   CKD - stable. Cr 0.97   Anemia of chronic disease - stable. Hgb 9.9  Hypercalcemia - Ca2+ level 12 at d/c  Hypokalemia - takes k+ 20 meq daily. K 3.1 at d/c   Chronic pain syndrome due to arthritis/joint pain - stable on vicodin 5/325 mg every 8 hours as needed.    Past Medical History:  Diagnosis Date  . Alzheimer disease   . Alzheimer's dementia, late onset, with behavioral disturbance 02/06/2017  . Anemia   . Angina   . Anxiety   . Arthritis   . Blood transfusion   . Breast cancer ([place]) 10/22/2012  . CAP (community acquired pneumonia)   . Chronic kidney disease   . DDD (degenerative disc disease), lumbar   . Dementia   . Dysrhythmia   . GERD without esophagitis 05/28/2017  . Gout   . Headache(784.0)   . Heart murmur   . Hypertension   . Hypothyroidism   . Obesity   . Recurrent upper respiratory infection (URI)   . Shortness of breath 05/26/2013  . Stroke [facility])     Past Surgical History:  Procedure Laterality Date  . UTERINE FIBROID SURGERY      Patient  Care Team: Nolene Ebbs, MD as PCP - General (Internal Medicine)  Social History   Social History  . Marital status: Widowed    Spouse name: N/A  . Number of children: N/A  . Years of education: N/A   Occupational History  . Not on file.   Social History Main Topics  . Smoking status: Former Smoker    Packs/day: 0.25    Types: Cigarettes    Quit date: 03/13/2012  . Smokeless tobacco: Never Used  . Alcohol use No  . Drug use: No  . Sexual activity: No   Other Topics Concern  . Not on file   Social History Narrative  . No narrative on file     reports that she quit smoking about 5 years ago. Her smoking use included  Cigarettes. She smoked 0.25 packs per day. She has never used smokeless tobacco. She reports that she does not drink alcohol or use drugs.  Family History  Problem Relation Age of Onset  . Asthma Mother   . Heart attack Father   . Kidney failure Brother    Family Status  Relation Status  . Mother Deceased  . Father Deceased  . Brother (Not Specified)    Immunization History  Administered Date(s) Administered  . PPD Test 05/24/2017  . Pneumococcal Polysaccharide-23 07/11/2012    Allergies  Allergen Reactions  . Sulfa Antibiotics Itching    Medications: Patient's Medications  New Prescriptions   No medications on file  Previous Medications   ASPIRIN EC 81 MG TABLET    Take 81 mg by mouth daily.   CETIRIZINE (ZYRTEC) 10 MG TABLET    Take 10 mg by mouth daily.    DONEPEZIL (ARICEPT) 10 MG TABLET    Take 10 mg by mouth at bedtime.    FLUTICASONE (FLONASE) 50 MCG/ACT NASAL SPRAY    Place 2 sprays into both nostrils daily.    HYDROCODONE-ACETAMINOPHEN (NORCO/VICODIN) 5-325 MG TABLET    Take 1 tablet by mouth every 8 (eight) hours as needed for moderate pain or severe pain.   MEMANTINE HCL ER (NAMENDA XR) 28 MG CP24    Take 1 capsule by mouth every morning.    MIRTAZAPINE (REMERON) 15 MG TABLET    Take 15 mg by mouth at bedtime.   MULTIPLE VITAMIN (MULTIVITAMIN WITH MINERALS) TABS TABLET    Take 1 tablet by mouth daily.   NUTRITIONAL SUPPLEMENT LIQD    Give 240cc three times a day between meals   OXYBUTYNIN (DITROPAN-XL) 5 MG 24 HR TABLET    Take 5 mg by mouth daily.   PANTOPRAZOLE (PROTONIX) 40 MG TABLET    Take 40 mg by mouth daily.    PHENYLEPHRINE-SHARK LIVER OIL-MINERAL OIL-PETROLATUM (PREPARATION H) 0.25-3-14-71.9 % RECTAL OINTMENT    Place 1 application rectally 2 (two) times daily as needed for hemorrhoids.   SENNA-DOCUSATE (SENOKOT-S) 8.6-50 MG TABLET    Take 2 tablets by mouth at bedtime.   SIMVASTATIN (ZOCOR) 10 MG TABLET    Take 10 mg by mouth at bedtime.  Modified  Medications   No medications on file  Discontinued Medications   No medications on file    Review of Systems  Unable to perform ROS: Dementia    Vitals:   05/29/17 0945  BP: 95/68  Pulse: 77  Resp: 18  Temp: 97.5 F (36.4 C)  TempSrc: Oral  SpO2: 91%  Weight: 170 lb 3.2 oz (77.2 kg)  Height: 5\' 5"  (1.651 m)   Body mass  index is 28.32 kg/m.  Physical Exam  Constitutional: She appears well-developed.  Frail appearing in NAD, lying in bed  HENT:  Mouth/Throat: Oropharynx is clear and moist. No oropharyngeal exudate.  MM dry  Eyes: Pupils are equal, round, and reactive to light. No scleral icterus.  Neck: Neck supple. Carotid bruit is not present. No tracheal deviation present. No thyromegaly present.  Cardiovascular: Regular rhythm and intact distal pulses.  Tachycardia present.  Exam reveals no gallop and no friction rub.   Murmur (2/6 SEM) heard. No LE edema b/l. no calf TTP.   Pulmonary/Chest: Effort normal and breath sounds normal. No stridor. No respiratory distress. She has no wheezes. She has no rales. She exhibits tenderness.  Abdominal: Soft. Bowel sounds are normal. She exhibits no distension and no mass. There is no hepatomegaly. There is no tenderness. There is no rebound and no guarding.  obese  Musculoskeletal: She exhibits edema (small and large joint) and tenderness (right costal angle TTP).  Lymphadenopathy:    She has no cervical adenopathy.  Neurological: She is alert.  Skin: Skin is warm and dry. No rash noted.  Psychiatric: She has a normal mood and affect. Her behavior is normal.     Labs reviewed: Admission on 05/14/2017, Discharged on 05/24/2017  No results displayed because visit has over 200 results.  CBC Latest Ref Rng & Units 05/22/2017 05/21/2017 05/20/2017  WBC 4.0 - 10.5 K/uL 10.0 9.6 8.9  Hemoglobin 12.0 - 15.0 g/dL 9.9(L) 9.8(L) 10.8(L)  Hematocrit 36.0 - 46.0 % 33.5(L) 32.7(L) 36.0  Platelets 150 - 400 K/uL 241 220 238   CMP Latest  Ref Rng & Units 05/22/2017 05/21/2017 05/20/2017  Glucose 65 - 99 mg/dL 85 90 115(H)  BUN 6 - 20 mg/dL 18 20 22(H)  Creatinine 0.44 - 1.00 mg/dL 0.97 0.92 0.88  Sodium 135 - 145 mmol/L 145 141 140  Potassium 3.5 - 5.1 mmol/L 3.1(L) 3.4(L) 3.2(L)  Chloride 101 - 111 mmol/L 111 110 111  CO2 22 - 32 mmol/L 23 22 20(L)  Calcium 8.9 - 10.3 mg/dL 9.8 9.9 10.0  Total Protein 6.5 - 8.1 g/dL - - -  Total Bilirubin 0.3 - 1.2 mg/dL - - -  Alkaline Phos 38 - 126 U/L - - -  AST 15 - 41 U/L - - -  ALT 14 - 54 U/L - - -   Lipid Panel     Component Value Date/Time   CHOL 110 11/13/2011 0553   TRIG 97 11/13/2011 0553   HDL 43 11/13/2011 0553   CHOLHDL 2.6 11/13/2011 0553   VLDL 19 11/13/2011 0553   LDLCALC 48 11/13/2011 0553   No results found for: HGBA1C     Ct Head Wo Contrast  Result Date: 05/14/2017 CLINICAL DATA:  Altered mental status EXAM: CT HEAD WITHOUT CONTRAST TECHNIQUE: Contiguous axial images were obtained from the base of the skull through the vertex without intravenous contrast. COMPARISON:  09/04/2013 FINDINGS: Brain: No evidence of acute infarction, hemorrhage, hydrocephalus, extra-axial collection or mass lesion/mass effect. Mild atrophy. Mild periventricular and deep white matter small vessel ischemic changes. Vascular: No hyperdense vessels.  Carotid artery calcifications. Skull: Normal. Negative for fracture or focal lesion. Sinuses/Orbits: Minimal mucosal thickening in the ethmoid sinuses. No acute orbital abnormality. Bilateral lens extraction. Other: None IMPRESSION: No CT evidence for acute intracranial abnormality. Electronically Signed   By: Donavan Foil M.D.   On: 05/14/2017 16:11   Dg Chest Portable 1 View  Result Date: 05/14/2017 CLINICAL DATA:  Altered mental status EXAM: PORTABLE CHEST 1 VIEW COMPARISON:  02/05/2017 FINDINGS: Low lung volumes with left basilar atelectasis. No focal consolidation or effusion. Stable enlarged cardiomediastinal silhouette with dilatation  and tortuosity of the aortic knob. No pneumothorax. IMPRESSION: Low lung volumes with left basilar atelectasis. Electronically Signed   By: Donavan Foil M.D.   On: 05/14/2017 15:42   Dg Shoulder Left  Result Date: 05/14/2017 CLINICAL DATA:  Left shoulder to formed in with pain EXAM: LEFT SHOULDER - 2+ VIEW COMPARISON:  None. FINDINGS: No fracture or dislocation. Minimal inferior collateral humeral degenerative change and AC joint degenerative change. The left lung apex is clear. IMPRESSION: No acute osseous abnormality. Electronically Signed   By: Donavan Foil M.D.   On: 05/14/2017 18:59   Dg Abd Portable 1v  Result Date: 05/19/2017 CLINICAL DATA:  Constipation EXAM: PORTABLE ABDOMEN - 1 VIEW COMPARISON:  CT abdomen/pelvis dated 09/16/2010 FINDINGS: Nonobstructive bowel gas pattern. Moderate stool in the rectum. Otherwise normal colonic stool burden. Soft tissue calcifications overlying the pelvis. IMPRESSION: Nonobstructive bowel gas pattern. Moderate stool in the rectum. Electronically Signed   By: Julian Hy M.D.   On: 05/19/2017 14:52     Assessment/Plan   ICD-10-CM   1. Failure to thrive in adult R62.7   2. Alzheimer's dementia, late onset, with behavioral disturbance G30.1    F02.81   3. Hypercalcemia E83.52   4. Hypotension, unspecified hypotension type I95.9   5. Hypokalemia E87.6   6. Anemia due to chronic renal failure treated with erythropoietin, stage 4 (severe) (HCC) N18.4    D63.1   7. Overactive bladder N32.81   8. Other chronic pain G89.29     Palliative care referral due to FTT, Alzheimer's dementia, hypercalcemia  Cont current meds as ordered  PT/OT/ST as ordered  Nutritional supplements as ordered  Wound care as indicated  GOAL: comfort care. Prognosis overall poor. Communicated with pt and nursing.  Will follow  Shelli Portilla S. Perlie Gold  [facility] [address] [place], [place]  [address] [phone] Cell (Monday-Friday 8 AM - 5 PM) [phone] After 5 PM and follow prompts

## 2017-05-31 DIAGNOSIS — R531 Weakness: Secondary | ICD-10-CM | POA: Diagnosis not present

## 2017-06-01 DIAGNOSIS — M6281 Muscle weakness (generalized): Secondary | ICD-10-CM | POA: Diagnosis not present

## 2017-06-01 DIAGNOSIS — M130 Polyarthritis, unspecified: Secondary | ICD-10-CM | POA: Diagnosis not present

## 2017-06-04 DIAGNOSIS — M130 Polyarthritis, unspecified: Secondary | ICD-10-CM | POA: Diagnosis not present

## 2017-06-04 DIAGNOSIS — M6281 Muscle weakness (generalized): Secondary | ICD-10-CM | POA: Diagnosis not present

## 2017-06-08 DIAGNOSIS — M130 Polyarthritis, unspecified: Secondary | ICD-10-CM | POA: Diagnosis not present

## 2017-06-08 DIAGNOSIS — M6281 Muscle weakness (generalized): Secondary | ICD-10-CM | POA: Diagnosis not present

## 2017-06-11 DIAGNOSIS — E860 Dehydration: Secondary | ICD-10-CM | POA: Diagnosis not present

## 2017-06-11 DIAGNOSIS — G301 Alzheimer's disease with late onset: Secondary | ICD-10-CM | POA: Diagnosis not present

## 2017-06-11 DIAGNOSIS — R1312 Dysphagia, oropharyngeal phase: Secondary | ICD-10-CM | POA: Diagnosis not present

## 2017-06-11 DIAGNOSIS — G9340 Encephalopathy, unspecified: Secondary | ICD-10-CM | POA: Diagnosis not present

## 2017-06-12 DIAGNOSIS — L89150 Pressure ulcer of sacral region, unstageable: Secondary | ICD-10-CM | POA: Diagnosis not present

## 2017-06-13 DIAGNOSIS — R1312 Dysphagia, oropharyngeal phase: Secondary | ICD-10-CM | POA: Diagnosis not present

## 2017-06-13 DIAGNOSIS — G301 Alzheimer's disease with late onset: Secondary | ICD-10-CM | POA: Diagnosis not present

## 2017-06-13 DIAGNOSIS — G9340 Encephalopathy, unspecified: Secondary | ICD-10-CM | POA: Diagnosis not present

## 2017-06-13 DIAGNOSIS — E860 Dehydration: Secondary | ICD-10-CM | POA: Diagnosis not present

## 2017-06-14 DIAGNOSIS — E860 Dehydration: Secondary | ICD-10-CM | POA: Diagnosis not present

## 2017-06-14 DIAGNOSIS — G9340 Encephalopathy, unspecified: Secondary | ICD-10-CM | POA: Diagnosis not present

## 2017-06-14 DIAGNOSIS — M6281 Muscle weakness (generalized): Secondary | ICD-10-CM | POA: Diagnosis not present

## 2017-06-14 DIAGNOSIS — R1312 Dysphagia, oropharyngeal phase: Secondary | ICD-10-CM | POA: Diagnosis not present

## 2017-06-14 DIAGNOSIS — G301 Alzheimer's disease with late onset: Secondary | ICD-10-CM | POA: Diagnosis not present

## 2017-06-14 DIAGNOSIS — M130 Polyarthritis, unspecified: Secondary | ICD-10-CM | POA: Diagnosis not present

## 2017-06-17 DIAGNOSIS — R1312 Dysphagia, oropharyngeal phase: Secondary | ICD-10-CM | POA: Diagnosis not present

## 2017-06-17 DIAGNOSIS — E860 Dehydration: Secondary | ICD-10-CM | POA: Diagnosis not present

## 2017-06-17 DIAGNOSIS — G301 Alzheimer's disease with late onset: Secondary | ICD-10-CM | POA: Diagnosis not present

## 2017-06-17 DIAGNOSIS — G9340 Encephalopathy, unspecified: Secondary | ICD-10-CM | POA: Diagnosis not present

## 2017-06-18 DIAGNOSIS — M109 Gout, unspecified: Secondary | ICD-10-CM | POA: Diagnosis not present

## 2017-06-19 DIAGNOSIS — L89150 Pressure ulcer of sacral region, unstageable: Secondary | ICD-10-CM | POA: Diagnosis not present

## 2017-06-21 DIAGNOSIS — R1013 Epigastric pain: Secondary | ICD-10-CM | POA: Diagnosis not present

## 2017-06-21 DIAGNOSIS — R1312 Dysphagia, oropharyngeal phase: Secondary | ICD-10-CM | POA: Diagnosis not present

## 2017-06-21 DIAGNOSIS — G301 Alzheimer's disease with late onset: Secondary | ICD-10-CM | POA: Diagnosis not present

## 2017-06-21 DIAGNOSIS — N184 Chronic kidney disease, stage 4 (severe): Secondary | ICD-10-CM | POA: Diagnosis not present

## 2017-06-21 DIAGNOSIS — E860 Dehydration: Secondary | ICD-10-CM | POA: Diagnosis not present

## 2017-06-21 DIAGNOSIS — G9340 Encephalopathy, unspecified: Secondary | ICD-10-CM | POA: Diagnosis not present

## 2017-06-21 DIAGNOSIS — I10 Essential (primary) hypertension: Secondary | ICD-10-CM | POA: Diagnosis not present

## 2017-06-22 DIAGNOSIS — G301 Alzheimer's disease with late onset: Secondary | ICD-10-CM | POA: Diagnosis not present

## 2017-06-22 DIAGNOSIS — R1312 Dysphagia, oropharyngeal phase: Secondary | ICD-10-CM | POA: Diagnosis not present

## 2017-06-22 DIAGNOSIS — G9340 Encephalopathy, unspecified: Secondary | ICD-10-CM | POA: Diagnosis not present

## 2017-06-22 DIAGNOSIS — E860 Dehydration: Secondary | ICD-10-CM | POA: Diagnosis not present

## 2017-07-31 ENCOUNTER — Emergency Department (HOSPITAL_COMMUNITY): Payer: Medicare Other

## 2017-07-31 ENCOUNTER — Inpatient Hospital Stay (HOSPITAL_COMMUNITY)
Admission: EM | Admit: 2017-07-31 | Discharge: 2017-08-25 | DRG: 871 | Disposition: E | Payer: Medicare Other | Attending: Pulmonary Disease | Admitting: Pulmonary Disease

## 2017-07-31 ENCOUNTER — Inpatient Hospital Stay (HOSPITAL_COMMUNITY): Payer: Medicare Other

## 2017-07-31 DIAGNOSIS — D638 Anemia in other chronic diseases classified elsewhere: Secondary | ICD-10-CM | POA: Diagnosis present

## 2017-07-31 DIAGNOSIS — Z515 Encounter for palliative care: Secondary | ICD-10-CM | POA: Diagnosis not present

## 2017-07-31 DIAGNOSIS — N184 Chronic kidney disease, stage 4 (severe): Secondary | ICD-10-CM | POA: Diagnosis present

## 2017-07-31 DIAGNOSIS — R6520 Severe sepsis without septic shock: Secondary | ICD-10-CM | POA: Diagnosis not present

## 2017-07-31 DIAGNOSIS — K922 Gastrointestinal hemorrhage, unspecified: Secondary | ICD-10-CM | POA: Diagnosis present

## 2017-07-31 DIAGNOSIS — Z8673 Personal history of transient ischemic attack (TIA), and cerebral infarction without residual deficits: Secondary | ICD-10-CM

## 2017-07-31 DIAGNOSIS — E876 Hypokalemia: Secondary | ICD-10-CM | POA: Diagnosis present

## 2017-07-31 DIAGNOSIS — R011 Cardiac murmur, unspecified: Secondary | ICD-10-CM | POA: Diagnosis present

## 2017-07-31 DIAGNOSIS — K449 Diaphragmatic hernia without obstruction or gangrene: Secondary | ICD-10-CM | POA: Diagnosis present

## 2017-07-31 DIAGNOSIS — Z8619 Personal history of other infectious and parasitic diseases: Secondary | ICD-10-CM

## 2017-07-31 DIAGNOSIS — Z841 Family history of disorders of kidney and ureter: Secondary | ICD-10-CM

## 2017-07-31 DIAGNOSIS — Z87891 Personal history of nicotine dependence: Secondary | ICD-10-CM

## 2017-07-31 DIAGNOSIS — L89153 Pressure ulcer of sacral region, stage 3: Secondary | ICD-10-CM | POA: Diagnosis present

## 2017-07-31 DIAGNOSIS — A4102 Sepsis due to Methicillin resistant Staphylococcus aureus: Principal | ICD-10-CM | POA: Diagnosis present

## 2017-07-31 DIAGNOSIS — K760 Fatty (change of) liver, not elsewhere classified: Secondary | ICD-10-CM | POA: Diagnosis present

## 2017-07-31 DIAGNOSIS — D689 Coagulation defect, unspecified: Secondary | ICD-10-CM | POA: Diagnosis present

## 2017-07-31 DIAGNOSIS — N171 Acute kidney failure with acute cortical necrosis: Secondary | ICD-10-CM | POA: Diagnosis not present

## 2017-07-31 DIAGNOSIS — B451 Cerebral cryptococcosis: Secondary | ICD-10-CM | POA: Diagnosis not present

## 2017-07-31 DIAGNOSIS — I059 Rheumatic mitral valve disease, unspecified: Secondary | ICD-10-CM | POA: Diagnosis present

## 2017-07-31 DIAGNOSIS — E669 Obesity, unspecified: Secondary | ICD-10-CM | POA: Diagnosis present

## 2017-07-31 DIAGNOSIS — E162 Hypoglycemia, unspecified: Secondary | ICD-10-CM | POA: Diagnosis present

## 2017-07-31 DIAGNOSIS — R627 Adult failure to thrive: Secondary | ICD-10-CM | POA: Diagnosis not present

## 2017-07-31 DIAGNOSIS — E872 Acidosis: Secondary | ICD-10-CM | POA: Diagnosis present

## 2017-07-31 DIAGNOSIS — E039 Hypothyroidism, unspecified: Secondary | ICD-10-CM | POA: Diagnosis present

## 2017-07-31 DIAGNOSIS — J9601 Acute respiratory failure with hypoxia: Secondary | ICD-10-CM | POA: Diagnosis present

## 2017-07-31 DIAGNOSIS — N390 Urinary tract infection, site not specified: Secondary | ICD-10-CM | POA: Diagnosis present

## 2017-07-31 DIAGNOSIS — N179 Acute kidney failure, unspecified: Secondary | ICD-10-CM | POA: Diagnosis not present

## 2017-07-31 DIAGNOSIS — Z7189 Other specified counseling: Secondary | ICD-10-CM

## 2017-07-31 DIAGNOSIS — Z66 Do not resuscitate: Secondary | ICD-10-CM | POA: Diagnosis not present

## 2017-07-31 DIAGNOSIS — Z79891 Long term (current) use of opiate analgesic: Secondary | ICD-10-CM

## 2017-07-31 DIAGNOSIS — A4101 Sepsis due to Methicillin susceptible Staphylococcus aureus: Secondary | ICD-10-CM | POA: Diagnosis not present

## 2017-07-31 DIAGNOSIS — K5641 Fecal impaction: Secondary | ICD-10-CM | POA: Diagnosis not present

## 2017-07-31 DIAGNOSIS — Z6841 Body Mass Index (BMI) 40.0 and over, adult: Secondary | ICD-10-CM

## 2017-07-31 DIAGNOSIS — Z882 Allergy status to sulfonamides status: Secondary | ICD-10-CM

## 2017-07-31 DIAGNOSIS — Z8249 Family history of ischemic heart disease and other diseases of the circulatory system: Secondary | ICD-10-CM

## 2017-07-31 DIAGNOSIS — Z7401 Bed confinement status: Secondary | ICD-10-CM

## 2017-07-31 DIAGNOSIS — R4182 Altered mental status, unspecified: Secondary | ICD-10-CM | POA: Diagnosis present

## 2017-07-31 DIAGNOSIS — Z8614 Personal history of Methicillin resistant Staphylococcus aureus infection: Secondary | ICD-10-CM

## 2017-07-31 DIAGNOSIS — F0150 Vascular dementia without behavioral disturbance: Secondary | ICD-10-CM | POA: Diagnosis not present

## 2017-07-31 DIAGNOSIS — G301 Alzheimer's disease with late onset: Secondary | ICD-10-CM | POA: Diagnosis not present

## 2017-07-31 DIAGNOSIS — R740 Nonspecific elevation of levels of transaminase and lactic acid dehydrogenase [LDH]: Secondary | ICD-10-CM | POA: Diagnosis present

## 2017-07-31 DIAGNOSIS — A419 Sepsis, unspecified organism: Secondary | ICD-10-CM | POA: Diagnosis not present

## 2017-07-31 DIAGNOSIS — J9811 Atelectasis: Secondary | ICD-10-CM | POA: Diagnosis present

## 2017-07-31 DIAGNOSIS — G9341 Metabolic encephalopathy: Secondary | ICD-10-CM | POA: Diagnosis present

## 2017-07-31 DIAGNOSIS — R7881 Bacteremia: Secondary | ICD-10-CM | POA: Diagnosis not present

## 2017-07-31 DIAGNOSIS — N170 Acute kidney failure with tubular necrosis: Secondary | ICD-10-CM | POA: Diagnosis not present

## 2017-07-31 DIAGNOSIS — Z90710 Acquired absence of both cervix and uterus: Secondary | ICD-10-CM

## 2017-07-31 DIAGNOSIS — Z87440 Personal history of urinary (tract) infections: Secondary | ICD-10-CM

## 2017-07-31 DIAGNOSIS — R571 Hypovolemic shock: Secondary | ICD-10-CM | POA: Diagnosis present

## 2017-07-31 DIAGNOSIS — Z7982 Long term (current) use of aspirin: Secondary | ICD-10-CM

## 2017-07-31 DIAGNOSIS — K649 Unspecified hemorrhoids: Secondary | ICD-10-CM | POA: Diagnosis present

## 2017-07-31 DIAGNOSIS — R6521 Severe sepsis with septic shock: Secondary | ICD-10-CM | POA: Diagnosis not present

## 2017-07-31 DIAGNOSIS — I129 Hypertensive chronic kidney disease with stage 1 through stage 4 chronic kidney disease, or unspecified chronic kidney disease: Secondary | ICD-10-CM | POA: Diagnosis present

## 2017-07-31 DIAGNOSIS — Z825 Family history of asthma and other chronic lower respiratory diseases: Secondary | ICD-10-CM

## 2017-07-31 DIAGNOSIS — M1990 Unspecified osteoarthritis, unspecified site: Secondary | ICD-10-CM | POA: Diagnosis present

## 2017-07-31 DIAGNOSIS — E871 Hypo-osmolality and hyponatremia: Secondary | ICD-10-CM | POA: Diagnosis not present

## 2017-07-31 DIAGNOSIS — K625 Hemorrhage of anus and rectum: Secondary | ICD-10-CM | POA: Diagnosis not present

## 2017-07-31 DIAGNOSIS — L89143 Pressure ulcer of left lower back, stage 3: Secondary | ICD-10-CM | POA: Diagnosis not present

## 2017-07-31 DIAGNOSIS — Z4659 Encounter for fitting and adjustment of other gastrointestinal appliance and device: Secondary | ICD-10-CM

## 2017-07-31 DIAGNOSIS — Z79899 Other long term (current) drug therapy: Secondary | ICD-10-CM

## 2017-07-31 DIAGNOSIS — Z853 Personal history of malignant neoplasm of breast: Secondary | ICD-10-CM

## 2017-07-31 DIAGNOSIS — D62 Acute posthemorrhagic anemia: Secondary | ICD-10-CM | POA: Diagnosis not present

## 2017-07-31 DIAGNOSIS — I38 Endocarditis, valve unspecified: Secondary | ICD-10-CM | POA: Diagnosis not present

## 2017-07-31 DIAGNOSIS — I361 Nonrheumatic tricuspid (valve) insufficiency: Secondary | ICD-10-CM | POA: Diagnosis not present

## 2017-07-31 DIAGNOSIS — E878 Other disorders of electrolyte and fluid balance, not elsewhere classified: Secondary | ICD-10-CM | POA: Diagnosis present

## 2017-07-31 DIAGNOSIS — F0281 Dementia in other diseases classified elsewhere with behavioral disturbance: Secondary | ICD-10-CM | POA: Diagnosis not present

## 2017-07-31 DIAGNOSIS — K219 Gastro-esophageal reflux disease without esophagitis: Secondary | ICD-10-CM | POA: Diagnosis present

## 2017-07-31 LAB — CBC WITH DIFFERENTIAL/PLATELET
BASOS ABS: 0 10*3/uL (ref 0.0–0.1)
Basophils Relative: 0 %
EOS ABS: 0 10*3/uL (ref 0.0–0.7)
Eosinophils Relative: 0 %
HCT: 24 % — ABNORMAL LOW (ref 36.0–46.0)
Hemoglobin: 7.7 g/dL — ABNORMAL LOW (ref 12.0–15.0)
Lymphocytes Relative: 8 %
Lymphs Abs: 1.5 10*3/uL (ref 0.7–4.0)
MCH: 28.5 pg (ref 26.0–34.0)
MCHC: 32.1 g/dL (ref 30.0–36.0)
MCV: 88.9 fL (ref 78.0–100.0)
MONO ABS: 0.6 10*3/uL (ref 0.1–1.0)
Monocytes Relative: 3 %
NEUTROS PCT: 89 %
Neutro Abs: 16.6 10*3/uL — ABNORMAL HIGH (ref 1.7–7.7)
PLATELETS: 302 10*3/uL (ref 150–400)
RBC: 2.7 MIL/uL — AB (ref 3.87–5.11)
RDW: 21.1 % — ABNORMAL HIGH (ref 11.5–15.5)
WBC: 18.7 10*3/uL — AB (ref 4.0–10.5)

## 2017-07-31 LAB — I-STAT CHEM 8, ED
BUN: 81 mg/dL — ABNORMAL HIGH (ref 6–20)
CHLORIDE: 114 mmol/L — AB (ref 101–111)
Calcium, Ion: 1.28 mmol/L (ref 1.15–1.40)
Creatinine, Ser: 4.8 mg/dL — ABNORMAL HIGH (ref 0.44–1.00)
Glucose, Bld: 195 mg/dL — ABNORMAL HIGH (ref 65–99)
HEMATOCRIT: 26 % — AB (ref 36.0–46.0)
Hemoglobin: 8.8 g/dL — ABNORMAL LOW (ref 12.0–15.0)
POTASSIUM: 4.8 mmol/L (ref 3.5–5.1)
SODIUM: 138 mmol/L (ref 135–145)
TCO2: 14 mmol/L (ref 0–100)

## 2017-07-31 LAB — COMPREHENSIVE METABOLIC PANEL
ALK PHOS: 165 U/L — AB (ref 38–126)
ALT: 60 U/L — AB (ref 14–54)
AST: 52 U/L — ABNORMAL HIGH (ref 15–41)
Albumin: 1.4 g/dL — ABNORMAL LOW (ref 3.5–5.0)
Anion gap: 12 (ref 5–15)
BUN: 74 mg/dL — ABNORMAL HIGH (ref 6–20)
CALCIUM: 9.2 mg/dL (ref 8.9–10.3)
CO2: 12 mmol/L — ABNORMAL LOW (ref 22–32)
CREATININE: 4.83 mg/dL — AB (ref 0.44–1.00)
Chloride: 112 mmol/L — ABNORMAL HIGH (ref 101–111)
GFR, EST AFRICAN AMERICAN: 9 mL/min — AB (ref >60)
GFR, EST NON AFRICAN AMERICAN: 8 mL/min — AB (ref >60)
Glucose, Bld: 202 mg/dL — ABNORMAL HIGH (ref 65–99)
Potassium: 5.7 mmol/L — ABNORMAL HIGH (ref 3.5–5.1)
Sodium: 136 mmol/L (ref 135–145)
Total Bilirubin: 1.8 mg/dL — ABNORMAL HIGH (ref 0.3–1.2)
Total Protein: 4.2 g/dL — ABNORMAL LOW (ref 6.5–8.1)

## 2017-07-31 LAB — URINALYSIS, MICROSCOPIC (REFLEX)

## 2017-07-31 LAB — URINALYSIS, ROUTINE W REFLEX MICROSCOPIC
GLUCOSE, UA: NEGATIVE mg/dL
KETONES UR: 15 mg/dL — AB
NITRITE: NEGATIVE
PROTEIN: 100 mg/dL — AB
Specific Gravity, Urine: 1.025 (ref 1.005–1.030)
pH: 5.5 (ref 5.0–8.0)

## 2017-07-31 LAB — CBG MONITORING, ED
Glucose-Capillary: 49 mg/dL — ABNORMAL LOW (ref 65–99)
Glucose-Capillary: 51 mg/dL — ABNORMAL LOW (ref 65–99)
Glucose-Capillary: 134 mg/dL — ABNORMAL HIGH (ref 65–99)

## 2017-07-31 LAB — LACTIC ACID, PLASMA: LACTIC ACID, VENOUS: 3.5 mmol/L — AB (ref 0.5–1.9)

## 2017-07-31 LAB — I-STAT ARTERIAL BLOOD GAS, ED
ACID-BASE DEFICIT: 13 mmol/L — AB (ref 0.0–2.0)
Bicarbonate: 11.9 mmol/L — ABNORMAL LOW (ref 20.0–28.0)
O2 Saturation: 100 %
PCO2 ART: 24.4 mmHg — AB (ref 32.0–48.0)
PH ART: 7.293 — AB (ref 7.350–7.450)
PO2 ART: 268 mmHg — AB (ref 83.0–108.0)
Patient temperature: 97.9
TCO2: 13 mmol/L (ref 0–100)

## 2017-07-31 LAB — I-STAT CG4 LACTIC ACID, ED: LACTIC ACID, VENOUS: 2.1 mmol/L — AB (ref 0.5–1.9)

## 2017-07-31 MED ORDER — SODIUM CHLORIDE 0.9 % IV SOLN
250.0000 mL | INTRAVENOUS | Status: DC | PRN
  Administered 2017-08-02 – 2017-08-04 (×2): 250 mL via INTRAVENOUS

## 2017-07-31 MED ORDER — SODIUM CHLORIDE 0.9 % IV BOLUS (SEPSIS)
1000.0000 mL | Freq: Once | INTRAVENOUS | Status: AC
  Administered 2017-07-31: 1000 mL via INTRAVENOUS

## 2017-07-31 MED ORDER — NOREPINEPHRINE BITARTRATE 1 MG/ML IV SOLN
0.0000 ug/min | Freq: Once | INTRAVENOUS | Status: AC
  Administered 2017-07-31: 2 ug/min via INTRAVENOUS
  Filled 2017-07-31: qty 4

## 2017-07-31 MED ORDER — LIDOCAINE HCL (PF) 2 % IJ SOLN
0.2500 mg/kg | Freq: Once | INTRAMUSCULAR | Status: AC
  Administered 2017-07-31: 20 mg
  Filled 2017-07-31: qty 2

## 2017-07-31 MED ORDER — NOREPINEPHRINE BITARTRATE 1 MG/ML IV SOLN
0.0000 ug/min | INTRAVENOUS | Status: DC
  Administered 2017-07-31: 2 ug/min via INTRAVENOUS
  Administered 2017-08-01: 36 ug/min via INTRAVENOUS
  Administered 2017-08-01: 32 ug/min via INTRAVENOUS
  Administered 2017-08-01: 40 ug/min via INTRAVENOUS
  Administered 2017-08-02: 30 ug/min via INTRAVENOUS
  Administered 2017-08-02: 28 ug/min via INTRAVENOUS
  Administered 2017-08-03: 16 ug/min via INTRAVENOUS
  Administered 2017-08-04: 20 ug/min via INTRAVENOUS
  Administered 2017-08-04: 50 ug/min via INTRAVENOUS
  Administered 2017-08-04: 30 ug/min via INTRAVENOUS
  Administered 2017-08-05: 40 ug/min via INTRAVENOUS
  Administered 2017-08-05: 29.973 ug/min via INTRAVENOUS
  Filled 2017-07-31 (×14): qty 16

## 2017-07-31 MED ORDER — GLUCAGON HCL RDNA (DIAGNOSTIC) 1 MG IJ SOLR
1.0000 mg | Freq: Once | INTRAMUSCULAR | Status: AC
  Administered 2017-07-31: 1 mg via INTRAVENOUS
  Filled 2017-07-31: qty 1

## 2017-07-31 MED ORDER — DEXTROSE 50 % IV SOLN
1.0000 | Freq: Once | INTRAVENOUS | Status: AC
  Administered 2017-07-31: 50 mL via INTRAVENOUS
  Filled 2017-07-31: qty 50

## 2017-07-31 MED ORDER — FENTANYL CITRATE (PF) 100 MCG/2ML IJ SOLN
25.0000 ug | Freq: Once | INTRAMUSCULAR | Status: AC
  Administered 2017-07-31: 25 ug via INTRAVENOUS
  Filled 2017-07-31: qty 2

## 2017-07-31 MED ORDER — VASOPRESSIN 20 UNIT/ML IV SOLN
0.0400 [IU]/min | INTRAVENOUS | Status: DC
  Administered 2017-07-31: 0.03 [IU]/min via INTRAVENOUS
  Administered 2017-08-01 – 2017-08-05 (×6): 0.04 [IU]/min via INTRAVENOUS
  Filled 2017-07-31 (×7): qty 2

## 2017-07-31 MED ORDER — HEPARIN SODIUM (PORCINE) 5000 UNIT/ML IJ SOLN
5000.0000 [IU] | Freq: Three times a day (TID) | INTRAMUSCULAR | Status: DC
  Administered 2017-07-31: 5000 [IU] via SUBCUTANEOUS
  Filled 2017-07-31: qty 1

## 2017-07-31 MED ORDER — SODIUM CHLORIDE 0.9 % IV SOLN
1.0000 g | Freq: Every day | INTRAVENOUS | Status: DC
  Filled 2017-07-31: qty 1

## 2017-07-31 MED ORDER — STERILE WATER FOR INJECTION IV SOLN
INTRAVENOUS | Status: DC
  Administered 2017-08-01 – 2017-08-02 (×3): via INTRAVENOUS
  Filled 2017-07-31 (×8): qty 850

## 2017-07-31 MED ORDER — VANCOMYCIN HCL 10 G IV SOLR
1250.0000 mg | Freq: Once | INTRAVENOUS | Status: AC
  Administered 2017-08-01: 1250 mg via INTRAVENOUS
  Filled 2017-07-31: qty 1250

## 2017-07-31 MED ORDER — INSULIN ASPART 100 UNIT/ML ~~LOC~~ SOLN
2.0000 [IU] | SUBCUTANEOUS | Status: DC
Start: 2017-08-01 — End: 2017-07-31

## 2017-07-31 MED ORDER — SODIUM CHLORIDE 0.9 % IV SOLN: INTRAVENOUS | Status: DC | PRN

## 2017-07-31 MED ORDER — PIPERACILLIN-TAZOBACTAM 3.375 G IVPB 30 MIN
3.3750 g | Freq: Once | INTRAVENOUS | Status: AC
  Administered 2017-07-31: 3.375 g via INTRAVENOUS
  Filled 2017-07-31: qty 50

## 2017-07-31 NOTE — ED Notes (Signed)
Pt had bowel movement. Bedding changed and pt repositioned.

## 2017-07-31 NOTE — ED Notes (Signed)
ICU MD notified of critical lactic acid.

## 2017-07-31 NOTE — ED Notes (Signed)
NP at bedside. Vasopressin and Levophed going. Central line placement in process.

## 2017-07-31 NOTE — H&P (Signed)
PULMONARY / CRITICAL CARE MEDICINE   Name: Katherine Cooper MRN: [id number] DOB: 06/16/1941    ADMISSION DATE:  07/25/2017 CONSULTATION DATE:  08/24/2017  REFERRING MD:  Dr. Ralene Bathe   CHIEF COMPLAINT:  Sepsis   HISTORY OF PRESENT ILLNESS:  Information obtained from medical record and family as patient is encephalopathic  76 year old female with PMH of Alzheimer dementia, Anemia, Breast CA, CKD, GERD, HTN, Hypothyroid, CVA   Presents to ED on 8/7 with progressive lethargy. Family reports that patient has been at [facility] since February 2018 and since has been bed bound however, alert and verbal and confused. Patient has stage 3 sacral pressure ulcer (which looks clean). For the last few days reportedly has been non-verbal and moaning. Upon arrival patient was hypoglycemic (glucose 49) and hypotensive (84/69). WBC 18.7, LA 2.10, administered 3L NS and started on Levophed gtt. CT head negative for acute process. PCCM asked to admit.   PAST MEDICAL HISTORY :  She  has a past medical history of Alzheimer disease; Alzheimer's dementia, late onset, with behavioral disturbance (02/06/2017); Anemia; Angina; Anxiety; Arthritis; Blood transfusion; Breast cancer ([place]) (10/22/2012); CAP (community acquired pneumonia); Chronic kidney disease; DDD (degenerative disc disease), lumbar; Dementia; Dysrhythmia; GERD without esophagitis (05/28/2017); Gout; Headache(784.0); Heart murmur; Hypertension; Hypothyroidism; Obesity; Recurrent upper respiratory infection (URI); Shortness of breath (05/26/2013); and Stroke [facility]).  PAST SURGICAL HISTORY: She  has a past surgical history that includes Uterine fibroid surgery.  Allergies  Allergen Reactions  . Sulfa Antibiotics Itching    No current facility-administered medications on file prior to encounter.    Current Outpatient Prescriptions on File Prior to Encounter  Medication Sig  . aspirin EC 81 MG tablet Take 81 mg by mouth daily.  . cetirizine (ZYRTEC) 10 MG tablet Take  10 mg by mouth daily.   Marland Kitchen donepezil (ARICEPT) 10 MG tablet Take 10 mg by mouth at bedtime.   . fluticasone (FLONASE) 50 MCG/ACT nasal spray Place 2 sprays into both nostrils daily.   Marland Kitchen HYDROcodone-acetaminophen (NORCO/VICODIN) 5-325 MG tablet Take 1 tablet by mouth every 8 (eight) hours as needed for moderate pain or severe pain.  . Memantine HCl ER (NAMENDA XR) 28 MG CP24 Take 1 capsule by mouth every morning.   . mirtazapine (REMERON) 15 MG tablet Take 15 mg by mouth at bedtime.  . Multiple Vitamin (MULTIVITAMIN WITH MINERALS) TABS tablet Take 1 tablet by mouth daily.  Marland Kitchen NUTRITIONAL SUPPLEMENT LIQD Give 240cc three times a day between meals  . oxybutynin (DITROPAN-XL) 5 MG 24 hr tablet Take 5 mg by mouth daily.  . pantoprazole (PROTONIX) 40 MG tablet Take 40 mg by mouth daily.   . phenylephrine-shark liver oil-mineral oil-petrolatum (PREPARATION H) 0.25-3-14-71.9 % rectal ointment Place 1 application rectally 2 (two) times daily as needed for hemorrhoids.  Marland Kitchen senna-docusate (SENOKOT-S) 8.6-50 MG tablet Take 2 tablets by mouth at bedtime.  . simvastatin (ZOCOR) 10 MG tablet Take 10 mg by mouth at bedtime.    FAMILY HISTORY:  Her indicated that her mother is deceased. She indicated that her father is deceased. She indicated that the status of her brother is unknown.    SOCIAL HISTORY: She  reports that she quit smoking about 5 years ago. Her smoking use included Cigarettes. She smoked 0.25 packs per day. She has never used smokeless tobacco. She reports that she does not drink alcohol or use drugs.  REVIEW OF SYSTEMS:   Unable to review as patient is encephalopathic   SUBJECTIVE:  VITAL SIGNS: BP (!) 82/69   Pulse (!) 105   Temp 97.9 F (36.6 C) (Rectal)   Resp 15   Wt 77.2 kg (170 lb 3.1 oz)   SpO2 100%   BMI 28.32 kg/m   HEMODYNAMICS:    VENTILATOR SETTINGS:    INTAKE / OUTPUT: I/O last 3 completed shifts: In: 34 [IV Piggyback:50] Out: -   PHYSICAL  EXAMINATION: General:  Chronically ill adult female Neuro:  Lethargic, moaning, confused, not following commands  HEENT:  Dry MM Cardiovascular:  Tachy, no MRG Lungs:  Clear breath sounds, non-labored  Abdomen: obese, active bowel sounds  Musculoskeletal:  -edema  Skin:  Dry, stage 3 sacral ulcer   LABS:  BMET  Recent Labs Lab 08/18/2017 1721 08/09/2017 1730  NA 136 138  K 5.7* 4.8  CL 112* 114*  CO2 12*  --   BUN 74* 81*  CREATININE 4.83* 4.80*  GLUCOSE 202* 195*    Electrolytes  Recent Labs Lab 08/10/2017 1721  CALCIUM 9.2    CBC  Recent Labs Lab 08/05/2017 1721 07/28/2017 1730  WBC 18.7*  --   HGB 7.7* 8.8*  HCT 24.0* 26.0*  PLT 302  --     Coag's No results for input(s): APTT, INR in the last 168 hours.  Sepsis Markers  Recent Labs Lab 07/30/2017 1731  LATICACIDVEN 2.10*    ABG  Recent Labs Lab 08/03/2017 2102  PHART 7.293*  PCO2ART 24.4*  PO2ART 268.0*    Liver Enzymes  Recent Labs Lab 07/29/2017 1721  AST 52*  ALT 60*  ALKPHOS 165*  BILITOT 1.8*  ALBUMIN 1.4*    Cardiac Enzymes No results for input(s): TROPONINI, PROBNP in the last 168 hours.  Glucose  Recent Labs Lab 07/27/2017 1535 07/26/2017 1703 07/30/2017 1732  GLUCAP 49* 51* 134*    Imaging Ct Head Wo Contrast  Result Date: 08/14/2017 CLINICAL DATA:  Acute mental status changes. EXAM: CT HEAD WITHOUT CONTRAST TECHNIQUE: Contiguous axial images were obtained from the base of the skull through the vertex without intravenous contrast. COMPARISON:  05/14/2017 FINDINGS: Brain: Stable atrophy and mild underlying periventricular small vessel disease. The brain demonstrates no evidence of hemorrhage, acute infarction, edema, mass effect, extra-axial fluid collection, hydrocephalus or mass lesion. Vascular: No hyperdense vessel or unexpected calcification. Skull: Normal. Negative for fracture or focal lesion. Sinuses/Orbits: No acute finding. Other: None. IMPRESSION: No acute findings.   Stable mild atrophy and small vessel disease. Electronically Signed   By: Aletta Edouard M.D.   On: 08/16/2017 19:55   Dg Chest Port 1 View  Result Date: 08/04/2017 CLINICAL DATA:  Mental status changes. EXAM: PORTABLE CHEST 1 VIEW COMPARISON:  05/14/2017 FINDINGS: The heart size is within normal limits. There is stable prominence and tortuosity of the thoracic aorta. Lung volumes are low bilaterally. There is no evidence of pulmonary edema, consolidation, pneumothorax, nodule or pleural fluid. IMPRESSION: No active disease. Electronically Signed   By: Aletta Edouard M.D.   On: 08/05/2017 16:04   Ct Renal Stone Study  Result Date: 07/25/2017 CLINICAL DATA:  Patient with abdominal pain. EXAM: CT ABDOMEN AND PELVIS WITHOUT CONTRAST TECHNIQUE: Multidetector CT imaging of the abdomen and pelvis was performed following the standard protocol without IV contrast. COMPARISON:  None. FINDINGS: Lower chest: Normal heart size. Minimal atelectasis within the lung bases. Moderate size hiatal hernia. Hepatobiliary: Liver is diffusely low in attenuation compatible with steatosis. High attenuation within the gallbladder lumen most compatible with sludge/stones. No surrounding inflammatory change. No intrahepatic  or extrahepatic biliary ductal dilatation. Pancreas: Unremarkable.  No surrounding inflammatory change. Spleen: Unremarkable Adrenals/Urinary Tract: The adrenal glands are normal in appearance. Kidneys are symmetric in size. No hydronephrosis. Urinary bladder is decompressed with a Foley catheter. Stomach/Bowel: Large amount of stool within the rectum. Normal appendix. No evidence for small bowel obstruction. No free fluid or free intraperitoneal air. Vascular/Lymphatic: Normal caliber abdominal aorta. Peripheral calcified atherosclerotic plaque. No retroperitoneal lymphadenopathy. Reproductive: Status post hysterectomy. Other: None. Musculoskeletal: No aggressive or acute appearing osseous lesions. Lumbar spine  degenerative changes. Similar-appearing sclerotic lesions within the left ilium suggestive of bone islands. IMPRESSION: Large amount of stool within the rectum as can be seen with impaction. Moderate hiatal hernia. Hepatic steatosis. High attenuation within the gallbladder lumen most suggestive of stones/sludge. Electronically Signed   By: Lovey Newcomer M.D.   On: 07/28/2017 20:16     STUDIES:  CXR 8/7 > No acute  CT Head 8/7 > No acute, mild stable atrophy and small vessel disease  CT Renal 8/7 > Large amount of stool in rectum, hepatic steatosis, high attenuation within the gallbladder, most suggestive of stone/sluge    CULTURES: Blood 8/7 >> Urine 8/7 >>  ANTIBIOTICS: Meropenem 8/7 >> Zosyn 8/7 > 8/7  Vancomycin 8/7 >>  SIGNIFICANT EVENTS: 8/7 > Presents to ED   LINES/TUBES: IO 8/7 >>  DISCUSSION: 76 year old female with extensive health history presents to ED with progressive lethargy.   ASSESSMENT / PLAN:  PULMONARY A: Respiratory Insufficiency in setting of sepsis  P:   Maintain Oxygen >92 Pulmonary Hygiene  Trend ABG   CARDIOVASCULAR A:  Hypotension in setting of Septic Shock  H/O HTN  P:  Cardiac Monitoring  Maintain MAP >65 Currently on Levophed, add vasopressin  Will place CVC   RENAL A:   Anion Gap Metabolic Acidosis  Lactic Acidosis  Acute on CKD stage 4  (Base Creatinine  0.97)  P:   Trend BMP Replace electrolytes as indicated  Bicarb gtt @ 75 ml/hr  Trend Lactic Acid  Renal U/S pending   GASTROINTESTINAL A:   Failure to Thrive  GERD  P:   NPO PPI  HEMATOLOGIC A:   Anemia of Chronic disease  P:  Trend CBC   INFECTIOUS A:   Septic shock due to UTI  Stage 3 pressure sacral ulcer  H/O ESBL Klebsiella UTI (04/2017) and MRSA bacteremia (04/2017) CT Renal > with ?stones/sludge in gallbladder  P:   Trend WBC and fever curve Trend PCT Follow culture data Meropenem and Vancomycin given ESBL/MRSA history   ENDOCRINE A:    Hypoglycemia    P:   Trend Glucose  PRN dextrose  Cortisol pending   NEUROLOGIC A:   Metabolic Encephalopathy  H/O Alzheimer's Dementia  P:   Monitor   FAMILY  - Updates: Prolonged conversation with family regarding goals of care. Patient previously listed as DNR. Family (sons and daughters) understand poor baseline and chronic co-morbidites, however want to proceed with Full code.    - Inter-disciplinary family meet or Palliative Care meeting due by: 08/07/2017  CC Time: 70 minutes  Hayden Pedro, AGACNP-BC [facility]  Pgr: [phone]  PCCM Pgr: [phone]    Attending Addendum: I personally examined this patient and agree with plan as detailed above. Briefly, this is a 21yoF with Dementia, CVA, Obesity, GERD, CKD, Breast cancer, OA, Anemia, who had MRSA bacteremia and ESBL Klebsiella UTI both in 04/2017, how presents to the ER from her nursing home with  lethargy, and hypoglycemia (blood glucose 40's) and septic shock due to a UTI. She had previously been DNR, but her adult children tonight in there ER changed her to FULL CODE. She received 4L IVF in the ER and was started on Vanc and Meropenem. ABG showed metabolic acidosis and she was started on a bicarb gtt. Trend lactate and procalcitonin. Gave D50 for her hypoglycemia; check accuchecks q4hrs. Has AKI. Placed foley and ordered Renal ultrasound. Developed LGIB after arrival to the ICU. Hgb 7.6 down from 8.8. Platelets ok. Will check INR and Fibrinogen. Will check another Hgb in 4 hours.   60 minutes critical care time  Vernie Murders, MD Pulmonary & Critical Care

## 2017-07-31 NOTE — ED Notes (Signed)
Phlebotomy called, multiple unsuccessful sticks.

## 2017-07-31 NOTE — ED Notes (Signed)
Respiratory at bedside.

## 2017-07-31 NOTE — ED Notes (Signed)
Pt presents from [facility] for evaluation of altered mental status. Pt has early stages of dementia but is normally verbal and aware of situation. Staff noted patient non-verbal and moaning. Pt has catheter. Pt noted to be hypoxic with sats in the low 80's and hypotensive. Pulses thready. Pt hypoglycemic- initially alert and given oral glucose which rose CBG from 42 to 51. Unable to get IV access.

## 2017-07-31 NOTE — ED Notes (Signed)
Xray at bedside; in process.

## 2017-07-31 NOTE — ED Notes (Signed)
CBG 134 

## 2017-07-31 NOTE — ED Notes (Signed)
Multiple attempts of access attempted by this RN, Dr. Ralene Bathe and IV team. Pt has 22 in left forearm and left IO access. IO causing significant pain for patient so fluids changed to IV access. Unable to obtain blood through IV access so Dr. Ralene Bathe performed IV femoral venous stick to obtain blood work that was sent to lab. Pt radial pulses bialterally very faint and thready. Unable to auscultate or palpate BP bilaterally after multiple attempts by this RN and Maurine Minister. Dr. Ralene Bathe aware. Unable to obtain steady pulse-oximetry reading. Pt has been on NRB and suctioned multiple times while in ER. Pt moaning- no guarding noted. Respirations deep and labored. Daughter at bedside updated on patients condition and notes that she would like all measures necessary for patient.

## 2017-07-31 NOTE — ED Notes (Signed)
ED Provider at bedside. 

## 2017-07-31 NOTE — ED Provider Notes (Signed)
[place] DEPT Provider Note   CSN: [id number] Arrival date & time: 07/26/2017  1533     History   Chief Complaint Chief Complaint  Patient presents with  . Altered Mental Status    HPI Katherine Cooper is a 76 y.o. female.  The history is provided by the EMS personnel and a relative. No language interpreter was used.  Altered Mental Status      Katherine Cooper is a 76 y.o. female who presents to the Emergency Department complaining of AMS.  Level V caveat due to AMS.  History is provided by paramedics and the patient's daughter. She has a history of dementia and today has had significant worsening in her mental status. Patient is normally verbal but confused. Staff noted that she has been nonverbal today with moaning. They also noted that she was hypoglycemic but she does not have a history of diabetes. EMS provided oral glucose prior to ED arrival.  Past Medical History:  Diagnosis Date  . Alzheimer disease   . Alzheimer's dementia, late onset, with behavioral disturbance 02/06/2017  . Anemia   . Angina   . Anxiety   . Arthritis   . Blood transfusion   . Breast cancer ([place]) 10/22/2012  . CAP (community acquired pneumonia)   . Chronic kidney disease   . DDD (degenerative disc disease), lumbar   . Dementia   . Dysrhythmia   . GERD without esophagitis 05/28/2017  . Gout   . Headache(784.0)   . Heart murmur   . Hypertension   . Hypothyroidism   . Obesity   . Recurrent upper respiratory infection (URI)   . Shortness of breath 05/26/2013  . Stroke [facility])     Patient Active Problem List   Diagnosis Date Noted  . Hypokalemia 05/28/2017  . Failure to thrive in adult 05/28/2017  . GERD without esophagitis 05/28/2017  . Anemia due to chronic renal failure treated with erythropoietin, stage 4 (severe) ([place]) 05/28/2017  . Overactive bladder 05/28/2017  . Terminal care   . Goals of care, counseling/discussion   . Palliative care by specialist   . Pressure injury of  skin 05/15/2017  . Acute encephalopathy 05/14/2017  . Face, Legs, Activity, Cry, Consolability (FLACC) pain assessment scale cry score 2, crying steadily, screams or sobs, frequent complaints 05/14/2017  . Hypercalcemia 05/14/2017  . Alzheimer's dementia, late onset, with behavioral disturbance 02/06/2017  . Leukocytosis 02/06/2017  . Normocytic anemia 02/06/2017  . ARF (acute renal failure) ([place]) 10/12/2013  . SOB (shortness of breath) 05/26/2013  . Breast cancer ([place]) 10/22/2012  . Dehydration 07/22/2012  . Chronic pain 07/22/2012  . Anemia 07/22/2012  . Hypotension 07/21/2012  . Anxiety   . Hyperkalemia 07/08/2012  . Essential hypertension 07/08/2012  . CKD (chronic kidney disease) stage 4, GFR 15-29 ml/min (H[facility]) 07/08/2012  . Chest pain at rest 11/12/2011    Class: Acute  . Bilateral leg edema 11/12/2011    Class: Chronic  . Obesity (BMI 30-39.9) 11/12/2011    Class: Chronic  . Gout 11/12/2011    Class: Chronic    Past Surgical History:  Procedure Laterality Date  . UTERINE FIBROID SURGERY      OB History    No data available       Home Medications    Prior to Admission medications   Medication Sig Start Date End Date Taking? Authorizing Provider  aspirin EC 81 MG tablet Take 81 mg by mouth daily.    [provider]  cetirizine (  ZYRTEC) 10 MG tablet Take 10 mg by mouth daily.     [provider]  donepezil (ARICEPT) 10 MG tablet Take 10 mg by mouth at bedtime.     [provider]  fluticasone (FLONASE) 50 MCG/ACT nasal spray Place 2 sprays into both nostrils daily.     [provider]  HYDROcodone-acetaminophen (NORCO/VICODIN) 5-325 MG tablet Take 1 tablet by mouth every 8 (eight) hours as needed for moderate pain or severe pain. 05/24/17   Hongalgi, Lenis Dickinson, MD  Memantine HCl ER (NAMENDA XR) 28 MG CP24 Take 1 capsule by mouth every morning.     [provider]  mirtazapine (REMERON) 15 MG tablet Take 15 mg by mouth at  bedtime.    [provider]  Multiple Vitamin (MULTIVITAMIN WITH MINERALS) TABS tablet Take 1 tablet by mouth daily.    [provider]  NUTRITIONAL SUPPLEMENT LIQD Give 240cc three times a day between meals    [provider]  oxybutynin (DITROPAN-XL) 5 MG 24 hr tablet Take 5 mg by mouth daily. 02/02/17   [provider]  pantoprazole (PROTONIX) 40 MG tablet Take 40 mg by mouth daily.     [provider]  phenylephrine-shark liver oil-mineral oil-petrolatum (PREPARATION H) 0.25-3-14-71.9 % rectal ointment Place 1 application rectally 2 (two) times daily as needed for hemorrhoids.    [provider]  senna-docusate (SENOKOT-S) 8.6-50 MG tablet Take 2 tablets by mouth at bedtime.    [provider]  simvastatin (ZOCOR) 10 MG tablet Take 10 mg by mouth at bedtime.    [provider]    Family History Family History  Problem Relation Age of Onset  . Asthma Mother   . Heart attack Father   . Kidney failure Brother     Social History Social History  Substance Use Topics  . Smoking status: Former Smoker    Packs/day: 0.25    Types: Cigarettes    Quit date: 03/13/2012  . Smokeless tobacco: Never Used  . Alcohol use No     Allergies   Sulfa antibiotics   Review of Systems Review of Systems  Unable to perform ROS: Mental status change     Physical Exam Updated Vital Signs BP (!) 84/69   Pulse (!) 140   Resp (!) 26   Wt 77.2 kg (170 lb 3.1 oz)   SpO2 94%   BMI 28.32 kg/m   Physical Exam  Constitutional: She appears well-developed. She appears distressed.  Ill-appearing  HENT:  Head: Normocephalic and atraumatic.  Cardiovascular: Regular rhythm.   No murmur heard. Tachycardiac  Pulmonary/Chest: Effort normal. No respiratory distress.  Crackles in bilateral bases, right greater than left  Abdominal: Soft. There is no tenderness. There is no rebound and no guarding.  Genitourinary:  Genitourinary  Comments: Sacral wounds without significant exudate or surrounding edema.  Musculoskeletal: She exhibits no tenderness.  1+ edema to the left lower extremity, trace pitting edema to the left upper extremity  Neurological: She is alert.  Dysarthric speech, profound generalized weakness left greater than right. Disoriented place and time  Skin: Skin is warm and dry.  Psychiatric:  Unable to assess  Nursing note and vitals reviewed.    ED Treatments / Results  Labs (all labs ordered are listed, but only abnormal results are displayed) Labs Reviewed  CBG MONITORING, ED - Abnormal; Notable for the following:       Result Value   Glucose-Capillary 49 (*)    All other components  within normal limits  URINE CULTURE  CULTURE, BLOOD (ROUTINE X 2)  CULTURE, BLOOD (ROUTINE X 2)  COMPREHENSIVE METABOLIC PANEL  CBC WITH DIFFERENTIAL/PLATELET  URINALYSIS, ROUTINE W REFLEX MICROSCOPIC  I-STAT CHEM 8, ED  I-STAT CG4 LACTIC ACID, ED  I-STAT TROPONIN, ED    EKG  EKG Interpretation None       Radiology Dg Chest Port 1 View  Result Date: 08/16/2017 CLINICAL DATA:  Mental status changes. EXAM: PORTABLE CHEST 1 VIEW COMPARISON:  05/14/2017 FINDINGS: The heart size is within normal limits. There is stable prominence and tortuosity of the thoracic aorta. Lung volumes are low bilaterally. There is no evidence of pulmonary edema, consolidation, pneumothorax, nodule or pleural fluid. IMPRESSION: No active disease. Electronically Signed   By: Aletta Edouard M.D.   On: 08/11/2017 16:04    Procedures IO LINE INSERTION Date/Time: 08/02/2017 1:01 AM Performed by: Quintella Reichert Authorized by: Quintella Reichert   Consent:    Consent obtained:  Emergent situation Pre-procedure details:    Site preparation:  Alcohol Anesthesia (see MAR for exact dosages):    Anesthesia method:  None Procedure details:    Insertion site:  L proximal tibia   Insertion device:  Drill device   Insertion: Needle was  inserted through the bony cortex     Number of attempts:  1   Insertion confirmation:  Easy infusion of fluids, stability of the needle and aspiration of blood/marrow Post-procedure details:    Patient tolerance of procedure:  Tolerated well, no immediate complications   (including critical care time) CRITICAL CARE Performed by: Quintella Reichert   Total critical care time: 35 minutes  Critical care time was exclusive of separately billable procedures and treating other patients.  Critical care was necessary to treat or prevent imminent or life-threatening deterioration.  Critical care was time spent personally by me on the following activities: development of treatment plan with patient and/or surrogate as well as nursing, discussions with consultants, evaluation of patient's response to treatment, examination of patient, obtaining history from patient or surrogate, ordering and performing treatments and interventions, ordering and review of laboratory studies, ordering and review of radiographic studies, pulse oximetry and re-evaluation of patient's condition.     IO Medications Ordered in ED Medications  dextrose 50 % solution 50 mL (not administered)  sodium chloride 0.9 % bolus 1,000 mL (not administered)  lidocaine (XYLOCAINE) 2 % injection 20 mg (not administered)  piperacillin-tazobactam (ZOSYN) IVPB 3.375 g (not administered)  glucagon (human recombinant) (GLUCAGEN) injection 1 mg (1 mg Intravenous Given 08/13/2017 1600)     Initial Impression / Assessment and Plan / ED Course  I have reviewed the triage vital signs and the nursing notes.  Pertinent labs & imaging results that were available during my care of the patient were reviewed by me and considered in my medical decision making (see chart for details).     Patient with history of dementia here with altered mental status and hypoglycemia from nursing facility. Initial reports the patient is DO NOT RESUSCITATE and on  palliative care. She was treated with oral glucose prior to ED arrival.  She was given IM glucagon with no significant improvement in her sugar. There was difficulty in establishing IV access. When family was available they note change in her CODE STATUS to full code. IO was placed for access to minister dextrose and fluids. She was started on IV fluids, antibiotics for possible UTI. During ED stay she developed hypotension refractory to IV fluids and was  started on pressors. Labs demonstrate acute renal failure. Family updated a findings studies and critical nature of illness. Critical care consulted for admission for further treatment.  Final Clinical Impressions(s) / ED Diagnoses   Final diagnoses:  None    New Prescriptions New Prescriptions   No medications on file     Quintella Reichert, MD 08/02/17 0109

## 2017-07-31 NOTE — ED Notes (Signed)
Patient transported to CT by RN Benjamine Mola

## 2017-07-31 NOTE — ED Notes (Signed)
Granddaughter, Queen at bedside

## 2017-07-31 NOTE — ED Notes (Signed)
Pt taken off NRB by NP. Maintaining O2 stats at 96% room air. Family at bedside.

## 2017-07-31 NOTE — ED Notes (Signed)
IO access clotting. NP at bedside.

## 2017-07-31 NOTE — ED Notes (Signed)
Phlebotomy at bedside.

## 2017-07-31 NOTE — ED Notes (Signed)
Ed provider notified of pt moaning in pain.

## 2017-07-31 NOTE — Procedures (Signed)
Central Venous Catheter Insertion Procedure Note Katherine Cooper [id number] Jun 05, 1941  Procedure: Insertion of Central Venous Catheter Indications: Assessment of intravascular volume, Drug and/or fluid administration and Frequent blood sampling  Procedure Details Consent: Unable to obtain consent because of emergent medical necessity. Time Out: Verified patient identification, verified procedure, site/side was marked, verified correct patient position, special equipment/implants available, medications/allergies/relevent history reviewed, required imaging and test results available.  Performed  Maximum sterile technique was used including antiseptics, cap, gloves, gown, hand hygiene, mask and sheet. Skin prep: Chlorhexidine; local anesthetic administered A antimicrobial bonded/coated triple lumen catheter was placed in the left internal jugular vein using the Seldinger technique.  Evaluation Blood flow good Complications: No apparent complications Patient did tolerate procedure well. Chest X-ray ordered to verify placement.  CXR: pending.  Hayden Pedro, AGACNP-BC [facility]  Pgr: [phone]  PCCM Pgr: [phone]

## 2017-07-31 NOTE — ED Notes (Signed)
Bladder scan completed. 30 mls in bladder.

## 2017-07-31 NOTE — ED Notes (Signed)
ICU MD at bedside. Pt put on contact precautions.

## 2017-07-31 NOTE — ED Notes (Signed)
US at bedside

## 2017-08-01 ENCOUNTER — Inpatient Hospital Stay (HOSPITAL_COMMUNITY): Payer: Medicare Other

## 2017-08-01 ENCOUNTER — Encounter (HOSPITAL_COMMUNITY): Payer: Self-pay | Admitting: *Deleted

## 2017-08-01 DIAGNOSIS — K625 Hemorrhage of anus and rectum: Secondary | ICD-10-CM

## 2017-08-01 DIAGNOSIS — L89143 Pressure ulcer of left lower back, stage 3: Secondary | ICD-10-CM

## 2017-08-01 DIAGNOSIS — R7881 Bacteremia: Secondary | ICD-10-CM

## 2017-08-01 DIAGNOSIS — G301 Alzheimer's disease with late onset: Secondary | ICD-10-CM

## 2017-08-01 DIAGNOSIS — A4101 Sepsis due to Methicillin susceptible Staphylococcus aureus: Secondary | ICD-10-CM

## 2017-08-01 DIAGNOSIS — K5641 Fecal impaction: Secondary | ICD-10-CM

## 2017-08-01 DIAGNOSIS — R571 Hypovolemic shock: Secondary | ICD-10-CM

## 2017-08-01 DIAGNOSIS — F0281 Dementia in other diseases classified elsewhere with behavioral disturbance: Secondary | ICD-10-CM

## 2017-08-01 LAB — BLOOD CULTURE ID PANEL (REFLEXED)

## 2017-08-01 LAB — PHOSPHORUS: PHOSPHORUS: 3.5 mg/dL (ref 2.5–4.6)

## 2017-08-01 LAB — CBC
HEMATOCRIT: 24.9 % — AB (ref 36.0–46.0)
HEMATOCRIT: 27.6 % — AB (ref 36.0–46.0)
HEMOGLOBIN: 7.6 g/dL — AB (ref 12.0–15.0)
Hemoglobin: 8.9 g/dL — ABNORMAL LOW (ref 12.0–15.0)
MCH: 27.7 pg (ref 26.0–34.0)
MCH: 28.4 pg (ref 26.0–34.0)
MCHC: 30.5 g/dL (ref 30.0–36.0)
MCHC: 32.2 g/dL (ref 30.0–36.0)
MCV: 90.9 fL (ref 78.0–100.0)
MCV: 88.2 fL (ref 78.0–100.0)
PLATELETS: 226 10*3/uL (ref 150–400)
Platelets: 275 10*3/uL (ref 150–400)
RBC: 2.74 MIL/uL — ABNORMAL LOW (ref 3.87–5.11)
RBC: 3.13 MIL/uL — ABNORMAL LOW (ref 3.87–5.11)
RDW: 20.3 % — ABNORMAL HIGH (ref 11.5–15.5)
RDW: 18.5 % — AB (ref 11.5–15.5)
WBC: 18.8 10*3/uL — ABNORMAL HIGH (ref 4.0–10.5)
WBC: 21.1 10*3/uL — AB (ref 4.0–10.5)

## 2017-08-01 LAB — GLUCOSE, CAPILLARY
GLUCOSE-CAPILLARY: 136 mg/dL — AB (ref 65–99)
GLUCOSE-CAPILLARY: 142 mg/dL — AB (ref 65–99)
GLUCOSE-CAPILLARY: 146 mg/dL — AB (ref 65–99)
GLUCOSE-CAPILLARY: 89 mg/dL (ref 65–99)
Glucose-Capillary: 165 mg/dL — ABNORMAL HIGH (ref 65–99)
Glucose-Capillary: 128 mg/dL — ABNORMAL HIGH (ref 65–99)
Glucose-Capillary: 138 mg/dL — ABNORMAL HIGH (ref 65–99)

## 2017-08-01 LAB — COOXEMETRY PANEL
Carboxyhemoglobin: 1 % (ref 0.5–1.5)
Methemoglobin: 1.5 % (ref 0.0–1.5)
O2 Saturation: 98.2 %
Total hemoglobin: 9 g/dL — ABNORMAL LOW (ref 12.0–16.0)

## 2017-08-01 LAB — PROTIME-INR
INR: 2.14
PROTHROMBIN TIME: 24.3 s — AB (ref 11.4–15.2)

## 2017-08-01 LAB — BASIC METABOLIC PANEL
ANION GAP: 11 (ref 5–15)
BUN: 63 mg/dL — ABNORMAL HIGH (ref 6–20)
CHLORIDE: 117 mmol/L — AB (ref 101–111)
CO2: 10 mmol/L — AB (ref 22–32)
Calcium: 9 mg/dL (ref 8.9–10.3)
Creatinine, Ser: 3.98 mg/dL — ABNORMAL HIGH (ref 0.44–1.00)
GFR calc Af Amer: 12 mL/min — ABNORMAL LOW (ref >60)
GFR, EST NON AFRICAN AMERICAN: 10 mL/min — AB (ref >60)
GLUCOSE: 141 mg/dL — AB (ref 65–99)
POTASSIUM: 4.6 mmol/L (ref 3.5–5.1)
Sodium: 138 mmol/L (ref 135–145)

## 2017-08-01 LAB — BLOOD GAS, ARTERIAL
ACID-BASE DEFICIT: 14.6 mmol/L — AB (ref 0.0–2.0)
BICARBONATE: 10.6 mmol/L — AB (ref 20.0–28.0)
Drawn by: 406621
FIO2: 21
O2 Saturation: 97.4 %
PATIENT TEMPERATURE: 98.6
pCO2 arterial: 22.2 mmHg — ABNORMAL LOW (ref 32.0–48.0)
pH, Arterial: 7.301 — ABNORMAL LOW (ref 7.350–7.450)
pO2, Arterial: 101 mmHg (ref 83.0–108.0)

## 2017-08-01 LAB — URINALYSIS, ROUTINE W REFLEX MICROSCOPIC
BILIRUBIN URINE: NEGATIVE
Bacteria, UA: NONE SEEN
Glucose, UA: NEGATIVE mg/dL
Ketones, ur: NEGATIVE mg/dL
Nitrite: NEGATIVE
PH: 5 (ref 5.0–8.0)
Protein, ur: 30 mg/dL — AB
SPECIFIC GRAVITY, URINE: 1.015 (ref 1.005–1.030)

## 2017-08-01 LAB — POCT I-STAT 3, ART BLOOD GAS (G3+)
ACID-BASE DEFICIT: 10 mmol/L — AB (ref 0.0–2.0)
Bicarbonate: 13.6 mmol/L — ABNORMAL LOW (ref 20.0–28.0)
O2 SAT: 97 %
PCO2 ART: 21.8 mmHg — AB (ref 32.0–48.0)
PH ART: 7.405 (ref 7.350–7.450)
TCO2: 14 mmol/L (ref 0–100)
pO2, Arterial: 87 mmHg (ref 83.0–108.0)

## 2017-08-01 LAB — HEMOGLOBIN AND HEMATOCRIT, BLOOD
HCT: 28.5 % — ABNORMAL LOW (ref 36.0–46.0)
Hemoglobin: 9.2 g/dL — ABNORMAL LOW (ref 12.0–15.0)

## 2017-08-01 LAB — PREPARE RBC (CROSSMATCH)

## 2017-08-01 LAB — LACTIC ACID, PLASMA
LACTIC ACID, VENOUS: 4.1 mmol/L — AB (ref 0.5–1.9)
LACTIC ACID, VENOUS: 1.4 mmol/L (ref 0.5–1.9)
Lactic Acid, Venous: 2.5 mmol/L (ref 0.5–1.9)

## 2017-08-01 LAB — PROCALCITONIN: Procalcitonin: 13.42 ng/mL

## 2017-08-01 LAB — LIPASE, BLOOD: Lipase: 79 U/L — ABNORMAL HIGH (ref 11–51)

## 2017-08-01 LAB — MAGNESIUM: Magnesium: 1.7 mg/dL (ref 1.7–2.4)

## 2017-08-01 LAB — CORTISOL: CORTISOL PLASMA: 32 ug/dL

## 2017-08-01 LAB — MRSA PCR SCREENING: MRSA by PCR: POSITIVE — AB

## 2017-08-01 LAB — AMYLASE: Amylase: 47 U/L (ref 28–100)

## 2017-08-01 LAB — FIBRINOGEN: FIBRINOGEN: 103 mg/dL — AB (ref 210–475)

## 2017-08-01 MED ORDER — PNEUMOCOCCAL VAC POLYVALENT 25 MCG/0.5ML IJ INJ: 0.5000 mL | INJECTION | INTRAMUSCULAR | Status: DC | PRN

## 2017-08-01 MED ORDER — PANTOPRAZOLE SODIUM 40 MG IV SOLR: 40.0000 mg | Freq: Two times a day (BID) | INTRAVENOUS | Status: DC

## 2017-08-01 MED ORDER — CHLORHEXIDINE GLUCONATE CLOTH 2 % EX PADS
6.0000 | MEDICATED_PAD | Freq: Every day | CUTANEOUS | Status: DC
  Administered 2017-08-01 – 2017-08-03 (×2): 6 via TOPICAL

## 2017-08-01 MED ORDER — CHLORHEXIDINE GLUCONATE 0.12 % MT SOLN
15.0000 mL | Freq: Two times a day (BID) | OROMUCOSAL | Status: DC
  Administered 2017-08-01 – 2017-08-06 (×7): 15 mL via OROMUCOSAL

## 2017-08-01 MED ORDER — ORAL CARE MOUTH RINSE
15.0000 mL | Freq: Two times a day (BID) | OROMUCOSAL | Status: DC
  Administered 2017-08-01 – 2017-08-06 (×9): 15 mL via OROMUCOSAL

## 2017-08-01 MED ORDER — SODIUM CHLORIDE 0.9 % IV SOLN: Freq: Once | INTRAVENOUS | Status: AC

## 2017-08-01 MED ORDER — SODIUM CHLORIDE 0.9% FLUSH: 10.0000 mL | INTRAVENOUS | Status: DC | PRN

## 2017-08-01 MED ORDER — MAGNESIUM SULFATE 2 GM/50ML IV SOLN
2.0000 g | Freq: Once | INTRAVENOUS | Status: AC
  Administered 2017-08-01: 2 g via INTRAVENOUS
  Filled 2017-08-01: qty 50

## 2017-08-01 MED ORDER — MUPIROCIN 2 % EX OINT
1.0000 "application " | TOPICAL_OINTMENT | Freq: Two times a day (BID) | CUTANEOUS | Status: AC
  Administered 2017-08-01 – 2017-08-04 (×8): 1 via NASAL
  Filled 2017-08-01: qty 22

## 2017-08-01 MED ORDER — CHLORHEXIDINE GLUCONATE CLOTH 2 % EX PADS
6.0000 | MEDICATED_PAD | Freq: Every day | CUTANEOUS | Status: DC
  Administered 2017-08-02 – 2017-08-04 (×3): 6 via TOPICAL

## 2017-08-01 MED ORDER — SODIUM CHLORIDE 0.9 % IV SOLN
8.0000 mg/h | INTRAVENOUS | Status: DC
  Filled 2017-08-01: qty 80

## 2017-08-01 MED ORDER — HYDROCORTISONE NA SUCCINATE PF 100 MG IJ SOLR
50.0000 mg | Freq: Four times a day (QID) | INTRAMUSCULAR | Status: DC
  Administered 2017-08-01 (×2): 50 mg via INTRAVENOUS
  Filled 2017-08-01 (×2): qty 1
  Filled 2017-08-01: qty 2

## 2017-08-01 MED ORDER — VANCOMYCIN HCL IN DEXTROSE 1-5 GM/200ML-% IV SOLN
1000.0000 mg | INTRAVENOUS | Status: DC
  Administered 2017-08-03: 1000 mg via INTRAVENOUS
  Filled 2017-08-01: qty 200

## 2017-08-01 MED ORDER — BISACODYL 10 MG RE SUPP: 10.0000 mg | Freq: Every day | RECTAL | Status: DC

## 2017-08-01 MED ORDER — PANTOPRAZOLE SODIUM 40 MG IV SOLR
40.0000 mg | Freq: Two times a day (BID) | INTRAVENOUS | Status: DC
  Administered 2017-08-01 – 2017-08-05 (×9): 40 mg via INTRAVENOUS
  Filled 2017-08-01 (×9): qty 40

## 2017-08-01 MED ORDER — SODIUM CHLORIDE 0.9 % IV SOLN
80.0000 mg | Freq: Once | INTRAVENOUS | Status: DC
  Filled 2017-08-01: qty 80

## 2017-08-01 MED ORDER — CHLORHEXIDINE GLUCONATE CLOTH 2 % EX PADS: 6.0000 | MEDICATED_PAD | Freq: Every day | CUTANEOUS | Status: DC

## 2017-08-01 MED ORDER — SODIUM CHLORIDE 0.9 % IV BOLUS (SEPSIS)
1000.0000 mL | Freq: Once | INTRAVENOUS | Status: AC
  Administered 2017-08-01: 1000 mL via INTRAVENOUS

## 2017-08-01 MED ORDER — VITAMIN K1 10 MG/ML IJ SOLN
5.0000 mg | Freq: Every day | INTRAVENOUS | Status: DC
  Administered 2017-08-01: 5 mg via INTRAVENOUS
  Filled 2017-08-01: qty 0.5

## 2017-08-01 NOTE — Progress Notes (Signed)
eLink Physician-Brief Progress Note Patient Name: Israa Caban DOB: 1941-12-22 MRN: [id number]   Date of Service  08/01/2017  HPI/Events of Note  INR elevated.  eICU Interventions  Will transfuse 1 unit FFP also.     Intervention Category Major Interventions: Other:  Sada Mazzoni 08/01/2017, 5:26 AM

## 2017-08-01 NOTE — Consult Note (Signed)
[facility] for Infectious Disease    Date of Admission:  08/07/2017   Total days of antibiotics  2        Day 2    Vancomycin        Zosyn stopped 8/7        Meropenem stopped 8/8       Reason for Consult: Auto-consult for MRSA    Referring Provider: Dr. Ashok Cordia Primary Care Provider: Dr. Nolene Ebbs  Assessment and plan:  MRSA Bacteremia Septic Shock  The patient was found to have MRSA per biofire results. Decubitus ulcer on patient's back is the likely source of her infection.  -Continue to monitor blood culture results -Continue vancomycin  -Vancomycin dosing and trough per pharmacy assistance  -Discontinue meropenem -consider palliative care consult to discuss goals of care as pt was previously DNR but then changed to full code by daughters.  -Central line is present which has risk of being infected. Consider removing once stabilizes -TTE to monitor for vegetations       [place] Staphylococcus aureus bacteremia   Staphylococcus aureus bacteremia (SAB) is associated with a high rate of complications and mortality.  Specific aspects of clinical management are critical to optimizing the outcome of patients with SAB.  Therefore, the [name] has initiated an intervention aimed at improving the management of SAB at [name].  To do so, Infectious Diseases physicians are providing an evidence-based consult for the management of all patients with SAB.     Yes No Comments  Perform follow-up blood cultures (even if the patient is afebrile) to ensure clearance of bacteremia [x]  []    Remove vascular catheter and obtain follow-up blood cultures after the removal of the catheter []  []  Needs cvc, but please remove once not necessary and give cath holiday  Perform echocardiography to evaluate for endocarditis (transthoracic ECHO is 40-50% sensitive, TEE is > 90% sensitive) [x]  []  Please keep in mind,  that neither test can definitively EXCLUDE endocarditis, and that should clinical suspicion remain high for endocarditis the patient should then still be treated with an "endocarditis" duration of therapy = 6 weeks  Consult electrophysiologist to evaluate implanted cardiac device (pacemaker, ICD) []  [x]    Ensure source control []  []  Have all abscesses been drained effectively? Have deep seeded infections (septic joints or osteomyelitis) had appropriate surgical debridement?  Investigate for "metastatic" sites of infection []  []  Does the patient have ANY symptom or physical exam finding that would suggest a deeper infection (back or neck pain that may be suggestive of vertebral osteomyelitis or epidural abscess, muscle pain that could be a symptom of pyomyositis)?  Keep in mind that for deep seeded infections MRI imaging with contrast is preferred rather than other often insensitive tests such as plain x-rays, especially early in a patient's presentation.  Change antibiotic therapy to __________________ []  []  Beta-lactam antibiotics are preferred for MSSA due to higher cure rates.   If on Vancomycin, goal trough should be 15 - 20 mcg/mL  Estimated duration of IV antibiotic therapy:   []  []  Consult case management for probably prolonged outpatient IV antibiotic therapy     . chlorhexidine  15 mL Mouth Rinse BID  . Chlorhexidine Gluconate Cloth  6 each Topical Q0600  . mouth rinse  15 mL Mouth Rinse q12n4p  . mupirocin ointment  1 application Nasal BID  . pantoprazole (PROTONIX) IV  40 mg Intravenous Q12H  HPI: Katherine Cooper is a 76 y.o. female with pmh of alzheimer's, ckd stage 4, gerd, gout, htn, hypothyroid, and cva who presented with lethargy. The patient was reported to be nonverbal and moaning for the past few days. At baseline the patient is usually verbal, but confused. She has been staying at a SNF since 01/2017 and reported to not get out of bed due to her decubitus ulcers being  painful.   At ED the patient was reported to have a wbc=18.7, la=2.1, bp=84/69 and glu=4. The patient was started on vancomycin, meropenem (previous esbl klebsiella uti), and given po glucose. The patient was found to be mrsa positive per biofire results.   Review of Systems: ROS as above   Past Medical History:  Diagnosis Date  . Alzheimer disease   . Alzheimer's dementia, late onset, with behavioral disturbance 02/06/2017  . Anemia   . Angina   . Anxiety   . Arthritis   . Blood transfusion   . Breast cancer ([place]) 10/22/2012  . CAP (community acquired pneumonia)   . Chronic kidney disease   . DDD (degenerative disc disease), lumbar   . Dementia   . Dysrhythmia   . GERD without esophagitis 05/28/2017  . Gout   . Headache(784.0)   . Heart murmur   . Hypertension   . Hypothyroidism   . Obesity   . Recurrent upper respiratory infection (URI)   . Shortness of breath 05/26/2013  . Stroke [facility])     Social History  Substance Use Topics  . Smoking status: Former Smoker    Packs/day: 0.25    Types: Cigarettes    Quit date: 03/13/2012  . Smokeless tobacco: Never Used  . Alcohol use No    Family History  Problem Relation Age of Onset  . Asthma Mother   . Heart attack Father   . Kidney failure Brother    Allergies  Allergen Reactions  . Sulfa Antibiotics Itching    OBJECTIVE: Blood pressure (!) 111/98, pulse (!) 112, temperature 98.3 F (36.8 C), temperature source Oral, resp. rate 13, height 5\' 3"  (1.6 m), weight 170 lb 3.1 oz (77.2 kg), SpO2 100 %.  Physical Exam  Constitutional: She appears unhealthy. She appears distressed.  Neck:  Central line present   Cardiovascular: Regular rhythm, normal heart sounds and intact distal pulses.  Tachycardia present.   Pulmonary/Chest: She has rales.  Abdominal: Soft. She exhibits distension. There is no tenderness.  Musculoskeletal: She exhibits edema.  Skin: No rash noted.    Lab Results Lab Results  Component Value Date     WBC 21.1 (H) 08/01/2017   HGB 8.9 (L) 08/01/2017   HCT 27.6 (L) 08/01/2017   MCV 88.2 08/01/2017   PLT 226 08/01/2017    Lab Results  Component Value Date   CREATININE 3.98 (H) 08/01/2017   BUN 63 (H) 08/01/2017   NA 138 08/01/2017   K 4.6 08/01/2017   CL 117 (H) 08/01/2017   CO2 10 (L) 08/01/2017    Lab Results  Component Value Date   ALT 60 (H) 08/16/2017   AST 52 (H) 08/21/2017   ALKPHOS 165 (H) 08/19/2017   BILITOT 1.8 (H) 08/02/2017     Microbiology: Recent Results (from the past 240 hour(s))  Culture, blood (routine x 2)     Status: None (Preliminary result)   Collection Time: 08/12/2017  6:19 PM  Result Value Ref Range Status   Specimen Description BLOOD FEMORAL ARTERY VEIN  Final   Special Requests  IN PEDIATRIC BOTTLE Blood Culture adequate volume  Final   Culture  Setup Time   Final    GRAM POSITIVE COCCI IN PEDIATRIC BOTTLE Organism ID to follow CRITICAL RESULT CALLED TO, READ BACK BY AND VERIFIED WITH: E. Vernona Rieger Pharm.D. 10:40 08/01/17 ([email or web address])    Culture GRAM POSITIVE COCCI  Final   Report Status PENDING  Incomplete  Blood Culture ID Panel (Reflexed)     Status: Abnormal   Collection Time: 08/11/2017  6:19 PM  Result Value Ref Range Status   Enterococcus species NOT DETECTED NOT DETECTED Final   Listeria monocytogenes NOT DETECTED NOT DETECTED Final   Staphylococcus species DETECTED (A) NOT DETECTED Final    Comment: CRITICAL RESULT CALLED TO, READ BACK BY AND VERIFIED WITH: E. Vernona Rieger Pharm.D. 10:40 08/01/17 ([email or web address])    Staphylococcus aureus DETECTED (A) NOT DETECTED Final    Comment: Methicillin (oxacillin)-resistant Staphylococcus aureus (MRSA). MRSA is predictably resistant to beta-lactam antibiotics (except ceftaroline). Preferred therapy is vancomycin unless clinically contraindicated. Patient requires contact precautions if  hospitalized. CRITICAL RESULT CALLED TO, READ BACK BY AND VERIFIED WITH: E. Vernona Rieger Pharm.D. 10:40 08/01/17 (wilsonm)     Methicillin resistance DETECTED (A) NOT DETECTED Final    Comment: CRITICAL RESULT CALLED TO, READ BACK BY AND VERIFIED WITH: E. Vernona Rieger Pharm.D. 10:40 08/01/17 (wilsonm)    Streptococcus species NOT DETECTED NOT DETECTED Final   Streptococcus agalactiae NOT DETECTED NOT DETECTED Final   Streptococcus pneumoniae NOT DETECTED NOT DETECTED Final   Streptococcus pyogenes NOT DETECTED NOT DETECTED Final   Acinetobacter baumannii NOT DETECTED NOT DETECTED Final   Enterobacteriaceae species NOT DETECTED NOT DETECTED Final   Enterobacter cloacae complex NOT DETECTED NOT DETECTED Final   Escherichia coli NOT DETECTED NOT DETECTED Final   Klebsiella oxytoca NOT DETECTED NOT DETECTED Final   Klebsiella pneumoniae NOT DETECTED NOT DETECTED Final   Proteus species NOT DETECTED NOT DETECTED Final   Serratia marcescens NOT DETECTED NOT DETECTED Final   Haemophilus influenzae NOT DETECTED NOT DETECTED Final   Neisseria meningitidis NOT DETECTED NOT DETECTED Final   Pseudomonas aeruginosa NOT DETECTED NOT DETECTED Final   Candida albicans NOT DETECTED NOT DETECTED Final   Candida glabrata NOT DETECTED NOT DETECTED Final   Candida krusei NOT DETECTED NOT DETECTED Final   Candida parapsilosis NOT DETECTED NOT DETECTED Final   Candida tropicalis NOT DETECTED NOT DETECTED Final  Culture, blood (routine x 2)     Status: None (Preliminary result)   Collection Time: 08/23/2017  6:22 PM  Result Value Ref Range Status   Specimen Description BLOOD RIGHT WRIST  Final   Special Requests   Final    BOTTLES DRAWN AEROBIC ONLY Blood Culture adequate volume   Culture NO GROWTH < 24 HOURS  Final   Report Status PENDING  Incomplete  MRSA PCR Screening     Status: Abnormal   Collection Time: 08/01/17 12:41 AM  Result Value Ref Range Status   MRSA by PCR POSITIVE (A) NEGATIVE Final    Comment:        The GeneXpert MRSA Assay (FDA approved for NASAL specimens only), is one component of a comprehensive MRSA  colonization surveillance program. It is not intended to diagnose MRSA infection nor to guide or monitor treatment for MRSA infections. CRITICAL RESULT CALLED TO, READ BACK BY AND VERIFIED WITH: A. LEWIS, RN AT [phone] ON 08/01/17 BY C, JESSUP, MLT.     Lars Mage, [place] [facility] [phone] pager  [phone] cell 08/01/2017, 6:47 PM

## 2017-08-01 NOTE — Consult Note (Signed)
Date of Admission:  08/07/2017          Reason for Consult: MRSA bacteremia    Referring Provider: Terrilyn Saver Auto-consult, and Dr. Ashok Cordia  Assessment: #1 MRSA bacteremia, rule out deep infection including endocarditis #2 Central line placed in setting of bacteremia #3 Dementia #4 Bed bound status  Plan: 1. Continue IV vancomycin 2. Repeat blood cultures 3. IF she can survive then DC central line when able and give central line holiday 4. TTE 5. Examine decubitus ulcers when stabble 6. STRONGLY consider palliative care consult        [place] [facility] Management Team Staphylococcus aureus bacteremia   Staphylococcus aureus bacteremia (SAB) is associated with a high rate of complications and mortality.  Specific aspects of clinical management are critical to optimizing the outcome of patients with SAB.  Therefore, the [facility] Health Antimicrobial Management Team [facility]) has initiated an intervention aimed at improving the management of SAB at [facility].  To do so, Infectious Diseases physicians are providing an evidence-based consult for the management of all patients with SAB.     Yes No Comments  Perform follow-up blood cultures (even if the patient is afebrile) to ensure clearance of bacteremia [x]  []    Remove vascular catheter and obtain follow-up blood cultures after the removal of the catheter [x]  []    Perform echocardiography to evaluate for endocarditis (transthoracic ECHO is 40-50% sensitive, TEE is > 90% sensitive) [x]  []  Please keep in mind, that neither test can definitively EXCLUDE endocarditis, and that should clinical suspicion remain high for endocarditis the patient should then still be treated with an "endocarditis" duration of therapy = 6 weeks  Consult electrophysiologist to evaluate implanted cardiac device (pacemaker, ICD) []  []  NA  Ensure source control []  []  Have all abscesses been drained effectively? Have deep seeded infections (septic joints  or osteomyelitis) had appropriate surgical debridement? Source not clear , decubitus ulcer?  Investigate for "metastatic" sites of infection [x]  []  Does the patient have ANY symptom or physical exam finding that would suggest a deeper infection (back or neck pain that may be suggestive of vertebral osteomyelitis or epidural abscess, muscle pain that could be a symptom of pyomyositis)?  Keep in mind that for deep seeded infections MRI imaging with contrast is preferred rather than other often insensitive tests such as plain x-rays, especially early in a patient's presentation.  Change antibiotic therapy to vancomycin []  []  Beta-lactam antibiotics are preferred for MSSA due to higher cure rates.   If on Vancomycin, goal trough should be 15 - 20 mcg/mL  Estimated duration of IV antibiotic therapy:  4-6 weeks []  []  Consult case management for probably prolonged outpatient IV antibiotic therapy     Active Problems:   Sepsis ([place])   . chlorhexidine  15 mL Mouth Rinse BID  . Chlorhexidine Gluconate Cloth  6 each Topical Q0600  . mouth rinse  15 mL Mouth Rinse q12n4p  . mupirocin ointment  1 application Nasal BID  . pantoprazole (PROTONIX) IV  40 mg Intravenous Q12H    HPI: Katherine Cooper is a 76 y.o. female PMH of Alzheimer dementia, Anemia, Breast CA, CKD, GERD, HTN, Hypothyroid, CVA. Presents to ED on 8/7 with progressive lethargy admitted to ICU in septic shock now found to have MRSA bacteremia. She has decubitus ulcers that are so painful that she NEVER attempts to get out of bed since February.     Review of Systems: Review of Systems  Unable  to perform ROS: Critical illness    Past Medical History:  Diagnosis Date  . Alzheimer disease   . Alzheimer's dementia, late onset, with behavioral disturbance 02/06/2017  . Anemia   . Angina   . Anxiety   . Arthritis   . Blood transfusion   . Breast cancer ([place]) 10/22/2012  . CAP (community acquired pneumonia)   . Chronic kidney  disease   . DDD (degenerative disc disease), lumbar   . Dementia   . Dysrhythmia   . GERD without esophagitis 05/28/2017  . Gout   . Headache(784.0)   . Heart murmur   . Hypertension   . Hypothyroidism   . Obesity   . Recurrent upper respiratory infection (URI)   . Shortness of breath 05/26/2013  . Stroke [facility])     Social History  Substance Use Topics  . Smoking status: Former Smoker    Packs/day: 0.25    Types: Cigarettes    Quit date: 03/13/2012  . Smokeless tobacco: Never Used  . Alcohol use No    Family History  Problem Relation Age of Onset  . Asthma Mother   . Heart attack Father   . Kidney failure Brother    Allergies  Allergen Reactions  . Sulfa Antibiotics Itching    OBJECTIVE: Blood pressure 104/72, pulse (!) 112, temperature 98.3 F (36.8 C), temperature source Oral, resp. rate 13, height 5\' 3"  (1.6 m), weight 170 lb 3.1 oz (77.2 kg), SpO2 100 %.  Physical Exam  Constitutional: She appears toxic. She has a sickly appearance.  HENT:  Head: Normocephalic and atraumatic.  Intubated, sedated  Eyes: EOM are normal.  Cardiovascular: Regular rhythm.  Tachycardia present.  Exam reveals no gallop and no friction rub.   No murmur heard. Pulmonary/Chest: She has no wheezes. She has rales. She exhibits tenderness.  Abdominal: Soft. Bowel sounds are normal. She exhibits no distension.  Musculoskeletal: She exhibits edema.  Skin: No erythema.  Central line in neck  Psychiatric:  sedated    Lab Results Lab Results  Component Value Date   WBC 18.8 (H) 08/01/2017   HGB 9.2 (L) 08/01/2017   HCT 28.5 (L) 08/01/2017   MCV 90.9 08/01/2017   PLT 275 08/01/2017    Lab Results  Component Value Date   CREATININE 3.98 (H) 08/01/2017   BUN 63 (H) 08/01/2017   NA 138 08/01/2017   K 4.6 08/01/2017   CL 117 (H) 08/01/2017   CO2 10 (L) 08/01/2017    Lab Results  Component Value Date   ALT 60 (H) 08/23/2017   AST 52 (H) 08/14/2017   ALKPHOS 165 (H) 08/17/2017    BILITOT 1.8 (H) 08/05/2017     Microbiology: Recent Results (from the past 240 hour(s))  Culture, blood (routine x 2)     Status: None (Preliminary result)   Collection Time: 08/05/2017  6:19 PM  Result Value Ref Range Status   Specimen Description BLOOD FEMORAL ARTERY VEIN  Final   Special Requests IN PEDIATRIC BOTTLE Blood Culture adequate volume  Final   Culture  Setup Time   Final    GRAM POSITIVE COCCI IN PEDIATRIC BOTTLE Organism ID to follow CRITICAL RESULT CALLED TO, READ BACK BY AND VERIFIED WITH: E. Vernona Rieger Pharm.D. 10:40 08/01/17 ([email or web address])    Culture GRAM POSITIVE COCCI  Final   Report Status PENDING  Incomplete  Blood Culture ID Panel (Reflexed)     Status: Abnormal   Collection Time: 08/05/2017  6:19 PM  Result Value Ref Range  Status   Enterococcus species NOT DETECTED NOT DETECTED Final   Listeria monocytogenes NOT DETECTED NOT DETECTED Final   Staphylococcus species DETECTED (A) NOT DETECTED Final    Comment: CRITICAL RESULT CALLED TO, READ BACK BY AND VERIFIED WITH: E. Vernona Rieger Pharm.D. 10:40 08/01/17 ([email or web address])    Staphylococcus aureus DETECTED (A) NOT DETECTED Final    Comment: Methicillin (oxacillin)-resistant Staphylococcus aureus (MRSA). MRSA is predictably resistant to beta-lactam antibiotics (except ceftaroline). Preferred therapy is vancomycin unless clinically contraindicated. Patient requires contact precautions if  hospitalized. CRITICAL RESULT CALLED TO, READ BACK BY AND VERIFIED WITH: E. Vernona Rieger Pharm.D. 10:40 08/01/17 (wilsonm)    Methicillin resistance DETECTED (A) NOT DETECTED Final    Comment: CRITICAL RESULT CALLED TO, READ BACK BY AND VERIFIED WITH: E. Vernona Rieger Pharm.D. 10:40 08/01/17 (wilsonm)    Streptococcus species NOT DETECTED NOT DETECTED Final   Streptococcus agalactiae NOT DETECTED NOT DETECTED Final   Streptococcus pneumoniae NOT DETECTED NOT DETECTED Final   Streptococcus pyogenes NOT DETECTED NOT DETECTED Final   Acinetobacter baumannii NOT  DETECTED NOT DETECTED Final   Enterobacteriaceae species NOT DETECTED NOT DETECTED Final   Enterobacter cloacae complex NOT DETECTED NOT DETECTED Final   Escherichia coli NOT DETECTED NOT DETECTED Final   Klebsiella oxytoca NOT DETECTED NOT DETECTED Final   Klebsiella pneumoniae NOT DETECTED NOT DETECTED Final   Proteus species NOT DETECTED NOT DETECTED Final   Serratia marcescens NOT DETECTED NOT DETECTED Final   Haemophilus influenzae NOT DETECTED NOT DETECTED Final   Neisseria meningitidis NOT DETECTED NOT DETECTED Final   Pseudomonas aeruginosa NOT DETECTED NOT DETECTED Final   Candida albicans NOT DETECTED NOT DETECTED Final   Candida glabrata NOT DETECTED NOT DETECTED Final   Candida krusei NOT DETECTED NOT DETECTED Final   Candida parapsilosis NOT DETECTED NOT DETECTED Final   Candida tropicalis NOT DETECTED NOT DETECTED Final  Culture, blood (routine x 2)     Status: None (Preliminary result)   Collection Time: 08/09/2017  6:22 PM  Result Value Ref Range Status   Specimen Description BLOOD RIGHT WRIST  Final   Special Requests   Final    BOTTLES DRAWN AEROBIC ONLY Blood Culture adequate volume   Culture NO GROWTH < 24 HOURS  Final   Report Status PENDING  Incomplete  MRSA PCR Screening     Status: Abnormal   Collection Time: 08/01/17 12:41 AM  Result Value Ref Range Status   MRSA by PCR POSITIVE (A) NEGATIVE Final    Comment:        The GeneXpert MRSA Assay (FDA approved for NASAL specimens only), is one component of a comprehensive MRSA colonization surveillance program. It is not intended to diagnose MRSA infection nor to guide or monitor treatment for MRSA infections. CRITICAL RESULT CALLED TO, READ BACK BY AND VERIFIED WITH: A. LEWIS, RN AT [phone] ON 08/01/17 BY C, JESSUP, MLT.     Alcide Evener, [place] for [facility]ious [facility] [phone] pager    08/01/2017, 6:00 PM

## 2017-08-01 NOTE — Progress Notes (Signed)
CRITICAL VALUE ALERT  Critical Value:  Lactic acid 2.5  Date & Time Notied: 08/01/17 0245  Provider Notified: Dr. Raliegh Ip. Hammonds  Orders Received/Actions taken: no new orders given at this time

## 2017-08-01 NOTE — Consult Note (Signed)
[place] Nurse wound consult note Reason for Consult: Consult requested for ears, heels, and sacrum. 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 Pressure Injury POA: Yes Dressing procedure/placement/frequency: Float heels in Prevalon boots to reduce pressure.  Aquacel to right ear and sacrum to absorb drainage and provide antimicrobial benefits.  Foam dressing to protect from further injury.  Pt is on a low-airloss bed to reduce pressure.  No family in the room to discuss plan of care and the patient does not understand. Please re-consult if further assistance is needed.  Thank-you,  Julien Girt MSN, [place], [place], [place], [place]

## 2017-08-01 NOTE — Progress Notes (Addendum)
Called to patient bedside as bedside RN noted bright red bleeding per rectum.   CBC and Type and Screen to be sent GI consulted. > will see in AM, concerned this could be due to impaction of stool   Katherine Cooper, AGACNP-BC [facility]  Pgr: [phone]  PCCM Pgr: [phone]

## 2017-08-01 NOTE — Progress Notes (Signed)
eLink Physician-Brief Progress Note Patient Name: Katherine Cooper DOB: November 22, 1941 MRN: [id number]   Date of Service  08/01/2017  HPI/Events of Note  GI bleeding.  Remains hypotensive.  eICU Interventions  Will transfuse 1 unit PRBC.     Intervention Category Major Interventions: Other:  Marye Eagen 08/01/2017, 4:25 AM

## 2017-08-01 NOTE — Progress Notes (Signed)
Pharmacy Antibiotic Note  Katherine Cooper is a 76 y.o. female admitted from [facility] on 08/[facility]/2017 with AMS/ARF/sepsis.  Pharmacy has been consulted for further vancomycin dosing following BCID 1/2 positive blood cultures for MRSA. Patient received one prior dose of vancomycin and zosyn on 08/23/2017.  Patient is also currently on meropenem. Will continue to monitor for results from further cultures.  Plan: Vancomycin 1000mg  IV q 48hr  Height: 5\' 3"  (160 cm) Weight: 170 lb 3.1 oz (77.2 kg) IBW/kg (Calculated) : 52.4  Temp (24hrs), Avg:97.9 F (36.6 C), Min:97.3 F (36.3 C), Max:98.6 F (37 C)   Recent Labs Lab 08/21/2017 1721 08/04/2017 1730 08/17/2017 1731 08/07/2017 2146 08/01/17 0100 08/01/17 0145 08/01/17 0400  WBC 18.7*  --   --   --   --  18.8*  --   CREATININE 4.83* 4.80*  --   --   --  3.98*  --   LATICACIDVEN  --   --  2.10* 3.5* 2.5*  --  4.1*    Estimated Creatinine Clearance: 11.8 mL/min (A) (by C-G formula based on SCr of 3.98 mg/dL (H)).    Allergies  Allergen Reactions  . Sulfa Antibiotics Itching   Antibiotics:  8/7 Meropenem>> Zosyn 8/7 >> 8/7  Vancomycin 8/7 x 1 dose  Vancomycin 8/8>>  Cultures:  8/8 MRSA PCR: positive 8/8 UCx:  8/8 BCx x2: MRSA   Jalene Mullet, Pharm.D. PGY1 Pharmacy Resident 08/01/2017 1:55 PM Main Pharmacy: [phone]

## 2017-08-01 NOTE — Progress Notes (Signed)
CRITICAL VALUE ALERT  Critical Value:  Lactic acid 4.1  Date & Time Notied:  08/01/17 0410  Provider Notified: Dr. Halford Chessman  Orders Received/Actions taken: No new orders at this time

## 2017-08-01 NOTE — Progress Notes (Signed)
PHARMACY - PHYSICIAN COMMUNICATION CRITICAL VALUE ALERT - BLOOD CULTURE IDENTIFICATION (BCID)  Results for orders placed or performed during the hospital encounter of 08/02/2017  Blood Culture ID Panel (Reflexed) (Collected: 08/14/2017  6:19 PM)  Result Value Ref Range   Enterococcus species NOT DETECTED NOT DETECTED   Listeria monocytogenes NOT DETECTED NOT DETECTED   Staphylococcus species DETECTED (A) NOT DETECTED   Staphylococcus aureus DETECTED (A) NOT DETECTED   Methicillin resistance DETECTED (A) NOT DETECTED   Streptococcus species NOT DETECTED NOT DETECTED   Streptococcus agalactiae NOT DETECTED NOT DETECTED   Streptococcus pneumoniae NOT DETECTED NOT DETECTED   Streptococcus pyogenes NOT DETECTED NOT DETECTED   Acinetobacter baumannii NOT DETECTED NOT DETECTED   Enterobacteriaceae species NOT DETECTED NOT DETECTED   Enterobacter cloacae complex NOT DETECTED NOT DETECTED   Escherichia coli NOT DETECTED NOT DETECTED   Klebsiella oxytoca NOT DETECTED NOT DETECTED   Klebsiella pneumoniae NOT DETECTED NOT DETECTED   Proteus species NOT DETECTED NOT DETECTED   Serratia marcescens NOT DETECTED NOT DETECTED   Haemophilus influenzae NOT DETECTED NOT DETECTED   Neisseria meningitidis NOT DETECTED NOT DETECTED   Pseudomonas aeruginosa NOT DETECTED NOT DETECTED   Candida albicans NOT DETECTED NOT DETECTED   Candida glabrata NOT DETECTED NOT DETECTED   Candida krusei NOT DETECTED NOT DETECTED   Candida parapsilosis NOT DETECTED NOT DETECTED   Candida tropicalis NOT DETECTED NOT DETECTED   Gm positive cocci in 1/2 bottles. MRSA.   Name of physician (or Provider) Contacted: CCM team/ ID   Changes to prescribed antibiotics required: Patient currently on vancomycin which will cover  Susa Raring, PharmD PGY2 Infectious Diseases Pharmacy Resident Pager: [phone] 08/01/2017  10:44 AM

## 2017-08-01 NOTE — Progress Notes (Signed)
Pt disimpacted and warm water enema given per MD order.

## 2017-08-01 NOTE — Progress Notes (Signed)
Pharmacy Antibiotic Note  Katherine Cooper is a 76 y.o. female admitted from [facility] on 08/16/2017 with AMS/ARF/sepsis.  Pharmacy has been consulted for Vancomycin and Meropenem dosing.  Plan: Meropenem 1 g IV q24h Vancomycin 1250 mg x 1, f/u renal function for further doses  Weight: 170 lb 3.1 oz (77.2 kg)  Temp (24hrs), Avg:98.3 F (36.8 C), Min:97.9 F (36.6 C), Max:98.6 F (37 C)   Recent Labs Lab 08/12/2017 1721 08/08/2017 1730 08/17/2017 1731 08/05/2017 2146  WBC 18.7*  --   --   --   CREATININE 4.83* 4.80*  --   --   LATICACIDVEN  --   --  2.10* 3.5*    Estimated Creatinine Clearance: 10.2 mL/min (A) (by C-G formula based on SCr of 4.8 mg/dL (H)).    Allergies  Allergen Reactions  . Sulfa Antibiotics Itching    Caryl Pina 08/01/2017 12:25 AM

## 2017-08-01 NOTE — Progress Notes (Signed)
[facility] Progress Note Patient Name: Sherriann Szuch DOB: 05/10/1941 MRN: [id number]   Date of Service  08/01/2017  HPI/Events of Note  Persistent hypotension.  Admitted with septic shock.  On pressors.  RN notes bleeding, but unclear where source is.   eICU Interventions  Will give 1 liter NS bolus, add solu cortef pending cortisol level, repeat Hb/Hct now, and hold heparin SQ.  Will ask bedside team to assess location of bleeding.      Intervention Category Major Interventions: Other:  Jaria Conway 08/01/2017, 1:44 AM

## 2017-08-01 NOTE — Progress Notes (Signed)
PULMONARY / CRITICAL CARE MEDICINE   Name: Katherine Cooper MRN: [id number] DOB: 1941-10-28    ADMISSION DATE:  07/25/2017 CONSULTATION DATE:  07/26/2017  REFERRING MD:  Dr. Ralene Bathe   CHIEF COMPLAINT:  Sepsis   HISTORY OF PRESENT ILLNESS:  76 y.o. female with PMH of Alzheimer dementia, Anemia, Breast CA, CKD, GERD, HTN, Hypothyroid, CVA. Presents to ED on 8/7 with progressive lethargy. Family reports that patient has been at [facility] since February 2018 and since has been bed bound however, alert and verbal and confused. Patient has stage 3 sacral pressure ulcer (which looks clean). For the last few days reportedly has been non-verbal and moaning. Upon arrival patient was hypoglycemic (glucose 49) and hypotensive (84/69). WBC 18.7, LA 2.10, administered 3L NS and started on Levophed gtt. CT head negative for acute process. PCCM asked to admit.   SUBJECTIVE:  Patient remains altered & on vasopressors with ongoing shock.   REVIEW OF SYSTEMS:  Unable to obtain with encephalopathy.   VITAL SIGNS: BP (!) 89/64   Pulse (!) 112   Temp 98.3 F (36.8 C) (Oral)   Resp 15   Ht 5\' 3"  (1.6 m)   Wt 170 lb 3.1 oz (77.2 kg)   SpO2 99%   BMI 30.15 kg/m   HEMODYNAMICS: CVP:  [68 mmHg] 68 mmHg  VENTILATOR SETTINGS:    INTAKE / OUTPUT: I/O last 3 completed shifts: In: 1898.1 [I.V.:1848.1; IV Piggyback:50] Out: -   PHYSICAL EXAMINATION: General:  No acute distress. Multiple family at bedside.  Integument:  Stage III sacral decubitus noted by admitting physician team. Warm. Dry. Extremities:  No cyanosis or clubbing. Cool.  HEENT:  Moist mucus membranesNo scleral injection or icterus.  Cardiovascular:  Regular rate. No appreciable JVD.  Pulmonary:  Normal work of breathing. Slightly diminished breath sounds in bases. Abdomen: Soft. Normal bowel sounds. Protuberant. Neurological:  Opens eyes to voice. Doesn't follow commands or speak.   LABS:  BMET  Recent Labs Lab 07/25/2017 1721 08/01/2017 1730  08/01/17 0145  NA 136 138 138  K 5.7* 4.8 4.6  CL 112* 114* 117*  CO2 12*  --  10*  BUN 74* 81* 63*  CREATININE 4.83* 4.80* 3.98*  GLUCOSE 202* 195* 141*    Electrolytes  Recent Labs Lab 08/16/2017 1721 08/01/17 0145  CALCIUM 9.2 9.0  MG  --  1.7  PHOS  --  3.5    CBC  Recent Labs Lab 08/07/2017 1721 08/01/2017 1730 08/01/17 0145 08/01/17 0959  WBC 18.7*  --  18.8*  --   HGB 7.7* 8.8* 7.6* 9.2*  HCT 24.0* 26.0* 24.9* 28.5*  PLT 302  --  275  --     Coag's  Recent Labs Lab 08/01/17 0429  INR 2.14    Sepsis Markers  Recent Labs Lab 08/11/2017 2146 08/01/17 0100 08/01/17 0145 08/01/17 0400  LATICACIDVEN 3.5* 2.5*  --  4.1*  PROCALCITON  --   --  13.42  --     ABG  Recent Labs Lab 08/22/2017 2102 08/01/17 0808  PHART 7.293* 7.301*  PCO2ART 24.4* 22.2*  PO2ART 268.0* 101    Liver Enzymes  Recent Labs Lab 08/08/2017 1721  AST 52*  ALT 60*  ALKPHOS 165*  BILITOT 1.8*  ALBUMIN 1.4*    Cardiac Enzymes No results for input(s): TROPONINI, PROBNP in the last 168 hours.  Glucose  Recent Labs Lab 08/24/2017 1703 07/27/2017 1732 08/01/17 0023 08/01/17 0419 08/01/17 0724 08/01/17 1117  GLUCAP 51* 134* 89 165* 128*  138*    Imaging Ct Head Wo Contrast  Result Date: 07/26/2017 CLINICAL DATA:  Acute mental status changes. EXAM: CT HEAD WITHOUT CONTRAST TECHNIQUE: Contiguous axial images were obtained from the base of the skull through the vertex without intravenous contrast. COMPARISON:  05/14/2017 FINDINGS: Brain: Stable atrophy and mild underlying periventricular small vessel disease. The brain demonstrates no evidence of hemorrhage, acute infarction, edema, mass effect, extra-axial fluid collection, hydrocephalus or mass lesion. Vascular: No hyperdense vessel or unexpected calcification. Skull: Normal. Negative for fracture or focal lesion. Sinuses/Orbits: No acute finding. Other: None. IMPRESSION: No acute findings.  Stable mild atrophy and small  vessel disease. Electronically Signed   By: Aletta Edouard M.D.   On: 08/14/2017 19:55   US Renal  Result Date: 08/01/2017 CLINICAL DATA:  Renal failure. EXAM: RENAL / URINARY TRACT ULTRASOUND COMPLETE COMPARISON:  02/06/2015 FINDINGS: Right Kidney: Length: 10.2 cm. Echogenicity within normal limits. Mild cortical thinning. No mass or hydronephrosis visualized. Left Kidney: Length: 9.2 cm. Mildly increased cortical echogenicity and mild cortical atrophy. No mass or hydronephrosis visualized. Bladder: Decompressed. IMPRESSION: Both kidneys demonstrate mild cortical thinning. No hydronephrosis identified. Electronically Signed   By: Aletta Edouard M.D.   On: 08/01/2017 00:08   Dg Chest Portable 1 View  Result Date: 08/01/2017 CLINICAL DATA:  Confirm central line position EXAM: PORTABLE CHEST 1 VIEW COMPARISON:  08/16/2017 CT 1550 hours FINDINGS: Left IJ central line catheter tip crosses midline and is seen in the expected location of the distal left brachiocephalic vein -SVC juncture. Heart and mediastinal contours are stable with tortuous ectatic thoracic aorta with atherosclerosis. Lungs are clear without pneumothorax. IMPRESSION: 1. Left IJ central line catheter crosses midline to the expected location of the distal left brachiocephalic vein - SVC junction. 2. No pneumothorax noted. 3. Aortic atherosclerosis. Electronically Signed   By: Ashley Royalty M.D.   On: 08/01/2017 00:29   Dg Chest Port 1 View  Result Date: 08/21/2017 CLINICAL DATA:  Mental status changes. EXAM: PORTABLE CHEST 1 VIEW COMPARISON:  05/14/2017 FINDINGS: The heart size is within normal limits. There is stable prominence and tortuosity of the thoracic aorta. Lung volumes are low bilaterally. There is no evidence of pulmonary edema, consolidation, pneumothorax, nodule or pleural fluid. IMPRESSION: No active disease. Electronically Signed   By: Aletta Edouard M.D.   On: 08/15/2017 16:04   Ct Renal Stone Study  Result Date:  08/16/2017 CLINICAL DATA:  Patient with abdominal pain. EXAM: CT ABDOMEN AND PELVIS WITHOUT CONTRAST TECHNIQUE: Multidetector CT imaging of the abdomen and pelvis was performed following the standard protocol without IV contrast. COMPARISON:  None. FINDINGS: Lower chest: Normal heart size. Minimal atelectasis within the lung bases. Moderate size hiatal hernia. Hepatobiliary: Liver is diffusely low in attenuation compatible with steatosis. High attenuation within the gallbladder lumen most compatible with sludge/stones. No surrounding inflammatory change. No intrahepatic or extrahepatic biliary ductal dilatation. Pancreas: Unremarkable.  No surrounding inflammatory change. Spleen: Unremarkable Adrenals/Urinary Tract: The adrenal glands are normal in appearance. Kidneys are symmetric in size. No hydronephrosis. Urinary bladder is decompressed with a Foley catheter. Stomach/Bowel: Large amount of stool within the rectum. Normal appendix. No evidence for small bowel obstruction. No free fluid or free intraperitoneal air. Vascular/Lymphatic: Normal caliber abdominal aorta. Peripheral calcified atherosclerotic plaque. No retroperitoneal lymphadenopathy. Reproductive: Status post hysterectomy. Other: None. Musculoskeletal: No aggressive or acute appearing osseous lesions. Lumbar spine degenerative changes. Similar-appearing sclerotic lesions within the left ilium suggestive of bone islands. IMPRESSION: Large amount of stool within the rectum  as can be seen with impaction. Moderate hiatal hernia. Hepatic steatosis. High attenuation within the gallbladder lumen most suggestive of stones/sludge. Electronically Signed   By: Lovey Newcomer M.D.   On: 08/04/2017 20:16     STUDIES:  CT HEAD W/O 8/7:  No acute findings.  Stable mild atrophy and small vessel disease. CT RENAL STONE STUDY 8/7:  Large amount of stool within the rectum as can be seen with impaction. Moderate hiatal hernia. Hepatic steatosis. High attenuation within  the gallbladder lumen most suggestive of stones/sludge. RENAL U/S 8/7:  Both kidneys demonstrate mild cortical thinning. No hydronephrosis identified. PORT CXR 8/8:  Personally reviewed by me. Left internal jugular central venous catheter in good position. No parenchymal opacity or mass appreciated. No pleural effusion.  MICROBIOLOGY: MRSA PCR 8/8:  Positive Blood Cultures x2 8/7 >>> 1/2 Bottles Positive MRSA Urine Ctx 8/7 >>>  ANTIBIOTICS: Zosyn 8/7 (x1 dose) Meropenem 8/7 >>> Vancomycin 8/7 >>>  SIGNIFICANT EVENTS: 8/07 - Admit   LINES/TUBES: L IJ CVL 8/7 >>> R RAD ART LINE 8/8 >>> FOLEY >>>  ASSESSMENT / PLAN:  PULMONARY A: Acute hypoxic respiratory failure: Likely secondary to atelectasis.  P:   Weaning FiO2 Close monitoring given risk of intubation Continuous pulse oximetry monitoring  CARDIOVASCULAR A:  Shock: Secondary to sepsis. History of essential hypertension  P:  Weaning vasopressors for MAP >65 & SBP >90 Continue telemetry monitoring Vital signs per unit protocol Echocardiogram pending  RENAL A:   Acute on chronic renal failure stage IV Metabolic/lactic acidosis  P:   Trending UOP with Foley Monitoring electrolytes & renal function closesly Trending Lactic Acid Bicarb drip at 75cc/hr  Repeat ABG at 5pm & again in AM  GASTROINTESTINAL A:   GIB Impacted Stool:  S/P disimpaction.  Failure to Thrive GERD  P:   NPO Protonix IV q12hr GI Consulted  HEMATOLOGIC A:   Anemia:  Multifactorial from blood loss & chronic disease. Coagulopathy:  S/P FFP & Vitamin K 5mg  IV. Leukocytosis:  Secondary to sepsis.   P:  Trending cell counts q12hr SCDs Repeat INR in AM  INFECTIOUS A:   MRSA Bacteremia Sepsis:  H/O ESBL Klebsiella UTI (04/2017) and MRSA bacteremia (04/2017). Possible Osteomyelitis:  Stage 3 sacral decub.   P:   Awaiting finalization of cultures ID following Empiric Vancomycin & Merrem Trending Procalcitonin per algorithm    ENDOCRINE A:   Hypoglycemia:  Improved.    P:   Accu-Checks q4hr w/ MD notification parameters. D/C Solu-Cortef  NEUROLOGIC A:   Acute Encephalopathy:  Likely due to toxic metabolic. H/O Alzheimer's Dementia   P:   Avoiding sedating medications.    FAMILY  - Updates:  multiple family members and updated at bedside at the time of my rounds.  - Inter-disciplinary family meet or Palliative Care meeting due by: 08/07/2017  DISCUSSION:  76 y.o. Female admitted with septic shock and multisystem organ failure. Acute encephalopathy on top of baseline Alzheimer's dementia. Explained prognosis is poor to family members. Appreciate assistance from ID.  I have spent an additional total of 36 minutes of critical care time today caring for the patient, updating her family, and reviewing the patient's electronic medical record.   Sonia Baller Ashok Cordia, M.D. [facility] Pager:  [phone] After 3pm or if no response, call [phone] 1:40 PM 08/01/17

## 2017-08-01 NOTE — Consult Note (Signed)
[facility] Consult: 9:17 AM 08/01/2017  LOS: 1 day    Referring Provider: Dr Jimmey Ralph  Primary Care Physician:  Nolene Ebbs, MD Primary Gastroenterologist:  Althia Forts.  Not seeing any Epic evidence of previous GI involvement.   Reason for Consultation:  Bleeding per rectum   HPI: Katherine Cooper is a 76 y.o. female.  Bedbound SNF pt.  At best she is alert, verbal but confused.  PMH dementia.  Anemia (Hgb 7.1 02/09/17, did not receive blood transfusion at that time.).  Breast cancer.  Obesity.  GERD.  Sacral, heal, auricular decubitus ulcers.  Constipation, stool in rectum, noted on KUB of 05/19/17.  DM 2.  CKD.  Hx CVA.  S/P hysterectomy.  In last few days, decline in MS and lethargy noted.  Developed bleeding PR.  Details of this as well as of her bowel habits are unclear.   Med list from SNF includes PRN Proctofoam and Preparation H for "hemorrhoids". Brought to ED.   WBCs 18 K, elevated lactate still rising, hypotensive. AKI.  UTI with sepsis Lipase of 79 with minor bump LFTs. Hemoglobin in late 04/2017 was 9.8. At arrival yesterday it was 7.7 and is 7.6 today.  Platelets ok.   PT/INR elevated 24/2.1.  FFP ordered.   On renal stone protocol CT scan large amount of stool seen within the rectum, possibly representing impaction. Fatty liver. Moderate hiatal hernia. Sludge versus stones within the gallbladder. She is full code per family wishes and receiving bicarbonate, vasopressin, Levophed drips and meropenem for urinary sepsis.  Patient had rectal impaction removed digitally and produced some stool after this as well as some blood per rectum. Both stools and bleeding have resolved. There is no obvious abdominal pain. There's been no nausea or vomiting.     Past Medical History:  Diagnosis Date  .  Alzheimer disease   . Alzheimer's dementia, late onset, with behavioral disturbance 02/06/2017  . Anemia   . Angina   . Anxiety   . Arthritis   . Blood transfusion   . Breast cancer ([place]) 10/22/2012  . CAP (community acquired pneumonia)   . Chronic kidney disease   . DDD (degenerative disc disease), lumbar   . Dementia   . Dysrhythmia   . GERD without esophagitis 05/28/2017  . Gout   . Headache(784.0)   . Heart murmur   . Hypertension   . Hypothyroidism   . Obesity   . Recurrent upper respiratory infection (URI)   . Shortness of breath 05/26/2013  . Stroke [facility])     Past Surgical History:  Procedure Laterality Date  . UTERINE FIBROID SURGERY      Prior to Admission medications   Medication Sig Start Date End Date Taking? Authorizing Provider  allopurinol (ZYLOPRIM) 100 MG tablet Take 100 mg by mouth daily. 07/24/17  Yes [provider]  alum & mag hydroxide-simeth (MYLANTA) 200-200-20 MG/5ML suspension Take 30 mLs by mouth every 6 (six) hours as needed for indigestion or heartburn.   Yes [provider]  aspirin EC 81 MG tablet Take 81  mg by mouth daily.   Yes [provider]  cetirizine (ZYRTEC) 10 MG tablet Take 10 mg by mouth daily.    Yes [provider]  colchicine 0.6 MG tablet Take 0.6 mg by mouth daily. 07/23/17  Yes [provider]  donepezil (ARICEPT) 10 MG tablet Take 10 mg by mouth at bedtime.    Yes [provider]  fluticasone (FLONASE) 50 MCG/ACT nasal spray Place 2 sprays into both nostrils daily.    Yes [provider]  HYDROcodone-acetaminophen (NORCO/VICODIN) 5-325 MG tablet Take 1 tablet by mouth every 8 (eight) hours as needed for moderate pain or severe pain. 05/24/17  Yes Hongalgi, Lenis Dickinson, MD  loratadine (CLARITIN) 10 MG tablet Take 10 mg by mouth daily.   Yes [provider]  Memantine HCl ER (NAMENDA XR) 28 MG CP24 Take 1 capsule by mouth every morning.    Yes [provider]    mirtazapine (REMERON) 15 MG tablet Take 15 mg by mouth at bedtime.   Yes [provider]  Multiple Vitamin (MULTIVITAMIN WITH MINERALS) TABS tablet Take 1 tablet by mouth daily.   Yes [provider]  Multiple Vitamins-Minerals (DECUBI-VITE) CAPS Take 1 capsule by mouth daily. Wound healing   Yes [provider]  NUTRITIONAL SUPPLEMENT LIQD Give 240cc three times a day between meals   Yes [provider]  ondansetron (ZOFRAN) 4 MG tablet Take 4 mg by mouth every 6 (six) hours as needed for nausea or vomiting.   Yes [provider]  oxybutynin (DITROPAN-XL) 5 MG 24 hr tablet Take 5 mg by mouth daily. 02/02/17  Yes [provider]  pantoprazole (PROTONIX) 40 MG tablet Take 40 mg by mouth daily.    Yes [provider]  phenylephrine-shark liver oil-mineral oil-petrolatum (PREPARATION H) 0.25-3-14-71.9 % rectal ointment Place 1 application rectally 2 (two) times daily as needed for hemorrhoids.   Yes [provider]  potassium chloride (KLOR-CON) 20 MEQ packet Take 20 mEq by mouth daily.   Yes [provider]  pramoxine (PROCTOFOAM) 1 % foam Place 1 application rectally every 6 (six) hours as needed for hemorrhoids.   Yes [provider]  senna-docusate (SENOKOT-S) 8.6-50 MG tablet Take 2 tablets by mouth at bedtime.   Yes [provider]  simvastatin (ZOCOR) 10 MG tablet Take 10 mg by mouth at bedtime.   Yes [provider]    Scheduled Meds: . chlorhexidine  15 mL Mouth Rinse BID  . hydrocortisone sod succinate (SOLU-CORTEF) inj  50 mg Intravenous Q6H  . mouth rinse  15 mL Mouth Rinse q12n4p   Infusions: . sodium chloride    . sodium chloride    . sodium chloride    . sodium chloride    . meropenem (MERREM) IV    . norepinephrine (LEVOPHED) Adult infusion 40 mcg/min (08/01/17 0800)  .  sodium bicarbonate (isotonic) infusion in sterile water 75 mL/hr at 08/01/17 0800  . vasopressin  (PITRESSIN) infusion - *FOR SHOCK* 0.03 Units/min (08/01/17 0800)   PRN Meds: sodium chloride, Place/Maintain arterial line **AND** sodium chloride   Allergies as of 07/30/2017 - Review Complete 08/01/2017  Allergen Reaction Noted  . Sulfa antibiotics Itching 11/12/2011    Family History  Problem Relation Age of Onset  . Asthma Mother   . Heart attack Father   . Kidney failure Brother     Social History   Social History  . Marital status: Widowed    Spouse name: N/A  .  Number of children: N/A  . Years of education: N/A   Occupational History  . Not on file.   Social History Main Topics  . Smoking status: Former Smoker    Packs/day: 0.25    Types: Cigarettes    Quit date: 03/13/2012  . Smokeless tobacco: Never Used  . Alcohol use No  . Drug use: No  . Sexual activity: No   Other Topics Concern  . Not on file   Social History Narrative  . No narrative on file    REVIEW OF SYSTEMS: Unable to obtain the review of system in this nonverbal patient   PHYSICAL EXAM: Vital signs in last 24 hours: Vitals:   08/01/17 0800 08/01/17 0815  BP:    Pulse: (!) 102   Resp: 15 15  Temp:     Wt Readings from Last 3 Encounters:  07/26/2017 77.2 kg (170 lb 3.1 oz)  05/29/17 77.2 kg (170 lb 3.2 oz)  05/28/17 74.6 kg (164 lb 6 oz)    General: Obese, chronically ill, aged, frail AAF. She is moaning. Head:  No signs of head trauma or facial asymmetry.  Eyes:  No scleral icterus. No conjunctival pallor. Ears:  Unable to assess hearing but she does not respond to her name or commands. Dressing noted on right ear Nose:  No congestion or discharge Mouth:  Will not open mouth for exam and did not pry open her mouth. From the external appearance of her jaw she looks to be edentulous. There is no blood coming from the mouth or dried blood. Neck:  No masses, no thyromegaly, no JVD. Lungs:  Breathing is not labored. She is not coughing. Lungs in front are clear. Heart: Regular.  Tachycardia into the 1 twenties. No MRG. Abdomen:  Obese, soft. Not tender. Bowel sounds normoactive. No HSM or masses. No obvious hernias..   Rectal: Deferred rectal exam as staff just completed a wash down and complete change of linens. but according to the nurse there is been no blood for several hours.   Musc/Skeltl: No obvious joint swelling or deformity. Extremities:  Feet are cool. 1+ edema of the lower legs and feet.  Neurologic:  Moaning. Not responding to commands or voice. No meaningful speech. Moves all 4 limbs, strength not tested. Skin:  Early stage ulcers on the heels. Bandaged ulcers on the year. Ulcers on the sacrum not examined. Tattoos:  None Nodes:  No cervical adenopathy   Psych:  Unable to assess in this demented and septic patient  Intake/Output from previous day: 08/07 0701 - 08/08 0700 In: 1898.1 [I.V.:1848.1; IV Piggyback:50] Out: -  Intake/Output this shift: Total I/O In: 686.8 [I.V.:133.8; Blood:553] Out: 20 [Urine:20]  LAB RESULTS:  Recent Labs  08/07/2017 1721 08/18/2017 1730 08/01/17 0145  WBC 18.7*  --  18.8*  HGB 7.7* 8.8* 7.6*  HCT 24.0* 26.0* 24.9*  PLT 302  --  275   BMET Lab Results  Component Value Date   NA 138 08/01/2017   NA 138 08/15/2017   NA 136 08/05/2017   K 4.6 08/01/2017   K 4.8 08/18/2017   K 5.7 (H) 08/01/2017   CL 117 (H) 08/01/2017   CL 114 (H) 08/19/2017   CL 112 (H) 08/13/2017   CO2 10 (L) 08/01/2017   CO2 12 (L) 07/28/2017   CO2 23 05/22/2017   GLUCOSE 141 (H) 08/01/2017   GLUCOSE 195 (H) 07/30/2017   GLUCOSE 202 (H) 07/28/2017   BUN 63 (H) 08/01/2017   BUN  81 (H) 08/24/2017   BUN 74 (H) 08/03/2017   CREATININE 3.98 (H) 08/01/2017   CREATININE 4.80 (H) 07/29/2017   CREATININE 4.83 (H) 08/03/2017   CALCIUM 9.0 08/01/2017   CALCIUM 9.2 08/08/2017   CALCIUM 9.8 05/22/2017   LFT  Recent Labs  08/22/2017 1721  PROT 4.2*  ALBUMIN 1.4*  AST 52*  ALT 60*  ALKPHOS 165*  BILITOT 1.8*   PT/INR Lab Results    Component Value Date   INR 2.14 08/01/2017   INR 1.02 11/12/2011   INR 1.1 07/02/2009   Lipase     Component Value Date/Time   LIPASE 79 (H) 08/01/2017 0145        RADIOLOGY STUDIES: Ct Head Wo Contrast  Result Date: 07/25/2017 CLINICAL DATA:  Acute mental status changes. EXAM: CT HEAD WITHOUT CONTRAST TECHNIQUE: Contiguous axial images were obtained from the base of the skull through the vertex without intravenous contrast. COMPARISON:  05/14/2017 FINDINGS: Brain: Stable atrophy and mild underlying periventricular small vessel disease. The brain demonstrates no evidence of hemorrhage, acute infarction, edema, mass effect, extra-axial fluid collection, hydrocephalus or mass lesion. Vascular: No hyperdense vessel or unexpected calcification. Skull: Normal. Negative for fracture or focal lesion. Sinuses/Orbits: No acute finding. Other: None. IMPRESSION: No acute findings.  Stable mild atrophy and small vessel disease. Electronically Signed   By: Aletta Edouard M.D.   On: 08/22/2017 19:55   US Renal  Result Date: 08/01/2017 CLINICAL DATA:  Renal failure. EXAM: RENAL / URINARY TRACT ULTRASOUND COMPLETE COMPARISON:  02/06/2015 FINDINGS: Right Kidney: Length: 10.2 cm. Echogenicity within normal limits. Mild cortical thinning. No mass or hydronephrosis visualized. Left Kidney: Length: 9.2 cm. Mildly increased cortical echogenicity and mild cortical atrophy. No mass or hydronephrosis visualized. Bladder: Decompressed. IMPRESSION: Both kidneys demonstrate mild cortical thinning. No hydronephrosis identified. Electronically Signed   By: Aletta Edouard M.D.   On: 08/01/2017 00:08   Dg Chest Portable 1 View  Result Date: 08/01/2017 CLINICAL DATA:  Confirm central line position EXAM: PORTABLE CHEST 1 VIEW COMPARISON:  08/21/2017 CT 1550 hours FINDINGS: Left IJ central line catheter tip crosses midline and is seen in the expected location of the distal left brachiocephalic vein -SVC juncture. Heart and  mediastinal contours are stable with tortuous ectatic thoracic aorta with atherosclerosis. Lungs are clear without pneumothorax. IMPRESSION: 1. Left IJ central line catheter crosses midline to the expected location of the distal left brachiocephalic vein - SVC junction. 2. No pneumothorax noted. 3. Aortic atherosclerosis. Electronically Signed   By: Ashley Royalty M.D.   On: 08/01/2017 00:29   Dg Chest Port 1 View  Result Date: 07/27/2017 CLINICAL DATA:  Mental status changes. EXAM: PORTABLE CHEST 1 VIEW COMPARISON:  05/14/2017 FINDINGS: The heart size is within normal limits. There is stable prominence and tortuosity of the thoracic aorta. Lung volumes are low bilaterally. There is no evidence of pulmonary edema, consolidation, pneumothorax, nodule or pleural fluid. IMPRESSION: No active disease. Electronically Signed   By: Aletta Edouard M.D.   On: 08/17/2017 16:04   Ct Renal Stone Study  Result Date: 08/07/2017 CLINICAL DATA:  Patient with abdominal pain. EXAM: CT ABDOMEN AND PELVIS WITHOUT CONTRAST TECHNIQUE: Multidetector CT imaging of the abdomen and pelvis was performed following the standard protocol without IV contrast. COMPARISON:  None. FINDINGS: Lower chest: Normal heart size. Minimal atelectasis within the lung bases. Moderate size hiatal hernia. Hepatobiliary: Liver is diffusely low in attenuation compatible with steatosis. High attenuation within the gallbladder lumen most compatible with sludge/stones. No  surrounding inflammatory change. No intrahepatic or extrahepatic biliary ductal dilatation. Pancreas: Unremarkable.  No surrounding inflammatory change. Spleen: Unremarkable Adrenals/Urinary Tract: The adrenal glands are normal in appearance. Kidneys are symmetric in size. No hydronephrosis. Urinary bladder is decompressed with a Foley catheter. Stomach/Bowel: Large amount of stool within the rectum. Normal appendix. No evidence for small bowel obstruction. No free fluid or free intraperitoneal  air. Vascular/Lymphatic: Normal caliber abdominal aorta. Peripheral calcified atherosclerotic plaque. No retroperitoneal lymphadenopathy. Reproductive: Status post hysterectomy. Other: None. Musculoskeletal: No aggressive or acute appearing osseous lesions. Lumbar spine degenerative changes. Similar-appearing sclerotic lesions within the left ilium suggestive of bone islands. IMPRESSION: Large amount of stool within the rectum as can be seen with impaction. Moderate hiatal hernia. Hepatic steatosis. High attenuation within the gallbladder lumen most suggestive of stones/sludge. Electronically Signed   By: Lovey Newcomer M.D.   On: 07/30/2017 20:16    ENDOSCOPIC STUDIES: None found in Epic or in any of her records.  IMPRESSION:   *  Bleeding per rectum in setting of fecal impaction. Possibly has to coral ulcer versus hemorrhoidal bleeding. Bleeding volume not large and no diverticula detected per CT so diverticular bleed less likely.  *  Anemia. Acute on chronic.  Normocytic.  1 PRBC ordered.    *  Coagulopathy. 1 FFP ordered.   *  Advanced dementia, bedbound patient.  *  Urosepsis.  *  Multiple decubitus ulcers. Particularly having a decubitus ulcer on the ear is a significant negative prognostic indicator and telling as to this patient's poor baseline health status  *  Minor elevation of LFTs and Lipase.  GB sludge vs stones on CT.     PLAN:     *  Add miralax daily once taking PO, in meantime Dulcolax PR daily, start tomorrow.    *  If the family is open to it, a palliative goals of care consult would be entirely appropriate.  My understanding is that their vision for this patient's future is unrealistic.  *  Vitamin K IV x 3 days, coags in Am along with CBC.      Azucena Freed  08/01/2017, 9:17 AM Pager: [phone]      [facility] Attending   I have taken an interval history, reviewed the chart and examined the patient. I agree with the Advanced Practitioner's note, impression  and recommendations.    No GI bleeding seen after RN's have disimpacted patient. Signing off  Gatha Mayer, MD, [facility] [phone] (pager) 08/01/2017 7:11 PM

## 2017-08-01 NOTE — Procedures (Signed)
Arterial Catheter Insertion Procedure Note Katherine Cooper [id number] 06-13-1941  Procedure: Insertion of Arterial Catheter  Indications: Frequent blood sampling  Procedure Details Consent: Risks of procedure as well as the alternatives and risks of each were explained to the (patient/caregiver).  Consent for procedure obtained. Time Out: Verified patient identification, verified procedure, site/side was marked, verified correct patient position, special equipment/implants available, medications/allergies/relevent history reviewed, required imaging and test results available.  Performed  Maximum sterile technique was used including antiseptics, cap, gloves, gown, hand hygiene, mask and sheet. Skin prep: Chlorhexidine; local anesthetic administered 20 gauge catheter was inserted into right radial artery using the Seldinger technique.  Evaluation Blood flow good; BP tracing good. Complications: No apparent complications.   Katherine Cooper 08/01/2017

## 2017-08-02 ENCOUNTER — Inpatient Hospital Stay (HOSPITAL_COMMUNITY): Payer: Medicare Other

## 2017-08-02 DIAGNOSIS — R6521 Severe sepsis with septic shock: Secondary | ICD-10-CM

## 2017-08-02 DIAGNOSIS — B451 Cerebral cryptococcosis: Secondary | ICD-10-CM

## 2017-08-02 DIAGNOSIS — I361 Nonrheumatic tricuspid (valve) insufficiency: Secondary | ICD-10-CM

## 2017-08-02 DIAGNOSIS — A419 Sepsis, unspecified organism: Secondary | ICD-10-CM

## 2017-08-02 LAB — URINALYSIS, ROUTINE W REFLEX MICROSCOPIC
BILIRUBIN URINE: NEGATIVE
Glucose, UA: NEGATIVE mg/dL
KETONES UR: NEGATIVE mg/dL
NITRITE: NEGATIVE
PROTEIN: 30 mg/dL — AB
SPECIFIC GRAVITY, URINE: 1.016 (ref 1.005–1.030)
pH: 5 (ref 5.0–8.0)

## 2017-08-02 LAB — BPAM FFP
Blood Product Expiration Date: 201808132359
ISSUE DATE / TIME: 201808080640
Unit Type and Rh: 7300

## 2017-08-02 LAB — BLOOD GAS, ARTERIAL
Acid-base deficit: 8.4 mmol/L — ABNORMAL HIGH (ref 0.0–2.0)
BICARBONATE: 15.6 mmol/L — AB (ref 20.0–28.0)
Drawn by: 270271
FIO2: 21
O2 SAT: 97.4 %
PATIENT TEMPERATURE: 98.7
PO2 ART: 93.6 mmHg (ref 83.0–108.0)
pCO2 arterial: 26.2 mmHg — ABNORMAL LOW (ref 32.0–48.0)
pH, Arterial: 7.392 (ref 7.350–7.450)

## 2017-08-02 LAB — PREPARE FRESH FROZEN PLASMA: Unit division: 0

## 2017-08-02 LAB — ECHOCARDIOGRAM COMPLETE
AV Area VTI index: 0.66 cm2/m2
AV Area VTI: 1.77 cm2
AV Area mean vel: 1.86 cm2
AV Mean grad: 9 mmHg
AV VEL mean LVOT/AV: 0.82
AV peak Index: 0.79
AV vel: 1.47
AVAREAMEANVIN: 0.83 cm2/m2
AVLVOTPG: 12 mmHg
AVPG: 20 mmHg
AVPKVEL: 221 cm/s
Ao pk vel: 0.78 m/s
CHL CUP AV VALUE AREA INDEX: 0.66
CHL CUP DOP CALC LVOT VTI: 26.3 cm
DOP CAL AO MEAN VELOCITY: 137 cm/s
FS: 27 % — AB (ref 28–44)
HEIGHTINCHES: 63 in
IV/PV OW: 1.01
LA diam end sys: 32 mm
LA diam index: 1.43 cm/m2
LA vol A4C: 23.1 ml
LASIZE: 32 mm
LV PW d: 10.7 mm — AB (ref 0.6–1.1)
LVOT SV: 60 mL
LVOT area: 2.27 cm2
LVOT peak vel: 172 cm/s
LVOTD: 17 mm
LVOTVTI: 0.65 cm
P 1/2 time: 360 ms
RV LATERAL S' VELOCITY: 24.1 cm/s
Reg peak vel: 301 cm/s
TAPSE: 19.1 mm
TRMAXVEL: 301 cm/s
VTI: 40.6 cm
Valve area: 1.47 cm2
Weight: 4462.11 oz

## 2017-08-02 LAB — CBC WITH DIFFERENTIAL/PLATELET
Basophils Absolute: 0 10*3/uL (ref 0.0–0.1)
Basophils Relative: 0 %
EOS ABS: 0 10*3/uL (ref 0.0–0.7)
EOS PCT: 0 %
HCT: 26.9 % — ABNORMAL LOW (ref 36.0–46.0)
Hemoglobin: 8.9 g/dL — ABNORMAL LOW (ref 12.0–15.0)
LYMPHS ABS: 1.7 10*3/uL (ref 0.7–4.0)
Lymphocytes Relative: 8 %
MCH: 29.1 pg (ref 26.0–34.0)
MCHC: 33.1 g/dL (ref 30.0–36.0)
MCV: 87.9 fL (ref 78.0–100.0)
MONO ABS: 1 10*3/uL (ref 0.1–1.0)
MONOS PCT: 5 %
Neutro Abs: 17.9 10*3/uL — ABNORMAL HIGH (ref 1.7–7.7)
Neutrophils Relative %: 87 %
PLATELETS: 217 10*3/uL (ref 150–400)
RBC: 3.06 MIL/uL — ABNORMAL LOW (ref 3.87–5.11)
RDW: 18.7 % — ABNORMAL HIGH (ref 11.5–15.5)
WBC: 20.5 10*3/uL — ABNORMAL HIGH (ref 4.0–10.5)

## 2017-08-02 LAB — CBC
HCT: 26.8 % — ABNORMAL LOW (ref 36.0–46.0)
HEMOGLOBIN: 8.8 g/dL — AB (ref 12.0–15.0)
MCH: 28.8 pg (ref 26.0–34.0)
MCHC: 32.8 g/dL (ref 30.0–36.0)
MCV: 87.6 fL (ref 78.0–100.0)
Platelets: 196 10*3/uL (ref 150–400)
RBC: 3.06 MIL/uL — ABNORMAL LOW (ref 3.87–5.11)
RDW: 18.7 % — ABNORMAL HIGH (ref 11.5–15.5)
WBC: 21.3 10*3/uL — ABNORMAL HIGH (ref 4.0–10.5)

## 2017-08-02 LAB — PHOSPHORUS: Phosphorus: 3.7 mg/dL (ref 2.5–4.6)

## 2017-08-02 LAB — COMPREHENSIVE METABOLIC PANEL
ALT: 83 U/L — AB (ref 14–54)
AST: 37 U/L (ref 15–41)
Albumin: 1.5 g/dL — ABNORMAL LOW (ref 3.5–5.0)
Alkaline Phosphatase: 179 U/L — ABNORMAL HIGH (ref 38–126)
Anion gap: 13 (ref 5–15)
BUN: 58 mg/dL — AB (ref 6–20)
CHLORIDE: 106 mmol/L (ref 101–111)
CO2: 15 mmol/L — AB (ref 22–32)
CREATININE: 3.74 mg/dL — AB (ref 0.44–1.00)
Calcium: 8.4 mg/dL — ABNORMAL LOW (ref 8.9–10.3)
GFR calc Af Amer: 13 mL/min — ABNORMAL LOW (ref >60)
GFR, EST NON AFRICAN AMERICAN: 11 mL/min — AB (ref >60)
Glucose, Bld: 136 mg/dL — ABNORMAL HIGH (ref 65–99)
Potassium: 3.6 mmol/L (ref 3.5–5.1)
Sodium: 134 mmol/L — ABNORMAL LOW (ref 135–145)
Total Bilirubin: 1 mg/dL (ref 0.3–1.2)
Total Protein: 4.6 g/dL — ABNORMAL LOW (ref 6.5–8.1)

## 2017-08-02 LAB — GLUCOSE, CAPILLARY
GLUCOSE-CAPILLARY: 116 mg/dL — AB (ref 65–99)
GLUCOSE-CAPILLARY: 127 mg/dL — AB (ref 65–99)
GLUCOSE-CAPILLARY: 120 mg/dL — AB (ref 65–99)
GLUCOSE-CAPILLARY: 109 mg/dL — AB (ref 65–99)
Glucose-Capillary: 114 mg/dL — ABNORMAL HIGH (ref 65–99)

## 2017-08-02 LAB — POCT I-STAT TROPONIN I: Troponin i, poc: 0.16 ng/mL (ref 0.00–0.08)

## 2017-08-02 LAB — PROTIME-INR
INR: 1.49
PROTHROMBIN TIME: 18.2 s — AB (ref 11.4–15.2)

## 2017-08-02 LAB — URINE CULTURE

## 2017-08-02 LAB — PROCALCITONIN: PROCALCITONIN: 12.08 ng/mL

## 2017-08-02 LAB — MAGNESIUM: MAGNESIUM: 1.7 mg/dL (ref 1.7–2.4)

## 2017-08-02 MED ORDER — POTASSIUM CHLORIDE 10 MEQ/50ML IV SOLN: 10.0000 meq | INTRAVENOUS | Status: DC

## 2017-08-02 MED ORDER — SODIUM CHLORIDE 0.9 % IV SOLN
2.0000 g | Freq: Once | INTRAVENOUS | Status: DC
  Filled 2017-08-02: qty 20

## 2017-08-02 NOTE — Progress Notes (Addendum)
Error, BW

## 2017-08-02 NOTE — Progress Notes (Signed)
  Echocardiogram 2D Echocardiogram has been performed.  Chalmers Iddings T Sharine Cadle 08/02/2017, 12:09 PM

## 2017-08-02 NOTE — Progress Notes (Signed)
      INFECTIOUS DISEASE ATTENDING ADDENDUM:   Date: 08/02/2017  Patient name: N[place]leton record number: [id number]  Date of birth: 1941-01-03    This patient has been seen and discussed with the house staff. Please see the resident's note for complete details. I concur with their findings with the following additions/corrections:  Patient still on pressors. She is moaning but not responding to questions  Continue vancomycin  Repeat blood cultures sent  followup TTE results (would NOT do TEE)  Prognosis remains poor and she already has poor quality of life.   Rhina Brackett Dam 08/02/2017, 10:54 AM

## 2017-08-02 NOTE — Progress Notes (Signed)
PHARMACY - PHYSICIAN COMMUNICATION CRITICAL VALUE ALERT - BLOOD CULTURE IDENTIFICATION (BCID)   8/8 blood cultures with GPC. Cultures from 8/7 show staph aureus (BCID with MRSA).  Per lab no plans to re-run BCID.  Changes to prescribed antibiotics required: No changes needed  Hildred Laser, Pharm D 08/02/2017 4:20 PM

## 2017-08-02 NOTE — Progress Notes (Signed)
Initial Nutrition Assessment  DOCUMENTATION CODES:   Morbid obesity  INTERVENTION:   Start TF after Cortrak tube placed tomorrow:  Jevity 1.2 at 60 ml/h (1440 ml per day)  Pro-stat 30 ml TID  Provides 2028 kcal, 125 gm protein, 1166 ml free water daily  NUTRITION DIAGNOSIS:   Increased nutrient needs related to wound healing as evidenced by estimated needs.  GOAL:   Patient will meet greater than or equal to 90% of their needs  MONITOR:   TF tolerance, Skin, Labs, I & O's  REASON FOR ASSESSMENT:   Consult Enteral/tube feeding initiation and management  ASSESSMENT:   76 year old female with PMH of HTN, obesity, CKD, stroke, hypothyroidism, CAP, Alzheimer's dementia, and GERD, who was admitted on 8/7 with septic shock and multisystem organ failure.  Discussed patient in ICU rounds and with RN today. Currently on room air. Family changed DNR status, now patient is a full code.  Noted poor prognosis. Palliative Care Team has been consulted. Patient is not alert enough to take PO's at this time. Plans for Cortrak tube placement tomorrow. Received MD Consult for TF initiation and management. Unable to complete Nutrition-Focused physical exam at this time. ECG being performed in room. Labs reviewed: sodium 134 (L) CBG's: 127-120 Medications reviewed.  Diet Order:  Diet NPO time specified  Skin:  Wound (see comment) (stg III sacrum, L ear; stg II buttocks; stg I heels)  Last BM:  8/8  Height:   Ht Readings from Last 1 Encounters:  08/01/17 5\' 3"  (1.6 m)    Weight:   Wt Readings from Last 1 Encounters:  08/02/17 278 lb 14.1 oz (126.5 kg)    Ideal Body Weight:  52.3 kg  BMI:  Body mass index is 49.4 kg/m.  Estimated Nutritional Needs:   Kcal:  1900-2100  Protein:  115-130 gm  Fluid:  2.1 L  EDUCATION NEEDS:   No education needs identified at this time  Molli Barrows, [place], [place], [place] Pager [phone] After Hours Pager [phone]

## 2017-08-02 NOTE — Progress Notes (Signed)
[facility] for Infectious Disease  Date of Admission:  08/08/2017   Total days of antibiotics 3        Day 3 Vancomycin        Zosyn stopped 8/7        Meropenem stopped 8/8         ASSESSMENT and PLAN: MRSA Bacteremia Septic Shock  The patient was found to have MRSA per biofire results 8/8. Decubitus ulcer on patient's back is the likely source of her infection.  -Blood culture collected 8/7 shows staph aureus in 1/2 plates, pending susceptibilities -Repeat blood culture collected 8/8 pending -Urine culture shows multiple species, recollect -Continue vancomycin             -Vancomycin dosing and trough per pharmacy assistance  -consider palliative care consult to discuss goals of care  -Central line is present which has risk of being infected. Cath holiday recommended as able -TTE today 8/9 pending result    . chlorhexidine  15 mL Mouth Rinse BID  . Chlorhexidine Gluconate Cloth  6 each Topical Q0600  . Chlorhexidine Gluconate Cloth  6 each Topical Daily  . mouth rinse  15 mL Mouth Rinse q12n4p  . mupirocin ointment  1 application Nasal BID  . pantoprazole (PROTONIX) IV  40 mg Intravenous Q12H    SUBJECTIVE: Katherine Cooper was found laying in her bed moaning. She was not responsive to questions and seemed altered.   Review of Systems: ROS as above   Allergies  Allergen Reactions  . Sulfa Antibiotics Itching    OBJECTIVE: Vitals:   08/02/17 0915 08/02/17 0930 08/02/17 0945 08/02/17 1000  BP:      Pulse: (!) 120 (!) 119 (!) 125 (!) 122  Resp: 16 19 17 17   Temp:      TempSrc:      SpO2: 93% 92% 93% 92%  Weight:      Height:       Body mass index is 49.4 kg/m.  Physical Exam  Constitutional: She appears unhealthy. She appears toxic. She has a sickly appearance.  HENT:  Head: Normocephalic and atraumatic.  Eyes: Conjunctivae are normal.  Cardiovascular: Normal rate, regular rhythm and normal heart sounds.   Pulmonary/Chest: Effort normal.    Abdominal: Soft. Bowel sounds are normal. She exhibits no distension. There is no tenderness.  Neurological:  Not alert    Lab Results Lab Results  Component Value Date   WBC 20.5 (H) 08/02/2017   HGB 8.9 (L) 08/02/2017   HCT 26.9 (L) 08/02/2017   MCV 87.9 08/02/2017   PLT 217 08/02/2017    Lab Results  Component Value Date   CREATININE 3.74 (H) 08/02/2017   BUN 58 (H) 08/02/2017   NA 134 (L) 08/02/2017   K 3.6 08/02/2017   CL 106 08/02/2017   CO2 15 (L) 08/02/2017    Lab Results  Component Value Date   ALT 83 (H) 08/02/2017   AST 37 08/02/2017   ALKPHOS 179 (H) 08/02/2017   BILITOT 1.0 08/02/2017     Microbiology: Recent Results (from the past 240 hour(s))  Culture, blood (routine x 2)     Status: Abnormal (Preliminary result)   Collection Time: 08/22/2017  6:19 PM  Result Value Ref Range Status   Specimen Description BLOOD FEMORAL ARTERY VEIN  Final   Special Requests IN PEDIATRIC BOTTLE Blood Culture adequate volume  Final   Culture  Setup Time   Final  GRAM POSITIVE COCCI IN PEDIATRIC BOTTLE CRITICAL RESULT CALLED TO, READ BACK BY AND VERIFIED WITH: E. Vernona Rieger Pharm.D. 10:40 08/01/17 ([email or web address])    Culture (A)  Final    STAPHYLOCOCCUS AUREUS SUSCEPTIBILITIES TO FOLLOW    Report Status PENDING  Incomplete  Blood Culture ID Panel (Reflexed)     Status: Abnormal   Collection Time: 08/16/2017  6:19 PM  Result Value Ref Range Status   Enterococcus species NOT DETECTED NOT DETECTED Final   Listeria monocytogenes NOT DETECTED NOT DETECTED Final   Staphylococcus species DETECTED (A) NOT DETECTED Final    Comment: CRITICAL RESULT CALLED TO, READ BACK BY AND VERIFIED WITH: E. Vernona Rieger Pharm.D. 10:40 08/01/17 ([email or web address])    Staphylococcus aureus DETECTED (A) NOT DETECTED Final    Comment: Methicillin (oxacillin)-resistant Staphylococcus aureus (MRSA). MRSA is predictably resistant to beta-lactam antibiotics (except ceftaroline). Preferred therapy is vancomycin unless  clinically contraindicated. Patient requires contact precautions if  hospitalized. CRITICAL RESULT CALLED TO, READ BACK BY AND VERIFIED WITH: E. Vernona Rieger Pharm.D. 10:40 08/01/17 (wilsonm)    Methicillin resistance DETECTED (A) NOT DETECTED Final    Comment: CRITICAL RESULT CALLED TO, READ BACK BY AND VERIFIED WITH: E. Vernona Rieger Pharm.D. 10:40 08/01/17 (wilsonm)    Streptococcus species NOT DETECTED NOT DETECTED Final   Streptococcus agalactiae NOT DETECTED NOT DETECTED Final   Streptococcus pneumoniae NOT DETECTED NOT DETECTED Final   Streptococcus pyogenes NOT DETECTED NOT DETECTED Final   Acinetobacter baumannii NOT DETECTED NOT DETECTED Final   Enterobacteriaceae species NOT DETECTED NOT DETECTED Final   Enterobacter cloacae complex NOT DETECTED NOT DETECTED Final   Escherichia coli NOT DETECTED NOT DETECTED Final   Klebsiella oxytoca NOT DETECTED NOT DETECTED Final   Klebsiella pneumoniae NOT DETECTED NOT DETECTED Final   Proteus species NOT DETECTED NOT DETECTED Final   Serratia marcescens NOT DETECTED NOT DETECTED Final   Haemophilus influenzae NOT DETECTED NOT DETECTED Final   Neisseria meningitidis NOT DETECTED NOT DETECTED Final   Pseudomonas aeruginosa NOT DETECTED NOT DETECTED Final   Candida albicans NOT DETECTED NOT DETECTED Final   Candida glabrata NOT DETECTED NOT DETECTED Final   Candida krusei NOT DETECTED NOT DETECTED Final   Candida parapsilosis NOT DETECTED NOT DETECTED Final   Candida tropicalis NOT DETECTED NOT DETECTED Final  Culture, blood (routine x 2)     Status: None (Preliminary result)   Collection Time: 08/12/2017  6:22 PM  Result Value Ref Range Status   Specimen Description BLOOD RIGHT WRIST  Final   Special Requests   Final    BOTTLES DRAWN AEROBIC ONLY Blood Culture adequate volume   Culture NO GROWTH < 24 HOURS  Final   Report Status PENDING  Incomplete  Urine culture     Status: Abnormal   Collection Time: 08/20/2017  8:04 PM  Result Value Ref Range  Status   Specimen Description URINE, RANDOM  Final   Special Requests NONE  Final   Culture MULTIPLE SPECIES PRESENT, SUGGEST RECOLLECTION (A)  Final   Report Status 08/02/2017 FINAL  Final  MRSA PCR Screening     Status: Abnormal   Collection Time: 08/01/17 12:41 AM  Result Value Ref Range Status   MRSA by PCR POSITIVE (A) NEGATIVE Final    Comment:        The GeneXpert MRSA Assay (FDA approved for NASAL specimens only), is one component of a comprehensive MRSA colonization surveillance program. It is not intended to diagnose MRSA infection nor to guide or monitor treatment for MRSA infections. CRITICAL  RESULT CALLED TO, READ BACK BY AND VERIFIED WITH: A. LEWIS, RN AT [phone] ON 08/01/17 BY C, JESSUP, MLT.     Lars Mage, [place] for [place] [phone] pager   [phone] cell 08/02/2017, 10:20 AM

## 2017-08-02 NOTE — Progress Notes (Signed)
PULMONARY / CRITICAL CARE MEDICINE   Name: Katherine Cooper MRN: [id number] DOB: [birth year]    ADMISSION DATE:  08/09/2017 CONSULTATION DATE:  08/11/2017  REFERRING MD:  Dr. Ralene Bathe   CHIEF COMPLAINT:  Sepsis   HISTORY OF PRESENT ILLNESS:  76 y.o. female with PMH of Alzheimer dementia, Anemia, Breast CA, CKD, GERD, HTN, Hypothyroid, CVA. Presents to ED on 8/7 with progressive lethargy. Family reports that patient has been at [facility] since February 2018 and since has been bed bound however, alert and verbal and confused. Patient has stage 3 sacral pressure ulcer (which looks clean). For the last few days reportedly has been non-verbal and moaning. Upon arrival patient was hypoglycemic (glucose 49) and hypotensive (84/69). WBC 18.7, LA 2.10, administered 3L NS and started on Levophed gtt. CT head negative for acute process. PCCM asked to admit.   SUBJECTIVE:  Remains on pressors. No acute events overnight. No further GI bleeding.   REVIEW OF SYSTEMS:  Unable to obtain with encephalopathy & baseline dementia.   VITAL SIGNS: BP (!) 87/65   Pulse (!) 122   Temp 99.7 F (37.6 C) (Axillary)   Resp 17   Ht 5\' 3"  (1.6 m)   Wt 278 lb 14.1 oz (126.5 kg)   SpO2 92%   BMI 49.40 kg/m   HEMODYNAMICS:    VENTILATOR SETTINGS:    INTAKE / OUTPUT: I/O last 3 completed shifts: In: 5510.2 [I.V.:4907.2; Blood:553; IV Piggyback:50] Out: 280 [Urine:280]  PHYSICAL EXAMINATION: General:  No family at bedside. Undergoing echo.  Integument:  Warm. Dry. No rash on exposed skin. HEENT:  No scleral icterus. Moist membranes. Cardiovascular:  Regular rate. Unable to appreciate JVD. Pulmonary:  Normal effort of breathing. Good aeration bilaterally.  Abdomen: Portuberant. Soft. Normal bowel sounds.  Neurological:  Moaning. Eyes open. Not answering questions or following commands.   LABS:  BMET  Recent Labs Lab 08/05/2017 1721 08/17/2017 1730 08/01/17 0145 08/02/17 0808  NA 136 138 138 134*  K 5.7* 4.8  4.6 3.6  CL 112* 114* 117* 106  CO2 12*  --  10* 15*  BUN 74* 81* 63* 58*  CREATININE 4.83* 4.80* 3.98* 3.74*  GLUCOSE 202* 195* 141* 136*    Electrolytes  Recent Labs Lab 07/28/2017 1721 08/01/17 0145 08/02/17 0808  CALCIUM 9.2 9.0 8.4*  MG  --  1.7 1.7  PHOS  --  3.5 3.7    CBC  Recent Labs Lab 08/01/17 0145 08/01/17 0959 08/01/17 1700 08/02/17 0808  WBC 18.8*  --  21.1* 20.5*  HGB 7.6* 9.2* 8.9* 8.9*  HCT 24.9* 28.5* 27.6* 26.9*  PLT 275  --  226 217    Coag's  Recent Labs Lab 08/01/17 0429 08/02/17 0808  INR 2.14 1.49    Sepsis Markers  Recent Labs Lab 08/01/17 0100 08/01/17 0145 08/01/17 0400 08/01/17 2314  LATICACIDVEN 2.5*  --  4.1* 1.4  PROCALCITON  --  13.42  --   --     ABG  Recent Labs Lab 08/01/17 0808 08/01/17 1640 08/02/17 0343  PHART 7.301* 7.405 7.392  PCO2ART 22.2* 21.8* 26.2*  PO2ART 101 87.0 93.6    Liver Enzymes  Recent Labs Lab 07/29/2017 1721 08/02/17 0808  AST 52* 37  ALT 60* 83*  ALKPHOS 165* 179*  BILITOT 1.8* 1.0  ALBUMIN 1.4* 1.5*    Cardiac Enzymes No results for input(s): TROPONINI, PROBNP in the last 168 hours.  Glucose  Recent Labs Lab 08/01/17 1117 08/01/17 1532 08/01/17 1949 08/01/17 2315 08/02/17  5277 08/02/17 0749  GLUCAP 138* 146* 136* 142* 116* 127*    Imaging No results found.   STUDIES:  CT HEAD W/O 8/7:  No acute findings.  Stable mild atrophy and small vessel disease. CT RENAL STONE STUDY 8/7:  Large amount of stool within the rectum as can be seen with impaction. Moderate hiatal hernia. Hepatic steatosis. High attenuation within the gallbladder lumen most suggestive of stones/sludge. RENAL U/S 8/7:  Both kidneys demonstrate mild cortical thinning. No hydronephrosis identified. PORT CXR 8/8:  Previously reviewed by me. Left internal jugular central venous catheter in good position. No parenchymal opacity or mass appreciated. No pleural effusion. TTE 8/9  >>>  MICROBIOLOGY: MRSA PCR 8/8:  Positive Blood Cultures x2 8/7 >>> 1/2 Bottles Positive MRSA Urine Ctx 8/7 (from SNF Foley) >>> Multiple Species Present Blood Cultures x2 8/8 >>> Urine Culture 8/9 >>>  ANTIBIOTICS: Zosyn 8/7 (x1 dose) Meropenem 8/7 (x1 dose) Vancomycin 8/7 >>>  SIGNIFICANT EVENTS: 8/07 - Admit   LINES/TUBES: L IJ CVL 8/7 >>> R RAD ART LINE 8/8 >>> FOLEY (from Facility) Replaced 8/8 >>>  ASSESSMENT / PLAN:  PULMONARY A: Acute hypoxic respiratory failure: Likely secondary to atelectasis. Improving.  P:   Weaning FiO2 Close monitoring given risk of intubation Continuous pulse oximetry monitoring  CARDIOVASCULAR A:  Shock: Secondary to sepsis. History of essential hypertension  P:  Weaning vasopressors for MAP >65 & SBP >90 Continue telemetry monitoring Vital signs per unit protocol Echocardiogram pending  RENAL A:   Acute on chronic renal failure stage IV:  Improving. Metabolic/lactic acidosis:  Improving.  Hyponatremia:  Mild.  Hyperchloremia:  Resolved.   P:   Trending UOP with Foley Monitoring electrolytes & renal function closesly Trending Lactic Acid Continuing Bicarb drip at 75cc/hr   GASTROINTESTINAL A:   Transmainitis:  Mild. Stable.  GIB Impacted Stool:  S/P disimpaction.  Failure to Thrive GERD  P:   NPO Protonix IV q12hr GI Consulted Cortrak Placement Diet Consult for Tube Feedings   HEMATOLOGIC A:   Anemia:  Multifactorial from blood loss & chronic disease. Hgb Stable.  Coagulopathy:  S/P FFP & Vitamin K 5mg  IV. Mild.  Leukocytosis:  Secondary to sepsis. Improving.   P:  Trending cell counts q12hr SCDs Repeating Coags in AM  INFECTIOUS A:   MRSA Bacteremia Sepsis:  H/O ESBL Klebsiella UTI (04/2017) and MRSA bacteremia (04/2017). Possible Osteomyelitis:  Stage 3 sacral decub.  Possible UTI:  Cultures performed from prior SNF Foley before changing.  P:   Awaiting finalization of cultures ID following &  appreciate recommendations Empiric Vancomycin Trending Procalcitonin per algorithm  Repeat U/A & Culture today   ENDOCRINE A:   Hypoglycemia:  Improved.    P:   Accu-Checks q4hr w/ MD notification parameters.  NEUROLOGIC A:   Acute Encephalopathy:  Likely due to toxic metabolic. H/O Alzheimer's Dementia   P:   Avoiding sedating medications.    FAMILY  - Updates:  Family updated at the time of rounds 8/9.   - Inter-disciplinary family meet or Palliative Care meeting due by: 08/07/2017  DISCUSSION:  76 y.o. female with septic shock and multisystem organ failure due to sepsis. Likely secondary to bacteremia. Repeating urine studies today as cultures and urinalysis were obtained before changing outpatient chronic indwelling Foley catheter from her skilled nursing facility. Mental status without any meaningful improvement today. Consulting palliative medicine to help guide further goals of care discussions with family. Appreciate assistance from ID in the care of this patient.  I have spent an additional total of 31 minutes of critical care time today caring for the patient, updating her family, and reviewing the patient's electronic medical record.   Sonia Baller Ashok Cordia, M.D. [facility] Pager:  [phone] After 3pm or if no response, call [phone] 10:52 AM 08/02/17

## 2017-08-03 ENCOUNTER — Inpatient Hospital Stay (HOSPITAL_COMMUNITY): Payer: Medicare Other

## 2017-08-03 DIAGNOSIS — R571 Hypovolemic shock: Secondary | ICD-10-CM

## 2017-08-03 DIAGNOSIS — Z7189 Other specified counseling: Secondary | ICD-10-CM

## 2017-08-03 DIAGNOSIS — A4101 Sepsis due to Methicillin susceptible Staphylococcus aureus: Secondary | ICD-10-CM

## 2017-08-03 DIAGNOSIS — K922 Gastrointestinal hemorrhage, unspecified: Secondary | ICD-10-CM

## 2017-08-03 DIAGNOSIS — Z515 Encounter for palliative care: Secondary | ICD-10-CM

## 2017-08-03 DIAGNOSIS — F0150 Vascular dementia without behavioral disturbance: Secondary | ICD-10-CM

## 2017-08-03 LAB — CBC
HEMATOCRIT: 27.2 % — AB (ref 36.0–46.0)
HEMATOCRIT: 25.5 % — AB (ref 36.0–46.0)
HEMOGLOBIN: 8.8 g/dL — AB (ref 12.0–15.0)
HEMOGLOBIN: 8.3 g/dL — AB (ref 12.0–15.0)
MCH: 28.7 pg (ref 26.0–34.0)
MCH: 29 pg (ref 26.0–34.0)
MCHC: 32.4 g/dL (ref 30.0–36.0)
MCHC: 32.5 g/dL (ref 30.0–36.0)
MCV: 88.6 fL (ref 78.0–100.0)
MCV: 89.2 fL (ref 78.0–100.0)
PLATELETS: 194 10*3/uL (ref 150–400)
Platelets: 193 10*3/uL (ref 150–400)
RBC: 3.07 MIL/uL — AB (ref 3.87–5.11)
RBC: 2.86 MIL/uL — ABNORMAL LOW (ref 3.87–5.11)
RDW: 18.7 % — ABNORMAL HIGH (ref 11.5–15.5)
RDW: 18.5 % — ABNORMAL HIGH (ref 11.5–15.5)
WBC: 22.2 10*3/uL — AB (ref 4.0–10.5)
WBC: 20 10*3/uL — ABNORMAL HIGH (ref 4.0–10.5)

## 2017-08-03 LAB — CBC WITH DIFFERENTIAL/PLATELET
BASOS ABS: 0 10*3/uL (ref 0.0–0.1)
Basophils Relative: 0 %
Eosinophils Absolute: 0 10*3/uL (ref 0.0–0.7)
Eosinophils Relative: 0 %
HEMATOCRIT: 26.3 % — AB (ref 36.0–46.0)
Hemoglobin: 8.6 g/dL — ABNORMAL LOW (ref 12.0–15.0)
LYMPHS ABS: 2.5 10*3/uL (ref 0.7–4.0)
Lymphocytes Relative: 12 %
MCH: 28.7 pg (ref 26.0–34.0)
MCHC: 32.7 g/dL (ref 30.0–36.0)
MCV: 87.7 fL (ref 78.0–100.0)
MONOS PCT: 7 %
Monocytes Absolute: 1.5 10*3/uL — ABNORMAL HIGH (ref 0.1–1.0)
NEUTROS ABS: 17.2 10*3/uL — AB (ref 1.7–7.7)
Neutrophils Relative %: 81 %
Platelets: 189 10*3/uL (ref 150–400)
RBC: 3 MIL/uL — ABNORMAL LOW (ref 3.87–5.11)
RDW: 18.5 % — AB (ref 11.5–15.5)
WBC: 21.2 10*3/uL — ABNORMAL HIGH (ref 4.0–10.5)

## 2017-08-03 LAB — APTT: aPTT: 40 seconds — ABNORMAL HIGH (ref 24–36)

## 2017-08-03 LAB — PROTIME-INR
INR: 1.49
INR: 1.47
Prothrombin Time: 18.2 seconds — ABNORMAL HIGH (ref 11.4–15.2)
Prothrombin Time: 18 seconds — ABNORMAL HIGH (ref 11.4–15.2)

## 2017-08-03 LAB — COMPREHENSIVE METABOLIC PANEL
ALK PHOS: 166 U/L — AB (ref 38–126)
ALT: 89 U/L — ABNORMAL HIGH (ref 14–54)
ANION GAP: 11 (ref 5–15)
AST: 39 U/L (ref 15–41)
Albumin: 1.4 g/dL — ABNORMAL LOW (ref 3.5–5.0)
BILIRUBIN TOTAL: 0.8 mg/dL (ref 0.3–1.2)
BUN: 56 mg/dL — AB (ref 6–20)
CO2: 20 mmol/L — ABNORMAL LOW (ref 22–32)
CREATININE: 3.62 mg/dL — AB (ref 0.44–1.00)
Calcium: 8.2 mg/dL — ABNORMAL LOW (ref 8.9–10.3)
Chloride: 103 mmol/L (ref 101–111)
GFR, EST AFRICAN AMERICAN: 13 mL/min — AB (ref >60)
GFR, EST NON AFRICAN AMERICAN: 11 mL/min — AB (ref >60)
Glucose, Bld: 114 mg/dL — ABNORMAL HIGH (ref 65–99)
Potassium: 3.2 mmol/L — ABNORMAL LOW (ref 3.5–5.1)
SODIUM: 134 mmol/L — AB (ref 135–145)
Total Protein: 4.4 g/dL — ABNORMAL LOW (ref 6.5–8.1)

## 2017-08-03 LAB — URINE CULTURE
Culture: NO GROWTH
Special Requests: NORMAL

## 2017-08-03 LAB — GLUCOSE, CAPILLARY
GLUCOSE-CAPILLARY: 113 mg/dL — AB (ref 65–99)
GLUCOSE-CAPILLARY: 95 mg/dL (ref 65–99)
Glucose-Capillary: 100 mg/dL — ABNORMAL HIGH (ref 65–99)
Glucose-Capillary: 107 mg/dL — ABNORMAL HIGH (ref 65–99)
Glucose-Capillary: 113 mg/dL — ABNORMAL HIGH (ref 65–99)
Glucose-Capillary: 117 mg/dL — ABNORMAL HIGH (ref 65–99)
Glucose-Capillary: 132 mg/dL — ABNORMAL HIGH (ref 65–99)

## 2017-08-03 LAB — CULTURE, BLOOD (ROUTINE X 2)
SPECIAL REQUESTS: ADEQUATE
Special Requests: ADEQUATE

## 2017-08-03 LAB — LACTIC ACID, PLASMA: LACTIC ACID, VENOUS: 3.1 mmol/L — AB (ref 0.5–1.9)

## 2017-08-03 LAB — PREPARE RBC (CROSSMATCH)

## 2017-08-03 LAB — PROCALCITONIN: PROCALCITONIN: 5.1 ng/mL

## 2017-08-03 LAB — MAGNESIUM: Magnesium: 1.7 mg/dL (ref 1.7–2.4)

## 2017-08-03 LAB — PHOSPHORUS: PHOSPHORUS: 3.6 mg/dL (ref 2.5–4.6)

## 2017-08-03 MED ORDER — CHLORHEXIDINE GLUCONATE CLOTH 2 % EX PADS: 6.0000 | MEDICATED_PAD | Freq: Every day | CUTANEOUS | Status: DC

## 2017-08-03 MED ORDER — POTASSIUM CHLORIDE 10 MEQ/50ML IV SOLN
INTRAVENOUS | Status: AC
  Filled 2017-08-03: qty 50

## 2017-08-03 MED ORDER — POTASSIUM CHLORIDE 10 MEQ/50ML IV SOLN
10.0000 meq | INTRAVENOUS | Status: AC
  Administered 2017-08-03: 10 meq via INTRAVENOUS

## 2017-08-03 MED ORDER — PRO-STAT SUGAR FREE PO LIQD
30.0000 mL | Freq: Three times a day (TID) | ORAL | Status: DC
  Administered 2017-08-03 – 2017-08-04 (×4): 30 mL
  Filled 2017-08-03 (×6): qty 30

## 2017-08-03 MED ORDER — SODIUM CHLORIDE 0.9 % IV BOLUS (SEPSIS)
1000.0000 mL | Freq: Once | INTRAVENOUS | Status: AC
  Administered 2017-08-03: 1000 mL via INTRAVENOUS

## 2017-08-03 MED ORDER — HEPARIN SODIUM (PORCINE) 5000 UNIT/ML IJ SOLN
5000.0000 [IU] | Freq: Three times a day (TID) | INTRAMUSCULAR | Status: DC
  Administered 2017-08-03: 5000 [IU] via SUBCUTANEOUS
  Filled 2017-08-03 (×3): qty 1

## 2017-08-03 MED ORDER — HYDROCORTISONE NA SUCCINATE PF 100 MG IJ SOLR
100.0000 mg | Freq: Three times a day (TID) | INTRAMUSCULAR | Status: DC
  Administered 2017-08-03 – 2017-08-05 (×5): 100 mg via INTRAVENOUS
  Filled 2017-08-03 (×6): qty 2

## 2017-08-03 MED ORDER — JEVITY 1.2 CAL PO LIQD
1000.0000 mL | ORAL | Status: DC
  Administered 2017-08-03: 1000 mL
  Filled 2017-08-03 (×6): qty 1000

## 2017-08-03 MED ORDER — POTASSIUM CHLORIDE 10 MEQ/50ML IV SOLN
10.0000 meq | INTRAVENOUS | Status: AC
  Administered 2017-08-03 (×3): 10 meq via INTRAVENOUS
  Filled 2017-08-03 (×3): qty 50

## 2017-08-03 MED ORDER — SODIUM CHLORIDE 0.9 % IV SOLN
Freq: Once | INTRAVENOUS | Status: AC
  Administered 2017-08-04: 02:00:00 via INTRAVENOUS

## 2017-08-03 MED ORDER — PHENYLEPHRINE HCL 10 MG/ML IJ SOLN
0.0000 ug/min | INTRAMUSCULAR | Status: DC
  Administered 2017-08-03: 400 ug/min via INTRAVENOUS
  Administered 2017-08-04: 75 ug/min via INTRAVENOUS
  Administered 2017-08-04: 400 ug/min via INTRAVENOUS
  Filled 2017-08-03 (×4): qty 8

## 2017-08-03 MED ORDER — SODIUM CHLORIDE 0.9 % IV SOLN
0.0000 ug/min | INTRAVENOUS | Status: DC
  Administered 2017-08-03: 50 ug/min via INTRAVENOUS
  Filled 2017-08-03 (×2): qty 1

## 2017-08-03 NOTE — Progress Notes (Signed)
eLink Physician-Brief Progress Note Patient Name: Katherine Cooper DOB: January 02, 1941 MRN: [id number]   Date of Service  08/03/2017  HPI/Events of Note  Hypokalemia  eICU Interventions  Potassium replaced     Intervention Category Major Interventions: Acid-Base disturbance - evaluation and management Intermediate Interventions: Electrolyte abnormality - evaluation and management  Katherine Cooper 08/03/2017, 2:58 AM

## 2017-08-03 NOTE — Progress Notes (Signed)
[facility] for Infectious Disease  Date of Admission:  07/25/2017   Total days of antibiotics 4        Day 4 Vancomycin        Zosyn stopped 8/7        Meropenem stopped 8/8         ASSESSMENT and PLAN: MRSA Bacteremia Septic Shock  The patient was found to have MRSA per biofire results 8/8. Decubitus ulcer on patient's back is the likely source of her infection.  -Repeat blood culture collected 8/7 shows no growth 3 days -Continue vancomycin -Vancomycin dosing and trough per pharmacy assistance   -cr=3.62 -Palliative care is arranging a family meeting to discuss goals of care and come to an agreement regarding plan.  -Suggested cath holiday when possible  -TTE showed a likely vegetation on the mitral valve    . chlorhexidine  15 mL Mouth Rinse BID  . Chlorhexidine Gluconate Cloth  6 each Topical Q0600  . Chlorhexidine Gluconate Cloth  6 each Topical Daily  . heparin subcutaneous  5,000 Units Subcutaneous Q8H  . mouth rinse  15 mL Mouth Rinse q12n4p  . mupirocin ointment  1 application Nasal BID  . pantoprazole (PROTONIX) IV  40 mg Intravenous Q12H    SUBJECTIVE: Ms. Prew was seen laying in her bed. She was not alert and was moaning.   Review of Systems: ROS as above  Allergies  Allergen Reactions  . Sulfa Antibiotics Itching    OBJECTIVE: Vitals:   08/03/17 1245 08/03/17 1300 08/03/17 1315 08/03/17 1330  BP:      Pulse: (!) 116 (!) 113 (!) 119 (!) 113  Resp: 15 14 16 19   Temp:      TempSrc:      SpO2: 95% 97% 95% 99%  Weight:      Height:       Body mass index is 50.5 kg/m.  Physical Exam  Constitutional: She appears unhealthy. She has a sickly appearance. She appears distressed.  Patient was moaning on exam  HENT:  Head: Normocephalic and atraumatic.  Cardiovascular: Normal rate, regular rhythm and normal heart sounds.   Pulmonary/Chest: Effort normal and breath sounds normal. No respiratory distress. She has no rales.    Abdominal: Soft.    Lab Results Lab Results  Component Value Date   WBC 21.2 (H) 08/03/2017   HGB 8.6 (L) 08/03/2017   HCT 26.3 (L) 08/03/2017   MCV 87.7 08/03/2017   PLT 189 08/03/2017    Lab Results  Component Value Date   CREATININE 3.62 (H) 08/03/2017   BUN 56 (H) 08/03/2017   NA 134 (L) 08/03/2017   K 3.2 (L) 08/03/2017   CL 103 08/03/2017   CO2 20 (L) 08/03/2017    Lab Results  Component Value Date   ALT 89 (H) 08/03/2017   AST 39 08/03/2017   ALKPHOS 166 (H) 08/03/2017   BILITOT 0.8 08/03/2017     Microbiology: Recent Results (from the past 240 hour(s))  Culture, blood (routine x 2)     Status: Abnormal   Collection Time: 08/05/2017  6:19 PM  Result Value Ref Range Status   Specimen Description BLOOD FEMORAL ARTERY VEIN  Final   Special Requests IN PEDIATRIC BOTTLE Blood Culture adequate volume  Final   Culture  Setup Time   Final    GRAM POSITIVE COCCI IN PEDIATRIC BOTTLE CRITICAL RESULT CALLED TO, READ BACK BY AND VERIFIED WITH: EVernona Rieger Pharm.D. 10:40  08/01/17 ([email or web address])    Culture METHICILLIN RESISTANT STAPHYLOCOCCUS AUREUS (A)  Final   Report Status 08/03/2017 FINAL  Final   Organism ID, Bacteria METHICILLIN RESISTANT STAPHYLOCOCCUS AUREUS  Final      Susceptibility   Methicillin resistant staphylococcus aureus - MIC*    CIPROFLOXACIN >=8 RESISTANT Resistant     ERYTHROMYCIN >=8 RESISTANT Resistant     GENTAMICIN <=0.5 SENSITIVE Sensitive     OXACILLIN >=4 RESISTANT Resistant     TETRACYCLINE <=1 SENSITIVE Sensitive     VANCOMYCIN <=0.5 SENSITIVE Sensitive     TRIMETH/SULFA >=320 RESISTANT Resistant     CLINDAMYCIN <=0.25 SENSITIVE Sensitive     RIFAMPIN <=0.5 SENSITIVE Sensitive     Inducible Clindamycin NEGATIVE Sensitive     * METHICILLIN RESISTANT STAPHYLOCOCCUS AUREUS  Blood Culture ID Panel (Reflexed)     Status: Abnormal   Collection Time: 08/03/2017  6:19 PM  Result Value Ref Range Status   Enterococcus species NOT DETECTED NOT DETECTED  Final   Listeria monocytogenes NOT DETECTED NOT DETECTED Final   Staphylococcus species DETECTED (A) NOT DETECTED Final    Comment: CRITICAL RESULT CALLED TO, READ BACK BY AND VERIFIED WITH: E. Vernona Rieger Pharm.D. 10:40 08/01/17 ([email or web address])    Staphylococcus aureus DETECTED (A) NOT DETECTED Final    Comment: Methicillin (oxacillin)-resistant Staphylococcus aureus (MRSA). MRSA is predictably resistant to beta-lactam antibiotics (except ceftaroline). Preferred therapy is vancomycin unless clinically contraindicated. Patient requires contact precautions if  hospitalized. CRITICAL RESULT CALLED TO, READ BACK BY AND VERIFIED WITH: E. Vernona Rieger Pharm.D. 10:40 08/01/17 (wilsonm)    Methicillin resistance DETECTED (A) NOT DETECTED Final    Comment: CRITICAL RESULT CALLED TO, READ BACK BY AND VERIFIED WITH: E. Vernona Rieger Pharm.D. 10:40 08/01/17 (wilsonm)    Streptococcus species NOT DETECTED NOT DETECTED Final   Streptococcus agalactiae NOT DETECTED NOT DETECTED Final   Streptococcus pneumoniae NOT DETECTED NOT DETECTED Final   Streptococcus pyogenes NOT DETECTED NOT DETECTED Final   Acinetobacter baumannii NOT DETECTED NOT DETECTED Final   Enterobacteriaceae species NOT DETECTED NOT DETECTED Final   Enterobacter cloacae complex NOT DETECTED NOT DETECTED Final   Escherichia coli NOT DETECTED NOT DETECTED Final   Klebsiella oxytoca NOT DETECTED NOT DETECTED Final   Klebsiella pneumoniae NOT DETECTED NOT DETECTED Final   Proteus species NOT DETECTED NOT DETECTED Final   Serratia marcescens NOT DETECTED NOT DETECTED Final   Haemophilus influenzae NOT DETECTED NOT DETECTED Final   Neisseria meningitidis NOT DETECTED NOT DETECTED Final   Pseudomonas aeruginosa NOT DETECTED NOT DETECTED Final   Candida albicans NOT DETECTED NOT DETECTED Final   Candida glabrata NOT DETECTED NOT DETECTED Final   Candida krusei NOT DETECTED NOT DETECTED Final   Candida parapsilosis NOT DETECTED NOT DETECTED Final   Candida  tropicalis NOT DETECTED NOT DETECTED Final  Culture, blood (routine x 2)     Status: None (Preliminary result)   Collection Time: 07/29/2017  6:22 PM  Result Value Ref Range Status   Specimen Description BLOOD RIGHT WRIST  Final   Special Requests   Final    BOTTLES DRAWN AEROBIC ONLY Blood Culture adequate volume   Culture NO GROWTH 3 DAYS  Final   Report Status PENDING  Incomplete  Urine culture     Status: Abnormal   Collection Time: 08/19/2017  8:04 PM  Result Value Ref Range Status   Specimen Description URINE, RANDOM  Final   Special Requests NONE  Final   Culture MULTIPLE SPECIES PRESENT, SUGGEST RECOLLECTION (A)  Final  Report Status 08/02/2017 FINAL  Final  MRSA PCR Screening     Status: Abnormal   Collection Time: 08/01/17 12:41 AM  Result Value Ref Range Status   MRSA by PCR POSITIVE (A) NEGATIVE Final    Comment:        The GeneXpert MRSA Assay (FDA approved for NASAL specimens only), is one component of a comprehensive MRSA colonization surveillance program. It is not intended to diagnose MRSA infection nor to guide or monitor treatment for MRSA infections. CRITICAL RESULT CALLED TO, READ BACK BY AND VERIFIED WITH: A. LEWIS, RN AT [phone] ON 08/01/17 BY C, JESSUP, MLT.   Culture, blood (Routine X 2) w Reflex to ID Panel     Status: None (Preliminary result)   Collection Time: 08/01/17  1:34 PM  Result Value Ref Range Status   Specimen Description BLOOD LEFT HAND  Final   Special Requests IN PEDIATRIC BOTTLE Blood Culture adequate volume  Final   Culture NO GROWTH 2 DAYS  Final   Report Status PENDING  Incomplete  Culture, blood (Routine X 2) w Reflex to ID Panel     Status: None (Preliminary result)   Collection Time: 08/01/17  1:34 PM  Result Value Ref Range Status   Specimen Description BLOOD LEFT HAND  Final   Special Requests IN PEDIATRIC BOTTLE Blood Culture adequate volume  Final   Culture  Setup Time   Final    GRAM POSITIVE COCCI IN PEDIATRIC  BOTTLE CRITICAL RESULT CALLED TO, READ BACK BY AND VERIFIED WITH: A MAYER PHARMD AT [place] ON [id number] BY SJW    Culture GRAM POSITIVE COCCI IDENTIFICATION TO FOLLOW   Final   Report Status PENDING  Incomplete  Culture, Urine     Status: None   Collection Time: 08/02/17 12:21 PM  Result Value Ref Range Status   Specimen Description URINE, CATHETERIZED  Final   Special Requests Normal  Final   Culture NO GROWTH  Final   Report Status 08/03/2017 FINAL  Final    Lars Mage, [place] for Infectious Disease [place] [facility] [phone] pager   [phone] cell 08/03/2017, 1:46 PM

## 2017-08-03 NOTE — Progress Notes (Signed)
eLink Physician-Brief Progress Note Patient Name: Katherine Cooper DOB: [birth year] MRN: [id number]   Date of Service  08/03/2017  HPI/Events of Note  Lower GI bleed per Dr. Jimmey Ralph  eICU Interventions  Dr. Carlean Purl from GI called and will see     Intervention Category Major Interventions: Other:  Katherine Cooper 08/03/2017, 10:46 PM

## 2017-08-03 NOTE — Progress Notes (Addendum)
Patient well known to me, admitted with septic shock and on night of admission had some LGIB due to fecal impaction that resolved after disimpaction. Tonight at 9pm she was rolled to clean her up and began at that time having brisk BRBPR, the RN estimates 240cc total blood loss. Looking at her vitals trend, her levophed increased from 90mcg earlier today to 29mcg at 7pm to now 69mcg. She is also on Vasopressin 0.04. On my exam, MAP 54, patient somnolent but arousable to voice (this is not much different from mental status on admission). Lungs CTA b/l, Abd obese, soft, mildly TTP diffusely. Large puddle of maroon blood present on bed. Review of her labs shows her procalcitonin when last checked was high but improving. Lactate had improved from 4.1 to 1.4 when last checked 8.8. Was in mild DIC on 8/8 with Fibrinogen low at 103 and INR elevated at 2.14. Now INR 1.47. Hgb 8.3 now, down from 8.8 earlier today. Platelets in 190's.   Plan: - worsening shock unclear if due to worsening sepsis or GIB, but in the setting of clinically worsening GIB, would favor the shock being from acute blood loss - consult GI Warren Lacy MD to do) - check fibrinogen, procal, lactate - check CVP - start hydrocortisone; add 3rd vasopressor.   35 min critical care time  Vernie Murders, MD Pulmonary & Critical Care

## 2017-08-03 NOTE — Progress Notes (Signed)
Nutrition Follow-up  DOCUMENTATION CODES:   Morbid obesity  INTERVENTION:    Jevity 1.2 at 60 ml/h (1440 ml per day)  Pro-stat 30 ml TID  Provides 2028 kcal, 125 gm protein, 1166 ml free water daily  NUTRITION DIAGNOSIS:   Increased nutrient needs related to wound healing as evidenced by estimated needs.  Ongoing  GOAL:   Patient will meet greater than or equal to 90% of their needs  Unmet  MONITOR:   TF tolerance, Skin, Labs, I & O's  REASON FOR ASSESSMENT:   Consult Enteral/tube feeding initiation and management  ASSESSMENT:   76 year old female with PMH of HTN, obesity, CKD, stroke, hypothyroidism, CAP, Alzheimer's dementia, and GERD, who was admitted on 8/7 with septic shock and multisystem organ failure.  Discussed patient in ICU rounds and with RN today. Palliative Care note reviewed. Family members are not all in agreement on goals of care for patient. Current plan is to continue current care. Noted poor prognosis and hospital death is likely. S/P bedside swallow evaluation with SLP today; patient is not alert enough to take PO's safely. Cortrak tube being placed today. RD to order TF. Labs and medications reviewed.  Diet Order:  Diet NPO time specified  Skin:  Wound (see comment) (stg III sacrum, L ear; stg II buttocks; stg I heels)  Last BM:  8/8  Height:   Ht Readings from Last 1 Encounters:  08/01/17 5\' 3"  (1.6 m)    Weight:   Wt Readings from Last 1 Encounters:  08/03/17 285 lb 0.9 oz (129.3 kg)    Ideal Body Weight:  52.3 kg  BMI:  Body mass index is 50.5 kg/m.  Estimated Nutritional Needs:   Kcal:  1900-2100  Protein:  115-130 gm  Fluid:  2.1 L  EDUCATION NEEDS:   No education needs identified at this time  Molli Barrows, [place], [place], [place] Pager [phone] After Hours Pager [phone]

## 2017-08-03 NOTE — Progress Notes (Signed)
PULMONARY / CRITICAL CARE MEDICINE   Name: Katherine Cooper MRN: [id number] DOB: [birth year]    ADMISSION DATE:  08/10/2017 CONSULTATION DATE:  07/25/2017  REFERRING MD:  Dr. Ralene Bathe   CHIEF COMPLAINT:  Sepsis   HISTORY OF PRESENT ILLNESS:  76 y.o. female with PMH of Alzheimer dementia, Anemia, Breast CA, CKD, GERD, HTN, Hypothyroid, CVA. Presents to ED on 8/7 with progressive lethargy. Family reports that patient has been at [facility] since February 2018 and since has been bed bound however, alert and verbal and confused. Patient has stage 3 sacral pressure ulcer (which looks clean). For the last few days reportedly has been non-verbal and moaning. Upon arrival patient was hypoglycemic (glucose 49) and hypotensive (84/69). WBC 18.7, LA 2.10, administered 3L NS and started on Levophed gtt. CT head negative for acute process. PCCM asked to admit.   SUBJECTIVE:  Potassium replaced this morning. Still with marginal urine output and requiring vasopressors. No acute events overnight.   REVIEW OF SYSTEMS:  Unable to obtain with encephalopathy & baseline dementia.   VITAL SIGNS: BP 93/63   Pulse (!) 125   Temp (!) 97.5 F (36.4 C) (Oral)   Resp 14   Ht 5\' 3"  (1.6 m)   Wt 285 lb 0.9 oz (129.3 kg)   SpO2 100%   BMI 50.50 kg/m   HEMODYNAMICS:    VENTILATOR SETTINGS:    INTAKE / OUTPUT: I/O last 3 completed shifts: In: 4026.9 [I.V.:3866.9; Other:10; IV Piggyback:150] Out: 645 [Urine:645]  PHYSICAL EXAMINATION: General:  No family at bedside. Eyes open. Moaning intermittently.  Integument:  No rash. Warm. Dry. Bandage present on right ear.  HEENT:  No icterus. Moist membranes.  Cardiovascular:  Tachycardic. Regular rhythm. Sinus on telemetry. No JVD appreciated.  Pulmonary:  Normal work of breathing on room air. Good aeration bilaterally.  Abdomen: Protuberant. Soft. Normal bowel sounds.  Neurological:  Not following commands. Seems to attend to voice.   LABS:  BMET  Recent Labs Lab  08/01/17 0145 08/02/17 0808 08/03/17 0210  NA 138 134* 134*  K 4.6 3.6 3.2*  CL 117* 106 103  CO2 10* 15* 20*  BUN 63* 58* 56*  CREATININE 3.98* 3.74* 3.62*  GLUCOSE 141* 136* 114*    Electrolytes  Recent Labs Lab 08/01/17 0145 08/02/17 0808 08/03/17 0210  CALCIUM 9.0 8.4* 8.2*  MG 1.7 1.7 1.7  PHOS 3.5 3.7 3.6    CBC  Recent Labs Lab 08/02/17 0808 08/02/17 1653 08/03/17 0210  WBC 20.5* 21.3* 21.2*  HGB 8.9* 8.8* 8.6*  HCT 26.9* 26.8* 26.3*  PLT 217 196 189    Coag's  Recent Labs Lab 08/01/17 0429 08/02/17 0808 08/03/17 0210  APTT  --   --  40*  INR 2.14 1.49 1.49    Sepsis Markers  Recent Labs Lab 08/01/17 0100 08/01/17 0145 08/01/17 0400 08/01/17 2314 08/02/17 0808  LATICACIDVEN 2.5*  --  4.1* 1.4  --   PROCALCITON  --  13.42  --   --  12.08    ABG  Recent Labs Lab 08/01/17 0808 08/01/17 1640 08/02/17 0343  PHART 7.301* 7.405 7.392  PCO2ART 22.2* 21.8* 26.2*  PO2ART 101 87.0 93.6    Liver Enzymes  Recent Labs Lab 08/19/2017 1721 08/02/17 0808 08/03/17 0210  AST 52* 37 39  ALT 60* 83* 89*  ALKPHOS 165* 179* 166*  BILITOT 1.8* 1.0 0.8  ALBUMIN 1.4* 1.5* 1.4*    Cardiac Enzymes No results for input(s): TROPONINI, PROBNP in the last 168  hours.  Glucose  Recent Labs Lab 08/02/17 0749 08/02/17 1200 08/02/17 1556 08/02/17 1938 08/02/17 2318 08/03/17 0737  GLUCAP 127* 120* 114* 109* 113* 100*    Imaging No results found.   STUDIES:  CT HEAD W/O 8/7:  No acute findings.  Stable mild atrophy and small vessel disease. CT RENAL STONE STUDY 8/7:  Large amount of stool within the rectum as can be seen with impaction. Moderate hiatal hernia. Hepatic steatosis. High attenuation within the gallbladder lumen most suggestive of stones/sludge. RENAL U/S 8/7:  Both kidneys demonstrate mild cortical thinning. No hydronephrosis identified. PORT CXR 8/8:  Previously reviewed by me. Left internal jugular central venous catheter in  good position. No parenchymal opacity or mass appreciated. No pleural effusion. TTE 8/9:  LV normal in size w/ EF 65-70% & no regional wall motion abnormalities. LA & RA normal in size. RV normal in size & function. AV w/ moderate regurgitation & mild stenosis. Aortic root normal in size. MV w/ trivial regurg without stenosis. Mobile echodensity on mitral valve leaflet. PV with trivial regurg & no stenosis. Mild tricuspid valve regurg. No pericardial effusion.   MICROBIOLOGY: MRSA PCR 8/8:  Positive Blood Cultures x2 8/7 >>> 1/2 Bottles Positive MRSA Urine Ctx 8/7 (from SNF Foley) >>> Multiple Species Present Blood Cultures x2 8/8 >>> 1/2 bottles GPC Urine Culture 8/9 >>>  ANTIBIOTICS: Zosyn 8/7 (x1 dose) Meropenem 8/7 (x1 dose) Vancomycin 8/7 >>>  SIGNIFICANT EVENTS: 8/07 - Admit   LINES/TUBES: L IJ CVL 8/7 >>> R RAD ART LINE 8/8 >>> FOLEY (from Facility) Replaced 8/8 >>>  ASSESSMENT / PLAN:  PULMONARY A: Acute hypoxic respiratory failure: Likely secondary to atelectasis. Resolved. P:   Close monitoring given risk of intubation Continuous pulse oximetry monitoring  CARDIOVASCULAR A:  Mitral Valve Vegetation:  Suggested on TTE 8/9. Sinus Tachycardia:  Confirmed with EKG 8/9.  Shock: Secondary to sepsis. Stable.  History of essential hypertension  P:  Weaning vasopressors for MAP >65 & SBP >90 Continue telemetry monitoring Vital signs per unit protocol  RENAL A:   Acute on chronic renal failure stage IV:  Improving but urine output still oliguric. Metabolic/lactic acidosis:  Resolving. Hyponatremia:  Mild.  Hyperchloremia:  Resolved.  Hypokalemia:  Replaced.  P:   Off Bicarb drip KCl 68mEq IV x4 runs today Trending electrolytes daily & renal function  GASTROINTESTINAL A:   Transaminitis:  Mild. Stable to mild improvement.  GIB:  Normal bowel movements after impaction.  Impacted Stool:  S/P disimpaction.  Failure to Thrive GERD  P:   NPO Continuing  Protonix IV q12hr GI consulted previously Dietician consulted for tube feedings Trending LFTs daily   HEMATOLOGIC A:   Anemia:  Multifactorial from blood loss & chronic disease. Hgb Stable.  Coagulopathy:  S/P FFP & Vitamin K 5mg  IV. Stable. Leukocytosis:  Secondary to sepsis. Stable.  P:  Starting Heparin [place] q8hr SCDs Trending Hgb q12hr w/ CBC  INFECTIOUS A:   MRSA Bacteremia & Endocarditis:  Repeat cultures 8/8 positive. Mitral Vegetation suggested on TTE 8/9. Sepsis:  H/O ESBL Klebsiella UTI (04/2017) and MRSA bacteremia (04/2017). Possible Osteomyelitis:  Stage 3 sacral decub.  Possible UTI:  Cultures performed from prior SNF Foley before changing. Repeat culture pending.   P:   ID following & appreciate recommendations Empiric antibiotics as above Repeat Cultures pending Trending Procalcitonin per algorithm   ENDOCRINE A:   Hypoglycemia:  Improved.    P:   Accu-Checks q4hr w/ MD notification parameters.  NEUROLOGIC A:  Acute Encephalopathy:  Likely due to toxic metabolic. H/O Alzheimer's Dementia   P:   Avoiding sedating medications.    FAMILY  - Updates:  Family last updated 8/9 by Dr. Ashok Cordia. No family present during rounds 38/10.   - Inter-disciplinary family meet or Palliative Care meeting due by: 08/07/2017  DISCUSSION:  76 y.o. female with septic shock from bacteremia. Still concerned about a UTI given U/A and history of organisms. Appreciate assistance from Palliative Medicine & ID consultants. Suggestion of mitral vegetation on TTE but TEE may be too risky for the patient in her current clinical state.   I have spent an additional total of 33 minutes of critical care time today caring for the patient and reviewing the patient's electronic medical record.   Sonia Baller Ashok Cordia, M.D. [facility] Pulmonary & Critical Care Pager:  [phone] After 3pm or if no response, call [phone] 11:00 AM 08/03/17

## 2017-08-03 NOTE — Consult Note (Signed)
Consultation Note Date: 08/03/2017   Patient Name: Katherine Cooper  DOB: 1941/04/14  MRN: [id number]  Age / Sex: 76 y.o., female  PCP: Nolene Ebbs, MD Referring Physician: Javier Glazier, MD  Reason for Consultation: Establishing goals of care  HPI/Patient Profile: 76 y.o. female  with past medical history of alzheimers dementia, CVA, BRCA, recurrent UTIs from indwelling foley, who was admitted on 08/20/2017 with progressive lethargy.  She was found to have septic shock secondary to MRSA bacteremia. On 8/9 a TTE showed probable vegetation on her mitral valve - likely endocarditis.  Unfortunately she has acute renal failure as well with a GFR of 13.    Clinical Assessment and Goals of Care:  I have reviewed medical records including EPIC notes, labs and imaging, received report from her beside RN, assessed the patient and then met at the bedside along with her daughter [name] to discuss diagnosis prognosis, [place], EOL wishes, disposition and options.  I introduced Palliative Medicine as specialized medical care for people living with serious illness. It focuses on providing relief from the symptoms and stress of a serious illness. The goal is to improve quality of life for both the patient and the family.  The patient has no HCPOA, and has 4 adult children.  [name] relates that she spends the most time with her mother and understands that she is suffering.  The other 3 children are unwilling to accept their mother's condition.  [name] mentions that 3 weeks ago her mother's appetite became very poor, but she was still very conversant.  This past weekend the patient was moaning in pain stating that she was so tired she did not want to live like this any more.  She was complaining about pain in her bottom and generalized pain as well.   By Tuesday [name] said her mother wasn't conversing anymore she was just moaning and  hollering out in pain.  [name] and I discussed the endocarditis, the bacteremia, and the kidney failure.  She understands that her mother is very near end of life.    We discussed code status.  Unfortunately [name]'s siblings are not in agreement with her about code status and want full code.  [name] and I discussed the high likelihood that her mother will be placed on life support and that she and her siblings will have to make the difficult decision to take her off.    I asked [name] what I could do for her and she responded - get my mother a bed at [facility].  I explained that right now her mother would not survive leaving the hospital.  Further, I explained that we could start comfort measures and move her to 6N but she would have to be a DNR.  If we started comfort measures she would most likely die in the hospital.   [name] understood and stated that she would talk to her siblings.  Questions and concerns were addressed. The family was encouraged to call with questions or concerns.    Primary  Decision Maker:  NEXT OF KIN:  54 of adult children (there are 4)    SUMMARY OF RECOMMENDATIONS    [place] meeting is needed with the majority of the adult children.  PMT will work schedule a family meeting.  Code Status/Advance Care Planning:  Full code   Symptom Management:   Per primary team - full code  Will add lidocaine patch near sacral decub  Prognosis:   Hours to days given endocarditis, bacteremia, kidney failure.  Discharge Planning: Anticipated Hospital Death vs Hospice House.      Primary Diagnoses: Present on Admission: . Sepsis ([place])   I have reviewed the medical record, interviewed the patient and family, and examined the patient. The following aspects are pertinent.  Past Medical History:  Diagnosis Date  . Alzheimer disease   . Alzheimer's dementia, late onset, with behavioral disturbance 02/06/2017  . Anemia   . Angina   . Anxiety   . Arthritis   .  Blood transfusion   . Breast cancer ([place]) 10/22/2012  . CAP (community acquired pneumonia)   . Chronic kidney disease   . DDD (degenerative disc disease), lumbar   . Dementia   . Dysrhythmia   . GERD without esophagitis 05/28/2017  . Gout   . Headache(784.0)   . Heart murmur   . Hypertension   . Hypothyroidism   . Obesity   . Recurrent upper respiratory infection (URI)   . Shortness of breath 05/26/2013  . Stroke [facility])    Social History   Social History  . Marital status: Widowed    Spouse name: N/A  . Number of children: N/A  . Years of education: N/A   Social History Main Topics  . Smoking status: Former Smoker    Packs/day: 0.25    Types: Cigarettes    Quit date: 03/13/2012  . Smokeless tobacco: Never Used  . Alcohol use No  . Drug use: No  . Sexual activity: No   Other Topics Concern  . None   Social History Narrative  . None   Family History  Problem Relation Age of Onset  . Asthma Mother   . Heart attack Father   . Kidney failure Brother    Scheduled Meds: . chlorhexidine  15 mL Mouth Rinse BID  . Chlorhexidine Gluconate Cloth  6 each Topical Q0600  . Chlorhexidine Gluconate Cloth  6 each Topical Daily  . heparin subcutaneous  5,000 Units Subcutaneous Q8H  . mouth rinse  15 mL Mouth Rinse q12n4p  . mupirocin ointment  1 application Nasal BID  . pantoprazole (PROTONIX) IV  40 mg Intravenous Q12H   Continuous Infusions: . sodium chloride 250 mL (08/03/17 1000)  . sodium chloride    . sodium chloride    . sodium chloride    . norepinephrine (LEVOPHED) Adult infusion 16 mcg/min (08/03/17 1000)  . potassium chloride    . vancomycin Stopped (08/03/17 0327)  . vasopressin (PITRESSIN) infusion - *FOR SHOCK* 0.04 Units/min (08/03/17 1000)   PRN Meds:.sodium chloride, Place/Maintain arterial line **AND** sodium chloride, pneumococcal 23 valent vaccine, sodium chloride flush Allergies  Allergen Reactions  . Sulfa Antibiotics Itching   Review of Systems  patient can only moan  Physical Exam  Demented female.  Moaning.  Opens eyes to look at me but does not respond to me CV tachy resp no distress Abdomen soft, nt LE warm to touch in SCDs and Prevlon boots  Vital Signs: BP 93/63   Pulse (!) 124   Temp 98.2  F (36.8 C) (Axillary)   Resp 14   Ht _0  (1.6 m)   Wt 129.3 kg (285 lb 0.9 oz)   SpO2 97%   BMI 50.50 kg/m  Pain Assessment: PAINAD       SpO2: SpO2: 97 % O2 Device:SpO2: 97 % O2 Flow Rate: .O2 Flow Rate (L/min): 15 L/min  IO: Intake/output summary:  Intake/Output Summary (Last 24 hours) at 08/03/17 1231 Last data filed at 08/03/17 1200  Gross per 24 hour  Intake          2174.43 ml  Output              360 ml  Net          1814.43 ml    LBM: Last BM Date: 08/01/17 Baseline Weight: Weight: 77.2 kg (170 lb 3.1 oz) Most recent weight: Weight: 129.3 kg (285 lb 0.9 oz)     Palliative Assessment/Data:   Flowsheet Rows     Most Recent Value  Intake Tab  Referral Department  Critical care  Unit at Time of Referral  ICU  Palliative Care Primary Diagnosis  Sepsis/Infectious Disease  Date Notified  08/02/17  Palliative Care Type  Return patient Palliative Care  Reason for referral  Clarify Goals of Care  Date of Admission  08/01/2017  Date first seen by Palliative Care  08/03/17  # of days Palliative referral response time  1 Day(s)  # of days IP prior to Palliative referral  2  Clinical Assessment  Palliative Performance Scale Score  20%  Psychosocial & Spiritual Assessment  Palliative Care Outcomes      Time In: 1:00 Time Out: 2:20 Time Total: 80 min. Greater than 50%  of this time was spent counseling and coordinating care related to the above assessment and plan.  Signed by: Florentina Jenny, PA-C Palliative Medicine Pager: [phone]  Please contact Palliative Medicine Team phone at [phone] for questions and concerns.  For individual provider: See Shea Evans

## 2017-08-03 NOTE — Evaluation (Signed)
Clinical/Bedside Swallow Evaluation Patient Details  Name: Katherine Cooper MRN: [id number] Date of Birth: 08/12/1941  Today's Date: 08/03/2017 Time: SLP Start Time (ACUTE ONLY): 1419 SLP Stop Time (ACUTE ONLY): 1433 SLP Time Calculation (min) (ACUTE ONLY): 14 min  Past Medical History:  Past Medical History:  Diagnosis Date  . Alzheimer disease   . Alzheimer's dementia, late onset, with behavioral disturbance 02/06/2017  . Anemia   . Angina   . Anxiety   . Arthritis   . Blood transfusion   . Breast cancer ([place]) 10/22/2012  . CAP (community acquired pneumonia)   . Chronic kidney disease   . DDD (degenerative disc disease), lumbar   . Dementia   . Dysrhythmia   . GERD without esophagitis 05/28/2017  . Gout   . Headache(784.0)   . Heart murmur   . Hypertension   . Hypothyroidism   . Obesity   . Recurrent upper respiratory infection (URI)   . Shortness of breath 05/26/2013  . Stroke [facility])    Past Surgical History:  Past Surgical History:  Procedure Laterality Date  . UTERINE FIBROID SURGERY     HPI:  76 y.o.female with PMH of Alzheimer dementia, Anemia, Breast CA, CKD, GERD, HTN, Hypothyroid, CVA. Presents to ED on 8/7 with progressive lethargy.Head CT negative. Dx with acute toxic metabolic encephalopathy, bacteremia, respiratory failure secondary atelectasis, renal failure. Patient seen by SLP during 2013 admission in which she was noted to have a suspected primary esophageal dysphagia with recommendations for a regular diet. Notes from 04/2017 state that patient downgraded to pureed diet due to reported difficulty with solids.    Assessment / Plan / Recommendation Clinical Impression  Swallow evaluation complete at bedside. Mentation is primary barrier to initiation of pos at this time. Despite max cueing, patient demonstrates no awareness of bolus with no oral opening for acceptance of bolus. Education complete with daughter who was present for exam. SLP will continue to f/u  at bedside for po readiness.  SLP Visit Diagnosis: Dysphagia, unspecified (R13.10)    Aspiration Risk       Diet Recommendation NPO;Alternative means - temporary   Medication Administration: Via alternative means    Other  Recommendations Oral Care Recommendations: Oral care QID   Follow up Recommendations Skilled Nursing facility      Frequency and Duration min 2x/week  2 weeks       Prognosis Prognosis for Safe Diet Advancement: Fair Barriers to Reach Goals: Cognitive deficits      Swallow Study   General HPI: 76 y.o.female with PMH of Alzheimer dementia, Anemia, Breast CA, CKD, GERD, HTN, Hypothyroid, CVA. Presents to ED on 8/7 with progressive lethargy.Head CT negative. Dx with acute toxic metabolic encephalopathy, bacteremia, respiratory failure secondary atelectasis, renal failure. Patient seen by SLP during 2013 admission in which she was noted to have a suspected primary esophageal dysphagia with recommendations for a regular diet. Notes from 04/2017 state that patient downgraded to pureed diet due to reported difficulty with solids.  Type of Study: Bedside Swallow Evaluation Previous Swallow Assessment: see HPI Diet Prior to this Study: NPO Temperature Spikes Noted: No Respiratory Status: Nasal cannula History of Recent Intubation: No Behavior/Cognition: Doesn't follow directions;Requires cueing;Lethargic/Drowsy Oral Cavity Assessment: Within Functional Limits Oral Care Completed by SLP: Yes Oral Cavity - Dentition: Dentures, not available Self-Feeding Abilities: Total assist Patient Positioning: Upright in bed Baseline Vocal Quality: Not observed Volitional Cough: Cognitively unable to elicit Volitional Swallow: Unable to elicit    Oral/Motor/Sensory Function Overall Oral  Motor/Sensory Function: Other (comment) (unable to assess due to mentation)   Ice Chips Ice chips: Impaired Presentation: Spoon Oral Phase Impairments: Poor awareness of bolus   Thin Liquid  Thin Liquid: Not tested    Nectar Thick Nectar Thick Liquid: Not tested   Honey Thick Honey Thick Liquid: Not tested   Puree Puree: Not tested   Solid   Katherine Harvie MA, CCC-SLP [phone]  Solid: Not tested        Miachel Nardelli [name] 08/03/2017,2:38 PM

## 2017-08-03 NOTE — Progress Notes (Signed)
CRITICAL VALUE ALERT  Critical Value:  Lactic acid 3.1  Date & Time Notied:  08/03/2017 23:30  Provider Notified: Fatima Blank.  Orders Received/Actions taken: Patient currently receiving 1L NS bolus.

## 2017-08-03 NOTE — Progress Notes (Addendum)
   Patient Name: Katherine Cooper Date of Encounter: 08/03/2017, 11:03 PM    Subjective  Hematochezia - started around 2011 - asked to see back by PCC<M   Objective  BP 90/71   Pulse (!) 125   Temp 98.2 F (36.8 C) (Axillary)   Resp 13   Ht 5\' 3"  (1.6 m)   Wt 285 lb 0.9 oz (129.3 kg)   SpO2 94%   BMI 50.50 kg/m  Moaning in pain  Critically ill abd soft NT Rectal - maroon liquid stool - blood, no mass Skin - bandaged sacral decubitis  CBC Latest Ref Rng & Units 08/03/2017 08/03/2017 08/03/2017  WBC 4.0 - 10.5 K/uL 20.0(H) 22.2(H) 21.2(H)  Hemoglobin 12.0 - 15.0 g/dL 8.3(L) 8.8(L) 8.6(L)  Hematocrit 36.0 - 46.0 % 25.5(L) 27.2(L) 26.3(L)  Platelets 150 - 400 K/uL 193 194 189    Lab Results  Component Value Date   INR 1.47 08/03/2017   INR 1.49 08/03/2017   INR 1.49 08/02/2017       Assessment and Plan  Acute lower GI bleed in critically ill patient with endocarditis, AKIsepsis MSOF  Hypotension/shock - worsened by GI bleeding  Was impacted on admission  Wide range of possibilities, diverticulosis, ischemia, stercoral ulcer of rectum  I think best we can do is supportive care at this point  1L NS bolus  Will transfuse 2 U RBC  If INR increases more can give FFP  Will f/u tomorrow  Her prognosis is terrible and is now worse with this bleeding  Gatha Mayer, MD, [facility] [phone] (pager) 08/03/2017 11:03 PM

## 2017-08-03 NOTE — Progress Notes (Signed)
[facility] Progress Note Patient Name: Katherine Cooper DOB: 11-Apr-1941 MRN: [id number]   Date of Service  08/03/2017  HPI/Events of Note  Called by bedside RN with lower GI bleed and hypotension on neo  eICU Interventions  Asked Dr. Phylliss Bob to evaluate and levophed, CBC and INR orders entered and chart reviewed.  If Dr. Jimmey Ralph feels GI needs to be called back then will call.        YACOUB,WESAM 08/03/2017, 10:07 PM

## 2017-08-03 NOTE — Progress Notes (Signed)
      INFECTIOUS DISEASE ATTENDING ADDENDUM:   Date: 08/03/2017  Patient name: [place] record number: [id number]  Date of birth: [birth year]    This patient has been seen and discussed with the house staff. Please see the resident's note for complete details. I concur with their findings with the following additions/corrections:  Patient still on pressors. She does not follow commands. Her transthoracic echocardiogram shows what appears to be a vegetation on the mitral valve.  Continue vancomycin   Prognosis remains poor and she already has poor quality of life.  I would reiterate that I do not think she will survive this hospitalization.   Rhina Brackett [place] 08/03/2017, 4:15 PM

## 2017-08-03 NOTE — Progress Notes (Signed)
Cortrak Tube Team Note:  Consult received to place a Cortrak feeding tube.   A 10 F Cortrak tube was placed in the L nare and secured with a nasal bridle at 76 cm. Per the Cortrak monitor reading the tube tip is post pyloric.   X-ray ordered due to abnormal tracing likely due to obeisty, abdominal x-ray has been ordered by the Cortrak team. Please confirm tube placement before using the Cortrak tube.   If the tube becomes dislodged please keep the tube and contact the Cortrak team at [email or web address] (password [email or web address]) for replacement.  If after hours and replacement cannot be delayed, place a NG tube and confirm placement with an abdominal x-ray.    [place], [place], [place] Pager [phone] After Hours Pager

## 2017-08-04 DIAGNOSIS — I38 Endocarditis, valve unspecified: Secondary | ICD-10-CM

## 2017-08-04 DIAGNOSIS — N171 Acute kidney failure with acute cortical necrosis: Secondary | ICD-10-CM

## 2017-08-04 DIAGNOSIS — R6520 Severe sepsis without septic shock: Secondary | ICD-10-CM

## 2017-08-04 DIAGNOSIS — D62 Acute posthemorrhagic anemia: Secondary | ICD-10-CM

## 2017-08-04 LAB — GLUCOSE, CAPILLARY
GLUCOSE-CAPILLARY: 185 mg/dL — AB (ref 65–99)
GLUCOSE-CAPILLARY: 149 mg/dL — AB (ref 65–99)
GLUCOSE-CAPILLARY: 144 mg/dL — AB (ref 65–99)
GLUCOSE-CAPILLARY: 144 mg/dL — AB (ref 65–99)

## 2017-08-04 LAB — COMPREHENSIVE METABOLIC PANEL
ALT: 102 U/L — ABNORMAL HIGH (ref 14–54)
ANION GAP: 16 — AB (ref 5–15)
AST: 115 U/L — ABNORMAL HIGH (ref 15–41)
Albumin: 1.4 g/dL — ABNORMAL LOW (ref 3.5–5.0)
Alkaline Phosphatase: 160 U/L — ABNORMAL HIGH (ref 38–126)
BUN: 54 mg/dL — ABNORMAL HIGH (ref 6–20)
CHLORIDE: 106 mmol/L (ref 101–111)
CO2: 12 mmol/L — AB (ref 22–32)
Calcium: 8.4 mg/dL — ABNORMAL LOW (ref 8.9–10.3)
Creatinine, Ser: 3.32 mg/dL — ABNORMAL HIGH (ref 0.44–1.00)
GFR calc non Af Amer: 12 mL/min — ABNORMAL LOW (ref >60)
GFR, EST AFRICAN AMERICAN: 14 mL/min — AB (ref >60)
Glucose, Bld: 207 mg/dL — ABNORMAL HIGH (ref 65–99)
POTASSIUM: 3.7 mmol/L (ref 3.5–5.1)
SODIUM: 134 mmol/L — AB (ref 135–145)
Total Bilirubin: 1 mg/dL (ref 0.3–1.2)
Total Protein: 3.9 g/dL — ABNORMAL LOW (ref 6.5–8.1)

## 2017-08-04 LAB — PROCALCITONIN: PROCALCITONIN: 3.78 ng/mL

## 2017-08-04 LAB — CBC WITH DIFFERENTIAL/PLATELET
BASOS ABS: 0 10*3/uL (ref 0.0–0.1)
Basophils Relative: 0 %
EOS PCT: 0 %
Eosinophils Absolute: 0 10*3/uL (ref 0.0–0.7)
HCT: 34.6 % — ABNORMAL LOW (ref 36.0–46.0)
HEMOGLOBIN: 11.6 g/dL — AB (ref 12.0–15.0)
LYMPHS ABS: 1.5 10*3/uL (ref 0.7–4.0)
LYMPHS PCT: 7 %
MCH: 29.6 pg (ref 26.0–34.0)
MCHC: 33.5 g/dL (ref 30.0–36.0)
MCV: 88.3 fL (ref 78.0–100.0)
MONO ABS: 0.5 10*3/uL (ref 0.1–1.0)
Monocytes Relative: 2 %
NEUTROS ABS: 18.2 10*3/uL — AB (ref 1.7–7.7)
Neutrophils Relative %: 90 %
PLATELETS: 135 10*3/uL — AB (ref 150–400)
RBC: 3.92 MIL/uL (ref 3.87–5.11)
RDW: 17.6 % — AB (ref 11.5–15.5)
WBC: 20.2 10*3/uL — ABNORMAL HIGH (ref 4.0–10.5)

## 2017-08-04 LAB — LACTIC ACID, PLASMA
LACTIC ACID, VENOUS: 8.9 mmol/L — AB (ref 0.5–1.9)
LACTIC ACID, VENOUS: 5.9 mmol/L — AB (ref 0.5–1.9)
Lactic Acid, Venous: 3.8 mmol/L (ref 0.5–1.9)

## 2017-08-04 LAB — FIBRINOGEN
FIBRINOGEN: 155 mg/dL — AB (ref 210–475)
Fibrinogen: 83 mg/dL — CL (ref 210–475)

## 2017-08-04 LAB — PROTIME-INR
INR: 1.4
PROTHROMBIN TIME: 17.3 s — AB (ref 11.4–15.2)

## 2017-08-04 LAB — PHOSPHORUS: PHOSPHORUS: 4.8 mg/dL — AB (ref 2.5–4.6)

## 2017-08-04 LAB — APTT: APTT: 40 s — AB (ref 24–36)

## 2017-08-04 LAB — HEMOGLOBIN AND HEMATOCRIT, BLOOD
HCT: 31.8 % — ABNORMAL LOW (ref 36.0–46.0)
Hemoglobin: 10.8 g/dL — ABNORMAL LOW (ref 12.0–15.0)

## 2017-08-04 LAB — MAGNESIUM: MAGNESIUM: 1.5 mg/dL — AB (ref 1.7–2.4)

## 2017-08-04 MED ORDER — SODIUM CHLORIDE 0.9 % IV SOLN
Freq: Once | INTRAVENOUS | Status: AC
  Administered 2017-08-04: 13:00:00 via INTRAVENOUS

## 2017-08-04 MED ORDER — SODIUM CHLORIDE 0.9 % IV SOLN
1750.0000 mg | INTRAVENOUS | Status: DC
  Administered 2017-08-04: 1750 mg via INTRAVENOUS
  Filled 2017-08-04: qty 1750

## 2017-08-04 MED ORDER — SODIUM CHLORIDE 0.9 % IV BOLUS (SEPSIS)
500.0000 mL | INTRAVENOUS | Status: DC | PRN
  Administered 2017-08-04: 500 mL via INTRAVENOUS

## 2017-08-04 NOTE — Progress Notes (Signed)
   Patient Name: Blondell Laperle Date of Encounter: 08/04/2017, 8:56 AM    Subjective  Bleeding stopped since last night   Objective  BP 98/83 (BP Location: Left Arm)   Pulse (!) 142   Temp 97.6 F (36.4 C) (Oral)   Resp 13   Ht 5\' 3"  (1.6 m)   Wt 292 lb 5.3 oz (132.6 kg)   SpO2 100%   BMI 51.78 kg/m  Critically ill - somnolent  CBC Latest Ref Rng & Units 08/04/2017 08/03/2017 08/03/2017  WBC 4.0 - 10.5 K/uL 20.2(H) 20.0(H) 22.2(H)  Hemoglobin 12.0 - 15.0 g/dL 11.6(L) 8.3(L) 8.8(L)  Hematocrit 36.0 - 46.0 % 34.6(L) 25.5(L) 27.2(L)  Platelets 150 - 400 K/uL 135(L) 193 194   Lab Results  Component Value Date   INR 1.47 08/03/2017   INR 1.49 08/03/2017   INR 1.49 08/02/2017     Assessment and Plan  Acute lower GI bleed with acute blood loss anemia  MSOF Endocarditis   Poor prognosis Not a good candidate for endoscopy - would not likely tolerate sedation and could need intubation and vent - not desired Bleeding currently stopped Will f/u If we need to do something would start with unsedated flex sig    Gatha Mayer, MD, [facility] [phone] (pager) 08/04/2017 8:56 AM

## 2017-08-04 NOTE — Progress Notes (Signed)
CRITICAL VALUE ALERT  Critical Value:  Fibrinogen 83  Date & Time Notied:  08/03/2017 @ 23:00  Provider Notified: Hammonds, K.  Orders Received/Actions taken: NA

## 2017-08-04 NOTE — Progress Notes (Signed)
CRITICAL VALUE ALERT  Critical Value:  Lactic acid 8.9  Date & Time Notied:  08/04/2017 @ 02:43  Provider Notified: Fatima Blank.  Orders Received/Actions taken: NA

## 2017-08-04 NOTE — Progress Notes (Signed)
Pharmacy Antibiotic Note  Katherine Cooper is a 76 y.o. female admitted on 08/07/2017 with bacteremia and sepsis. She presented to the ED on 08/07 with progressive lethargy and has been at a SNF since 01/2017 and bed bound. Overnight her vasopressor requirements have increased and stress dosed steroids were restarted. Blood cultures on 08/07 showed MRSA and on 8/8 blood cultures showed CONS.  Today I increased the vancomycin dose to reflect her body weight and renal function for coverage of the MRSA. The original dose was 1000mg  Q48hrs which was last dosed on 08/10 at 0227, so I also adjusted the time for the next dose.    Plan: Increase vancomycin to 1750 mg Q48hrs Adjusted the next dose to start today at 1430 which is 36 hours post previous dose Follow troughs as indicated, LOT, clinical status, renal function and cultures  Height: 5\' 3"  (160 cm) Weight: 292 lb 5.3 oz (132.6 kg) IBW/kg (Calculated) : 52.4  Temp (24hrs), Avg:98.2 F (36.8 C), Min:97.5 F (36.4 C), Max:98.8 F (37.1 C)   Recent Labs Lab 08/12/2017 1730  08/01/17 0100 08/01/17 0145 08/01/17 0400  08/01/17 2314 08/02/17 0808 08/02/17 1653 08/03/17 0210 08/03/17 1717 08/03/17 2200 08/03/17 2229 08/04/17 0129 08/04/17 0750  WBC  --   --   --  18.8*  --   < >  --  20.5* 21.3* 21.2* 22.2* 20.0*  --   --  20.2*  CREATININE 4.80*  --   --  3.98*  --   --   --  3.74*  --  3.62*  --   --   --   --  3.32*  LATICACIDVEN  --   < > 2.5*  --  4.1*  --  1.4  --   --   --   --   --  3.1* 8.9*  --   < > = values in this interval not displayed.  Estimated Creatinine Clearance: 19.2 mL/min (A) (by C-G formula based on SCr of 3.32 mg/dL (H)).    Allergies  Allergen Reactions  . Sulfa Antibiotics Itching   Antimicrobials this admission: Meropenem 8/7 x 1 dose Zosyn 8/7 >> 8/7  Vancomycin 8/7 x 1 dose  Vancomycin 8/8>>  Dose adjustments this admission: 08/11 Increased vancomycin from 1000 mg Q48h to 1750 mg  Q48h  Microbiology results: 8/8 MRSA PCR: positive 8/8 UCx: multiple species present from SNF [place]  8/7 BCx x2: 1/2 bottles MRSA  8/8 Bcx x2: 1/2 bottles Coag neg staph 8/9 Ucx: NG Final    Patterson Hammersmith PharmD PGY1 Pharmacy Practice Resident 08/04/2017 11:47 AM Pager: [phone]

## 2017-08-04 NOTE — Progress Notes (Signed)
SLP Cancellation Note  Patient Details Name: Katherine Cooper MRN: [id number] DOB: 10-23-1941   Cancelled treatment:       Reason Eval/Treat Not Completed: Patient's level of consciousness. Will f/u next date for improvements in alertness.  Deneise Lever, [place], [place] Speech-Language Pathologist [place] 08/04/2017, 3:30 PM

## 2017-08-04 NOTE — Progress Notes (Signed)
Subjective:  Patient is obtunded  Antibiotics:  Anti-infectives    Start     Dose/Rate Route Frequency Ordered Stop   08/04/17 1430  vancomycin (VANCOCIN) 1,750 mg in sodium chloride 0.9 % 500 mL IVPB     1,750 mg 250 mL/hr over 120 Minutes Intravenous Every 48 hours 08/04/17 1137     08/03/17 0300  vancomycin (VANCOCIN) IVPB 1000 mg/200 mL premix  Status:  Discontinued     1,000 mg 200 mL/hr over 60 Minutes Intravenous Every 48 hours 08/01/17 1056 08/04/17 1137   07/30/2017 2359  meropenem (MERREM) 1 g in sodium chloride 0.9 % 100 mL IVPB  Status:  Discontinued     1 g 200 mL/hr over 30 Minutes Intravenous Daily at bedtime 08/11/2017 2321 08/01/17 1211   08/22/2017 2330  vancomycin (VANCOCIN) 1,250 mg in sodium chloride 0.9 % 250 mL IVPB     1,250 mg 166.7 mL/hr over 90 Minutes Intravenous  Once 08/24/2017 2321 08/01/17 0441   08/14/2017 1700  piperacillin-tazobactam (ZOSYN) IVPB 3.375 g     3.375 g 100 mL/hr over 30 Minutes Intravenous  Once 08/05/2017 1646 08/15/2017 1836      Medications: Scheduled Meds: . chlorhexidine  15 mL Mouth Rinse BID  . Chlorhexidine Gluconate Cloth  6 each Topical Daily  . Chlorhexidine Gluconate Cloth  6 each Topical Daily  . feeding supplement (PRO-STAT SUGAR FREE 64)  30 mL Per Tube TID  . hydrocortisone sod succinate (SOLU-CORTEF) inj  100 mg Intravenous Q8H  . mouth rinse  15 mL Mouth Rinse q12n4p  . mupirocin ointment  1 application Nasal BID  . pantoprazole (PROTONIX) IV  40 mg Intravenous Q12H   Continuous Infusions: . sodium chloride 250 mL (08/04/17 0500)  . sodium chloride    . feeding supplement (JEVITY 1.2 CAL) Stopped (08/03/17 2200)  . norepinephrine (LEVOPHED) Adult infusion 25 mcg/min (08/04/17 1500)  . phenylephrine (NEO-SYNEPHRINE) Adult infusion 25 mcg/min (08/04/17 1426)  . sodium chloride Stopped (08/04/17 0137)  . vancomycin 1,750 mg (08/04/17 1426)  . vasopressin (PITRESSIN) infusion - *FOR SHOCK* 0.04 Units/min  (08/04/17 0400)   PRN Meds:.sodium chloride, Place/Maintain arterial line **AND** sodium chloride, pneumococcal 23 valent vaccine, sodium chloride, sodium chloride flush    Objective: Weight change: 7 lb 4.4 oz (3.3 kg)  Intake/Output Summary (Last 24 hours) at 08/04/17 1549 Last data filed at 08/04/17 1500  Gross per 24 hour  Intake          6133.64 ml  Output              178 ml  Net          5955.64 ml   Blood pressure 90/78, pulse (!) 118, temperature 97.6 F (36.4 C), temperature source Oral, resp. rate 20, height 5\' 3"  (1.6 m), weight 292 lb 5.3 oz (132.6 kg), SpO2 100 %. Temp:  [97.2 F (36.2 C)-98.8 F (37.1 C)] 97.6 F (36.4 C) (08/11 1330) Pulse Rate:  [115-158] 118 (08/11 1500) Resp:  [12-29] 20 (08/11 1500) BP: (58-112)/(32-101) 90/78 (08/11 1500) SpO2:  [88 %-100 %] 100 % (08/11 1500) Arterial Line BP: (73-128)/(47-91) 90/61 (08/11 1500) Weight:  [292 lb 5.3 oz (132.6 kg)] 292 lb 5.3 oz (132.6 kg) (08/11 0100)  Physical Exam: General: Obtunded will make some noises response to stimuli but does not follow commands  CVS tachycardia rate, normal r,  no murmur rubs or gallops Chest: Rhonchi Abdomen: soft nontender, nondistended, normal bowel sounds, Extremities:  no  clubbing or edema noted bilaterally Skin: She has a wound over her ear and managing also significant decubitus ulcers have not examined Neuro: nonfocal  CBC:  CBC Latest Ref Rng & Units 08/04/2017 08/03/2017 08/03/2017  WBC 4.0 - 10.5 K/uL 20.2(H) 20.0(H) 22.2(H)  Hemoglobin 12.0 - 15.0 g/dL 11.6(L) 8.3(L) 8.8(L)  Hematocrit 36.0 - 46.0 % 34.6(L) 25.5(L) 27.2(L)  Platelets 150 - 400 K/uL 135(L) 193 194      BMET  Recent Labs  08/03/17 0210 08/04/17 0750  NA 134* 134*  K 3.2* 3.7  CL 103 106  CO2 20* 12*  GLUCOSE 114* 207*  BUN 56* 54*  CREATININE 3.62* 3.32*  CALCIUM 8.2* 8.4*     Liver Panel   Recent Labs  08/03/17 0210 08/04/17 0750  PROT 4.4* 3.9*  ALBUMIN 1.4* 1.4*  AST  39 115*  ALT 89* 102*  ALKPHOS 166* 160*  BILITOT 0.8 1.0       Sedimentation Rate No results for input(s): ESRSEDRATE in the last 72 hours. C-Reactive Protein No results for input(s): CRP in the last 72 hours.  Micro Results: Recent Results (from the past 720 hour(s))  Culture, blood (routine x 2)     Status: Abnormal   Collection Time: 08/14/2017  6:19 PM  Result Value Ref Range Status   Specimen Description BLOOD FEMORAL ARTERY VEIN  Final   Special Requests IN PEDIATRIC BOTTLE Blood Culture adequate volume  Final   Culture  Setup Time   Final    GRAM POSITIVE COCCI IN PEDIATRIC BOTTLE CRITICAL RESULT CALLED TO, READ BACK BY AND VERIFIED WITH: EVernona Rieger Pharm.D. 10:40 08/01/17 (wilsonm)    Culture METHICILLIN RESISTANT STAPHYLOCOCCUS AUREUS (A)  Final   Report Status 08/03/2017 FINAL  Final   Organism ID, Bacteria METHICILLIN RESISTANT STAPHYLOCOCCUS AUREUS  Final      Susceptibility   Methicillin resistant staphylococcus aureus - MIC*    CIPROFLOXACIN >=8 RESISTANT Resistant     ERYTHROMYCIN >=8 RESISTANT Resistant     GENTAMICIN <=0.5 SENSITIVE Sensitive     OXACILLIN >=4 RESISTANT Resistant     TETRACYCLINE <=1 SENSITIVE Sensitive     VANCOMYCIN <=0.5 SENSITIVE Sensitive     TRIMETH/SULFA >=320 RESISTANT Resistant     CLINDAMYCIN <=0.25 SENSITIVE Sensitive     RIFAMPIN <=0.5 SENSITIVE Sensitive     Inducible Clindamycin NEGATIVE Sensitive     * METHICILLIN RESISTANT STAPHYLOCOCCUS AUREUS  Blood Culture ID Panel (Reflexed)     Status: Abnormal   Collection Time: 08/12/2017  6:19 PM  Result Value Ref Range Status   Enterococcus species NOT DETECTED NOT DETECTED Final   Listeria monocytogenes NOT DETECTED NOT DETECTED Final   Staphylococcus species DETECTED (A) NOT DETECTED Final    Comment: CRITICAL RESULT CALLED TO, READ BACK BY AND VERIFIED WITH: E. Vernona Rieger Pharm.D. 10:40 08/01/17 (wilsonm)    Staphylococcus aureus DETECTED (A) NOT DETECTED Final    Comment:  Methicillin (oxacillin)-resistant Staphylococcus aureus (MRSA). MRSA is predictably resistant to beta-lactam antibiotics (except ceftaroline). Preferred therapy is vancomycin unless clinically contraindicated. Patient requires contact precautions if  hospitalized. CRITICAL RESULT CALLED TO, READ BACK BY AND VERIFIED WITH: E. Vernona Rieger Pharm.D. 10:40 08/01/17 (wilsonm)    Methicillin resistance DETECTED (A) NOT DETECTED Final    Comment: CRITICAL RESULT CALLED TO, READ BACK BY AND VERIFIED WITH: E. Vernona Rieger Pharm.D. 10:40 08/01/17 (wilsonm)    Streptococcus species NOT DETECTED NOT DETECTED Final   Streptococcus agalactiae NOT DETECTED NOT DETECTED Final   Streptococcus pneumoniae  NOT DETECTED NOT DETECTED Final   Streptococcus pyogenes NOT DETECTED NOT DETECTED Final   Acinetobacter baumannii NOT DETECTED NOT DETECTED Final   Enterobacteriaceae species NOT DETECTED NOT DETECTED Final   Enterobacter cloacae complex NOT DETECTED NOT DETECTED Final   Escherichia coli NOT DETECTED NOT DETECTED Final   Klebsiella oxytoca NOT DETECTED NOT DETECTED Final   Klebsiella pneumoniae NOT DETECTED NOT DETECTED Final   Proteus species NOT DETECTED NOT DETECTED Final   Serratia marcescens NOT DETECTED NOT DETECTED Final   Haemophilus influenzae NOT DETECTED NOT DETECTED Final   Neisseria meningitidis NOT DETECTED NOT DETECTED Final   Pseudomonas aeruginosa NOT DETECTED NOT DETECTED Final   Candida albicans NOT DETECTED NOT DETECTED Final   Candida glabrata NOT DETECTED NOT DETECTED Final   Candida krusei NOT DETECTED NOT DETECTED Final   Candida parapsilosis NOT DETECTED NOT DETECTED Final   Candida tropicalis NOT DETECTED NOT DETECTED Final  Culture, blood (routine x 2)     Status: None (Preliminary result)   Collection Time: 08/01/2017  6:22 PM  Result Value Ref Range Status   Specimen Description BLOOD RIGHT WRIST  Final   Special Requests   Final    BOTTLES DRAWN AEROBIC ONLY Blood Culture adequate  volume   Culture NO GROWTH 4 DAYS  Final   Report Status PENDING  Incomplete  Urine culture     Status: Abnormal   Collection Time: 08/13/2017  8:04 PM  Result Value Ref Range Status   Specimen Description URINE, RANDOM  Final   Special Requests NONE  Final   Culture MULTIPLE SPECIES PRESENT, SUGGEST RECOLLECTION (A)  Final   Report Status 08/02/2017 FINAL  Final  MRSA PCR Screening     Status: Abnormal   Collection Time: 08/01/17 12:41 AM  Result Value Ref Range Status   MRSA by PCR POSITIVE (A) NEGATIVE Final    Comment:        The GeneXpert MRSA Assay (FDA approved for NASAL specimens only), is one component of a comprehensive MRSA colonization surveillance program. It is not intended to diagnose MRSA infection nor to guide or monitor treatment for MRSA infections. CRITICAL RESULT CALLED TO, READ BACK BY AND VERIFIED WITH: A. LEWIS, RN AT [phone] ON 08/01/17 BY C, JESSUP, MLT.   Culture, blood (Routine X 2) w Reflex to ID Panel     Status: None (Preliminary result)   Collection Time: 08/01/17  1:34 PM  Result Value Ref Range Status   Specimen Description BLOOD LEFT HAND  Final   Special Requests IN PEDIATRIC BOTTLE Blood Culture adequate volume  Final   Culture NO GROWTH 3 DAYS  Final   Report Status PENDING  Incomplete  Culture, blood (Routine X 2) w Reflex to ID Panel     Status: Abnormal   Collection Time: 08/01/17  1:34 PM  Result Value Ref Range Status   Specimen Description BLOOD LEFT HAND  Final   Special Requests IN PEDIATRIC BOTTLE Blood Culture adequate volume  Final   Culture  Setup Time   Final    GRAM POSITIVE COCCI IN PEDIATRIC BOTTLE CRITICAL RESULT CALLED TO, READ BACK BY AND VERIFIED WITH: A MAYER PHARMD AT [place] ON [id number] BY SJW    Culture (A)  Final    STAPHYLOCOCCUS SPECIES (COAGULASE NEGATIVE) THE SIGNIFICANCE OF ISOLATING THIS ORGANISM FROM A SINGLE SET OF BLOOD CULTURES WHEN MULTIPLE SETS ARE DRAWN IS UNCERTAIN. PLEASE NOTIFY THE MICROBIOLOGY DEPARTMENT  WITHIN ONE WEEK IF SPECIATION AND SENSITIVITIES ARE REQUIRED.  Report Status 08/03/2017 FINAL  Final  Culture, Urine     Status: None   Collection Time: 08/02/17 12:21 PM  Result Value Ref Range Status   Specimen Description URINE, CATHETERIZED  Final   Special Requests Normal  Final   Culture NO GROWTH  Final   Report Status 08/03/2017 FINAL  Final    Studies/Results: Dg Abd Portable 1v  Result Date: 08/03/2017 CLINICAL DATA:  Encounter for feeding tube placement EXAM: PORTABLE ABDOMEN - 1 VIEW COMPARISON:  05/19/2017 FINDINGS: Tip feeding tube projects over the distal third portion of the duodenum. Nonobstructive bowel gas pattern. Large soft tissue calcification projects over the RIGHT mid abdomen. Bones demineralized. Lung bases clear. IMPRESSION: Tip of feeding tube projects over the distal third portion of the duodenum. Electronically Signed   By: Lavonia Dana M.D.   On: 08/03/2017 17:08      Assessment/Plan:  INTERVAL HISTORY: patient has had GIB that now has stopped, her renal function continues to deteriorate   Active Problems:   Sepsis ([place])   MRSA bacteremia   Staphylococcus aureus bacteremia with sepsis ([place])   Fecal impaction ([facility])   Cryptococcal meningitis ([place])   Septic shock ([place])   Vascular dementia without behavioral disturbance   Palliative care encounter   DNR (do not resuscitate) discussion   Acute lower GI hemorrhage   Hypovolemic shock (H[facility])    Katherine Cooper is a 76 y.o. female with  Dementia, bed bound since February with multiple deep decubitus ulcers admitted with MRSA bacteremia and septic shock, MV endocarditis.   She continues to do very poorly and now is with worsening renal failure.   I explained her abysmal prognosis to niece who was present  I DO NOT think CVVHD or HD will be offered or appropriate for the patient  I DO NOT think she can or will survive this hospitalization       LOS: 4 days   Alcide Evener 08/04/2017,  3:49 PM

## 2017-08-04 NOTE — Progress Notes (Signed)
PULMONARY / CRITICAL CARE MEDICINE   Name: Katherine Cooper MRN: [id number] DOB: 29-Sep-1941    ADMISSION DATE:  08/21/2017 CONSULTATION DATE:  07/26/2017  REFERRING MD:  Dr. Ralene Bathe   CHIEF COMPLAINT:  Sepsis   HISTORY OF PRESENT ILLNESS:  76 y.o. female with PMH of Alzheimer dementia, Anemia, Breast CA, CKD, GERD, HTN, Hypothyroid, CVA. Presents to ED on 8/7 with progressive lethargy. Family reports that patient has been at [facility] since February 2018 and since has been bed bound however, alert and verbal and confused. Patient has stage 3 sacral pressure ulcer (which looks clean). For the last few days reportedly has been non-verbal and moaning. Upon arrival patient was hypoglycemic (glucose 49) and hypotensive (84/69). WBC 18.7, LA 2.10, administered 3L NS and started on Levophed gtt. CT head negative for acute process. PCCM asked to admit.   SUBJECTIVE:   Worsening shock overnight requiring increased vasopressors and bolus IV fluid. Recurrent GI bleeding with bolus IV fluid administered. Patient was restarted on stress dosed steroids.  REVIEW OF SYSTEMS:  Unable to obtain with encephalopathy & baseline dementia.   VITAL SIGNS: BP 91/79   Pulse (!) 142   Temp 97.6 F (36.4 C) (Oral)   Resp 16   Ht 5\' 3"  (1.6 m)   Wt 292 lb 5.3 oz (132.6 kg)   SpO2 100%   BMI 51.78 kg/m   HEMODYNAMICS: CVP:  [3 mmHg-4 mmHg] 3 mmHg  VENTILATOR SETTINGS:    INTAKE / OUTPUT: I/O last 3 completed shifts: In: 6252.3 [I.V.:3604.3; Blood:632; NG/GT:266; IV Piggyback:1750] Out: 482 [Urine:482]  PHYSICAL EXAMINATION: General:  Still no family at bedside. Eyes closed. No acute distress.  Integument:  Bandage over her right ear. Warm. Dry. HEENT:  Moist and his membranes. No scleral icterus. Cardiovascular:  Tachycardic. No JVD appreciated. Lower extremity edema noted. Pulmonary:  Slightly diminished breath sounds in the bases. Normal work of breathing as a cannula. Abdomen: Soft. Protuberant. Normal bowel  sounds.  Neurological:  Less awake today. Moans responses. Not following commands.  LABS:  BMET  Recent Labs Lab 08/02/17 0808 08/03/17 0210 08/04/17 0750  NA 134* 134* 134*  K 3.6 3.2* 3.7  CL 106 103 106  CO2 15* 20* 12*  BUN 58* 56* 54*  CREATININE 3.74* 3.62* 3.32*  GLUCOSE 136* 114* 207*    Electrolytes  Recent Labs Lab 08/02/17 0808 08/03/17 0210 08/04/17 0750  CALCIUM 8.4* 8.2* 8.4*  MG 1.7 1.7 1.5*  PHOS 3.7 3.6 4.8*    CBC  Recent Labs Lab 08/03/17 1717 08/03/17 2200 08/04/17 0750  WBC 22.2* 20.0* 20.2*  HGB 8.8* 8.3* 11.6*  HCT 27.2* 25.5* 34.6*  PLT 194 193 135*    Coag's  Recent Labs Lab 08/02/17 0808 08/03/17 0210 08/03/17 2200  APTT  --  40*  --   INR 1.49 1.49 1.47    Sepsis Markers  Recent Labs Lab 08/01/17 2314 08/02/17 0808 08/03/17 2229 08/04/17 0129 08/04/17 0750  LATICACIDVEN 1.4  --  3.1* 8.9*  --   PROCALCITON  --  12.08 5.10  --  3.78    ABG  Recent Labs Lab 08/01/17 0808 08/01/17 1640 08/02/17 0343  PHART 7.301* 7.405 7.392  PCO2ART 22.2* 21.8* 26.2*  PO2ART 101 87.0 93.6    Liver Enzymes  Recent Labs Lab 08/02/17 0808 08/03/17 0210 08/04/17 0750  AST 37 39 115*  ALT 83* 89* 102*  ALKPHOS 179* 166* 160*  BILITOT 1.0 0.8 1.0  ALBUMIN 1.5* 1.4* 1.4*  Cardiac Enzymes No results for input(s): TROPONINI, PROBNP in the last 168 hours.  Glucose  Recent Labs Lab 08/03/17 0737 08/03/17 1131 08/03/17 1536 08/03/17 1927 08/03/17 2346 08/04/17 0823  GLUCAP 100* 95 107* 113* 132* 185*    Imaging Dg Abd Portable 1v  Result Date: 08/03/2017 CLINICAL DATA:  Encounter for feeding tube placement EXAM: PORTABLE ABDOMEN - 1 VIEW COMPARISON:  05/19/2017 FINDINGS: Tip feeding tube projects over the distal third portion of the duodenum. Nonobstructive bowel gas pattern. Large soft tissue calcification projects over the RIGHT mid abdomen. Bones demineralized. Lung bases clear. IMPRESSION: Tip of  feeding tube projects over the distal third portion of the duodenum. Electronically Signed   By: Lavonia Dana M.D.   On: 08/03/2017 17:08     STUDIES:  CT HEAD W/O 8/7:  No acute findings.  Stable mild atrophy and small vessel disease. CT RENAL STONE STUDY 8/7:  Large amount of stool within the rectum as can be seen with impaction. Moderate hiatal hernia. Hepatic steatosis. High attenuation within the gallbladder lumen most suggestive of stones/sludge. RENAL U/S 8/7:  Both kidneys demonstrate mild cortical thinning. No hydronephrosis identified. PORT CXR 8/8:  Previously reviewed by me. Left internal jugular central venous catheter in good position. No parenchymal opacity or mass appreciated. No pleural effusion. TTE 8/9:  LV normal in size w/ EF 65-70% & no regional wall motion abnormalities. LA & RA normal in size. RV normal in size & function. AV w/ moderate regurgitation & mild stenosis. Aortic root normal in size. MV w/ trivial regurg without stenosis. Mobile echodensity on mitral valve leaflet. PV with trivial regurg & no stenosis. Mild tricuspid valve regurg. No pericardial effusion.   MICROBIOLOGY: MRSA PCR 8/8:  Positive Blood Cultures x2 8/7 >>> 1/2 Bottles Positive MRSA Urine Ctx 8/7 (from SNF Foley) >>> Multiple Species Present Blood Cultures x2 8/8 >>> 1/2 bottles Coag Neg Staph Urine Culture 8/9:  Negative   ANTIBIOTICS: Zosyn 8/7 (x1 dose) Meropenem 8/7 (x1 dose) Vancomycin 8/7 >>>  SIGNIFICANT EVENTS: 8/07 - Admit    LINES/TUBES: L IJ CVL 8/7 >>> R RAD ART LINE 8/8 >>> FOLEY (from Facility) Replaced 8/8 >>>  ASSESSMENT / PLAN:  PULMONARY A: Acute hypoxic respiratory failure: Likely secondary to atelectasis. Recurred.  P:   Close monitoring given risk of intubation Continuous pulse oximetry monitoring Weaning FiO2  CARDIOVASCULAR A:  Mitral Valve Vegetation:  Suggested on TTE 8/9. Sinus Tachycardia:  Confirmed with EKG 8/9.  Shock: Secondary to sepsis and  blood loss. Stable.  History of essential hypertension  P:  Weaning vasopressors for MAP >65 & SBP >90 Continue telemetry monitoring Vital signs per unit protocol  RENAL A:   Acute on chronic renal failure stage IV:  Improving but oliguric. Metabolic/lactic acidosis:  Worsening now w/ hypotension.  Hyponatremia:  Stable & mild.  Hyperchloremia:  Resolved.  Hypokalemia:  Replaced.  P:   Monitoring electrolytes and renal function daily Trending urine output Trending lactic acid Maintaining MAP >65  GASTROINTESTINAL A:   Transaminitis:  Fluctuating but overall stable.  GIB:  Ongoing, intermittent bleeding. Impacted Stool:  S/P disimpaction.  Failure to Thrive GERD  P:   NPO Continuing Protonix IV q12hr Appreciate GI Recommendations Holding Tube Feedings Protonix IV q12hr  HEMATOLOGIC A:   Anemia:  Worsening with GI Bleeding. S/P 2u PRBC. Coagulopathy:  S/P FFP & Vitamin K 5mg  IV. Worsening. Leukocytosis:  Secondary to sepsis. Stable.  P:  SCDs Off Heparin with bleeding Trending Hgb/Hct q12hr  Transfusing 1u Cryoprecipitate   INFECTIOUS A:   MRSA Bacteremia & Endocarditis:  Repeat cultures 8/8 positive. Mitral Vegetation suggested on TTE 8/9. Sepsis:  H/O ESBL Klebsiella UTI (04/2017) and MRSA bacteremia (04/2017). Possible Osteomyelitis:  Stage 3 sacral decub.  Possible UTI:  Cultures performed from prior SNF Foley before changing. Repeat culture pending.   P:   ID following & appreciate recommendations Empiric antibiotics as above Repeat Cultures pending Trending Procalcitonin per algorithm   ENDOCRINE A:   Hypoglycemia:  Improved.    P:   Accu-Checks q4hr w/ MD notification parameters.  NEUROLOGIC A:   Acute Encephalopathy:  Likely due to toxic metabolic. H/O Alzheimer's Dementia   P:   Avoiding sedating medications.    FAMILY  - Updates:  No family at bedside this morning.   - Inter-disciplinary family meet or Palliative Care meeting due  by: 08/07/2017  DISCUSSION:  76 y.o. female with ongoing shock secondary to sepsis from bacteremia with endocarditis as well as GI blood loss. Appreciated input from pattern medicine, ID, and GI. Prognosis is dismal.  I have spent a total of 34 minutes of critical care time today caring for the patient and reviewing the patient's electronic medical record.   Sonia Baller Ashok Cordia, M.D. [facility] Pulmonary & Critical Care Pager:  [phone] After 3pm or if no response, call [phone] 10:34 AM 08/04/17

## 2017-08-05 DIAGNOSIS — R627 Adult failure to thrive: Secondary | ICD-10-CM

## 2017-08-05 DIAGNOSIS — Z515 Encounter for palliative care: Secondary | ICD-10-CM

## 2017-08-05 DIAGNOSIS — Z66 Do not resuscitate: Secondary | ICD-10-CM

## 2017-08-05 LAB — BPAM CRYOPRECIPITATE
BLOOD PRODUCT EXPIRATION DATE: 201808111800
ISSUE DATE / TIME: 201808111238
Unit Type and Rh: 5100

## 2017-08-05 LAB — TYPE AND SCREEN
ABO/RH(D): B POS
ANTIBODY SCREEN: POSITIVE
DAT, IGG: NEGATIVE
DONOR AG TYPE: NEGATIVE
DONOR AG TYPE: NEGATIVE
DONOR AG TYPE: NEGATIVE
Donor AG Type: NEGATIVE
Donor AG Type: NEGATIVE
UNIT DIVISION: 0
Unit division: 0
Unit division: 0
Unit division: 0
Unit division: 0

## 2017-08-05 LAB — COMPREHENSIVE METABOLIC PANEL
ALT: 102 U/L — ABNORMAL HIGH (ref 14–54)
ANION GAP: 12 (ref 5–15)
AST: 70 U/L — ABNORMAL HIGH (ref 15–41)
Albumin: 1.4 g/dL — ABNORMAL LOW (ref 3.5–5.0)
Alkaline Phosphatase: 159 U/L — ABNORMAL HIGH (ref 38–126)
BUN: 52 mg/dL — ABNORMAL HIGH (ref 6–20)
CHLORIDE: 107 mmol/L (ref 101–111)
CO2: 16 mmol/L — AB (ref 22–32)
Calcium: 8.9 mg/dL (ref 8.9–10.3)
Creatinine, Ser: 3.28 mg/dL — ABNORMAL HIGH (ref 0.44–1.00)
GFR calc non Af Amer: 13 mL/min — ABNORMAL LOW (ref >60)
GFR, EST AFRICAN AMERICAN: 15 mL/min — AB (ref >60)
Glucose, Bld: 175 mg/dL — ABNORMAL HIGH (ref 65–99)
Potassium: 3.6 mmol/L (ref 3.5–5.1)
SODIUM: 135 mmol/L (ref 135–145)
Total Bilirubin: 1.1 mg/dL (ref 0.3–1.2)
Total Protein: 4.4 g/dL — ABNORMAL LOW (ref 6.5–8.1)

## 2017-08-05 LAB — PROCALCITONIN: PROCALCITONIN: 4.14 ng/mL

## 2017-08-05 LAB — CBC WITH DIFFERENTIAL/PLATELET
BASOS PCT: 0 %
Basophils Absolute: 0 10*3/uL (ref 0.0–0.1)
EOS ABS: 0 10*3/uL (ref 0.0–0.7)
EOS PCT: 0 %
HCT: 32.8 % — ABNORMAL LOW (ref 36.0–46.0)
Hemoglobin: 11.1 g/dL — ABNORMAL LOW (ref 12.0–15.0)
Lymphocytes Relative: 9 %
Lymphs Abs: 1.8 10*3/uL (ref 0.7–4.0)
MCH: 28.9 pg (ref 26.0–34.0)
MCHC: 33.8 g/dL (ref 30.0–36.0)
MCV: 85.4 fL (ref 78.0–100.0)
MONO ABS: 0.5 10*3/uL (ref 0.1–1.0)
MONOS PCT: 2 %
Neutro Abs: 17.1 10*3/uL — ABNORMAL HIGH (ref 1.7–7.7)
Neutrophils Relative %: 89 %
Platelets: 151 10*3/uL (ref 150–400)
RBC: 3.84 MIL/uL — ABNORMAL LOW (ref 3.87–5.11)
RDW: 19 % — AB (ref 11.5–15.5)
WBC: 19.3 10*3/uL — ABNORMAL HIGH (ref 4.0–10.5)

## 2017-08-05 LAB — BPAM RBC
BLOOD PRODUCT EXPIRATION DATE: 201808292359
BLOOD PRODUCT EXPIRATION DATE: 201808292359
BLOOD PRODUCT EXPIRATION DATE: 201808302359
Blood Product Expiration Date: 201808292359
Blood Product Expiration Date: 201808312359
ISSUE DATE / TIME: 201808080515
ISSUE DATE / TIME: 201808102342
ISSUE DATE / TIME: 201808110334
UNIT TYPE AND RH: 7300
Unit Type and Rh: 7300
Unit Type and Rh: 7300
Unit Type and Rh: 7300
Unit Type and Rh: 7300

## 2017-08-05 LAB — LACTIC ACID, PLASMA: LACTIC ACID, VENOUS: 3.4 mmol/L — AB (ref 0.5–1.9)

## 2017-08-05 LAB — PREPARE CRYOPRECIPITATE: UNIT DIVISION: 0

## 2017-08-05 LAB — CULTURE, BLOOD (ROUTINE X 2)
Culture: NO GROWTH
Special Requests: ADEQUATE

## 2017-08-05 LAB — GLUCOSE, CAPILLARY
Glucose-Capillary: 142 mg/dL — ABNORMAL HIGH (ref 65–99)
Glucose-Capillary: 143 mg/dL — ABNORMAL HIGH (ref 65–99)
Glucose-Capillary: 172 mg/dL — ABNORMAL HIGH (ref 65–99)

## 2017-08-05 LAB — MAGNESIUM: MAGNESIUM: 1.6 mg/dL — AB (ref 1.7–2.4)

## 2017-08-05 LAB — PHOSPHORUS: PHOSPHORUS: 4.9 mg/dL — AB (ref 2.5–4.6)

## 2017-08-05 MED ORDER — JEVITY 1.2 CAL PO LIQD
1000.0000 mL | ORAL | Status: DC
  Filled 2017-08-05 (×2): qty 1000

## 2017-08-05 MED ORDER — MORPHINE SULFATE (PF) 2 MG/ML IV SOLN
2.0000 mg | INTRAVENOUS | Status: DC | PRN
  Administered 2017-08-05 – 2017-08-06 (×4): 2 mg via INTRAVENOUS
  Filled 2017-08-05 (×4): qty 1

## 2017-08-05 MED ORDER — NOREPINEPHRINE BITARTRATE 1 MG/ML IV SOLN
0.0000 ug/min | INTRAVENOUS | Status: DC
  Filled 2017-08-05: qty 16

## 2017-08-05 MED ORDER — LORAZEPAM 2 MG/ML IJ SOLN
1.0000 mg | INTRAMUSCULAR | Status: DC | PRN
  Administered 2017-08-05: 1 mg via INTRAVENOUS
  Filled 2017-08-05: qty 1

## 2017-08-05 MED ORDER — MAGNESIUM SULFATE 2 GM/50ML IV SOLN
2.0000 g | Freq: Once | INTRAVENOUS | Status: AC
  Administered 2017-08-05: 2 g via INTRAVENOUS
  Filled 2017-08-05: qty 50

## 2017-08-05 NOTE — Progress Notes (Signed)
   Patient Name: Katherine Cooper Date of Encounter: 08/05/2017, 9:55 AM    Subjective  No further bleeding per RN   Objective  BP 96/82   Pulse (!) 122   Temp 98.6 F (37 C) (Oral)   Resp 16   Ht 5\' 3"  (1.6 m)   Wt 298 lb 8.1 oz (135.4 kg)   SpO2 99%   BMI 52.88 kg/m  Obtunded  CBC Latest Ref Rng & Units 08/05/2017 08/04/2017 08/04/2017  WBC 4.0 - 10.5 K/uL 19.3(H) - 20.2(H)  Hemoglobin 12.0 - 15.0 g/dL 11.1(L) 10.8(L) 11.6(L)  Hematocrit 36.0 - 46.0 % 32.8(L) 31.8(L) 34.6(L)  Platelets 150 - 400 K/uL 151 - 135(L)     Assessment and Plan  Lower GI bleed - currently stopped MSOF w/ terrible prognosis from complications of sepsis/endocarditis  Supportive care Ok to restart tube feeds if desired  Please call us back if ? But do not think endoscopic evaluation in her best interest Palliative care team defining goals of care - I would support comfort care  Gatha Mayer, MD, [facility] Gastroenterology [phone] (pager) 08/05/2017 9:55 AM

## 2017-08-05 NOTE — Progress Notes (Signed)
Patient ID: Emberleigh Reily, female   DOB: [birth year], 76 y.o.   MRN: [id number]  This NP visited patient at the bedside as a follow up for palliative needs and emotional support.  Meet with daughter [name] and decision to shift to full comfort path has been decided.  Once large family gathers and family have had a chance to see patient and say their goodbyes life-prolonging measures will be weaned; and comfort will be the focus of care.  1145:  All family gathered, prayer with chaplain.  Order to wean pressors and comfort medications for dyspnea, agitation and pain in place.    Discussed natural trajectory and expectations at EOL, per Dr Ashok Cordia will remain on this unit and if patient stabilizes consider shift to regular unit.  Prognosis is likely hours to days.  Questions and concerns addressed  Emotional support offered.  Discussed with Dr Ashok Cordia  Time in 1100          Time out    1145  Total time spent ont he unit was 45 min  Greater than 50% of the time was spent in counseling and coordination of care  Wadie Lessen NP  Palliative Medicine Team Team Phone # [phone] Pager [phone]

## 2017-08-05 NOTE — Progress Notes (Signed)
PULMONARY / CRITICAL CARE MEDICINE   Name: Katherine Cooper MRN: [id number] DOB: [birth year]    ADMISSION DATE:  08/17/2017 CONSULTATION DATE:  07/26/2017  REFERRING MD:  Dr. Ralene Bathe   CHIEF COMPLAINT:  Sepsis   HISTORY OF PRESENT ILLNESS:  76 y.o. female with PMH of Alzheimer dementia, Anemia, Breast CA, CKD, GERD, HTN, Hypothyroid, CVA. Presents to ED on 8/7 with progressive lethargy. Family reports that patient has been at [facility] since February 2018 and since has been bed bound however, alert and verbal and confused. Patient has stage 3 sacral pressure ulcer (which looks clean). For the last few days reportedly has been non-verbal and moaning. Upon arrival patient was hypoglycemic (glucose 49) and hypotensive (84/69). WBC 18.7, LA 2.10, administered 3L NS and started on Levophed gtt. CT head negative for acute process. PCCM asked to admit.   SUBJECTIVE:   No acute events overnight. Patient remains on high-dose vasopressors. Mental status progressively worsening.  REVIEW OF SYSTEMS:  Unable to obtain with encephalopathy & baseline dementia.   VITAL SIGNS: BP 96/82   Pulse (!) 122   Temp 98.2 F (36.8 C) (Oral)   Resp 16   Ht 5\' 3"  (1.6 m)   Wt 298 lb 8.1 oz (135.4 kg)   SpO2 99%   BMI 52.88 kg/m   HEMODYNAMICS:    VENTILATOR SETTINGS:    INTAKE / OUTPUT: I/O last 3 completed shifts: In: 6918.4 [I.V.:3936.4; Blood:712; NG/GT:220; IV [id number]:1779] Out: 186 [Urine:184; Stool:2]  PHYSICAL EXAMINATION: General:  Multiple family members at bedside. Eyes closed. No distress. Integument:  Warm. Dry. No rash on exposed skin. Bandage of her right ear. HEENT:  No scleral icterus. No scleral injection. Pupils symmetric. Cardiovascular:  Remains tachycardic. Lower extremity edema persists. Unable to appreciate JVD. Pulmonary:  Normal work of breathing on nasal cannula oxygen. Slightly diminished breath sounds in the bases continues. Abdomen: Normal bowel sounds. Soft. Protuberant..   Neurological:  Continues to have worsening mental status with somnolence. Minimal response to tactile stimulus.  LABS:  BMET  Recent Labs Lab 08/03/17 0210 08/04/17 0750 08/05/17 0120  NA 134* 134* 135  K 3.2* 3.7 3.6  CL 103 106 107  CO2 20* 12* 16*  BUN 56* 54* 52*  CREATININE 3.62* 3.32* 3.28*  GLUCOSE 114* 207* 175*    Electrolytes  Recent Labs Lab 08/03/17 0210 08/04/17 0750 08/05/17 0120  CALCIUM 8.2* 8.4* 8.9  MG 1.7 1.5* 1.6*  PHOS 3.6 4.8* 4.9*    CBC  Recent Labs Lab 08/03/17 2200 08/04/17 0750 08/04/17 1539 08/05/17 0120  WBC 20.0* 20.2*  --  19.3*  HGB 8.3* 11.6* 10.8* 11.1*  HCT 25.5* 34.6* 31.8* 32.8*  PLT 193 135*  --  151    Coag's  Recent Labs Lab 08/03/17 0210 08/03/17 2200 08/04/17 1539  APTT 40*  --  40*  INR 1.49 1.47 1.40    Sepsis Markers  Recent Labs Lab 08/03/17 2229  08/04/17 0750 08/04/17 1226 08/04/17 1540 08/05/17 0047 08/05/17 0120  LATICACIDVEN 3.1*  < >  --  5.9* 3.8* 3.4*  --   PROCALCITON 5.10  --  3.78  --   --   --  4.14  < > = values in this interval not displayed.  ABG  Recent Labs Lab 08/01/17 0808 08/01/17 1640 08/02/17 0343  PHART 7.301* 7.405 7.392  PCO2ART 22.2* 21.8* 26.2*  PO2ART 101 87.0 93.6    Liver Enzymes  Recent Labs Lab 08/03/17 0210 08/04/17 0750 08/05/17 0120  AST 39 115* 70*  ALT 89* 102* 102*  ALKPHOS 166* 160* 159*  BILITOT 0.8 1.0 1.1  ALBUMIN 1.4* 1.4* 1.4*    Cardiac Enzymes No results for input(s): TROPONINI, PROBNP in the last 168 hours.  Glucose  Recent Labs Lab 08/04/17 1204 08/04/17 1553 08/04/17 2013 08/05/17 0032 08/05/17 0408 08/05/17 0835  GLUCAP 149* 144* 144* 172* 143* 142*    Imaging No results found.   STUDIES:  CT HEAD W/O 8/7:  No acute findings.  Stable mild atrophy and small vessel disease. CT RENAL STONE STUDY 8/7:  Large amount of stool within the rectum as can be seen with impaction. Moderate hiatal hernia. Hepatic  steatosis. High attenuation within the gallbladder lumen most suggestive of stones/sludge. RENAL U/S 8/7:  Both kidneys demonstrate mild cortical thinning. No hydronephrosis identified. PORT CXR 8/8:  Previously reviewed by me. Left internal jugular central venous catheter in good position. No parenchymal opacity or mass appreciated. No pleural effusion. TTE 8/9:  LV normal in size w/ EF 65-70% & no regional wall motion abnormalities. LA & RA normal in size. RV normal in size & function. AV w/ moderate regurgitation & mild stenosis. Aortic root normal in size. MV w/ trivial regurg without stenosis. Mobile echodensity on mitral valve leaflet. PV with trivial regurg & no stenosis. Mild tricuspid valve regurg. No pericardial effusion.   MICROBIOLOGY: MRSA PCR 8/8:  Positive Blood Cultures x2 8/7 >>> 1/2 Bottles Positive MRSA Urine Ctx 8/7 (from SNF Foley) >>> Multiple Species Present Blood Cultures x2 8/8 >>> 1/2 bottles Coag Neg Staph Urine Culture 8/9:  Negative   ANTIBIOTICS: Zosyn 8/7 (x1 dose) Meropenem 8/7 (x1 dose) Vancomycin 8/7 >>>  SIGNIFICANT EVENTS: 8/07 - Admit  8/11 - Worsening shock overnight w/ recurrence of GI bleeding & increased vasopressor requirement. Transfused PRBC & Cryoprecipitate  LINES/TUBES: L IJ CVL 8/7 >>> R RAD ART LINE 8/8 >>> FOLEY (from Facility) Replaced 8/8 >>>  ASSESSMENT / PLAN:  PULMONARY A: Acute hypoxic respiratory failure: Likely secondary to atelectasis. Recurred.  P:   Continuing nasal cannula oxygen Continuous pulse oximetry monitoring  CARDIOVASCULAR A:  Mitral Valve Vegetation:  Suggested on TTE 8/9. Sinus Tachycardia:  Confirmed with EKG 8/9.  Shock: Secondary to sepsis and blood loss. Stable.  History of essential hypertension  P:  Plan to discontinue pressors when family ready Monitoring vitals per unit protocol  RENAL A:   Acute on chronic renal failure stage IV:  Serum renal function improving by patient remains  minimally and uric. Metabolic/lactic acidosis:  Improving. Hyponatremia:  Resolved. Hyperchloremia:  Resolved.  Hypokalemia:  Resolved. Hypomagnesemia: Replaced.  P:   Continuing Foley Catheter for patient comfort Holding on further labs  GASTROINTESTINAL A:   Transaminitis:  No significant change. GIB:  Intermittent bleeding.  Impacted Stool:  S/P disimpaction.  Failure to Thrive GERD  P:   NPO Continuing Protonix IV q12hr Appreciate GI Recommendations Continuing to hold tube feedings  HEMATOLOGIC A:   Anemia:  Worsening with GI Bleeding. S/P 2u PRBC. Coagulopathy:  S/P FFP & Vitamin K 5mg  IV. Worsening. Leukocytosis:  Secondary to sepsis. Stable.  P:  Holding on further labs  INFECTIOUS A:   MRSA Bacteremia & Endocarditis:  Repeat cultures 8/8 positive. Mitral Vegetation suggested on TTE 8/9. Sepsis:  H/O ESBL Klebsiella UTI (04/2017) and MRSA bacteremia (04/2017). Possible Osteomyelitis:  Stage 3 sacral decub.  Possible UTI:  Cultures performed from prior SNF Foley before changing. Repeat culture pending.   P:  ID following & appreciate recommendations Repeat cultures pending Plan to discontinue antibiotics as we transition to full comfort care  ENDOCRINE A:   Hypoglycemia:  Improved.    P:   Plan to discontinue accuchecks as we transition to full comfort care  NEUROLOGIC A:   Acute Encephalopathy:  Likely due to toxic metabolic. H/O Alzheimer's Dementia   P:   Avoiding sedating medications.    FAMILY  - Updates:  Daughter and granddaughters updated at bedside along with Palliative Medicine Service.   - Inter-disciplinary family meet or Palliative Care meeting due by: 08/07/2017  DISCUSSION:  76 y.o. female with ongoing shock. GI bleeding seems to have stopped for now. Mental status progressively worsening. Patient remains anuric. Family at bedside report that the decision has been made to transition to palliation and full comfort care. As such I'm  changing the patient to DO NOT RESUSCITATE. We will contact us once all family has decided on a time to discontinue vasopressor support. Patient will remain in the intensive care unit as she transitions. Consult to hospital chaplain will be placed at family's request. Appreciate assistance from palliative medicine service.  I have spent a total of 31 minutes of critical care time today caring for the patient, updating family as well as discussing plan of care, and reviewing the patient's electronic medical record.   Sonia Baller Ashok Cordia, M.D. [facility] Pulmonary & Critical Care Pager:  [phone] After 3pm or if no response, call [phone] 9:11 AM 08/05/17

## 2017-08-05 NOTE — Progress Notes (Signed)
[facility] Progress Note Patient Name: Katherine Cooper DOB: Jun 26, 1941 MRN: [id number]   Date of Service  08/05/2017  HPI/Events of Note  2 gm Mg given  eICU Interventions       Intervention Category Major Interventions: Electrolyte abnormality - evaluation and management  YACOUB,WESAM 08/05/2017, 4:51 AM

## 2017-08-05 NOTE — Progress Notes (Signed)
CRITICAL VALUE ALERT  Critical Value:  Lactic acid 3.4  Date & Time Notied:  08/05/2017 @ 02:25  Provider Notified: Fatima Blank.  Orders Received/Actions taken: NA

## 2017-08-05 NOTE — Progress Notes (Signed)
SLP Cancellation Note  Patient Details Name: Katherine Cooper MRN: [id number] DOB: [birth year]   Cancelled treatment:       Reason Eval/Treat Not Completed: Patient's level of consciousness.   Deneise Lever, [place], [place] Speech-Language Pathologist [place] 08/05/2017, 11:53 AM

## 2017-08-06 DIAGNOSIS — N179 Acute kidney failure, unspecified: Secondary | ICD-10-CM

## 2017-08-06 DIAGNOSIS — N170 Acute kidney failure with tubular necrosis: Secondary | ICD-10-CM

## 2017-08-06 DIAGNOSIS — R7881 Bacteremia: Secondary | ICD-10-CM

## 2017-08-06 DIAGNOSIS — K922 Gastrointestinal hemorrhage, unspecified: Secondary | ICD-10-CM

## 2017-08-06 DIAGNOSIS — B451 Cerebral cryptococcosis: Secondary | ICD-10-CM

## 2017-08-06 LAB — CULTURE, BLOOD (ROUTINE X 2)
Culture: NO GROWTH
Special Requests: ADEQUATE

## 2017-08-06 MED ORDER — SODIUM CHLORIDE 0.9 % IV SOLN
1.0000 mg/h | INTRAVENOUS | Status: DC
  Administered 2017-08-06: 1 mg/h via INTRAVENOUS
  Filled 2017-08-06: qty 10

## 2017-08-06 MED ORDER — MORPHINE SULFATE (PF) 4 MG/ML IV SOLN: 2.0000 mg | INTRAVENOUS | Status: DC | PRN

## 2017-08-09 ENCOUNTER — Telehealth: Payer: Self-pay

## 2017-08-09 NOTE — Telephone Encounter (Signed)
On 08/09/2017 I received a death certificate from [facility] (original). The death certificate is for burial. The patient is a patient of Doctor Titus Mould. The death certificate will be taken to [place] ([address]) this pm for signature.  On Sep 07, 2017 I received the death certificate back from Doctor Titus Mould.  I got the death certificate ready and called the funeral home to let them know the death certificate is ready for pickup. I also faxed a copy to the funeral home per the funeral home request.

## 2017-08-25 NOTE — Progress Notes (Signed)
Wasted 248 ml.s MSO4 IV with nurse [name] Winter.

## 2017-08-25 NOTE — Progress Notes (Signed)
Wasted Morphine IV medication 235ml with Derald Macleod, RN as second RN

## 2017-08-25 NOTE — Progress Notes (Addendum)
PULMONARY / CRITICAL CARE MEDICINE   Name: Katherine Cooper MRN: [id number] DOB: 1941-05-12    ADMISSION DATE:  07/25/2017 CONSULTATION DATE:  07/27/2017  REFERRING MD:  Dr. Ralene Bathe   CHIEF COMPLAINT:  Sepsis   HISTORY OF PRESENT ILLNESS:  76 y.o. female with PMH of Alzheimer dementia, Anemia, Breast CA, CKD, GERD, HTN, Hypothyroid, CVA. Presents to ED on 8/7 with progressive lethargy. Family reports that patient has been at [facility] since February 2018 and since has been bed bound however, alert and verbal and confused. Patient has stage 3 sacral pressure ulcer (which looks clean). For the last few days reportedly has been non-verbal and moaning. Upon arrival patient was hypoglycemic (glucose 49) and hypotensive (84/69). WBC 18.7, LA 2.10, administered 3L NS and started on Levophed gtt. CT head negative for acute process. PCCM asked to admit.   SUBJECTIVE:   Family has decided on full comfort care.  REVIEW OF SYSTEMS:  Unable to obtain with encephalopathy & baseline dementia.   VITAL SIGNS: BP (!) 48/36   Pulse (!) 107   Temp 98.6 F (37 C) (Oral)   Resp (!) 26   Ht 5\' 3"  (1.6 m)   Wt 135.4 kg (298 lb 8.1 oz)   SpO2 98%   BMI 52.88 kg/m   HEMODYNAMICS:    VENTILATOR SETTINGS:    INTAKE / OUTPUT: I/O last 3 completed shifts: In: 1167.6 [I.V.:1017.6; NG/GT:100; IV Piggyback:50] Out: 95 [Urine:95]  PHYSICAL EXAMINATION: General:  Critically ill.  Multiple family members at bedside. Eyes closed. No distress. Integument:  Warm. Dry. No rash on exposed skin. Bandage of her right ear. HEENT:  No scleral icterus. No scleral injection. Pupils symmetric. Cardiovascular:  RRR, 4/6 SEM, 1+ edema. Pulmonary:  Normal work of breathing on nasal cannula oxygen. Slightly diminished breath sounds in the bases continues. Abdomen: Normal bowel sounds. Soft. Protuberant..  Neurological:  Essentially unresponsive.  LABS:  BMET  Recent Labs Lab 08/03/17 0210 08/04/17 0750 08/05/17 0120  NA  134* 134* 135  K 3.2* 3.7 3.6  CL 103 106 107  CO2 20* 12* 16*  BUN 56* 54* 52*  CREATININE 3.62* 3.32* 3.28*  GLUCOSE 114* 207* 175*    Electrolytes  Recent Labs Lab 08/03/17 0210 08/04/17 0750 08/05/17 0120  CALCIUM 8.2* 8.4* 8.9  MG 1.7 1.5* 1.6*  PHOS 3.6 4.8* 4.9*    CBC  Recent Labs Lab 08/03/17 2200 08/04/17 0750 08/04/17 1539 08/05/17 0120  WBC 20.0* 20.2*  --  19.3*  HGB 8.3* 11.6* 10.8* 11.1*  HCT 25.5* 34.6* 31.8* 32.8*  PLT 193 135*  --  151    Coag's  Recent Labs Lab 08/03/17 0210 08/03/17 2200 08/04/17 1539  APTT 40*  --  40*  INR 1.49 1.47 1.40    Sepsis Markers  Recent Labs Lab 08/03/17 2229  08/04/17 0750 08/04/17 1226 08/04/17 1540 08/05/17 0047 08/05/17 0120  LATICACIDVEN 3.1*  < >  --  5.9* 3.8* 3.4*  --   PROCALCITON 5.10  --  3.78  --   --   --  4.14  < > = values in this interval not displayed.  ABG  Recent Labs Lab 08/01/17 0808 08/01/17 1640 08/02/17 0343  PHART 7.301* 7.405 7.392  PCO2ART 22.2* 21.8* 26.2*  PO2ART 101 87.0 93.6    Liver Enzymes  Recent Labs Lab 08/03/17 0210 08/04/17 0750 08/05/17 0120  AST 39 115* 70*  ALT 89* 102* 102*  ALKPHOS 166* 160* 159*  BILITOT 0.8 1.0 1.1  ALBUMIN 1.4* 1.4* 1.4*    Cardiac Enzymes No results for input(s): TROPONINI, PROBNP in the last 168 hours.  Glucose  Recent Labs Lab 08/04/17 1204 08/04/17 1553 08/04/17 2013 08/05/17 0032 08/05/17 0408 08/05/17 0835  GLUCAP 149* 144* 144* 172* 143* 142*    Imaging No results found.   STUDIES:  CT HEAD W/O 8/7:  No acute findings.  Stable mild atrophy and small vessel disease. CT RENAL STONE STUDY 8/7:  Large amount of stool within the rectum as can be seen with impaction. Moderate hiatal hernia. Hepatic steatosis. High attenuation within the gallbladder lumen most suggestive of stones/sludge. RENAL U/S 8/7:  Both kidneys demonstrate mild cortical thinning. No hydronephrosis identified. PORT CXR 8/8:   Previously reviewed by me. Left internal jugular central venous catheter in good position. No parenchymal opacity or mass appreciated. No pleural effusion. TTE 8/9:  LV normal in size w/ EF 65-70% & no regional wall motion abnormalities. LA & RA normal in size. RV normal in size & function. AV w/ moderate regurgitation & mild stenosis. Aortic root normal in size. MV w/ trivial regurg without stenosis. Mobile echodensity on mitral valve leaflet. PV with trivial regurg & no stenosis. Mild tricuspid valve regurg. No pericardial effusion.   MICROBIOLOGY: MRSA PCR 8/8:  Positive Blood Cultures x2 8/7 >>> 1/2 Bottles Positive MRSA Urine Ctx 8/7 (from SNF Foley) >>> Multiple Species Present Blood Cultures x2 8/8 >>> 1/2 bottles Coag Neg Staph Urine Culture 8/9:  Negative   ANTIBIOTICS: Zosyn 8/7 (x1 dose) Meropenem 8/7 (x1 dose) Vancomycin 8/7 >>> 8/12  SIGNIFICANT EVENTS: 8/07 - Admit  8/11 - Worsening shock overnight w/ recurrence of GI bleeding & increased vasopressor requirement. Transfused PRBC & Cryoprecipitate  LINES/TUBES: L IJ CVL 8/7 >  R RAD ART LINE 8/8 > 8/12 FOLEY (from Facility) Replaced 8/8 >  ASSESSMENT / PLAN:  Acute hypoxic respiratory failure: Likely secondary to atelectasis. Mitral Valve Vegetation:  Suggested on TTE 8/9. Shock: Secondary to sepsis and blood loss. Acute on chronic renal failure stage IV:  Serum renal function improving by patient remains minimally and uric. Metabolic/lactic acidosis. Hypomagnesemia: Replaced. Transaminitis:  No significant change. GIB:  Intermittent bleeding.  Impacted Stool:  S/P disimpaction.  Anemia:  Worsening with GI Bleeding. S/P 2u PRBC. Coagulopathy:  S/P FFP & Vitamin K 5mg  IV. MRSA Bacteremia & Endocarditis:  Repeat cultures 8/8 positive. Mitral Vegetation suggested on TTE 8/9. Sepsis:  H/O ESBL Klebsiella UTI (04/2017) and MRSA bacteremia (04/2017). Possible Osteomyelitis:  Stage 3 sacral decub.  Possible UTI:  Cultures  performed from prior SNF Foley before changing. Repeat culture pending.  Acute Encephalopathy:  Likely due to toxic metabolic. H/O Alzheimer's Dementia. Failure to Thrive.   Discussion: 76 y.o. female with shock, MRSA bacteremia and endocarditis, GI bleed, acute encephalopathy, multi organ failure.  Unfortunately did not respond to treatments and given prior wishes under circumstances such as this, family has decided to move towards palliation and full comfort care. 8/13, pt will move out of ICU to med surge with full comfort measures in place.  Will ask TR[facility] to assume care starting in AM 8/14.    Montey Hora, [place] Pager: [phone]  or [phone] 08/22/2017, 11:31 AM   STAFF NOTE: I, Merrie Roof, MD FACP have personally reviewed patient's available data, including medical history, events of note, physical examination and test results as part of my evaluation. I have discussed with  resident/NP and other care providers such as pharmacist, RN and RRT. In addition, I personally evaluated patient and elicited key findings of: not awake, , int eye opening, no distress, reduced bs, abdo soft, edema 4 plus, goals are now comfort care, RR goal is 12-18 and no pain, low threhseld for infusion morphine, no role increase O2 needs at all , no blood draws, I updated family, to floor She now is paradoxical and rr 22-24, d/w family, add drip, dc NGT   Lavon Paganini. Titus Mould, MD, [place] Pgr: [facility] 2017/08/18 12:41 PM

## 2017-08-25 NOTE — Progress Notes (Signed)
Patient ID: Katherine Cooper, female   DOB: 05/21/1941, 76 y.o.   MRN: [id number]  This NP visited patient at the bedside as a follow up for palliative needs and emotional support.  Meet with daughter Robin/daughter at bedside, patient appears comfortable as she transitions at EOL.   Comfort is the focus of care.  Discussed natural trajectory and expectations at EOL, per Dr Ashok Cordia will remain on this unit and if patient stabilizes consider shift to regular unit.  Prognosis is likely hours to days.  Questions and concerns addressed  Emotional support offered.  Discussed with Dr Ashok Cordia  Time in 0800          Time out 0820      Total time spent ont he unit was 20 min  Greater than 50% of the time was spent in counseling and coordination of care  Wadie Lessen NP  Palliative Medicine Team Team Phone # [phone] Pager [phone]

## 2017-08-25 NOTE — Discharge Summary (Signed)
DEATH NOTE: For a complete accounting of the patient's history and physical exam on presentation please refer to the H&P dictated on 08/04/2017. Patient was a 76 year old female with known history of Alzheimer's dementia, past history of breast cancer, chronic renal failure, history of stroke, and essential hypertension who presented from skilled [facility]. Patient had been residing in a skilled nursing facility since February 2018 and has been primarily bedbound. Despite confusion she has been otherwise alert and verbal. Notably the patient was found to have a stage III sacral pressure ulcer on admission to our hospital. For at least a few days prior to presenting the patient had been largely nonverbal and moaning. On presentation the patient was found to be hypoglycemic to 49 and in a state of shock with blood pressure 84/69. She was noted to have a mild lactic acidosis and leukocytosis suggestive of septic shock. She was subsequently bolused with IV fluid and started on vasopressor infusion. The patient was noted to have fecal impaction with imaging to evaluate her acute renal failure.This caused lower GI bleeding which recurred on 8/11with increased vasopressor requirement. Patient did undergo blood product transfusion for correction of coagulopathy as well as treatment of anemia and hypotension. During her hospitalization workup revealed a methicillin-resistant Staphylococcus aureus bacteremia along with echocardiogram findings consistent with endocarditis with a vegetation on her mitral valve.  With the patient's continued with septic shock as well as worsening acute on chronic renal failure and poor mentation palliative medicine was consulted to provide further assistance and directing goals of care discussions. With her multiple medical problems and multisystem organ failure the decision was made to transition to full comfort care. The patient's vasopressor infusions were discontinued and she was  transitioned out of the intensive care unit with continued palliation of symptoms. Patient was pronounced by nursing staff at 3:08 PM on 2017/08/07 with family present. Hospital chaplain was available for spiritual support.  DIAGNOSES AT DEATH: 1. Severe sepsis with septic shock 2. Acute hypoxic respiratory failure 3. Severe metabolic/lactic acidosis 4. Acute liver injury 5. Acute on chronic renal failure stage IV 6. Acute encephalopathy 7. Gastrointestinal bleeding 8. Coagulopathy  9. 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 Failure to thrive 11. History of Alzheimer's dementia 12. History of bedbound status 13. History of breast cancer 14. History of essential hypertension 15. History of hypothyroidism 16. History of stroke

## 2017-08-25 NOTE — Progress Notes (Signed)
CDS Ref number [id number] spoke to Katherine Cooper, pt not a candidate.

## 2017-08-25 NOTE — Progress Notes (Signed)
   2017-08-10 1115  Clinical Encounter Type  Visited With Patient and family together  Visit Type Other (Comment) ([place] consult)  Spiritual Encounters  Spiritual Needs Prayer;Emotional  Stress Factors  Patient Stress Factors Major life changes  Family Stress Factors Family relationships  Introduction to family as Pt intubated. Offered prayer of comfort and peace.

## 2017-08-25 NOTE — Progress Notes (Signed)
Pt arrived to unit at approx 1500. DNR. Upon assessment no pulse, no respiratios, unresponsive, pupils are fixed and dilated, hypothermia and generalized cyanosis. , next of kin present, notified MD. Time of death2018-08-27 1508. [facility] notified, family at bedside with belongings.

## 2017-08-25 DEATH — deceased

## 2018-09-25 IMAGING — CT CT RENAL STONE PROTOCOL
2 of 4 series · 17 of 46 positions shown, 19 images · non-contrast
Comparison: None.

CLINICAL DATA: Patient with abdominal pain.

EXAM:
CT ABDOMEN AND PELVIS WITHOUT CONTRAST
TECHNIQUE: Multidetector CT imaging of the abdomen and pelvis was performed
following the standard protocol without IV contrast.

[Series 3: renal stone 5.0 · axial · 0.82mm/px · z∈[+664,+1029]mm · 14 of 81 slices shown, 16 images]
[im 4/81  soft-tissue]
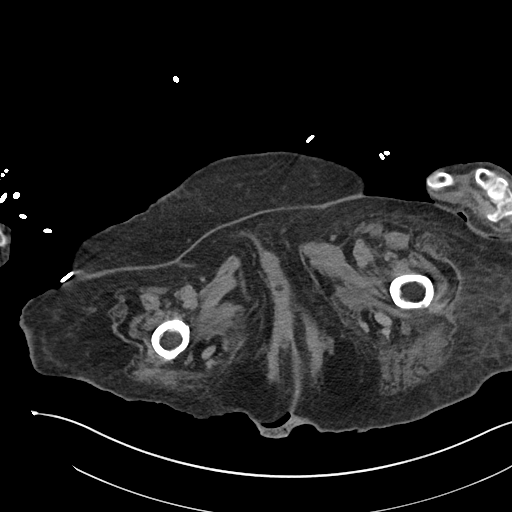
[im 4/81  bone]
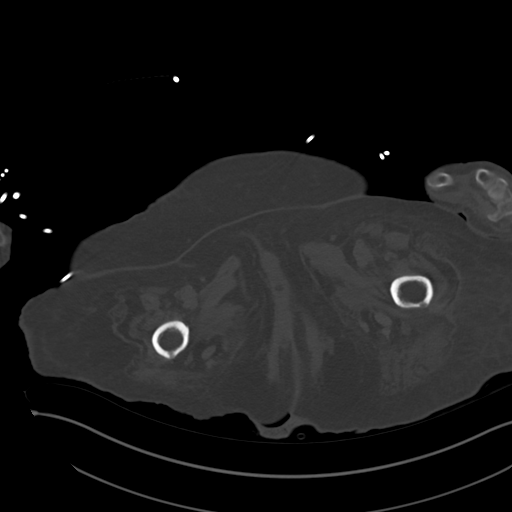
[im 11/81  soft-tissue]
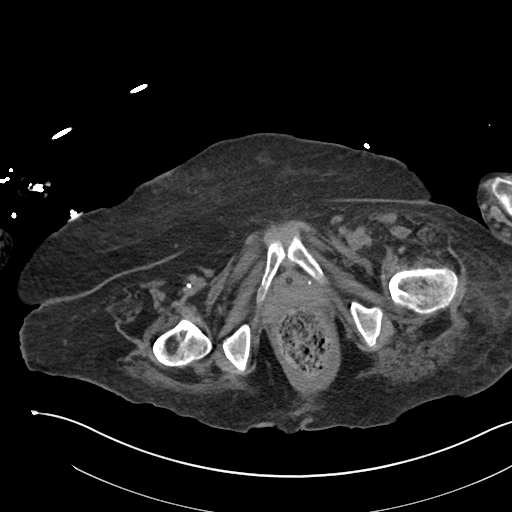
[im 15/81  soft-tissue]
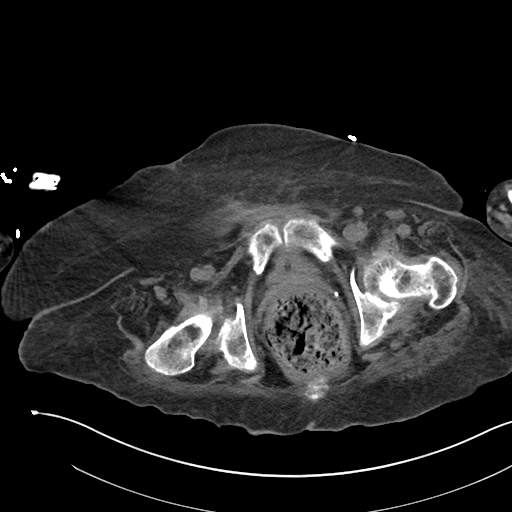
[im 22/81  soft-tissue]
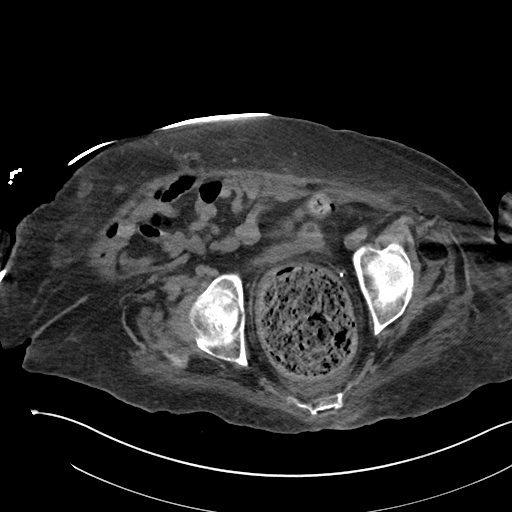
[im 26/81  soft-tissue]
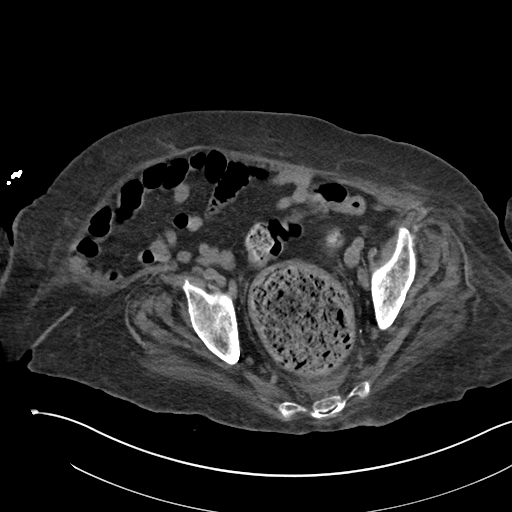
[im 33/81  soft-tissue]
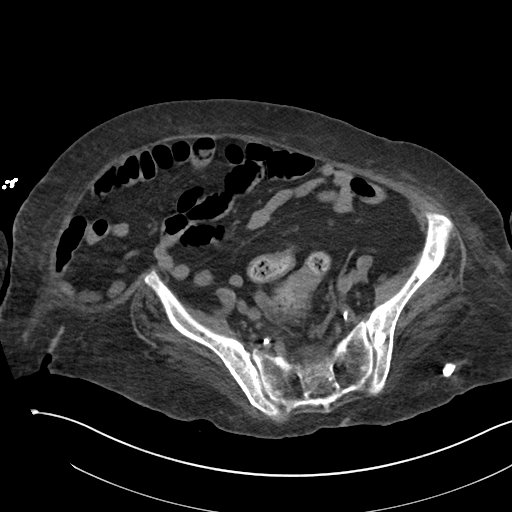
[im 37/81  soft-tissue]
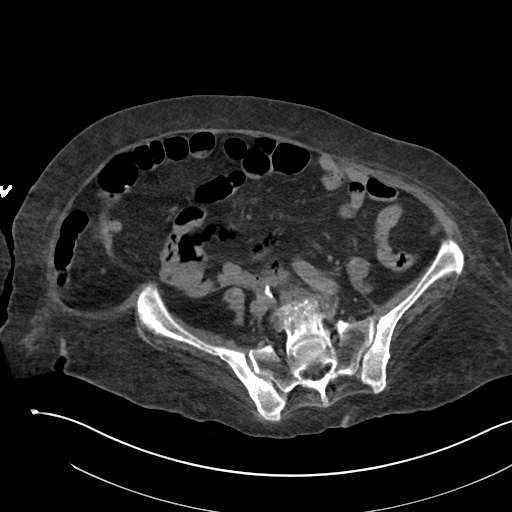
[im 44/81  soft-tissue]
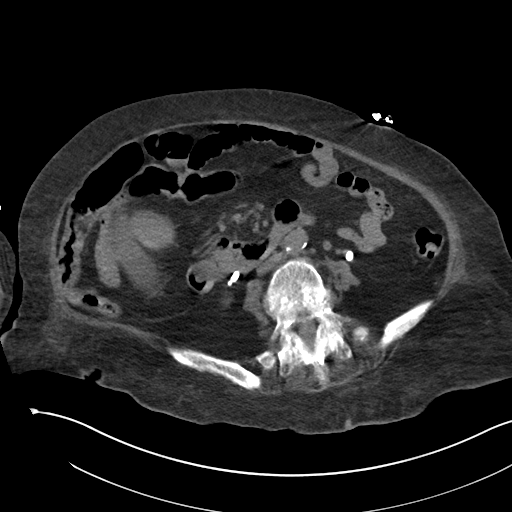
[im 48/81  soft-tissue]
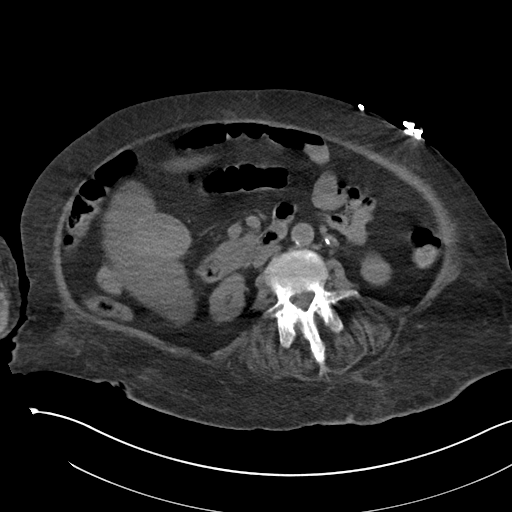
[im 48/81  bone]
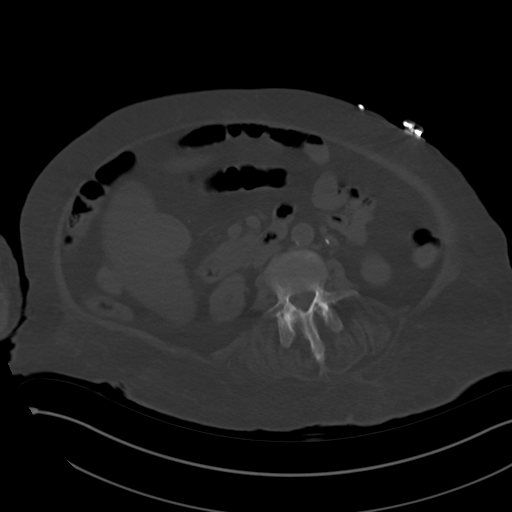
[im 55/81  soft-tissue]
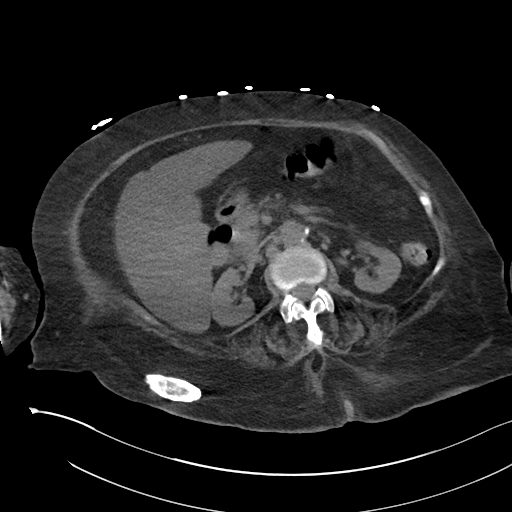
[im 59/81  soft-tissue]
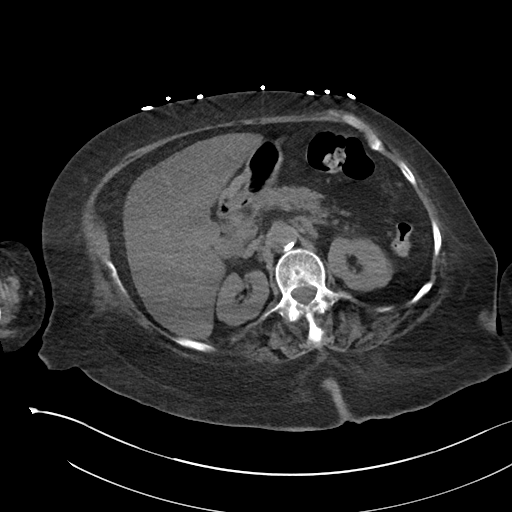
[im 66/81  soft-tissue]
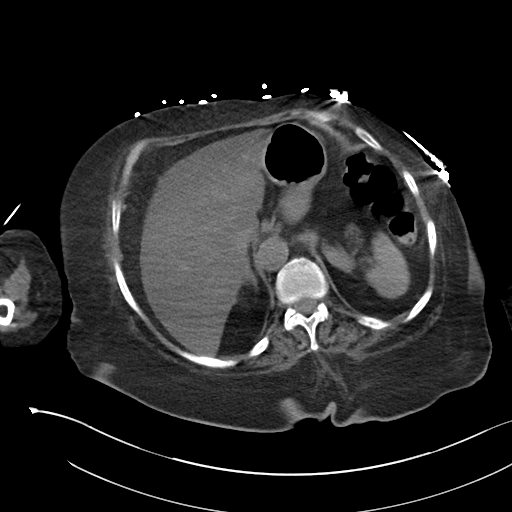
[im 70/81  soft-tissue]
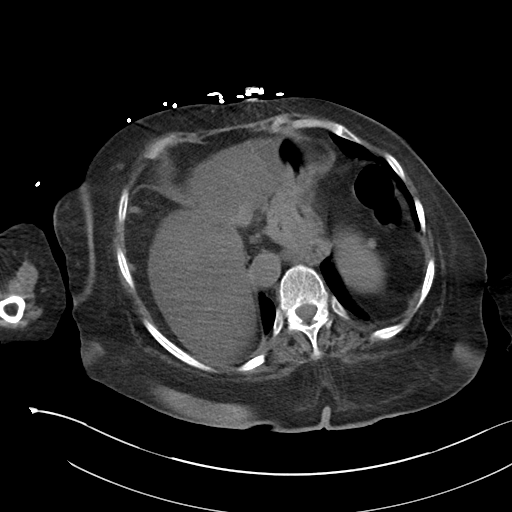
[im 77/81  soft-tissue]
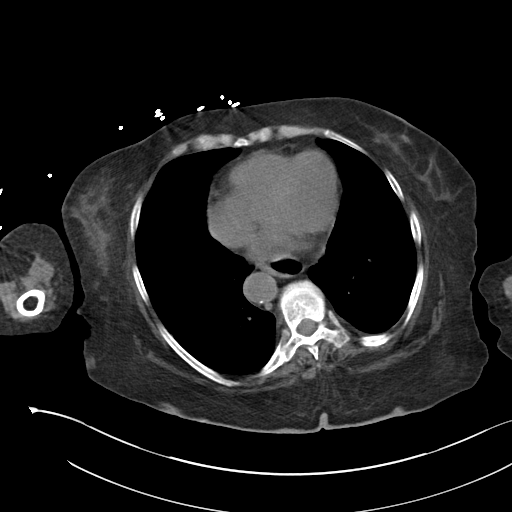

[Series 5: renal stone 3.0 cor · coronal · 0.80mm/px · 3 of 92 slices shown]
[im 31/92  soft-tissue]
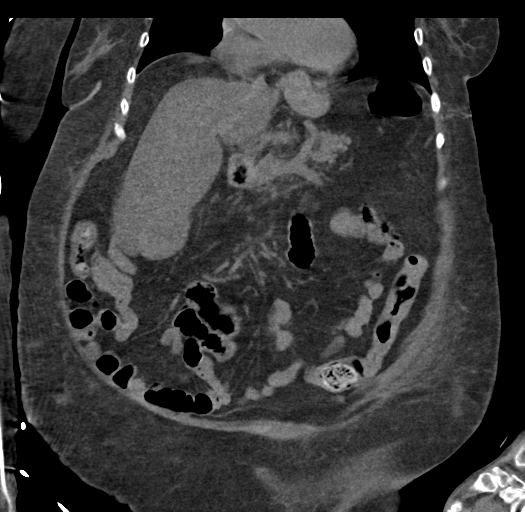
[im 41/92  soft-tissue]
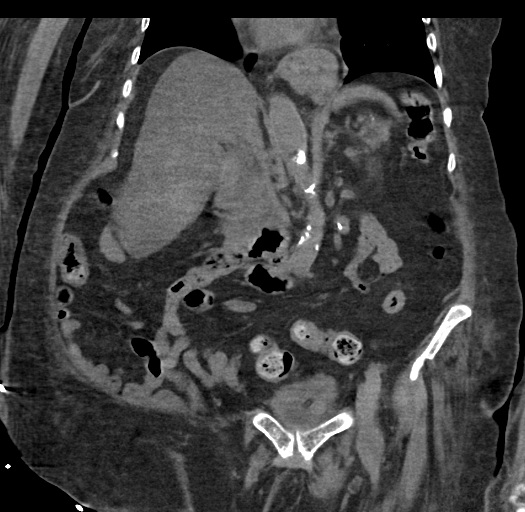
[im 51/92  soft-tissue]
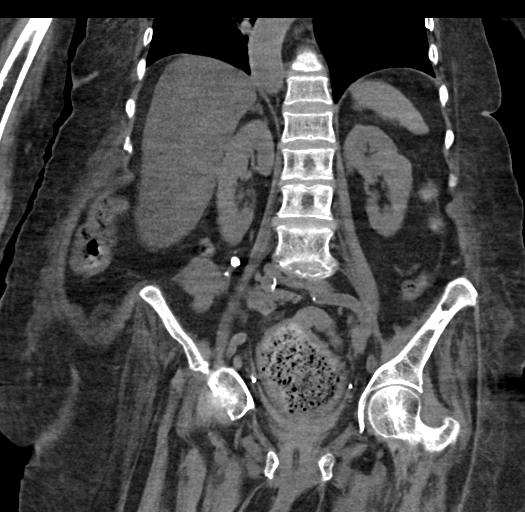

[17 of 46 positions shown; findings below may reference images not displayed]

FINDINGS: Lower chest: Normal heart size. Minimal atelectasis within the lung
bases. Moderate size hiatal hernia.

Hepatobiliary: Liver is diffusely low in attenuation compatible with
steatosis. High attenuation within the gallbladder lumen most
compatible with sludge/stones. No surrounding inflammatory change.
No intrahepatic or extrahepatic biliary ductal dilatation.

Pancreas: Unremarkable.  No surrounding inflammatory change.

Spleen: Unremarkable

Adrenals/Urinary Tract: The adrenal glands are normal in appearance.
Kidneys are symmetric in size. No hydronephrosis. Urinary bladder is
decompressed with a Foley catheter.

Stomach/Bowel: Large amount of stool within the rectum. Normal
appendix. No evidence for small bowel obstruction. No free fluid or
free intraperitoneal air.

Vascular/Lymphatic: Normal caliber abdominal aorta. Peripheral
calcified atherosclerotic plaque. No retroperitoneal
lymphadenopathy.

Reproductive: Status post hysterectomy.

Other: None.

Musculoskeletal: No aggressive or acute appearing osseous lesions.
Lumbar spine degenerative changes. Similar-appearing sclerotic
lesions within the left ilium suggestive of bone islands.
IMPRESSION: Large amount of stool within the rectum as can be seen with
impaction.

Moderate hiatal hernia.

Hepatic steatosis.

High attenuation within the gallbladder lumen most suggestive of
stones/sludge.
